# Patient Record
Sex: Male | Born: 1935 | Race: White | Hispanic: No | Marital: Married | Smoking: Former smoker
Health system: Southern US, Community
[De-identification: ages and names within clinical notes are randomized; demographics above are authoritative.]

## PROBLEM LIST (undated history)

## (undated) DIAGNOSIS — C4490 Unspecified malignant neoplasm of skin, unspecified: Secondary | ICD-10-CM

## (undated) DIAGNOSIS — E782 Mixed hyperlipidemia: Secondary | ICD-10-CM

## (undated) DIAGNOSIS — E7800 Pure hypercholesterolemia, unspecified: Secondary | ICD-10-CM

## (undated) DIAGNOSIS — Z9289 Personal history of other medical treatment: Secondary | ICD-10-CM

## (undated) DIAGNOSIS — M1990 Unspecified osteoarthritis, unspecified site: Secondary | ICD-10-CM

## (undated) DIAGNOSIS — M109 Gout, unspecified: Secondary | ICD-10-CM

## (undated) DIAGNOSIS — I779 Disorder of arteries and arterioles, unspecified: Secondary | ICD-10-CM

## (undated) DIAGNOSIS — I10 Essential (primary) hypertension: Secondary | ICD-10-CM

## (undated) DIAGNOSIS — I219 Acute myocardial infarction, unspecified: Secondary | ICD-10-CM

## (undated) DIAGNOSIS — I739 Peripheral vascular disease, unspecified: Secondary | ICD-10-CM

## (undated) DIAGNOSIS — K219 Gastro-esophageal reflux disease without esophagitis: Secondary | ICD-10-CM

## (undated) DIAGNOSIS — I2510 Atherosclerotic heart disease of native coronary artery without angina pectoris: Secondary | ICD-10-CM

## (undated) DIAGNOSIS — H9190 Unspecified hearing loss, unspecified ear: Secondary | ICD-10-CM

## (undated) DIAGNOSIS — R001 Bradycardia, unspecified: Secondary | ICD-10-CM

## (undated) HISTORY — DX: Essential (primary) hypertension: I10

## (undated) HISTORY — DX: Atherosclerotic heart disease of native coronary artery without angina pectoris: I25.10

## (undated) HISTORY — DX: Bradycardia, unspecified: R00.1

## (undated) HISTORY — DX: Mixed hyperlipidemia: E78.2

## (undated) HISTORY — DX: Peripheral vascular disease, unspecified: I73.9

## (undated) HISTORY — DX: Disorder of arteries and arterioles, unspecified: I77.9

## (undated) HISTORY — PX: APPENDECTOMY: SHX54

---

## 1999-11-07 ENCOUNTER — Encounter: Payer: Self-pay | Admitting: Cardiothoracic Surgery

## 1999-11-07 ENCOUNTER — Inpatient Hospital Stay (HOSPITAL_COMMUNITY): Admission: AD | Admit: 1999-11-07 | Discharge: 1999-11-14 | Payer: Self-pay | Admitting: Cardiology

## 1999-11-09 ENCOUNTER — Encounter: Payer: Self-pay | Admitting: Cardiothoracic Surgery

## 1999-11-09 HISTORY — PX: CORONARY ARTERY BYPASS GRAFT: SHX141

## 1999-11-10 ENCOUNTER — Encounter: Payer: Self-pay | Admitting: Cardiothoracic Surgery

## 1999-11-11 ENCOUNTER — Encounter: Payer: Self-pay | Admitting: Cardiothoracic Surgery

## 1999-11-12 ENCOUNTER — Encounter: Payer: Self-pay | Admitting: Cardiothoracic Surgery

## 2005-04-26 ENCOUNTER — Ambulatory Visit: Payer: Self-pay | Admitting: Cardiology

## 2005-04-30 ENCOUNTER — Ambulatory Visit: Payer: Self-pay | Admitting: *Deleted

## 2005-05-13 ENCOUNTER — Ambulatory Visit: Payer: Self-pay | Admitting: Cardiology

## 2005-05-18 ENCOUNTER — Encounter: Payer: Self-pay | Admitting: Cardiology

## 2005-06-16 ENCOUNTER — Ambulatory Visit: Payer: Self-pay | Admitting: Cardiology

## 2006-07-01 ENCOUNTER — Ambulatory Visit: Payer: Self-pay | Admitting: Cardiology

## 2007-01-04 ENCOUNTER — Ambulatory Visit: Payer: Self-pay | Admitting: Cardiology

## 2007-11-14 ENCOUNTER — Encounter: Payer: Self-pay | Admitting: Cardiology

## 2007-11-20 ENCOUNTER — Encounter: Payer: Self-pay | Admitting: Physician Assistant

## 2007-11-20 ENCOUNTER — Ambulatory Visit: Payer: Self-pay | Admitting: Cardiology

## 2008-01-08 ENCOUNTER — Ambulatory Visit: Payer: Self-pay

## 2008-10-10 ENCOUNTER — Ambulatory Visit: Payer: Self-pay | Admitting: Cardiology

## 2008-10-10 ENCOUNTER — Encounter: Payer: Self-pay | Admitting: Physician Assistant

## 2008-10-17 ENCOUNTER — Encounter: Payer: Self-pay | Admitting: Cardiology

## 2008-10-17 ENCOUNTER — Ambulatory Visit: Payer: Self-pay | Admitting: Cardiology

## 2008-10-21 ENCOUNTER — Encounter: Payer: Self-pay | Admitting: Physician Assistant

## 2008-10-22 ENCOUNTER — Encounter: Payer: Self-pay | Admitting: Cardiology

## 2008-11-01 ENCOUNTER — Encounter: Payer: Self-pay | Admitting: Cardiology

## 2008-11-12 ENCOUNTER — Ambulatory Visit: Payer: Self-pay | Admitting: Cardiology

## 2008-11-25 ENCOUNTER — Encounter: Payer: Self-pay | Admitting: Physician Assistant

## 2009-02-24 ENCOUNTER — Encounter: Payer: Self-pay | Admitting: Cardiology

## 2009-03-05 DIAGNOSIS — I739 Peripheral vascular disease, unspecified: Secondary | ICD-10-CM | POA: Insufficient documentation

## 2009-03-05 DIAGNOSIS — I10 Essential (primary) hypertension: Secondary | ICD-10-CM | POA: Insufficient documentation

## 2009-03-05 DIAGNOSIS — E782 Mixed hyperlipidemia: Secondary | ICD-10-CM | POA: Insufficient documentation

## 2009-03-05 DIAGNOSIS — I2510 Atherosclerotic heart disease of native coronary artery without angina pectoris: Secondary | ICD-10-CM | POA: Insufficient documentation

## 2009-12-03 ENCOUNTER — Ambulatory Visit: Payer: Self-pay | Admitting: Cardiology

## 2009-12-03 DIAGNOSIS — R42 Dizziness and giddiness: Secondary | ICD-10-CM | POA: Insufficient documentation

## 2009-12-15 ENCOUNTER — Encounter: Payer: Self-pay | Admitting: Cardiology

## 2010-07-07 NOTE — Assessment & Plan Note (Signed)
Summary: 1 YR FU JUNE REMINDER-SRS   Visit Type:  Follow-up Primary Provider:  Woody Seller   History of Present Illness: the patient is a 76 year old male with history of coronary disease, status post coronary bypass grafting in 2001. Last year the patient had a stress test and had a fixed defect. There was no ischemia. Ejection fraction was 59%. The patient has been doing well. He denies any chest pain or shortness of breath. The patient does report dizziness when standing up quickly. It also an episode 6 weeks ago when he was working up and pulling out some plywood. He didn't eat breakfast that morning he felt dizzy and sick on his stomach. He felt like he was going to pass out. It was very hot that day he developed some nausea. After sat rested and drank something his symptoms resolve. Otherwise from a cardiovascular standpoint is stable. He denies any orthopnea PND or palpitations  Preventive Screening-Counseling & Management  Alcohol-Tobacco     Smoking Status: quit     Year Quit: 2001  Current Medications (verified): 1)  Isosorbide Mononitrate Cr 30 Mg Xr24h-Tab (Isosorbide Mononitrate) .... Take 1 Tablet By Mouth Once A Day 2)  Lisinopril 20 Mg Tabs (Lisinopril) .... One Tab Twice A Day Place On File, Pt. Will Finish Current Supply Then Request Refill. 3)  Simvastatin 40 Mg Tabs (Simvastatin) .... Take 1 Tablet By Mouth Every Night 4)  Nitrostat 0.4 Mg Subl (Nitroglycerin) .... Place One Tablet Under Tongue Every 5 Minutes For Severe Chest Pain, Not To Exceed 3 in 15 Min Time Frame 5)  Lisinopril 40 Mg Tabs (Lisinopril) .... Take 1 Tablet By Mouth Once A Day 6)  Aspir-Low 81 Mg Tbec (Aspirin) .... Take 1 Tablet By Mouth Once A Day  Allergies (verified): No Known Drug Allergies  Comments:  Nurse/Medical Assistant: The patient's medications and allergies were verballyreviewed with the patient and were updated in the Medication and Allergy Lists.  Past History:  Past Medical  History: Last updated: 03/05/2009 HYPERLIPIDEMIA-MIXED (ICD-272.4) HYPERTENSION, UNSPECIFIED (ICD-401.9) PVD (ICD-443.9) CAD, NATIVE VESSEL (ICD-414.01)    Social History: Last updated: 03/05/2009 Retired  Married  Tobacco Use - Former.   Review of Systems       The patient complains of dizziness.  The patient denies fatigue, malaise, fever, weight gain/loss, vision loss, decreased hearing, hoarseness, chest pain, palpitations, shortness of breath, prolonged cough, wheezing, sleep apnea, coughing up blood, abdominal pain, blood in stool, nausea, vomiting, diarrhea, heartburn, incontinence, blood in urine, muscle weakness, joint pain, leg swelling, rash, skin lesions, headache, fainting, depression, anxiety, enlarged lymph nodes, easy bruising or bleeding, and environmental allergies.    Vital Signs:  Patient profile:   76 year old male Height:      68 inches Weight:      146 pounds BMI:     22.28 Pulse rate:   52 / minute Pulse (ortho):   61 / minute BP sitting:   119 / 53  (left arm) BP standing:   120 / 62 Cuff size:   regular  Vitals Entered By: Georgina Peer (December 03, 2009 1:53 PM)  Serial Vital Signs/Assessments:  Time      Position  BP       Pulse  Resp  Temp     By 2:57 PM   Lying RA  111/55   58                    Lovina Reach, LPN [id number] PM  Sitting   111/54   42                    [address] LPN [id number] PM   Standing  120/62   61                    [place], LPN  Comments: [id number] PM after 2 min:  117/62  61 after 5 min:  117/64  64 By: Lovina Reach, LPN    Physical Exam  Additional Exam:  General: Well-developed, well-nourished in no distress head: Normocephalic and atraumatic eyes PERRLA/EOMI intact, conjunctiva and lids normal nose: No deformity or lesions mouth normal dentition, normal posterior pharynx neck: Supple, no JVD.  No masses, thyromegaly or abnormal cervical nodes. Right carotid bruit. lungs: Normal breath sounds bilaterally without wheezing.   Normal percussion heart: regular rate and rhythm with normal S1 and S2, no S3 or S4.  PMI is normal.  No pathological murmurs abdomen: Normal bowel sounds, abdomen is soft and nontender without masses, organomegaly or hernias noted.  No hepatosplenomegaly musculoskeletal: Back normal, normal gait muscle strength and tone normal pulsus: Pulse is normal in all 4 extremities Extremities: No peripheral pitting edema neurologic: Alert and oriented x 3 skin: Intact without lesions or rashes cervical nodes: No significant adenopathy psychologic: Normal affect    Impression & Recommendations:  Problem # 1:  CAD, NATIVE VESSEL (ICD-414.01) no recurrent chest pain. Patient was stable regimen. No indication for stress testing currently His updated medication list for this problem includes:    Isosorbide Mononitrate Cr 30 Mg Xr24h-tab (Isosorbide mononitrate) .Marland Kitchen... Take 1 tablet by mouth once a day    Lisinopril 20 Mg Tabs (Lisinopril) ..... One tab twice a day place on file, pt. will finish current supply then request refill.    Nitrostat 0.4 Mg Subl (Nitroglycerin) .Marland Kitchen... Place one tablet under tongue every 5 minutes for severe chest pain, not to exceed 3 in 15 min time frame    Lisinopril 40 Mg Tabs (Lisinopril) .Marland Kitchen... Take 1 tablet by mouth once a day    Aspir-low 81 Mg Tbec (Aspirin) .Marland Kitchen... Take 1 tablet by mouth once a day  Orders: EKG w/ Interpretation (93000)  Problem # 2:  PVD (ICD-443.9) patient is a right carotid bruit. He had thought was a year ago.  Problem # 3:  HYPERLIPIDEMIA-MIXED (ICD-272.4)  patient will need a lipid panel and LFTs. The following medications were removed from the medication list:    Crestor 10 Mg Tabs (Rosuvastatin calcium) .Marland Kitchen... 1 tablet by mouth once a day His updated medication list for this problem includes:    Simvastatin 40 Mg Tabs (Simvastatin) .Marland Kitchen... Take 1 tablet by mouth every night  Orders: T-Lipid Profile [id number]) T-Hepatic Function  [id number])  Problem # 4:  ORTHOSTATIC DIZZINESS (ICD-780.4) we'll check orthostatics in the office today. I did encourage the patient to drink more fluids.  Patient Instructions: 1)  Labs:  FLP & LFT 2)  Follow up in  1 year

## 2010-10-20 NOTE — Assessment & Plan Note (Signed)
[place] OFFICE NOTE   [name], [name]                        MRN:          [id number]  DATE:11/20/2007                            DOB:          [birth year]    PRIMARY CARDIOLOGIST:  Ernestine Mcmurray, MD, [facility]   REASON FOR VISIT:  One-year followup.   Billy [name] continues to do well since undergoing multivessel CABG in  2001.  Moreover, he has not had any further exertional chest discomfort,  since placed on isosorbide mononitrate, by Dr. Dannielle Burn.  Although, he had  been taking only 15 mg of this, secondary to significant headaches, he  just increased it to 30 mg 1 week ago.  He states that he is tolerating  this quite well, with no recurrent headaches.   The patient remains quite active outside of his home.  In addition to  not experiencing any exertional chest discomfort, he also denies any  intermittent claudication.   The patient has not smoked since undergoing CABG surgery.   Most recent lipid profile in March 2009 revealed total cholesterol 135,  triglyceride 145, HDL 24, and LDL 82, the latter previously 89.   Electrocardiogram today reveals NSR at 64 bpm with normal axis and no  ischemic changes.   MEDICATIONS:  1. Full-dose aspirin.  2. Atenolol 25 mg daily.  3. Lisinopril 20 mg daily.  4. Isosorbide 30 mg daily.  5. Crestor 10 mg daily.  6. Diclofenac 75 mg daily.  7. KCl 20 mEq daily.  8. Fish oil 1 tablet daily.   PHYSICAL EXAMINATION:  VITAL SIGNS:  Blood pressure 138/65, pulse 54 and  regular, and weight 143.  GENERAL:  A 76 year old male, sitting upright, in no distress.  HEENT:  Normocephalic and atraumatic.  NECK:  Palpable bilateral carotid pulses; bilateral bruits.  LUNGS:  Clear to auscultation in all fields.  HEART:  Regular rate and rhythm (S1,S2).  No significant murmurs.  ABDOMEN:  Soft, nontender with positive bowel sounds.  There are  bilateral upper quadrant and  midepigastric bruits.  EXTREMITIES:  Palpable peripheral pulses without edema.  NEURO:  No focal deficit.   IMPRESSION:  1. Multivessel coronary artery disease.      a.     Five-vessel coronary artery bypass graft in June 2001:  Left       internal mammary artery - left anterior descending; saphenous vein       graft - DX; saphenous vein graft-obtuse marginal artery 1-obtuse,       marginal artery 2; saphenous vein graft-posterior descending       artery [facility]).      b.     Status post non-ST-elevation myocardial infarction.      c.     Preserved left ventricular ejection fraction.      d.     Mild inferior wall partial reversibility; ejection fraction       50%, by adenosine Cardiolite, November 2006.  2. Nonobstructive cerebrovascular disease.  3. Hypertension, stable.  4. Asymptomatic sinus bradycardia.  5. History of tobacco.  6. Mixed dyslipidemia.  7. Probable peripheral vascular disease.      a.     Diffuse abdominal bruits.   PLAN:  1. Abdominal and renal ultrasound with Dopplers, to exclude abdominal      aortic aneurysm and/or renal artery stenosis.  These studies will      be scheduled in our [facility]  2. Increase fish oil to 1 tablet twice daily, for more aggressive      management of mixed dyslipidemia.  3. Followup fasting lipid/liver profile in 12 weeks.  4. Increase aspirin initially to 162 mg daily, then 81 mg      indefinitely.  5. Schedule return clinic followup with myself and Dr. Dannielle Burn in 6      months.  We will consider a surveillance stress perfusion imaging      study at that time, given that it will be nearly 3 years since his      previous study.  We may also consider repeating carotid Dopplers,      as well.      Billy Serpe, PA-C  Electronically Signed      Ernestine Mcmurray, MD,FACC  Electronically Signed   GS/MedQ  DD: 11/20/2007  DT: 11/21/2007  Job #: [id number]   cc:   Jerene Bears, MD

## 2010-10-20 NOTE — Assessment & Plan Note (Signed)
[place] OFFICE NOTE   LONNEL, Billy [name]                        MRN:          [id number]  DATE:11/12/2008                            DOB:          September 19, 1934    REFERRING PHYSICIAN:  Jerene Bears, MD   HISTORY OF PRESENT ILLNESS:  The patient is a 76 year old male with  history of coronary artery disease, status post coronary bypass grafting  and peripheral arterial disease.  The patient underwent stress testing  recently which demonstrated predominantly fixed inferior defect.  There  was only very small amount of peri-infarct ischemia.   IDENTIFICATION:  Ejection fraction was 59%.  The patient is asymptomatic  and denies any chest pain.  This was a surveillance.  Nuclear study  ordered by Mr. Mannie Stabile.  The patient also had carotid Dopplers done  which shows less than 50% bilateral stenosis.  The patient's lipid panel  was also reviewed which showed an LDL of 92, HDL of 22, and cholesterol  of 132 with triglyceride markedly improved to 92 after fish oil  administration.  The patient states that he is asymptomatic.  He has no  chest pain, shortness of breath, orthopnea, PND, palpitations, or  syncope.   MEDICATIONS:  1. Multivitamin 1 tablet p.o. daily.  2. Atenolol 25 mg p.o. daily.  3. Lisinopril 20 mg p.o. b.i.d.  4. Isosorbide 30 mg p.o. daily.  5. Aspirin 81 mg p.o. daily.  6. Simvastatin 40 mg p.o. daily.  7. Fish oil 1 g p.o. b.i.d.  8. Saw palmetto 450 mg 2 tablets p.o. b.i.d.   PHYSICAL EXAMINATION:  VITAL SIGNS:  Blood pressure 165/69, heart rate  46, weight is 148 pounds.  NECK:  Normal carotid upstroke and no carotid bruits.  LUNGS:  Clear breath sounds bilaterally.  HEART:  Regular rate and rhythm with normal S1 and S2.  No murmurs,  rubs, or gallops.  ABDOMEN:  Soft, nontender.  No rebound or guarding.  Good bowel sounds.  EXTREMITIES:  No cyanosis, clubbing, or edema.  NEURO:  The  patient is alert, oriented, and grossly nonfocal.   PROBLEM LIST:  1. Multivessel coronary artery disease.      a.     Status post non-STEMI/5-vessel coronary artery bypass graft       in 2001.      b.     Preserved left ventricular function.      c.     Recent nuclear perfusion study with a inferior defect, but       no definite ischemia.  Ejection fraction preserved.  2. Peripheral arterial disease without claudication.  3. Hypertension, controlled.  4. Mixed dyslipidemia.  5. History of tobacco use.   PLAN:  1. The patient is doing well.  We encouraged again him to stop      smoking.  2. The patient will follow up liver function test done in several      weeks.  3. From a cardiovascular standpoint, the patient appears to be stable,      and I made otherwise no changes  in medical regimen.     Ernestine Mcmurray, MD,FACC  Electronically Signed    GED/MedQ  DD: 11/12/2008  DT: 11/13/2008  Job #: [id number]   cc:   Jerene Bears, MD

## 2010-10-20 NOTE — Assessment & Plan Note (Signed)
[facility]                          [facility] OFFICE NOTE   JOEVANI, MATCZAK                        MRN:          [id number]  DATE:10/10/2008                            DOB:          1934-11-17    PRIMARY CARDIOLOGIST:  Ernestine Mcmurray, MD, [facility]   REASON FOR VISIT:  A 4-month followup.   HISTORY OF PRESENT ILLNESS:  Mr. Dimasi continues to do well, since  undergoing multivessel CABG 2001.  Moreover, he has not had any further  exertional chest discomfort, since being placed on isosorbide  mononitrate, by Dr. Dannielle Burn, over 2 years ago.   Mr. Pearson remains quite active, playing golf some 3 times a week.  He  denies any associated chest discomfort.   Mr. Melrose has not smoked since undergoing bypass surgery.   The patient does have known peripheral vascular disease and does suggest  some mild intermittent claudication, but this is not lifestyle limiting.  When I last saw him in the clinic, I referred him for surveillance  ultrasound of the abdomen and the renal arteries.  This was completed in  our [facility], and did suggest a small infrarenal AAA (2.85 x  2.5 cm) with moderate amount of plaque.  There was also moderate  bilateral common iliac disease, and less than 59% left renal artery  stenosis, with no significant obstruction on the right renal artery.   The patient also has previously documented mild carotid artery disease,  last studied in 2006.   MEDICATIONS:  1. Aspirin 81 daily.  2. Simvastatin 40 at bedtime.  3. Fish oil 1 g b.i.d.  4. Isosorbide mononitrate 30 daily.  5. Lisinopril 20 daily.  6. Potassium chloride 20 daily.  7. Diclofenac 75 daily.  8. Atenolol 25 daily.   PHYSICAL EXAMINATION:  VITAL SIGNS:  Blood pressure 178/74, pulse 66,  regular and weight 150.  GENERAL:  A 76 year old male sitting upright, in no distress.  HEENT:  Normocephalic and atraumatic.  NECK:  Palpable bilateral carotid pulses with  bilateral bruits (right  greater left).  LUNGS:  Clear to auscultation all fields.  HEART:  Regular rate rhythm.  No significant murmurs.  ABDOMEN:  Soft, nontender with intact bowel sounds.  Diffuse bruits,  including both upper quadrants.  EXTREMITIES:  Palpable bilateral femoral pulses with bilateral bruits;  brisk bilateral posterior tibialis pulses.  No pedal edema.  NEUROLOGIC:  No focal deficit.   IMPRESSION:  1. Multivessel coronary artery disease.      a.     Status post NSTEMI/five-vessel CABG, June 2001.      b.     Preserved left ventricular function.  2. Peripheral arterial disease.  3. Hypertension, controlled.  4. Mixed dyslipidemia.  5. History of tobacco.   PLAN:  1. Schedule surveillance carotid Dopplers to rule out significant      progression, since his last study in 2006.  2. Schedule surveillance exercise stress Cardiolite, on medication, to      rule out significant progression since his previous study in 2006.      Of note, at  that time there was evidence of a partially reversible      defect in the inferior wall, which was felt to be mild.  3. Reassess lipid status with a fasting lipid/liver profile.  His last      study in June 2009 notable for an LDL of 86, HDL 22, triglyceride      258, and total cholesterol 160.  4. Increase lisinopril to 20 mg b.i.d., for more aggressive blood      pressure control.  5. Followup BMET in 1 week for monitoring of potassium and renal      function.  6. Renew prescription for p.r.n. Nitrostat.  7. Schedule return clinic follow up with myself and Dr. Dannielle Burn in one      month, for review of study results and further recommendations.  At      that time, we can then schedule for an annual abdominal ultrasound      with renal Dopplers, as recommended.      Gene Serpe, PA-C  Electronically Signed      Ernestine Mcmurray, MD,FACC  Electronically Signed   GS/MedQ  DD: 10/10/2008  DT: 10/11/2008  Job #: [id number]   cc:    Jerene Bears, MD

## 2010-10-20 NOTE — Assessment & Plan Note (Signed)
[place] OFFICE NOTE   PURNELL, HIJAZI                        MRN:          [id number]  DATE:01/04/2007                            DOB:          1934/07/08    HISTORY OF PRESENT ILLNESS:  The patient is a 76 year old male with  history of coronary artery disease, status post coronary artery bypass  grafting.  The patient reports chronic stable angina.  We placed him on  Imdur 30 mg last office visit; he has not been able to tolerate this  because of headaches; he cut it down to 15 mg, but states that it has  helped significantly and has not had to take any nitroglycerin.  The  patient states that he has some symptoms of orthostasis, but only when  he jumps up quickly out of a chair or from a bending position.  He has  no orthopnea or PND.   MEDICATIONS:  1. Saw palmetto.  2. Multivitamin.  3. Aspirin 325 mg daily.  4. Atenolol 25 mg p.o. daily.  5. Cyclobenzaprine 10 mg p.o. daily.  6. Isosorbide half a tablet 15 mg p.o. daily.  7. Lisinopril 20 mg p.o. daily.   PHYSICAL EXAMINATION:  VITAL SIGNS:  Blood pressure is 126/69.  Heart  rate is 51.  GENERAL:  A well-nourished white male in no apparent distress.  NECK:  Normal carotid upstroke.  No carotid bruit.  LUNGS:  Clear breath sounds bilaterally.  HEART:  Regular rate and rhythm.  Normal S1 and S2.  No murmurs, rubs,  or gallops.  ABDOMEN:  Soft and nontender.  EXTREMITIES:  No cyanosis, clubbing or edema.  NEUROLOGIC:  The patient is alert and oriented.  Grossly nonfocal.   PROBLEMS:  1. Coronary artery disease, status post coronary artery bypass      grafting in 2001.  2. Hypertension, controlled.  3. Dyslipidemia, patient off Crestor.  LDL approximately 100 mg%.  4. Asymptomatic carotid disease, less than 39% bilaterally.   PLAN:  1. The patient will need a stress test in approximately 1 year.  2. Followup carotid Dopplers can be  provided in 2 years.  3. The patient will have his lipid panel rechecked.  He stated that      this was done more recently.  I will review in the computer to see      if we have any more recent data.     Ernestine Mcmurray, MD,FACC  Electronically Signed    GED/MedQ  DD: 01/04/2007  DT: 01/05/2007  Job #: [id number]

## 2010-10-23 NOTE — Cardiovascular Report (Signed)
[place]  Patient:    Billy [name], Billy [name]                        MRN: [id number] Proc. Date: 11/06/99 Adm. Date:  [id number] Disc. Date: [id number] Attending:  Len Childs CC:         Carlena Bjornstad, M.D. [facility]                        Cardiac Catheterization  DATE OF BIRTH:  [birth year]  PROCEDURE:  Left heart catheterization/coronary arteriography.  CARDIOLOGIST:  Minus Breeding, M.D. [facility]  INDICATION:  Evaluate patient with known three-vessel coronary artery disease and ongoing chest discomfort (786.51).  PROCEDURAL NOTE:  Left heart catheterization was performed via the right femoral artery.  The artery was cannulated using anterior wall puncture.  A #6 French arterial sheath was inserted via the modified Seldinger technique. Preform Judkins and a pigtail catheter were utilized.  The patient tolerated the procedure well and left the lab in stable condition.  RESULTS:  HEMODYNAMICS:  LV: 117/10, AO: 113/58.  CORONARIES:  left main was normal.  The LAD had a proximal calcification before a large first diagonal.  There was proximal 75% stenosis after the first diagonal.  There was mid 60% stenosis.  There was 80% stenosis in the first septal perforator and 50% stenosis proximally in the large first diagonal.  The circumflex was occluded in the mid A-V groove.  There was occlusion at the ostium of the OM-1, occlusion at the ostium of the OM-2, and occlusion at the ostium of the OM-3.  All of these vessels filled with collaterals from the LAD.  The right coronary artery was a large dominant vessel.  There was mid, long, 50% stenosis with severe mid calcification.  LEFT VENTRICULOGRAM:  A left ventriculogram was obtained in the RAO projection.  The EF was 65% with normal wall motion.  CONCLUSION:  Severe two-vessel coronary artery disease with severe calcification.  Well preserved left ventricular function.  PLAN:  The patient will be  referred for CABG. DD:  12/23/99 TD:  12/24/99 Job: [id number]

## 2010-10-23 NOTE — Assessment & Plan Note (Signed)
[place] OFFICE NOTE   Billy [name], Billy [name]                        MRN:          [id number]  DATE:07/01/2006                            DOB:          Oct 17, 1934    HISTORY OF PRESENT ILLNESS:  The patient is a 76 year old male with  history of coronary artery disease, status post coronary bypass  grafting.  The patient has been doing well and reports rare episodes of  substernal chest pain over the last year.  He has used a couple of  nitroglycerins.  The patient actually is still very active and walks  quite a bit.  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  He is questioning whether he should discontinue Zetia today.  He does  have reports of muscle aches and pains with prior Zocor use.  He denies  any palpitations or syncope.  From a cardiovascular standpoint he is  otherwise stable.   CURRENT MEDICATIONS:  1. Zetia 10 mg a day.  2. Atenolol 25 mg a day.  3. Lisinopril 10 mg a day.  4. Lycopene and multivitamin.  5. Aspirin 325 mg p.o. a day.   PHYSICAL EXAMINATION:  VITAL SIGNS:  Blood pressure 150/68, heart rate  60 beats per minute, 179 pounds.  NECK:  Normal carotid upstroke, no carotid bruits.  LUNGS:  Clear, breath sounds bilaterally.  HEART:  Regular rate and rhythm, normal S1, S2, no murmur, rubs, or  gallops.  ABDOMEN:  Soft, nontender, no rebound or guarding, good bowel sounds.  EXTREMITIES:  No cyanosis, clubbing, or edema.   PROBLEMS:  1. Coronary artery disease, status post coronary artery bypass      grafting.  2. Hypertension, controlled.  3. Dyslipidemia.  4. Asymptomatic carotid disease, less than 39% bilaterally with last      Dopplers done in 2006.   PLAN:  1. The patient had a stress test done a year ago.  We do not need to      repeat this, he appears to have chronic stable angina.  I  did write      him for a prescription of Imdur 30 mg a day.  2. The patient is willing to try a different brand of statin, and we      will start him on Crestor.  He      will notify me if he has myalgias.  He does want to stop Zetia, and      I agreed with this plan.  3. The patient can follow up with [place] in 6 months.     Ernestine Mcmurray, MD,FACC  Electronically Signed    GED/MedQ  DD: 07/01/2006  DT: 07/01/2006  Job #: [id number]   cc:   Jerene Bears

## 2010-10-23 NOTE — Discharge Summary (Signed)
[place]. [facility]  Patient:    Billy [name], Billy [name]                        MRN: [id number] Adm. Date:  [id number] Disc. Date: 11/14/99 Attending:  Len Childs Dictator:   Earnstine Regal, P.A. CC:         Tharon Aquas Adelene Idler, M.D.             Carlena Bjornstad, M.D. [facility]                           Discharge Summary  DATE OF BIRTH:  11/25/1934  ADMISSION DIAGNOSES: 1. Known three-vessel coronary artery disease with recurrent chest pain. 2. Hypertension. 3. History of chronic obstructive pulmonary disease with heavy tobacco use. 4. Mild alcohol use. 5. Hyperlipidemia.  DISCHARGE DIAGNOSES: 1. Known three-vessel coronary artery disease with recurrent chest pain. 2. Hypertension. 3. History of chronic obstructive pulmonary disease with heavy tobacco use. 4. Mild alcohol use. 5. Hyperlipidemia. 6. Postoperative anemia secondary to acute blood loss during surgery.  PROCEDURES: 1. Cardiac catheterization November 06, 1999 by Dr. Percival Spanish. 2. Coronary artery bypass grafting x 5 with a left internal mammary to the    LAD, saphenous vein graft to posterior descending, saphenous vein graft to    OM1 and OM2 sequentially, saphenous vein graft to the diagonal November 09, 1999    by Dr. Prescott Gum.  BRIEF HISTORY:  The patient is a 76 year old white male medical patient of Dr. Dola Argyle and he sees him in the [place] clinic.  The patient is known to have three-vessel coronary artery disease from a heart catheterization January 21, 1993.  At that time, the patient had a 40-50% LAD.  Diagonal had a 30% stenosis, a 60% proximal diagonal, 40% proximal circumflex.  There was a 60% mid RCA stenosis.  Left main was normal.  There was segmental wall motion abnormality.  The patient was treated medically.  He had a recurrent study on Oct 15, 1999, which was negative for ischemia.  He returned to the [facility] clinic with a two- to three-week history of sudden onset of chest pain  in the mornings upon awakening, sometimes about 15 minutes after he gets up.  The pain was relieved with nitroglycerin.  He becomes mildly diaphoretic and mildly short of breath with the episodes.  He was seen by Dr. Ron Parker and Roque Cash, P.A.  EKG showed a heart rate at 53 with minimal ST elevation and nonspecific changes in the inferior leads.  He was subsequently admitted at this time for elective cardiac catheterization to evaluate his disease.  MEDICATIONS ON ADMISSION: 1. Nitroglycerin patch 0.4 mg q.d. 2. Norvasc 5 mg q.d. 3. Atenolol 25 mg 1/2 tablet q.d. 4. Enteric-coated aspirin 1 q.d. 5. Zocor 20 mg q.d.  For further history and physical, please see the dictated note.  HOSPITAL COURSE:  The patient was admitted on November 06, 1999 and underwent cardiac catheterization.  The LAD showed proximal calcification before the large first diagonal, which was 75%.  The circumflex had a 100% stenosis. OM1, 2, and 3 had 100% stenosis.  The RCA was dominant with a 50% stenosis. The patient was referred to CVTS and Dr. Tharon Aquas Trigt.  He saw the patient in consultation and agreed that coronary artery bypass grafting was the best treatment for his disease.  Preoperative studies showed no evidence  of ICA stenosis.  Vertebral artery flow was antegrade.  ABIs were greater than 1 bilaterally.  On November 09, 1999, he was taken to the operating room and underwent the procedure described above.  He tolerated it well.  He was transferred to the floor on the second postoperative day.  He was markedly anemic after his surgery with his hematocrit down to 22 and he was transfused with 1 unit of packed cells.  Since then, he showed good improvement.  He has undergone Phase I cardiac rehab and is ambulating 450 feet without difficulty. Saturations 96 and a heart rate up to 76 with walking.  Today, he is afebrile. His wounds look good.  He remains in a sinus rhythm.  Heart rate averages in the mid 50s.   Chest reveals a few rales in the bases but overall is doing well.  Sternum is stable.  We plan to discharge the patient home today.  DISCHARGE MEDICATIONS: 1. Coated aspirin 1 q.d. 2. Darvocet-N 100 1-2 p.o. q.4-6h. p.r.n. 3. Lopressor 50 mg 1/2 tablet q.12h. 4. Niferex 150 mg 1 q.d. 5. The patient may also resume his Zocor 20 mg q.d.  FOLLOW-UP:  He will return to see Dr. Ron Parker in two weeks on November 24, 1999 at 11:30 in the [facility].  He will return to see Dr. Prescott Gum in three weeks and our office will call and make that appointment.  DISCHARGE LABORATORY DATA:  His last hemoglobin on November 12, 1999 was 9.7 with a hematocrit of 27 and a white count of 8.9.  Platelets were 227,000. Electrolytes are normal.  BUN is 23, creatinine is 1.1, calcium 8.4.  His highest glucose was 189 and they are following this is in [place].  TSH was reported elevated initially but this was corrected and TSH level was 2.65.  CONDITION ON DISCHARGE:  Improved. DD:  11/14/99 TD:  11/14/99 Job: [id number]

## 2010-10-23 NOTE — Op Note (Signed)
[place]. [facility]  Patient:    Billy [name], Billy [name]                        MRN: [id number] Proc. Date: 11/09/99 Adm. Date:  [id number] Attending:  Len Childs CC:         CVTS Office             Bruce R. Olevia Perches, M.D. [facility]                           Operative Report  OPERATION:  Coronary artery bypass grafting x 5 (left internal mammary artery to left anterior descending, saphenous vein graft to diagonal, sequential saphenous graft to obtuse marginal 1 and obtuse marginal 2, saphenous vein graft to posterior descending).  PREOPERATIVE DIAGNOSES:  Class 4 unstable angina with non-Q-wave myocardial infarction, severe three-vessel coronary artery disease.  POSTOPERATIVE DIAGNOSES:  Class 4 unstable angina with non-Q-wave myocardial infarction, severe three-vessel coronary artery disease.  SURGEON:  Len Childs, M.D.  ASSISTANT:  Marcellus Scott, P.A.  ANESTHESIA:  General.  INDICATIONS:  The patient is a 76 year old male with known history of coronary disease, who presents with symptoms of unstable angina and ruled in with mildly positive myocardial enzymes.  He was stabilized on nitroglycerin patch and IV heparin and underwent cardiac catheterization, which demonstrated severe three-vessel coronary artery disease.  He was referred for surgical revascularization.  Prior to the operation, I examined the patient in his hospital room and discussed the results of the heart catheterization with the patient and wife.  I reviewed the indications and expected benefits of coronary bypass grafting.  I reviewed the alternatives to surgical therapy.  I discussed the major aspects of the operation, including the location of the surgical incisions, the use of cardiopulmonary bypass and general anesthesia, and expected hospital recovery.  I also discussed the risks associated with this operation, including the risks of MI, CVA, bleeding, infection, and death.  He  understood these implications for surgery and agreed to proceed with the operation as planned under informed consent.  OPERATIVE FINDINGS:  The saphenous vein was of average quality.  The mammary artery was small, approximately 1.5 mm, but had adequate flow.  Coronaries were severely diseased.  The ascending aorta was heavily diseased and calcified when the arch was cannulated.  The grafts were placed in a small central area of the ascending aorta that was without severe atherosclerosis. Any redo procedure would be at high risk due to the disease of the ascending aorta.  The distal circumflex vessel was too small to graft.  DESCRIPTION OF PROCEDURE:  The patient was brought to the operating room and placed supine on the operating room table, where general anesthesia was induced.  The chest, abdomen, and legs were prepped with Betadine and draped as a sterile field.  A median sternotomy was performed as the saphenous vein was harvested from the right lower extremity.  The left internal mammary artery was harvested as a pedicle graft from its origin at the subclavian vessels and was an adequate conduit.  Heparin was administered, and the ACT was documented as being therapeutic.  A pursestring was placed in the aortic arch and right atrium.  The patient was cannulated and placed on bypass and cooled to 32 degrees.  The coronaries were identified, and the mammary artery and saphenous veins were prepared for the coronary anastomoses.  A cardioplegia  cannula was placed, and his aortic crossclamp was carefully applied on a disease-free segment of the aorta, 500 cc of cold blood cardioplegia was delivered into the aortic root with immediate cardioplegic arrest and septal temperature dropping to less than 12 degrees.  Topical iced saline flush was used to augment myocardial preservation, and a pericardial insulator pad was used to protect the left phrenic nerve.  The distal coronary anastomoses  were then performed.  The first distal anastomosis was to the diagonal.  This is a 1.5 mm vessel, proximal 90% stenosis, and a reverse saphenous vein was sewn end-to-side with running 7-0 Prolene.  The second distal anastomosis was the sequential vein graft to the OM1 and OM2.  The OM1 was a 1.5 mm vessel with total proximal occlusion, and a side branch of the vein was sewn side-to-side with running 7-0 Prolene with good flow through the graft.  The third distal anastomosis was the continuation of the sequential vein graft to the OM2, which was a 1.2 mm vessel, proximal total occlusion.  The end of the vein was sewn end-to-side with running 7-0 Prolene, and there was a good flow through the sequential vein graft.  Cardioplegia was re-dosed.  The fourth distal anastomosis was to the posterior descending, which had a proximal 80-90% stenosis.  A reverse saphenous vein was sewn end-to-side with a running 7-0 Prolene, and there was a good flow through the graft.  The fifth distal anastomosis was to the mid-aspect of the LAD, which was diffusely diseased and was a 1.8 mm vessel with proximal 95% stenosis.  The left internal mammary artery pedicle was brought through an opening created in the left lateral pericardium, was brought down on the LAD, and sewn end-to-side with running 8-0 Prolene.  There was excellent flow through the anastomosis with immediate rise in septal temperature after release of the pedicle clamp on the mammary artery.  The mammary pedicle was secured to epicardium, and the aortic crossclamp was removed.  The heart was cardioverted back to a regular rhythm.  Very carefully, a partial occluding clamp was placed on the central portion of the ascending aorta that had no significant plaque.  Three proximal vein anastomoses were constructed using a 4.0 mm punch with running 6-0 Prolene.  The partial clamp was removed, and the vein grafts were inspected.  They were hemostatic  with excellent flow.  The patient was rewarmed to 37 degrees, and temporary pacing  wires were applied.  The lungs were re-expanded and the ventilator was turned back on.  The patient weaned from cardiopulmonary bypass without difficulty without inotropes.  Protamine was administered, and the cannula was removed. The mediastinum was irrigated with warm antibiotic irrigation, and hemostasis was adequate.  The leg incision was irrigated and closed in a standard fashion.  The pericardium was loosely reapproximated.  Two mediastinal and a left pleural chest tube were placed and brought out through separate incisions.  The sternum was reapproximated with interrupted steel wire, and the pectoralis and subcutaneous layers were closed with running Vicryl.  The skin was closed with staples, and sterile dressings were applied. Total cardiopulmonary bypass time was 120 minutes, with aortic crossclamp time of 60 minutes. DD:  11/09/99 TD:  11/12/99 Job: [id number]

## 2010-10-23 NOTE — Consult Note (Signed)
[place]. [facility]  Patient:    Billy [name], Billy [name]                        MRN: [id number] Proc. Date: 11/06/99 Adm. Date:  [id number] Attending:  Fatima Sanger CC:         [facility]                          Consultation Report  REASON FOR CONSULTATION:  Severe three-vessel coronary disease.  CHIEF COMPLAINT:  Chest pain.  HISTORY OF PRESENT ILLNESS:  Patient is a 76 year old white male with known history of coronary disease following cardiac catheterization at [facility] in 1994, who presents with progressive angina over the past two to three weeks which is described as typical squeezing in nature, associated at rest and with exertion.  It radiates across his chest to the left shoulder and is associated with some mild diaphoresis and shortness of breath.  The pain lasts typically 10 to 15 minutes and usually resolves with sublingual nitroglycerin.  Patient denies any symptoms of congestive heart failure, including orthopnea, PND or ankle edema.  He denies any history of syncope of palpitation.  The patient was evaluated in [facility] Emergency Room and was found to have mildly positive cardiac enzymes and was transferred to [facility].  He underwent cardiac catheterization today, which demonstrated severe three-vessel coronary disease with total occlusion of the circumflex, 90% stenosis of the LAD diagonal and 70% stenosis of the right coronary artery with well-preserved left ventricular function.  He was referred for surgical coronary revascularization due to his bad coronary anatomy and symptoms of unstable angina.  The patient is currently pain-free following cardiac catheterization and he remains on heparin protocol.  PAST MEDICAL HISTORY:  Patients history is positive for hypertension and peripheral vascular disease and known coronary artery disease.  He denies any prior surgical therapy.  MEDICATIONS ON  ADMISSION:  Nitroglycerin patch, Zocor, Tenormin, Norvasc and aspirin.  ALLERGIES:  He denies allergies to medications.  SOCIAL HISTORY:  The patient is retired and disabled from work at a [employer] in [place].  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  He is married and lives with his wife, Billy [name]. He does not currently smoke but does have a smoking history and he uses alcohol, approximately two to three drinks per two-week period.  FAMILY HISTORY:  Family history is without history of coronary artery disease or premature myocardial infarction.  REVIEW OF SYSTEMS:  Patient states his weight has been stable and he denies any fever, night sweats or significant weakness.  He denies any TIA, CVA, seizure or syncope.  His cardiac history is positive for angina but negative for symptoms of orthopnea or PND.  The pulmonary history is positive for a history of smoking but no smoking for four years and no productive cough, wheezing or history of recurrent upper respiratory infection.  His abdominal history is negative for blood per rectum, jaundice or gallstones.  His GU history is negative for hematuria or prostate symptoms.  Skin is negative for rash, lesion or history of free bleeding.  He denies any symptoms of depression, insomnia or decreased appetite.  Remainder of review of systems is negative.  PHYSICAL EXAMINATION:  GENERAL:  He is 5-feet 8-inches tall and weighs 148  pounds.  General appearance is that of a middle-aged white male in his hospital room following cardiac catheterization, without chest pain and without stress.  VITAL SIGNS:  His blood pressure is 110/57 with a pulse of 60 per minute, in sinus rhythm.  HEENT:  Full EOMs, clear pharynx, poor dentition.  NECK:  Supple without thyromegaly, JVD, carotid bruit or mass.  LUNGS:  Clear breath sounds bilaterally and there no thoracic deformities.  CARDIAC:  Regular rate  and rhythm with normal S1 and S2 without gallop or murmur.  ABDOMEN:  Soft and nontender, without organomegaly, pulsatile mass and with normal bowel sounds.  EXTREMITIES:  Good range of motion.  There is no lower extremity edema or evidence of venous insufficiency.  There are no tender swollen joints.  He does have some tenderness over his lower lumbar spine.  VASCULAR:  Pulses 2+ in radials and pedal pulses.  SKIN:  Clear without rash and his lower extremity hair distribution is normal.  NEUROLOGIC:  Alert and oriented x 3 with full motor function.  RECTAL:  Deferred.  LABORATORY DATA:  His coronary arteriograms are reviewed, as performed by Dr. Vanna Scotland. Brodie today, and reveal total occlusion of the circumflex with high-grade stenosis of the LAD diagonal and moderate stenosis of the right coronary artery.  IMPRESSION:  I agree with the recommendation for coronary revascularization due to his bad coronary anatomy and symptoms of unstable angina with mild bump in his cardiac enzymes.  We plan on grafting the left anterior descending diagonal, obtuse marginal #2 and obtuse marginal #3 and right coronary artery on Monday, November 09, 1999.  I discussed the plan and recommendation for surgery with the patient including the major aspects of the operation including the choice of conduit, the placement of surgical incisions and use of cardiopulmonary bypass and general anesthesia.  I reviewed the alternatives to surgical therapy as well as the risks involved.  The understands the expected hospital recovery and the followup care that will be required following leaving the hospital.  He agrees to proceed with the operation as scheduled for Monday, November 09, 1999.  ADDENDUM:  I will discuss the significance of an elevated thyroid-stimulating hormone with the medical service prior to surgery. DD:  11/06/99 TD:  11/07/99 Job: [id number]

## 2011-02-04 ENCOUNTER — Other Ambulatory Visit: Payer: Self-pay | Admitting: *Deleted

## 2011-02-04 MED ORDER — LISINOPRIL 20 MG PO TABS: 40.0000 mg | ORAL_TABLET | Freq: Every day | ORAL | Status: DC

## 2011-02-04 MED ORDER — ISOSORBIDE MONONITRATE ER 30 MG PO TB24: 30.0000 mg | ORAL_TABLET | Freq: Every day | ORAL | Status: DC

## 2011-02-04 MED ORDER — SIMVASTATIN 40 MG PO TABS: 40.0000 mg | ORAL_TABLET | Freq: Every evening | ORAL | Status: DC

## 2011-04-05 ENCOUNTER — Encounter: Payer: Self-pay | Admitting: Cardiology

## 2011-04-06 ENCOUNTER — Ambulatory Visit (INDEPENDENT_AMBULATORY_CARE_PROVIDER_SITE_OTHER): Payer: Medicare Other | Admitting: Cardiology

## 2011-04-06 ENCOUNTER — Encounter: Payer: Self-pay | Admitting: Cardiology

## 2011-04-06 VITALS — BP 160/80 | HR 62 | Ht 68.0 in | Wt 143.0 lb

## 2011-04-06 DIAGNOSIS — I2510 Atherosclerotic heart disease of native coronary artery without angina pectoris: Secondary | ICD-10-CM

## 2011-04-06 DIAGNOSIS — I739 Peripheral vascular disease, unspecified: Secondary | ICD-10-CM

## 2011-04-06 DIAGNOSIS — R0989 Other specified symptoms and signs involving the circulatory and respiratory systems: Secondary | ICD-10-CM

## 2011-04-06 DIAGNOSIS — E785 Hyperlipidemia, unspecified: Secondary | ICD-10-CM

## 2011-04-06 DIAGNOSIS — R42 Dizziness and giddiness: Secondary | ICD-10-CM

## 2011-04-06 NOTE — Patient Instructions (Signed)
Your physician wants you to follow-up in: 6 months. You will receive a reminder letter in the mail one-two months in advance. If you don't receive a letter, please call our office to schedule the follow-up appointment. Your physician recommends that you continue on your current medications as directed. Please refer to the Current Medication list given to you today. Your physician has requested that you have a carotid duplex. This test is an ultrasound of the carotid arteries in your neck. It looks at blood flow through these arteries that supply the brain with blood. Allow one hour for this exam. There are no restrictions or special instructions.

## 2011-04-07 ENCOUNTER — Other Ambulatory Visit: Payer: Self-pay | Admitting: *Deleted

## 2011-04-07 DIAGNOSIS — I779 Disorder of arteries and arterioles, unspecified: Secondary | ICD-10-CM | POA: Insufficient documentation

## 2011-04-07 DIAGNOSIS — Z79899 Other long term (current) drug therapy: Secondary | ICD-10-CM

## 2011-04-07 DIAGNOSIS — E785 Hyperlipidemia, unspecified: Secondary | ICD-10-CM

## 2011-04-07 NOTE — Assessment & Plan Note (Signed)
Lipid panel liver function tests were also ordered today.

## 2011-04-07 NOTE — Assessment & Plan Note (Signed)
Patient reports no chest pain. Continue risk factor modification and medical therapy

## 2011-04-07 NOTE — Progress Notes (Signed)
HPI: The patient is a 76 year old male with a history of coronary artery disease, status post coronary bypass grafting in 2001. Stress test was done 2 years ago demonstrating a fixed defect but no ischemia. Ejection fraction is 59%. He also has a history of orthostatic/postural dizziness. The patient does get up slowly. However this doesn't seem to be causing any significant problems. He denies any chest pain or shortness of breath. He has no orthopnea PND palpitations or syncope. He is doing well from a cardiovascular perspective  PMH: reviewed and listed in Problem List in Electronic Records (and see below)  Allergies/SH/FHX : available in Electronic Records for review  Medications: Current Outpatient Prescriptions on File Prior to Visit  Medication Sig Dispense Refill  . aspirin EC 81 MG tablet Take 81 mg by mouth daily.        . isosorbide mononitrate (IMDUR) 30 MG 24 hr tablet Take 1 tablet (30 mg total) by mouth daily.  90 tablet  0  . lisinopril (PRINIVIL,ZESTRIL) 20 MG tablet Take 2 tablets (40 mg total) by mouth daily.  180 tablet  0  . nitroGLYCERIN (NITROSTAT) 0.4 MG SL tablet Place 0.4 mg under the tongue every 5 (five) minutes as needed.        . simvastatin (ZOCOR) 40 MG tablet Take 1 tablet (40 mg total) by mouth every evening.  90 tablet  0    ROS: No nausea or vomiting. No fever or chills.No melena or hematochezia.No bleeding.No claudication  Physical Exam: BP 160/80  Pulse 62  Ht 5\' 8"  (1.727 m)  Wt 143 lb (64.864 kg)  BMI 21.74 kg/m2 General: Well-nourished white male Neck: Normal carotid upstroke with right carotid bruit, no thyromegaly nonnodular thyroid, JVP is 6 cm Lungs: Clear breath sounds bilaterally with no wheezing Cardiac: Regular rate and rhythm with normal S1-S2 no pathological murmurs Vascular: Normal dorsalis pedis and posterior tibial pulses bilaterally Skin: Warm and dry  12lead ECG: Normal sinus rhythm Q waves in V1 to V2 but no acute ischemic  changes Limited bedside ECHO:N/A

## 2011-04-07 NOTE — Assessment & Plan Note (Signed)
No evidence of significant peripheral vascular disease the patient has normal pulses on exam.

## 2011-04-07 NOTE — Assessment & Plan Note (Signed)
Patient was instructed to keep well-hydrated. Overall this doesn't appear to cause significant problems.

## 2011-04-07 NOTE — Assessment & Plan Note (Signed)
Carotid Dopplers were ordered.

## 2011-04-21 ENCOUNTER — Encounter: Payer: Medicare Other | Admitting: *Deleted

## 2011-04-22 ENCOUNTER — Encounter: Payer: Medicare Other | Admitting: *Deleted

## 2011-05-05 ENCOUNTER — Encounter: Payer: Medicare Other | Admitting: *Deleted

## 2011-05-31 ENCOUNTER — Other Ambulatory Visit: Payer: Self-pay | Admitting: *Deleted

## 2011-05-31 MED ORDER — LISINOPRIL 20 MG PO TABS: 40.0000 mg | ORAL_TABLET | Freq: Every day | ORAL | Status: DC

## 2011-05-31 MED ORDER — ISOSORBIDE MONONITRATE ER 30 MG PO TB24: 30.0000 mg | ORAL_TABLET | Freq: Every day | ORAL | Status: DC

## 2011-05-31 MED ORDER — SIMVASTATIN 40 MG PO TABS: 40.0000 mg | ORAL_TABLET | Freq: Every evening | ORAL | Status: DC

## 2012-04-03 DIAGNOSIS — R079 Chest pain, unspecified: Secondary | ICD-10-CM

## 2012-04-03 DIAGNOSIS — I498 Other specified cardiac arrhythmias: Secondary | ICD-10-CM

## 2012-04-04 ENCOUNTER — Other Ambulatory Visit: Payer: Self-pay | Admitting: Physician Assistant

## 2012-04-04 DIAGNOSIS — I2510 Atherosclerotic heart disease of native coronary artery without angina pectoris: Secondary | ICD-10-CM

## 2012-04-04 DIAGNOSIS — R079 Chest pain, unspecified: Secondary | ICD-10-CM

## 2012-04-05 DIAGNOSIS — R079 Chest pain, unspecified: Secondary | ICD-10-CM

## 2012-04-19 ENCOUNTER — Ambulatory Visit (INDEPENDENT_AMBULATORY_CARE_PROVIDER_SITE_OTHER): Payer: Medicare Other | Admitting: Physician Assistant

## 2012-04-19 ENCOUNTER — Encounter: Payer: Self-pay | Admitting: Physician Assistant

## 2012-04-19 VITALS — BP 153/80 | HR 64 | Ht 68.0 in | Wt 144.0 lb

## 2012-04-19 DIAGNOSIS — R001 Bradycardia, unspecified: Secondary | ICD-10-CM

## 2012-04-19 DIAGNOSIS — I498 Other specified cardiac arrhythmias: Secondary | ICD-10-CM

## 2012-04-19 DIAGNOSIS — I2510 Atherosclerotic heart disease of native coronary artery without angina pectoris: Secondary | ICD-10-CM

## 2012-04-19 DIAGNOSIS — R0989 Other specified symptoms and signs involving the circulatory and respiratory systems: Secondary | ICD-10-CM

## 2012-04-19 DIAGNOSIS — E785 Hyperlipidemia, unspecified: Secondary | ICD-10-CM

## 2012-04-19 DIAGNOSIS — E782 Mixed hyperlipidemia: Secondary | ICD-10-CM

## 2012-04-19 NOTE — Progress Notes (Signed)
Primary Cardiologist: Johnny Bridge, MD (new)   HPI: Post hospital followup from [facility], seen in consultation by Dr. Domenic Polite for atypical CP. Cardiac markers normal. Patient referred for outpatient evaluation with a Lexiscan Cardiolite, which was normal. He also presented in SB, with rates in the 40 bpm range. Atenolol dose was decreased to half dose, at discharge.  Clinically, he denies any exertional CP. He continues to have occasional, intermittent sharp chest pains in the right upper quadrant, of very brief duration, and unpredictable in onset. Also, he has felt considerably better since his atenolol dose was cut in half. Specifically, he reports increased exercise tolerance, and no further dizziness.  Not on File  Current Outpatient Prescriptions  Medication Sig Dispense Refill  . aspirin EC 81 MG tablet Take 81 mg by mouth daily.        Marland Kitchen COLCRYS 0.6 MG tablet Take 1 tablet by mouth Once daily as needed.      . isosorbide mononitrate (IMDUR) 30 MG 24 hr tablet Take 1 tablet (30 mg total) by mouth daily.  90 tablet  3  . lisinopril (PRINIVIL,ZESTRIL) 20 MG tablet Take 20 mg by mouth 2 (two) times daily.      . nitroGLYCERIN (NITROSTAT) 0.4 MG SL tablet Place 0.4 mg under the tongue every 5 (five) minutes as needed.        . simvastatin (ZOCOR) 40 MG tablet Take 1 tablet (40 mg total) by mouth every evening.  90 tablet  3  . Specialty Vitamins Products (PROSTATE) TABS Take 1 tablet by mouth daily.      . [DISCONTINUED] lisinopril (PRINIVIL,ZESTRIL) 20 MG tablet Take 2 tablets (40 mg total) by mouth daily.  180 tablet  3    Past Medical History  Diagnosis Date  . Other and unspecified hyperlipidemia   . Unspecified essential hypertension   . Peripheral vascular disease, unspecified   . Coronary atherosclerosis of native coronary artery   . Dizziness and giddiness   . History of tobacco abuse   . Sinus bradycardia     Asymptomatic  . Alcohol use     mild    Past Surgical History    Procedure Date  . Coronary artery bypass graft 11/09/1999    History   Social History  . Marital Status: Married    Spouse Name: N/A    Number of Children: N/A  . Years of Education: N/A   Occupational History  . DISABLED     [employer]   Social History Main Topics  . Smoking status: Former Smoker -- 0.5 packs/day for 50 years    Types: Cigarettes    Quit date: 06/08/1999  . Smokeless tobacco: Never Used  . Alcohol Use: No  . Drug Use: No  . Sexually Active: Not on file   Other Topics Concern  . Not on file   Social History Narrative  . No narrative on file    No family history on file.  ROS: no nausea, vomiting; no fever, chills; no melena, hematochezia; no claudication  PHYSICAL EXAM: BP 153/80  Pulse 64  Ht 5\' 8"  (1.727 m)  Wt 144 lb (65.318 kg)  BMI 21.90 kg/m2 GENERAL: 77 year old male; NAD HEENT: NCAT, PERRLA, EOMI; sclera clear; no xanthelasma NECK: palpable bilateral carotid pulses, soft, bilateral bruits; no JVD; no TM LUNGS: CTA bilaterally CARDIAC: RRR (S1, S2); no significant murmurs; no rubs or gallops ABDOMEN: soft, non-tender; intact BS EXTREMETIES: intact distal pulses; no significant peripheral edema SKIN: warm/dry; no obvious rash/lesions  MUSCULOSKELETAL: no joint deformity NEURO: no focal deficit; NL affect   EKG:    ASSESSMENT & PLAN:  CAD, NATIVE VESSEL No further workup indicated. Recent hospitalization with atypical CP, normal cardiac markers, and normal outpatient Lexiscan Cardiolite.  Bradycardia Atenolol to be discontinued. Patient has no current clinical indication for treatment with beta blocker. He recently presented with significant bradycardia, and has felt much better since atenolol dose was cut in half. Recommend increasing his ACE inhibitor dose, if needed, for uncontrolled HTN.  Carotid bruit Will order carotid Dopplers to rule out significant disease progression. Last study in May 2010 indicated less than 50%  bilateral ICA stenosis.  HYPERLIPIDEMIA-MIXED LDL 90 back in February of this year. Current simvastatin dose has remained unchanged. Patient cites prior intolerance to Lipitor. I recommended that he work more diligently in reducing his saturated fat intake, and we'll reassess lipid status in 12 weeks. If LDL still not at goal, then I will either increase simvastatin dose or switch to low-dose Crestor.    Gene Gillermo Poch, PAC

## 2012-04-19 NOTE — Assessment & Plan Note (Signed)
Atenolol to be discontinued. Patient has no current clinical indication for treatment with beta blocker. He recently presented with significant bradycardia, and has felt much better since atenolol dose was cut in half. Recommend increasing his ACE inhibitor dose, if needed, for uncontrolled HTN.

## 2012-04-19 NOTE — Assessment & Plan Note (Signed)
LDL 90 back in February of this year. Current simvastatin dose has remained unchanged. Patient cites prior intolerance to Lipitor. I recommended that he work more diligently in reducing his saturated fat intake, and we'll reassess lipid status in 12 weeks. If LDL still not at goal, then I will either increase simvastatin dose or switch to low-dose Crestor.

## 2012-04-19 NOTE — Assessment & Plan Note (Signed)
No further workup indicated. Recent hospitalization with atypical CP, normal cardiac markers, and normal outpatient Lexiscan Cardiolite.

## 2012-04-19 NOTE — Patient Instructions (Addendum)
   Stop Atenolol  Carotid Doppler  Lab due in 3 months for fasting lipid and liver panel - will mail reminder  Office will contact with results Continue all other current medications. Your physician wants you to follow up in:  1 year.  You will receive a reminder letter in the mail one-two months in advance.  If you don't receive a letter, please call our office to schedule the follow up appointment

## 2012-04-19 NOTE — Assessment & Plan Note (Signed)
Will order carotid Dopplers to rule out significant disease progression. Last study in May 2010 indicated less than 50% bilateral ICA stenosis.

## 2012-05-17 ENCOUNTER — Encounter (INDEPENDENT_AMBULATORY_CARE_PROVIDER_SITE_OTHER): Payer: Medicare Other

## 2012-05-17 DIAGNOSIS — R0989 Other specified symptoms and signs involving the circulatory and respiratory systems: Secondary | ICD-10-CM

## 2012-05-17 DIAGNOSIS — I2510 Atherosclerotic heart disease of native coronary artery without angina pectoris: Secondary | ICD-10-CM

## 2012-05-17 DIAGNOSIS — I6529 Occlusion and stenosis of unspecified carotid artery: Secondary | ICD-10-CM

## 2012-05-22 ENCOUNTER — Telehealth: Payer: Self-pay | Admitting: *Deleted

## 2012-05-22 NOTE — Telephone Encounter (Signed)
Message copied by Merlene Laughter on Mon May 22, 2012 10:51 AM ------      Message from: Donney Dice      Created: Fri May 19, 2012 12:01 PM       Mild carotid dz. F/u 1 year

## 2012-05-22 NOTE — Telephone Encounter (Signed)
Wife informed

## 2012-06-12 ENCOUNTER — Other Ambulatory Visit: Payer: Self-pay | Admitting: *Deleted

## 2012-06-13 ENCOUNTER — Other Ambulatory Visit: Payer: Self-pay | Admitting: *Deleted

## 2012-06-13 MED ORDER — ISOSORBIDE MONONITRATE ER 30 MG PO TB24: 30.0000 mg | ORAL_TABLET | Freq: Every day | ORAL | Status: DC

## 2012-06-13 MED ORDER — LISINOPRIL 20 MG PO TABS: 20.0000 mg | ORAL_TABLET | Freq: Two times a day (BID) | ORAL | Status: DC

## 2012-06-13 MED ORDER — NITROGLYCERIN 0.4 MG SL SUBL: 0.4000 mg | SUBLINGUAL_TABLET | SUBLINGUAL | Status: DC | PRN

## 2012-06-13 MED ORDER — SIMVASTATIN 40 MG PO TABS: 40.0000 mg | ORAL_TABLET | Freq: Every evening | ORAL | Status: DC

## 2012-11-01 ENCOUNTER — Encounter (HOSPITAL_COMMUNITY): Payer: Self-pay | Admitting: Pharmacy Technician

## 2012-11-06 ENCOUNTER — Other Ambulatory Visit: Payer: Self-pay

## 2012-11-06 ENCOUNTER — Encounter (HOSPITAL_COMMUNITY)
Admission: RE | Admit: 2012-11-06 | Discharge: 2012-11-06 | Disposition: A | Discharge status: Home / Self Care | Payer: Medicare Other | Source: Ambulatory Visit | Attending: Ophthalmology | Admitting: Ophthalmology

## 2012-11-06 ENCOUNTER — Encounter (HOSPITAL_COMMUNITY): Payer: Self-pay

## 2012-11-06 HISTORY — DX: Unspecified hearing loss, unspecified ear: H91.90

## 2012-11-06 HISTORY — DX: Gout, unspecified: M10.9

## 2012-11-06 LAB — BASIC METABOLIC PANEL
Chloride: 104 mEq/L (ref 96–112)
GFR calc Af Amer: 65 mL/min — ABNORMAL LOW (ref >90)
Potassium: 4.8 mEq/L (ref 3.5–5.1)

## 2012-11-06 LAB — HEMOGLOBIN AND HEMATOCRIT, BLOOD
HCT: 38.4 % — ABNORMAL LOW (ref 39.0–52.0)
Hemoglobin: 13 g/dL (ref 13.0–17.0)

## 2012-11-06 NOTE — Patient Instructions (Signed)
VEDH BRITO  11/06/2012   Your procedure is scheduled on:  11/13/12  Report to [facility] at 0730 AM.  Call this number if you have problems the morning of surgery: [phone]   Remember:   Do not eat food or drink liquids after midnight.   Take these medicines the morning of surgery with A SIP OF WATER: flexaril, imdur, lisinopril   Do not wear jewelry, make-up or nail polish.  Do not wear lotions, powders, or perfumes. You may wear deodorant.  Do not shave 48 hours prior to surgery. Men may shave face and neck.  Do not bring valuables to the hospital.  [facility] is not responsible                   for any belongings or valuables.  Contacts, dentures or bridgework may not be worn into surgery.  Leave suitcase in the car. After surgery it may be brought to your room.  For patients admitted to the hospital, checkout time is 11:00 AM the day of  discharge.   Patients discharged the day of surgery will not be allowed to drive  home.  Name and phone number of your driver: family  Special Instructions: N/A   Please read over the following fact sheets that you were given: Anesthesia Post-op Instructions and Care and Recovery After Surgery   PATIENT INSTRUCTIONS POST-ANESTHESIA  IMMEDIATELY FOLLOWING SURGERY:  Do not drive or operate machinery for the first twenty four hours after surgery.  Do not make any important decisions for twenty four hours after surgery or while taking narcotic pain medications or sedatives.  If you develop intractable nausea and vomiting or a severe headache please notify your doctor immediately.  FOLLOW-UP:  Please make an appointment with your surgeon as instructed. You do not need to follow up with anesthesia unless specifically instructed to do so.  WOUND CARE INSTRUCTIONS (if applicable):  Keep a dry clean dressing on the anesthesia/puncture wound site if there is drainage.  Once the wound has quit draining you may leave it open to air.  Generally you should  leave the bandage intact for twenty four hours unless there is drainage.  If the epidural site drains for more than 36-48 hours please call the anesthesia department.  QUESTIONS?:  Please feel free to call your physician or the hospital operator if you have any questions, and they will be happy to assist you.      Cataract Surgery  A cataract is a clouding of the lens of the eye. When a lens becomes cloudy, vision is reduced based on the degree and nature of the clouding. Surgery may be needed to improve vision. Surgery removes the cloudy lens and usually replaces it with a substitute lens (intraocular lens, IOL). LET YOUR EYE DOCTOR KNOW ABOUT:  Allergies to food or medicine.  Medicines taken including herbs, eyedrops, over-the-counter medicines, and creams.  Use of steroids (by mouth or creams).  Previous problems with anesthetics or numbing medicine.  History of bleeding problems or blood clots.  Previous surgery.  Other health problems, including diabetes and kidney problems.  Possibility of pregnancy, if this applies. RISKS AND COMPLICATIONS  Infection.  Inflammation of the eyeball (endophthalmitis) that can spread to both eyes (sympathetic ophthalmia).  Poor wound healing.  If an IOL is inserted, it can later fall out of proper position. This is very uncommon.  Clouding of the part of your eye that holds an IOL in place. This is called  an "after-cataract." These are uncommon, but easily treated. BEFORE THE PROCEDURE  Do not eat or drink anything except small amounts of water for 8 to 12 before your surgery, or as directed by your caregiver.  Unless you are told otherwise, continue any eyedrops you have been prescribed.  Talk to your primary caregiver about all other medicines that you take (both prescription and non-prescription). In some cases, you may need to stop or change medicines near the time of your surgery. This is most important if you are taking  blood-thinning medicine.Do not stop medicines unless you are told to do so.  Arrange for someone to drive you to and from the procedure.  Do not put contact lenses in either eye on the day of your surgery. PROCEDURE There is more than one method for safely removing a cataract. Your doctor can explain the differences and help determine which is best for you. Phacoemulsification surgery is the most common form of cataract surgery.  An injection is given behind the eye or eyedrops are given to make this a painless procedure.  A small cut (incision) is made on the edge of the clear, dome-shaped surface that covers the front of the eye (cornea).  A tiny probe is painlessly inserted into the eye. This device gives off ultrasound waves that soften and break up the cloudy center of the lens. This makes it easier for the cloudy lens to be removed by suction.  An IOL may be implanted.  The normal lens of the eye is covered by a clear capsule. Part of that capsule is intentionally left in the eye to support the IOL.  Your surgeon may or may not use stitches to close the incision. There are other forms of cataract surgery that require a larger incision and stiches to close the eye. This approach is taken in cases where the doctor feels that the cataract cannot be easily removed using phacoemulsification. AFTER THE PROCEDURE  When an IOL is implanted, it does not need care. It becomes a permanent part of your eye and cannot be seen or felt.  Your doctor will schedule follow-up exams to check on your progress.  Review your other medicines with your doctor to see which can be resumed after surgery.  Use eyedrops or take medicine as prescribed by your doctor. Document Released: 05/13/2011 Document Revised: 08/16/2011 Document Reviewed: 05/13/2011 [facility] Patient Information 2014 [place], [place].

## 2012-11-10 MED ORDER — LIDOCAINE HCL 3.5 % OP GEL
OPHTHALMIC | Status: AC
  Filled 2012-11-10: qty 5

## 2012-11-10 MED ORDER — CYCLOPENTOLATE-PHENYLEPHRINE 0.2-1 % OP SOLN
OPHTHALMIC | Status: AC
  Filled 2012-11-10: qty 2

## 2012-11-10 MED ORDER — NEOMYCIN-POLYMYXIN-DEXAMETH 3.5-10000-0.1 OP OINT
TOPICAL_OINTMENT | OPHTHALMIC | Status: AC
  Filled 2012-11-10: qty 3.5

## 2012-11-10 MED ORDER — LIDOCAINE HCL (PF) 1 % IJ SOLN
INTRAMUSCULAR | Status: AC
  Filled 2012-11-10: qty 2

## 2012-11-10 MED ORDER — TETRACAINE HCL 0.5 % OP SOLN
OPHTHALMIC | Status: AC
  Filled 2012-11-10: qty 2

## 2012-11-10 MED ORDER — PHENYLEPHRINE HCL 2.5 % OP SOLN
OPHTHALMIC | Status: AC
  Filled 2012-11-10: qty 2

## 2012-11-13 ENCOUNTER — Ambulatory Visit (HOSPITAL_COMMUNITY)
Admission: RE | Admit: 2012-11-13 | Discharge: 2012-11-13 | Disposition: A | Discharge status: Home / Self Care | Payer: Medicare Other | Source: Ambulatory Visit | Attending: Ophthalmology | Admitting: Ophthalmology

## 2012-11-13 ENCOUNTER — Encounter (HOSPITAL_COMMUNITY): Payer: Self-pay | Admitting: *Deleted

## 2012-11-13 ENCOUNTER — Encounter (HOSPITAL_COMMUNITY): Payer: Self-pay | Admitting: Anesthesiology

## 2012-11-13 ENCOUNTER — Ambulatory Visit (HOSPITAL_COMMUNITY): Payer: Medicare Other | Admitting: Anesthesiology

## 2012-11-13 ENCOUNTER — Encounter (HOSPITAL_COMMUNITY)
Admission: RE | Disposition: A | Discharge status: Home / Self Care | Payer: Self-pay | Source: Ambulatory Visit | Attending: Ophthalmology

## 2012-11-13 DIAGNOSIS — Z01812 Encounter for preprocedural laboratory examination: Secondary | ICD-10-CM | POA: Insufficient documentation

## 2012-11-13 DIAGNOSIS — H2510 Age-related nuclear cataract, unspecified eye: Secondary | ICD-10-CM | POA: Insufficient documentation

## 2012-11-13 DIAGNOSIS — I10 Essential (primary) hypertension: Secondary | ICD-10-CM | POA: Insufficient documentation

## 2012-11-13 DIAGNOSIS — Z951 Presence of aortocoronary bypass graft: Secondary | ICD-10-CM | POA: Insufficient documentation

## 2012-11-13 DIAGNOSIS — Z01810 Encounter for preprocedural cardiovascular examination: Secondary | ICD-10-CM | POA: Insufficient documentation

## 2012-11-13 DIAGNOSIS — Z79899 Other long term (current) drug therapy: Secondary | ICD-10-CM | POA: Insufficient documentation

## 2012-11-13 HISTORY — PX: CATARACT EXTRACTION W/PHACO: SHX586

## 2012-11-13 SURGERY — PHACOEMULSIFICATION, CATARACT, WITH IOL INSERTION
Anesthesia: Monitor Anesthesia Care | Site: Eye | Laterality: Right | Wound class: Clean

## 2012-11-13 MED ORDER — PHENYLEPHRINE HCL 2.5 % OP SOLN
1.0000 [drp] | OPHTHALMIC | Status: AC
  Administered 2012-11-13 (×3): 1 [drp] via OPHTHALMIC

## 2012-11-13 MED ORDER — LIDOCAINE HCL 3.5 % OP GEL: 1.0000 "application " | Freq: Once | OPHTHALMIC | Status: DC

## 2012-11-13 MED ORDER — MIDAZOLAM HCL 2 MG/2ML IJ SOLN
1.0000 mg | INTRAMUSCULAR | Status: DC | PRN
  Administered 2012-11-13: 2 mg via INTRAVENOUS

## 2012-11-13 MED ORDER — LACTATED RINGERS IV SOLN
INTRAVENOUS | Status: DC
  Administered 2012-11-13: 09:00:00 via INTRAVENOUS

## 2012-11-13 MED ORDER — CYCLOPENTOLATE-PHENYLEPHRINE 0.2-1 % OP SOLN
1.0000 [drp] | OPHTHALMIC | Status: AC
  Administered 2012-11-13 (×3): 1 [drp] via OPHTHALMIC

## 2012-11-13 MED ORDER — POVIDONE-IODINE 5 % OP SOLN
OPHTHALMIC | Status: DC | PRN
  Administered 2012-11-13: 1 via OPHTHALMIC

## 2012-11-13 MED ORDER — LIDOCAINE HCL (PF) 1 % IJ SOLN
INTRAMUSCULAR | Status: DC | PRN
  Administered 2012-11-13: .6 mL

## 2012-11-13 MED ORDER — MIDAZOLAM HCL 2 MG/2ML IJ SOLN
INTRAMUSCULAR | Status: AC
  Filled 2012-11-13: qty 2

## 2012-11-13 MED ORDER — LIDOCAINE 3.5 % OP GEL OPTIME - NO CHARGE
OPHTHALMIC | Status: DC | PRN
  Administered 2012-11-13: 1 [drp] via OPHTHALMIC

## 2012-11-13 MED ORDER — EPINEPHRINE HCL 1 MG/ML IJ SOLN
INTRAMUSCULAR | Status: AC
  Filled 2012-11-13: qty 1

## 2012-11-13 MED ORDER — BSS IO SOLN
INTRAOCULAR | Status: DC | PRN
  Administered 2012-11-13: 15 mL via INTRAOCULAR

## 2012-11-13 MED ORDER — TETRACAINE HCL 0.5 % OP SOLN
1.0000 [drp] | OPHTHALMIC | Status: AC
  Administered 2012-11-13 (×3): 1 [drp] via OPHTHALMIC

## 2012-11-13 MED ORDER — NEOMYCIN-POLYMYXIN-DEXAMETH 0.1 % OP OINT
TOPICAL_OINTMENT | OPHTHALMIC | Status: DC | PRN
  Administered 2012-11-13: 1 via OPHTHALMIC

## 2012-11-13 MED ORDER — PROVISC 10 MG/ML IO SOLN
INTRAOCULAR | Status: DC | PRN
  Administered 2012-11-13: 8.5 mg via INTRAOCULAR

## 2012-11-13 MED ORDER — BSS IO SOLN
INTRAOCULAR | Status: DC | PRN
  Administered 2012-11-13: 09:00:00

## 2012-11-13 SURGICAL SUPPLY — 32 items
CAPSULAR TENSION RING-AMO (OPHTHALMIC RELATED) IMPLANT
CLOTH BEACON ORANGE TIMEOUT ST (SAFETY) ×2 IMPLANT
EYE SHIELD UNIVERSAL CLEAR (GAUZE/BANDAGES/DRESSINGS) ×2 IMPLANT
GLOVE BIO SURGEON STRL SZ 6.5 (GLOVE) IMPLANT
GLOVE BIOGEL PI IND STRL 6.5 (GLOVE) IMPLANT
GLOVE BIOGEL PI IND STRL 7.0 (GLOVE) ×2 IMPLANT
GLOVE BIOGEL PI IND STRL 7.5 (GLOVE) IMPLANT
GLOVE BIOGEL PI INDICATOR 6.5 (GLOVE)
GLOVE BIOGEL PI INDICATOR 7.0 (GLOVE) ×2
GLOVE BIOGEL PI INDICATOR 7.5 (GLOVE)
GLOVE ECLIPSE 6.5 STRL STRAW (GLOVE) IMPLANT
GLOVE ECLIPSE 7.0 STRL STRAW (GLOVE) IMPLANT
GLOVE ECLIPSE 7.5 STRL STRAW (GLOVE) IMPLANT
GLOVE EXAM NITRILE LRG STRL (GLOVE) IMPLANT
GLOVE EXAM NITRILE MD LF STRL (GLOVE) IMPLANT
GLOVE SKINSENSE NS SZ6.5 (GLOVE)
GLOVE SKINSENSE NS SZ7.0 (GLOVE)
GLOVE SKINSENSE STRL SZ6.5 (GLOVE) IMPLANT
GLOVE SKINSENSE STRL SZ7.0 (GLOVE) IMPLANT
KIT VITRECTOMY (OPHTHALMIC RELATED) IMPLANT
PAD ARMBOARD 7.5X6 YLW CONV (MISCELLANEOUS) ×2 IMPLANT
PROC W NO LENS (INTRAOCULAR LENS)
PROC W SPEC LENS (INTRAOCULAR LENS)
PROCESS W NO LENS (INTRAOCULAR LENS) IMPLANT
PROCESS W SPEC LENS (INTRAOCULAR LENS) IMPLANT
RING MALYGIN (MISCELLANEOUS) IMPLANT
SIGHTPATH CAT PROC W REG LENS (Ophthalmic Related) ×2 IMPLANT
SYR TB 1ML LL NO SAFETY (SYRINGE) ×2 IMPLANT
TAPE SURG TRANSPORE 1 IN (GAUZE/BANDAGES/DRESSINGS) ×1 IMPLANT
TAPE SURGICAL TRANSPORE 1 IN (GAUZE/BANDAGES/DRESSINGS) ×1
VISCOELASTIC ADDITIONAL (OPHTHALMIC RELATED) IMPLANT
WATER STERILE IRR 250ML POUR (IV SOLUTION) ×2 IMPLANT

## 2012-11-13 NOTE — Anesthesia Postprocedure Evaluation (Signed)
  Anesthesia Post-op Note  Patient: Billy [name]  Procedure(s) Performed: Procedure(s) with comments: CATARACT EXTRACTION PHACO AND INTRAOCULAR LENS PLACEMENT (IOC) (Right) - CDE:12.75  Patient Location: Short Stay  Anesthesia Type:MAC  Level of Consciousness: awake, alert , oriented and patient cooperative  Airway and Oxygen Therapy: Patient Spontanous Breathing  Post-op Pain: none  Post-op Assessment: Post-op Vital signs reviewed, Patient's Cardiovascular Status Stable, Respiratory Function Stable, Patent Airway and Pain level controlled  Post-op Vital Signs: Reviewed and stable  Complications: No apparent anesthesia complications

## 2012-11-13 NOTE — Anesthesia Preprocedure Evaluation (Signed)
Anesthesia Evaluation  Patient identified by MRN, date of birth, ID band Patient awake    Reviewed: Allergy & Precautions, H&P , NPO status , Patient's Chart, lab work & pertinent test results  Airway Mallampati: II TM Distance: >3 FB     Dental  (+) Edentulous Upper and Edentulous Lower   Pulmonary former smoker,  breath sounds clear to auscultation        Cardiovascular hypertension, Pt. on medications + CAD, + CABG and + Peripheral Vascular Disease + dysrhythmias Rhythm:Regular Rate:Bradycardia     Neuro/Psych    GI/Hepatic negative GI ROS,   Endo/Other    Renal/GU      Musculoskeletal   Abdominal   Peds  Hematology   Anesthesia Other Findings   Reproductive/Obstetrics                           Anesthesia Physical Anesthesia Plan  ASA: III  Anesthesia Plan: MAC   Post-op Pain Management:    Induction: Intravenous  Airway Management Planned: Nasal Cannula  Additional Equipment:   Intra-op Plan:   Post-operative Plan:   Informed Consent: I have reviewed the patients History and Physical, chart, labs and discussed the procedure including the risks, benefits and alternatives for the proposed anesthesia with the patient or authorized representative who has indicated his/her understanding and acceptance.     Plan Discussed with:   Anesthesia Plan Comments:         Anesthesia Quick Evaluation

## 2012-11-13 NOTE — Transfer of Care (Signed)
Immediate Anesthesia Transfer of Care Note  Patient: Billy [name]  Procedure(s) Performed: Procedure(s) with comments: CATARACT EXTRACTION PHACO AND INTRAOCULAR LENS PLACEMENT (IOC) (Right) - CDE:12.75  Patient Location: Short Stay  Anesthesia Type:MAC  Level of Consciousness: awake, alert , oriented and patient cooperative  Airway & Oxygen Therapy: Patient Spontanous Breathing  Post-op Assessment: Report given to PACU RN and Post -op Vital signs reviewed and stable  Post vital signs: Reviewed and stable  Complications: No apparent anesthesia complications

## 2012-11-13 NOTE — H&P (Signed)
I have reviewed the H&P, the patient was re-examined, and I have identified no interval changes in medical condition and plan of care since the history and physical of record  

## 2012-11-13 NOTE — Op Note (Signed)
Date of Admission: 11/13/2012  Date of Surgery: 11/13/2012  Pre-Op Dx: Cataract  Right  Eye  Post-Op Dx: Cataract  Right  Eye,  Dx Code 366.16  Surgeon: Tonny Branch, M.D.  Assistants: None  Anesthesia: Topical with MAC  Indications: Painless, progressive loss of vision with compromise of daily activities.  Surgery: Cataract Extraction with Intraocular lens Implant Right Eye  Discription: The patient had dilating drops and viscous lidocaine placed into the left eye in the pre-op holding area. After transfer to the operating room, a time out was performed. The patient was then prepped and draped. Beginning with a 17 degree blade a paracentesis port was made at the surgeon's 2 o'clock position. The anterior chamber was then filled with 1% non-preserved lidocaine. This was followed by filling the anterior chamber with Provisc. A bent cystatome needle was used to create a continuous tear capsulotomy. Hydrodissection was performed with balanced salt solution on a Fine canula. The lens nucleus was then removed using the phacoemulsification handpiece. Residual cortex was removed with the I&A handpiece. The anterior chamber and capsular bag were refilled with Provisc. A posterior chamber intraocular lens was placed into the capsular bag with it's injector. The implant was positioned with the Kuglan hook. The Provisc was then removed from the anterior chamber and capsular bag with the I&A handpiece. Stromal hydration of the main incision and paracentesis port was performed with BSS on a Fine canula. The wounds were tested for leak which was negative. The patient tolerated the procedure well. There were no operative complications. The patient was then transferred to the recovery room in stable condition.  Complications: None  Specimen: None  EBL: None  Prosthetic device: B&L enVista, MX60, power 21.5D, SN [id number].

## 2012-11-14 ENCOUNTER — Encounter (HOSPITAL_COMMUNITY): Payer: Self-pay | Admitting: Ophthalmology

## 2013-01-02 ENCOUNTER — Encounter (HOSPITAL_COMMUNITY): Payer: Self-pay | Admitting: Pharmacy Technician

## 2013-01-03 MED ORDER — FENTANYL CITRATE 0.05 MG/ML IJ SOLN: 25.0000 ug | INTRAMUSCULAR | Status: DC | PRN

## 2013-01-03 MED ORDER — ONDANSETRON HCL 4 MG/2ML IJ SOLN: 4.0000 mg | Freq: Once | INTRAMUSCULAR | Status: AC | PRN

## 2013-01-04 ENCOUNTER — Encounter (HOSPITAL_COMMUNITY)
Admission: RE | Admit: 2013-01-04 | Discharge: 2013-01-04 | Disposition: A | Discharge status: Home / Self Care | Payer: Medicare Other | Source: Ambulatory Visit | Attending: Ophthalmology | Admitting: Ophthalmology

## 2013-01-10 MED ORDER — CYCLOPENTOLATE-PHENYLEPHRINE 0.2-1 % OP SOLN
OPHTHALMIC | Status: AC
  Filled 2013-01-10: qty 2

## 2013-01-10 MED ORDER — NEOMYCIN-POLYMYXIN-DEXAMETH 3.5-10000-0.1 OP OINT
TOPICAL_OINTMENT | OPHTHALMIC | Status: AC
  Filled 2013-01-10: qty 3.5

## 2013-01-10 MED ORDER — TETRACAINE HCL 0.5 % OP SOLN
OPHTHALMIC | Status: AC
  Filled 2013-01-10: qty 2

## 2013-01-10 MED ORDER — PHENYLEPHRINE HCL 2.5 % OP SOLN
OPHTHALMIC | Status: AC
  Filled 2013-01-10: qty 2

## 2013-01-10 MED ORDER — LIDOCAINE HCL (PF) 1 % IJ SOLN
INTRAMUSCULAR | Status: AC
  Filled 2013-01-10: qty 2

## 2013-01-10 MED ORDER — LIDOCAINE HCL 3.5 % OP GEL
OPHTHALMIC | Status: AC
  Filled 2013-01-10: qty 5

## 2013-01-11 ENCOUNTER — Encounter (HOSPITAL_COMMUNITY): Payer: Self-pay | Admitting: *Deleted

## 2013-01-11 ENCOUNTER — Encounter (HOSPITAL_COMMUNITY): Payer: Self-pay | Admitting: Anesthesiology

## 2013-01-11 ENCOUNTER — Ambulatory Visit (HOSPITAL_COMMUNITY): Payer: Medicare Other | Admitting: Anesthesiology

## 2013-01-11 ENCOUNTER — Encounter (HOSPITAL_COMMUNITY)
Admission: RE | Disposition: A | Discharge status: Home / Self Care | Payer: Self-pay | Source: Ambulatory Visit | Attending: Ophthalmology

## 2013-01-11 ENCOUNTER — Ambulatory Visit (HOSPITAL_COMMUNITY)
Admission: RE | Admit: 2013-01-11 | Discharge: 2013-01-11 | Disposition: A | Discharge status: Home / Self Care | Payer: Medicare Other | Source: Ambulatory Visit | Attending: Ophthalmology | Admitting: Ophthalmology

## 2013-01-11 DIAGNOSIS — I2510 Atherosclerotic heart disease of native coronary artery without angina pectoris: Secondary | ICD-10-CM | POA: Insufficient documentation

## 2013-01-11 DIAGNOSIS — I10 Essential (primary) hypertension: Secondary | ICD-10-CM | POA: Insufficient documentation

## 2013-01-11 DIAGNOSIS — H2510 Age-related nuclear cataract, unspecified eye: Secondary | ICD-10-CM | POA: Insufficient documentation

## 2013-01-11 DIAGNOSIS — Z9889 Other specified postprocedural states: Secondary | ICD-10-CM | POA: Insufficient documentation

## 2013-01-11 HISTORY — PX: CATARACT EXTRACTION W/PHACO: SHX586

## 2013-01-11 SURGERY — PHACOEMULSIFICATION, CATARACT, WITH IOL INSERTION
Anesthesia: Monitor Anesthesia Care | Site: Eye | Laterality: Left | Wound class: Clean

## 2013-01-11 MED ORDER — LIDOCAINE 3.5 % OP GEL OPTIME - NO CHARGE
OPHTHALMIC | Status: DC | PRN
  Administered 2013-01-11: 1 [drp] via OPHTHALMIC

## 2013-01-11 MED ORDER — TETRACAINE HCL 0.5 % OP SOLN
1.0000 [drp] | OPHTHALMIC | Status: AC
  Administered 2013-01-11 (×3): 1 [drp] via OPHTHALMIC

## 2013-01-11 MED ORDER — EPINEPHRINE HCL 1 MG/ML IJ SOLN
INTRAMUSCULAR | Status: AC
  Filled 2013-01-11: qty 1

## 2013-01-11 MED ORDER — NEOMYCIN-POLYMYXIN-DEXAMETH 0.1 % OP OINT
TOPICAL_OINTMENT | OPHTHALMIC | Status: DC | PRN
  Administered 2013-01-11: 1 via OPHTHALMIC

## 2013-01-11 MED ORDER — MIDAZOLAM HCL 2 MG/2ML IJ SOLN
INTRAMUSCULAR | Status: AC
  Filled 2013-01-11: qty 2

## 2013-01-11 MED ORDER — PHENYLEPHRINE HCL 2.5 % OP SOLN
1.0000 [drp] | OPHTHALMIC | Status: AC
  Administered 2013-01-11 (×3): 1 [drp] via OPHTHALMIC

## 2013-01-11 MED ORDER — PROVISC 10 MG/ML IO SOLN
INTRAOCULAR | Status: DC | PRN
  Administered 2013-01-11: 8.5 mg via INTRAOCULAR

## 2013-01-11 MED ORDER — LIDOCAINE HCL 3.5 % OP GEL
1.0000 "application " | Freq: Once | OPHTHALMIC | Status: AC
  Administered 2013-01-11: 1 via OPHTHALMIC

## 2013-01-11 MED ORDER — POVIDONE-IODINE 5 % OP SOLN
OPHTHALMIC | Status: DC | PRN
  Administered 2013-01-11: 1 via OPHTHALMIC

## 2013-01-11 MED ORDER — BSS IO SOLN
INTRAOCULAR | Status: DC | PRN
  Administered 2013-01-11: 15 mL via INTRAOCULAR

## 2013-01-11 MED ORDER — EPINEPHRINE HCL 1 MG/ML IJ SOLN
INTRAOCULAR | Status: DC | PRN
  Administered 2013-01-11: 10:00:00

## 2013-01-11 MED ORDER — MIDAZOLAM HCL 2 MG/2ML IJ SOLN
1.0000 mg | INTRAMUSCULAR | Status: DC | PRN
  Administered 2013-01-11: 2 mg via INTRAVENOUS

## 2013-01-11 MED ORDER — CYCLOPENTOLATE-PHENYLEPHRINE 0.2-1 % OP SOLN
1.0000 [drp] | OPHTHALMIC | Status: AC
  Administered 2013-01-11 (×3): 1 [drp] via OPHTHALMIC

## 2013-01-11 MED ORDER — LIDOCAINE HCL (PF) 1 % IJ SOLN
INTRAMUSCULAR | Status: DC | PRN
  Administered 2013-01-11: .6 mL

## 2013-01-11 MED ORDER — LACTATED RINGERS IV SOLN
INTRAVENOUS | Status: DC
  Administered 2013-01-11: 10:00:00 via INTRAVENOUS

## 2013-01-11 SURGICAL SUPPLY — 32 items

## 2013-01-11 NOTE — Anesthesia Procedure Notes (Signed)
Procedure Name: MAC Date/Time: 01/11/2013 9:50 AM Performed by: Vista Deck Pre-anesthesia Checklist: Patient identified, Emergency Drugs available, Suction available, Timeout performed and Patient being monitored Patient Re-evaluated:Patient Re-evaluated prior to inductionOxygen Delivery Method: Nasal Cannula

## 2013-01-11 NOTE — Anesthesia Postprocedure Evaluation (Signed)
  Anesthesia Post-op Note  Patient: Billy [name]  Procedure(s) Performed: Procedure(s) (LRB): CATARACT EXTRACTION PHACO AND INTRAOCULAR LENS PLACEMENT (IOC) (Left)  Patient Location:  Short Stay  Anesthesia Type: MAC  Level of Consciousness: awake  Airway and Oxygen Therapy: Patient Spontanous Breathing  Post-op Pain: none  Post-op Assessment: Post-op Vital signs reviewed, Patient's Cardiovascular Status Stable, Respiratory Function Stable, Patent Airway, No signs of Nausea or vomiting and Pain level controlled  Post-op Vital Signs: Reviewed and stable  Complications: No apparent anesthesia complications

## 2013-01-11 NOTE — Anesthesia Preprocedure Evaluation (Signed)
Anesthesia Evaluation  Patient identified by MRN, date of birth, ID band Patient awake    Reviewed: Allergy & Precautions, H&P , NPO status , Patient's Chart, lab work & pertinent test results  Airway Mallampati: II TM Distance: >3 FB     Dental  (+) Edentulous Upper and Edentulous Lower   Pulmonary former smoker,  breath sounds clear to auscultation        Cardiovascular hypertension, Pt. on medications + CAD, + CABG and + Peripheral Vascular Disease + dysrhythmias Rhythm:Regular Rate:Bradycardia     Neuro/Psych    GI/Hepatic negative GI ROS,   Endo/Other    Renal/GU      Musculoskeletal   Abdominal   Peds  Hematology   Anesthesia Other Findings   Reproductive/Obstetrics                           Anesthesia Physical Anesthesia Plan  ASA: III  Anesthesia Plan: MAC   Post-op Pain Management:    Induction: Intravenous  Airway Management Planned: Nasal Cannula  Additional Equipment:   Intra-op Plan:   Post-operative Plan:   Informed Consent: I have reviewed the patients History and Physical, chart, labs and discussed the procedure including the risks, benefits and alternatives for the proposed anesthesia with the patient or authorized representative who has indicated his/her understanding and acceptance.     Plan Discussed with:   Anesthesia Plan Comments:         Anesthesia Quick Evaluation

## 2013-01-11 NOTE — Transfer of Care (Signed)
Immediate Anesthesia Transfer of Care Note  Patient: Billy [name]  Procedure(s) Performed: Procedure(s) (LRB): CATARACT EXTRACTION PHACO AND INTRAOCULAR LENS PLACEMENT (IOC) (Left)  Patient Location: Shortstay  Anesthesia Type: MAC  Level of Consciousness: awake  Airway & Oxygen Therapy: Patient Spontanous Breathing   Post-op Assessment: Report given to PACU RN, Post -op Vital signs reviewed and stable and Patient moving all extremities  Post vital signs: Reviewed and stable  Complications: No apparent anesthesia complications

## 2013-01-11 NOTE — H&P (Signed)
I have reviewed the H&P, the patient was re-examined, and I have identified no interval changes in medical condition and plan of care since the history and physical of record  

## 2013-01-11 NOTE — Op Note (Signed)
Date of Admission: 01/11/2013  Date of Surgery: 01/11/2013  Pre-Op Dx: Cataract  Left  Eye  Post-Op Dx: Cataract  Left  Eye,  Dx Code 366.16  Surgeon: Tonny Branch, M.D.  Assistants: None  Anesthesia: Topical with MAC  Indications: Painless, progressive loss of vision with compromise of daily activities.  Surgery: Cataract Extraction with Intraocular lens Implant Left Eye  Discription: The patient had dilating drops and viscous lidocaine placed into the left eye in the pre-op holding area. After transfer to the operating room, a time out was performed. The patient was then prepped and draped. Beginning with a 37 degree blade a paracentesis port was made at the surgeon's 2 o'clock position. The anterior chamber was then filled with 1% non-preserved lidocaine. This was followed by filling the anterior chamber with Provisc. A bent cystatome needle was used to create a continuous tear capsulotomy. Hydrodissection was performed with balanced salt solution on a Fine canula. The lens nucleus was then removed using the phacoemulsification handpiece. Residual cortex was removed with the I&A handpiece. The anterior chamber and capsular bag were refilled with Provisc. A posterior chamber intraocular lens was placed into the capsular bag with it's injector. The implant was positioned with the Kuglan hook. The Provisc was then removed from the anterior chamber and capsular bag with the I&A handpiece. Stromal hydration of the main incision and paracentesis port was performed with BSS on a Fine canula. The wounds were tested for leak which was negative. The patient tolerated the procedure well. There were no operative complications. The patient was then transferred to the recovery room in stable condition.  Complications: None  Specimen: None  EBL: None  Prosthetic device: B&L enVista, MX60, power 21.0D, SN [id number].

## 2013-01-12 ENCOUNTER — Encounter (HOSPITAL_COMMUNITY): Payer: Self-pay | Admitting: Ophthalmology

## 2013-02-12 ENCOUNTER — Other Ambulatory Visit: Payer: Self-pay | Admitting: *Deleted

## 2013-04-11 ENCOUNTER — Ambulatory Visit: Payer: Medicare Other | Admitting: Cardiology

## 2013-04-19 ENCOUNTER — Encounter: Payer: Self-pay | Admitting: Cardiology

## 2013-04-19 ENCOUNTER — Ambulatory Visit (INDEPENDENT_AMBULATORY_CARE_PROVIDER_SITE_OTHER): Payer: Medicare Other | Admitting: Cardiology

## 2013-04-19 VITALS — BP 129/66 | HR 68 | Ht 67.5 in | Wt 147.0 lb

## 2013-04-19 DIAGNOSIS — E785 Hyperlipidemia, unspecified: Secondary | ICD-10-CM

## 2013-04-19 DIAGNOSIS — I10 Essential (primary) hypertension: Secondary | ICD-10-CM

## 2013-04-19 DIAGNOSIS — I779 Disorder of arteries and arterioles, unspecified: Secondary | ICD-10-CM

## 2013-04-19 DIAGNOSIS — I2510 Atherosclerotic heart disease of native coronary artery without angina pectoris: Secondary | ICD-10-CM

## 2013-04-19 NOTE — Progress Notes (Signed)
Clinical Summary Billy [name] is a 78 y.o.male last seen in November 2013 by Mr. Serpe PA-C, a former patient of Billy [name]. He is here with his wife. 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  Lexiscan Cardiolite in 2013 showed no ischemia. Carotid Dopplers from last December are reviewed below.  He does have trouble with leg cramps and weakness at times. Has been on Zocor long-term. No recent lipid panel.  ECG today shows normal sinus rhythm.   No Known Allergies  Current Outpatient Prescriptions  Medication Sig Dispense Refill  . aspirin EC 81 MG tablet Take 81 mg by mouth daily.        Marland Kitchen COLCRYS 0.6 MG tablet Take 1 tablet by mouth Once daily as needed.      . cyclobenzaprine (FLEXERIL) 5 MG tablet Take 5 mg by mouth at bedtime.      . diphenhydrAMINE (BENADRYL) 25 MG tablet Take 25 mg by mouth every 6 (six) hours as needed.      Marland Kitchen ibuprofen (ADVIL,MOTRIN) 200 MG tablet Take 200 mg by mouth every 6 (six) hours as needed.      . isosorbide mononitrate (IMDUR) 30 MG 24 hr tablet Take 1 tablet (30 mg total) by mouth daily.  90 tablet  3  . lisinopril (PRINIVIL,ZESTRIL) 20 MG tablet Take 20 mg by mouth daily.      . nitroGLYCERIN (NITROSTAT) 0.4 MG SL tablet Place 1 tablet (0.4 mg total) under the tongue every 5 (five) minutes as needed.  25 tablet  3  . Saw Palmetto, Serenoa repens, 450 MG CAPS Take by mouth. Take 1 capsule in AM and 2 capsules in PM      . simvastatin (ZOCOR) 40 MG tablet Take 1 tablet (40 mg total) by mouth every evening.  90 tablet  3   No current facility-administered medications for this visit.    Past Medical History  Diagnosis Date  . Coronary atherosclerosis of native coronary artery     Multivessel status post CABG  . Essential hypertension, benign   . Carotid artery disease     123456 LICA and [id number] RICA - December 2013  . Sinus bradycardia     Asymptomatic  . Gout     . HOH (hard of hearing)     Past Surgical History  Procedure Laterality Date  . Coronary artery bypass graft  11/09/1999    LIMA to LAD, SVG to diagonal, SVG to OM1 and OM2, SVG to PDA  . Appendectomy      Ruptured  . Cataract extraction w/phaco Right 11/13/2012    Procedure: CATARACT EXTRACTION PHACO AND INTRAOCULAR LENS PLACEMENT (IOC);  Surgeon: Tonny Branch, MD;  Location: [facility];  Service: Ophthalmology;  Laterality: Right;  CDE:12.75  . Cataract extraction w/phaco Left 01/11/2013    Procedure: CATARACT EXTRACTION PHACO AND INTRAOCULAR LENS PLACEMENT (IOC);  Surgeon: Tonny Branch, MD;  Location: [facility];  Service: Ophthalmology;  Laterality: Left;  CDE: 21.13    Social History Billy [name] reports that he quit smoking about 13 years ago. His smoking use included Cigarettes. He has a 25 pack-year smoking history. He has never used smokeless tobacco. Billy [name] reports that he drinks alcohol.  Review of Systems No palpitations or syncope. Does have dizziness sometimes when he stands up quickly. This has been a long-term problem. No reported bleeding problems. Does have erectile dysfunction. Otherwise negative.  Physical  Examination Filed Vitals:   04/19/13 0905  BP: 129/66  Pulse: 68   Filed Weights   04/19/13 0905  Weight: 147 lb (66.679 kg)   Patient appears comfortable at rest. HEENT: Conjunctiva and lids normal, oropharynx clear. Neck: Supple, no elevated JVP or carotid bruits, no thyromegaly. Lungs: Clear to auscultation, nonlabored breathing at rest. Cardiac: Regular rate and rhythm, no S3 or significant systolic murmur, no pericardial rub. Abdomen: Soft, nontender, bowel sounds present, no guarding or rebound. Extremities: No pitting edema, distal pulses 2+. Skin: Warm and dry. Musculoskeletal: No kyphosis. Neuropsychiatric: Alert and oriented x3, affect grossly appropriate.   Problem List and Plan   CAD, NATIVE VESSEL Symptomatically well controlled on medical therapy,  low risk Cardiolite 2013. ECG reviewed. Continue annual followup.  HYPERLIPIDEMIA-MIXED Check FLP and LFT. Depending on results, we might try and cut Zocor back to 20 mg daily to see if this helps with any of his leg discomfort.  Essential hypertension, benign No change to antihypertensive regimen.  Carotid artery disease without cerebral infarction Followup carotid Dopplers to be obtained.    Satira Sark, M.D., F.A.C.C.

## 2013-04-19 NOTE — Assessment & Plan Note (Signed)
Symptomatically well controlled on medical therapy, low risk Cardiolite 2013. ECG reviewed. Continue annual followup.

## 2013-04-19 NOTE — Assessment & Plan Note (Signed)
No change to antihypertensive regimen.

## 2013-04-19 NOTE — Patient Instructions (Addendum)
   Labs for FLP/LFT - Reminder:  Nothing to eat or drink after 12 midnight prior to labs. Your physician has requested that you have a carotid duplex. This test is an ultrasound of the carotid arteries in your neck. It looks at blood flow through these arteries that supply the brain with blood. Allow one hour for this exam. There are no restrictions or special instructions. Office will contact with results via phone or letter.   Continue all current medications. Your physician wants you to follow up in:  1 year.  You will receive a reminder letter in the mail one-two months in advance.  If you don't receive a letter, please call our office to schedule the follow up appointment

## 2013-04-19 NOTE — Assessment & Plan Note (Signed)
Check FLP and LFT. Depending on results, we might try and cut Zocor back to 20 mg daily to see if this helps with any of his leg discomfort.

## 2013-04-19 NOTE — Assessment & Plan Note (Signed)
Followup carotid Dopplers to be obtained.

## 2013-04-20 ENCOUNTER — Ambulatory Visit: Payer: Medicare Other | Admitting: Cardiology

## 2013-05-02 ENCOUNTER — Encounter (INDEPENDENT_AMBULATORY_CARE_PROVIDER_SITE_OTHER): Payer: Medicare Other

## 2013-05-02 ENCOUNTER — Telehealth: Payer: Self-pay | Admitting: *Deleted

## 2013-05-02 DIAGNOSIS — I779 Disorder of arteries and arterioles, unspecified: Secondary | ICD-10-CM

## 2013-05-02 DIAGNOSIS — I6529 Occlusion and stenosis of unspecified carotid artery: Secondary | ICD-10-CM

## 2013-05-02 NOTE — Telephone Encounter (Signed)
Message copied by Laurine Blazer on Wed May 02, 2013  3:41 PM ------      Message from: MCDOWELL, Aloha Gell      Created: Wed May 02, 2013 10:59 AM       Reviewed. Mild to moderate plaque as outlined, continue medical therapy, anticipate followup Doppler in one year. ------

## 2013-05-02 NOTE — Telephone Encounter (Signed)
Notes Recorded by Laurine Blazer, LPN on [id number] at 3:41 PM Wife (Wand) notified.

## 2013-05-17 ENCOUNTER — Telehealth: Payer: Self-pay | Admitting: *Deleted

## 2013-05-17 MED ORDER — SIMVASTATIN 20 MG PO TABS: 40.0000 mg | ORAL_TABLET | Freq: Every evening | ORAL | Status: DC

## 2013-05-17 NOTE — Telephone Encounter (Signed)
Message copied by Lewayne Bunting on Thu May 17, 2013  5:10 PM ------      Message from: Satira Sark      Created: Tue May 15, 2013  8:02 AM       Reviewed. AST 34, ALT 46, LDL 66. As per recent note, could consider cutting Zocor dose in half to see if this helps with any potential side effects. His lipids are well controlled. ------

## 2013-05-17 NOTE — Telephone Encounter (Signed)
Message copied by Merlene Laughter on Thu May 17, 2013  4:08 PM ------      Message from: Satira Sark      Created: Tue May 15, 2013  8:02 AM       Reviewed. AST 34, ALT 46, LDL 66. As per recent note, could consider cutting Zocor dose in half to see if this helps with any potential side effects. His lipids are well controlled. ------

## 2013-05-17 NOTE — Telephone Encounter (Signed)
Informed pt wife of below.

## 2013-06-04 ENCOUNTER — Telehealth: Payer: Self-pay | Admitting: Cardiology

## 2013-06-04 MED ORDER — ISOSORBIDE MONONITRATE ER 30 MG PO TB24: 30.0000 mg | ORAL_TABLET | Freq: Every day | ORAL | Status: DC

## 2013-06-04 MED ORDER — SIMVASTATIN 20 MG PO TABS: 40.0000 mg | ORAL_TABLET | Freq: Every evening | ORAL | Status: DC

## 2013-06-04 NOTE — Telephone Encounter (Signed)
Received fax refill request  Rx #  Medication:  Isosorb Mono tab 30 mg tab ER Qty 90 Sig:  Take one by mouth daily Physician:  Domenic Polite  Received fax refill request  Rx #  Medication:  Simvastatin tab 40 mg  Qty 90 Sig:  Take one by mouth every evening Physician:  Domenic Polite

## 2013-06-04 NOTE — Telephone Encounter (Signed)
rx faxed per request

## 2013-06-08 ENCOUNTER — Telehealth: Payer: Self-pay | Admitting: *Deleted

## 2013-06-08 ENCOUNTER — Other Ambulatory Visit: Payer: Self-pay

## 2013-06-08 MED ORDER — SIMVASTATIN 20 MG PO TABS: 40.0000 mg | ORAL_TABLET | Freq: Every evening | ORAL | Status: DC

## 2013-06-08 MED ORDER — ISOSORBIDE MONONITRATE ER 30 MG PO TB24: 30.0000 mg | ORAL_TABLET | Freq: Every day | ORAL | Status: DC

## 2013-06-08 NOTE — Telephone Encounter (Signed)
[facility] fax " in box" needs signature  isosorb mono 30mg  #90 Simvastatin 40 mg #90

## 2013-06-11 NOTE — Telephone Encounter (Signed)
Fax sent to [facility] office.

## 2013-06-11 NOTE — Telephone Encounter (Signed)
CLARIFICATION FORM [facility] F [phone] P [phone] In box  On simvastatin

## 2013-06-13 ENCOUNTER — Other Ambulatory Visit: Payer: Self-pay | Admitting: *Deleted

## 2013-06-13 MED ORDER — ATORVASTATIN CALCIUM 40 MG PO TABS
20.0000 mg | ORAL_TABLET | Freq: Every day | ORAL | Status: DC
Start: 2013-06-13 — End: 2013-06-15

## 2013-06-13 NOTE — Telephone Encounter (Signed)
Received clarification request from [facility] to verify dosage. Nurse called patient to confirm and patient is currently taking atovastatin 40 mg breaking it in half daily.

## 2013-06-15 ENCOUNTER — Telehealth: Payer: Self-pay | Admitting: Cardiology

## 2013-06-15 MED ORDER — ATORVASTATIN CALCIUM 40 MG PO TABS: 20.0000 mg | ORAL_TABLET | Freq: Every day | ORAL | Status: DC

## 2013-06-15 MED ORDER — ISOSORBIDE MONONITRATE ER 30 MG PO TB24: 30.0000 mg | ORAL_TABLET | Freq: Every day | ORAL | Status: DC

## 2013-06-15 MED ORDER — NITROGLYCERIN 0.4 MG SL SUBL: 0.4000 mg | SUBLINGUAL_TABLET | SUBLINGUAL | Status: DC | PRN

## 2013-06-15 MED ORDER — LISINOPRIL 20 MG PO TABS: 20.0000 mg | ORAL_TABLET | Freq: Every day | ORAL | Status: DC

## 2013-06-15 NOTE — Telephone Encounter (Signed)
Sent medication refill to new pharmacy

## 2013-08-06 ENCOUNTER — Other Ambulatory Visit: Payer: Self-pay | Admitting: Cardiology

## 2013-08-06 MED ORDER — SIMVASTATIN 40 MG PO TABS: 20.0000 mg | ORAL_TABLET | Freq: Every day | ORAL | Status: DC

## 2013-08-06 MED ORDER — LISINOPRIL 20 MG PO TABS: 20.0000 mg | ORAL_TABLET | Freq: Every day | ORAL | Status: DC

## 2013-08-09 ENCOUNTER — Telehealth: Payer: Self-pay | Admitting: *Deleted

## 2013-08-09 NOTE — Telephone Encounter (Signed)
Called to confirm that simvastatin prescription was sent to pharmacy. Nurse informed wife that simvastatin was sent on Monday. Nurse also confirmed that patient is no longer taking atorvastatin. Per wife, patient is no longer taking atorvastatin and is taking simvastatin only for cholesterol.

## 2013-09-05 HISTORY — PX: COLONOSCOPY: SHX174

## 2014-04-08 ENCOUNTER — Encounter: Payer: Self-pay | Admitting: Cardiology

## 2014-04-08 ENCOUNTER — Encounter: Payer: Self-pay | Admitting: *Deleted

## 2014-04-08 ENCOUNTER — Ambulatory Visit (INDEPENDENT_AMBULATORY_CARE_PROVIDER_SITE_OTHER): Payer: Medicare HMO | Admitting: Cardiology

## 2014-04-08 VITALS — BP 136/70 | HR 74 | Ht 67.0 in | Wt 142.1 lb

## 2014-04-08 DIAGNOSIS — I10 Essential (primary) hypertension: Secondary | ICD-10-CM

## 2014-04-08 DIAGNOSIS — I2510 Atherosclerotic heart disease of native coronary artery without angina pectoris: Secondary | ICD-10-CM

## 2014-04-08 DIAGNOSIS — I739 Peripheral vascular disease, unspecified: Secondary | ICD-10-CM

## 2014-04-08 DIAGNOSIS — I779 Disorder of arteries and arterioles, unspecified: Secondary | ICD-10-CM

## 2014-04-08 DIAGNOSIS — E782 Mixed hyperlipidemia: Secondary | ICD-10-CM

## 2014-04-08 NOTE — Assessment & Plan Note (Signed)
Blood pressure is reasonable well controlled today. No changes made.

## 2014-04-08 NOTE — Patient Instructions (Signed)
Your physician recommends that you schedule a follow-up appointment in: 1 year. You will receive a reminder letter in the mail in about 10 months reminding you to call and schedule your appointment. If you don't receive this letter, please contact our office. Your physician recommends that you continue on your current medications as directed. Please refer to the Current Medication list given to you today. Your physician has requested that you have a carotid duplex. This test is an ultrasound of the carotid arteries in your neck. It looks at blood flow through these arteries that supply the brain with blood. Allow one hour for this exam. There are no restrictions or special instructions. Your physician has requested that you have a lexiscan myoview. For further information please visit [email or web address]. Please follow instruction sheet, as given.

## 2014-04-08 NOTE — Assessment & Plan Note (Signed)
Last LDL 66 on Zocor.

## 2014-04-08 NOTE — Assessment & Plan Note (Signed)
Multivessel disease status post CABG in 2001. He is reporting increasing angina symptoms as outlined. We will obtain a Lexiscan Cardiolite for reassessment of ischemic burden on medical therapy.

## 2014-04-08 NOTE — Progress Notes (Signed)
Reason for visit: CAD, hypertension, carotid artery disease  Clinical Summary Billy [name] is a 79 y.o.male last seen in November 2014. He is here with his wife. He reports episodic moderate angina symptoms with higher levels of activity, seems to be more frequent and intense than last year. States for instance it occurred doing outdoor work in the heat, he was also up on a roof this summer with symptoms. ECG today shows sinus rhythm with decreased R wave progression and nonspecific ST changes.  Lab work from December 2014 showed AST 34, ALT 46, triglycerides 145, cholesterol 129, LDL 66, HDL 34. Carotid Dopplers from November 2014 showed 1-39% RICA stenosis, and 123456 LICA stenosis.  Lexiscan Cardiolite in 2013 showed no ischemia. No ischemic follow-up since that time.  Also reports having right leg pain that was present for about 3 days, then resolved spontaneously. Not related to exertion, no swelling or redness.  No Known Allergies  Current Outpatient Prescriptions  Medication Sig Dispense Refill  . aspirin EC 81 MG tablet Take 81 mg by mouth daily.      Marland Kitchen COLCRYS 0.6 MG tablet Take 1 tablet by mouth Once daily as needed.    . cyclobenzaprine (FLEXERIL) 5 MG tablet Take 5 mg by mouth at bedtime.    Marland Kitchen ibuprofen (ADVIL,MOTRIN) 200 MG tablet Take 200 mg by mouth every 6 (six) hours as needed.    . isosorbide mononitrate (IMDUR) 30 MG 24 hr tablet Take 1 tablet (30 mg total) by mouth daily. 90 tablet 3  . lisinopril (PRINIVIL,ZESTRIL) 20 MG tablet Take 1 tablet (20 mg total) by mouth daily. 90 tablet 3  . nitroGLYCERIN (NITROSTAT) 0.4 MG SL tablet Place 1 tablet (0.4 mg total) under the tongue every 5 (five) minutes as needed. 25 tablet 3  . Saw Palmetto, Serenoa repens, 450 MG CAPS Take by mouth. Take 1 capsule in AM and 2 capsules in PM    . simvastatin (ZOCOR) 40 MG tablet Take 0.5 tablets (20 mg total) by mouth daily. 45 tablet 3   No current facility-administered medications for this  visit.    Past Medical History  Diagnosis Date  . Coronary atherosclerosis of native coronary artery     Multivessel status post CABG  . Essential hypertension, benign   . Carotid artery disease     123456 LICA and [id number] RICA - December 2013  . Sinus bradycardia     Asymptomatic  . Gout   . HOH (hard of hearing)     Past Surgical History  Procedure Laterality Date  . Coronary artery bypass graft  11/09/1999    LIMA to LAD, SVG to diagonal, SVG to OM1 and OM2, SVG to PDA  . Appendectomy      Ruptured  . Cataract extraction w/phaco Right 11/13/2012    Procedure: CATARACT EXTRACTION PHACO AND INTRAOCULAR LENS PLACEMENT (IOC);  Surgeon: Tonny Branch, MD;  Location: [facility];  Service: Ophthalmology;  Laterality: Right;  CDE:12.75  . Cataract extraction w/phaco Left 01/11/2013    Procedure: CATARACT EXTRACTION PHACO AND INTRAOCULAR LENS PLACEMENT (IOC);  Surgeon: Tonny Branch, MD;  Location: [facility];  Service: Ophthalmology;  Laterality: Left;  CDE: 21.13    Social History Billy [name] reports that he quit smoking about 14 years ago. His smoking use included Cigarettes. He has a 25 pack-year smoking history. He has never used smokeless tobacco. Billy [name] reports that he drinks alcohol.  Review of Systems Complete review of systems negative except as otherwise outlined in  the clinical summary.  Physical Examination Filed Vitals:   04/08/14 1039  BP: 136/70  Pulse: 74   Filed Weights   04/08/14 1039  Weight: 142 lb 1.9 oz (64.465 kg)    Patient appears comfortable at rest. HEENT: Conjunctiva and lids normal, oropharynx clear. Neck: Supple, no elevated JVP, soft left carotid bruit, no thyromegaly. Lungs: Clear to auscultation, nonlabored breathing at rest. Cardiac: Regular rate and rhythm, no S3 or significant systolic murmur, no pericardial rub. Abdomen: Soft, nontender, bowel sounds present, no guarding or rebound. Extremities: No pitting edema, distal pulses 2+. Skin: Warm and  dry. Musculoskeletal: No kyphosis. Neuropsychiatric: Alert and oriented x3, affect grossly appropriate.   Problem List and Plan   CAD, NATIVE VESSEL Multivessel disease status post CABG in 2001. He is reporting increasing angina symptoms as outlined. We will obtain a Lexiscan Cardiolite for reassessment of ischemic burden on medical therapy.  Carotid artery disease without cerebral infarction Moderate bilateral disease, left greater than right by carotid Dopplers last year. Follow-up study will be obtained. He continues on aspirin and statin.  Essential hypertension, benign Blood pressure is reasonable well controlled today. No changes made.  Mixed hyperlipidemia Last LDL 66 on Zocor.    Satira Sark, M.D., F.A.C.C.

## 2014-04-08 NOTE — Assessment & Plan Note (Signed)
Moderate bilateral disease, left greater than right by carotid Dopplers last year. Follow-up study will be obtained. He continues on aspirin and statin.

## 2014-04-11 ENCOUNTER — Encounter (INDEPENDENT_AMBULATORY_CARE_PROVIDER_SITE_OTHER): Payer: Medicare HMO

## 2014-04-11 DIAGNOSIS — I779 Disorder of arteries and arterioles, unspecified: Secondary | ICD-10-CM

## 2014-04-11 DIAGNOSIS — I739 Peripheral vascular disease, unspecified: Principal | ICD-10-CM

## 2014-04-11 DIAGNOSIS — I6529 Occlusion and stenosis of unspecified carotid artery: Secondary | ICD-10-CM

## 2014-04-18 ENCOUNTER — Encounter (HOSPITAL_COMMUNITY): Payer: Self-pay

## 2014-04-18 ENCOUNTER — Ambulatory Visit (HOSPITAL_COMMUNITY)
Admission: RE | Admit: 2014-04-18 | Discharge: 2014-04-18 | Disposition: A | Discharge status: Home / Self Care | Payer: Medicare HMO | Source: Ambulatory Visit | Attending: Cardiology | Admitting: Cardiology

## 2014-04-18 ENCOUNTER — Encounter (HOSPITAL_COMMUNITY)
Admission: RE | Admit: 2014-04-18 | Discharge: 2014-04-18 | Disposition: A | Discharge status: Home / Self Care | Payer: Medicare HMO | Source: Ambulatory Visit | Attending: Cardiology | Admitting: Cardiology

## 2014-04-18 ENCOUNTER — Telehealth: Payer: Self-pay | Admitting: *Deleted

## 2014-04-18 DIAGNOSIS — I2510 Atherosclerotic heart disease of native coronary artery without angina pectoris: Secondary | ICD-10-CM | POA: Diagnosis not present

## 2014-04-18 MED ORDER — REGADENOSON 0.4 MG/5ML IV SOLN
INTRAVENOUS | Status: AC
  Administered 2014-04-18: 0.4 mg via INTRAVENOUS
  Filled 2014-04-18: qty 5

## 2014-04-18 MED ORDER — SODIUM CHLORIDE 0.9 % IJ SOLN
10.0000 mL | INTRAMUSCULAR | Status: DC | PRN
  Administered 2014-04-18: 10 mL via INTRAVENOUS
  Filled 2014-04-18: qty 10

## 2014-04-18 MED ORDER — TECHNETIUM TC 99M SESTAMIBI - CARDIOLITE
30.0000 | Freq: Once | INTRAVENOUS | Status: AC | PRN
  Administered 2014-04-18: 30 via INTRAVENOUS

## 2014-04-18 MED ORDER — TECHNETIUM TC 99M SESTAMIBI GENERIC - CARDIOLITE
10.0000 | Freq: Once | INTRAVENOUS | Status: AC | PRN
  Administered 2014-04-18: 10 via INTRAVENOUS

## 2014-04-18 MED ORDER — REGADENOSON 0.4 MG/5ML IV SOLN
0.4000 mg | Freq: Once | INTRAVENOUS | Status: AC | PRN
  Administered 2014-04-18: 0.4 mg via INTRAVENOUS

## 2014-04-18 MED ORDER — SODIUM CHLORIDE 0.9 % IJ SOLN
INTRAMUSCULAR | Status: AC
  Administered 2014-04-18: 10 mL via INTRAVENOUS
  Filled 2014-04-18: qty 10

## 2014-04-18 NOTE — Progress Notes (Signed)
Stress Lab Nurses Notes - Solo Wilcock 04/18/2014 Reason for doing test: CAD Type of test: Wille Glaser Nurse performing test: Gerrit Halls, RN Nuclear Medicine Tech: Redmond Baseman Echo Tech: Not Applicable MD performing test: S. McDowell/K.Purcell Nails NP Family MD: Dr. Pleas Koch Test explained and consent signed: Yes.   IV started: Saline lock flushed, No redness or edema and Saline lock started in radiology Symptoms: "not sure what it was? " Treatment/Intervention: None Reason test stopped: protocol completed After recovery IV was: Discontinued via X-ray tech and No redness or edema Patient to return to [facility]. Med at : 11:45 Patient discharged: Home Patient's Condition upon discharge was: stable Comments: During test BP 163/65 & HR 87.  Recovery BP 184/75 & HR 78.  Symptoms resolved in recovery. Geanie Cooley T

## 2014-04-18 NOTE — Telephone Encounter (Signed)
-----   Message from Satira Sark, MD sent at 04/13/2014  3:51 PM EST ----- Reviewed report. Study indicates 1-39% bilateral ICA stenoses, actually somewhat less than indicated by the prior study. Continue observation on current medications.

## 2014-04-19 MED ORDER — ISOSORBIDE MONONITRATE ER 60 MG PO TB24: 60.0000 mg | ORAL_TABLET | Freq: Every day | ORAL | Status: DC

## 2014-04-19 NOTE — Telephone Encounter (Signed)
-----   Message from Merlene Laughter, LPN sent at [id number]  7:57 AM EST -----   ----- Message -----    From: Satira Sark, MD    Sent: 04/19/2014   7:45 AM      To: Bernita Raisin, RN  Reviewed. Please let him know that the study does suggest some change in his coronary anatomy over the last several years since CABG. He could have had the development of graft disease. Next step depends somewhat on his angina symptoms. We could try to increase his Imdur to 60 mg daily to see if this helps. If symptoms continue to worsen however, heart catheterization would be pursued to look for revascularization options.

## 2014-04-19 NOTE — Telephone Encounter (Signed)
Patient informed and verbalized understanding of plan. 

## 2014-04-22 ENCOUNTER — Encounter (HOSPITAL_COMMUNITY): Payer: Self-pay | Admitting: Physician Assistant

## 2014-04-22 ENCOUNTER — Encounter (HOSPITAL_COMMUNITY)
Admission: EM | Disposition: A | Discharge status: Home / Self Care | Payer: Medicare HMO | Source: Other Acute Inpatient Hospital | Attending: Cardiovascular Disease

## 2014-04-22 ENCOUNTER — Other Ambulatory Visit: Payer: Self-pay

## 2014-04-22 ENCOUNTER — Inpatient Hospital Stay (HOSPITAL_COMMUNITY)
Admission: EM | Admit: 2014-04-22 | Discharge: 2014-04-26 | DRG: 247 | Disposition: A | Discharge status: Home / Self Care | Payer: Medicare HMO | Source: Other Acute Inpatient Hospital | Attending: Cardiovascular Disease | Admitting: Cardiovascular Disease

## 2014-04-22 DIAGNOSIS — Z79899 Other long term (current) drug therapy: Secondary | ICD-10-CM | POA: Diagnosis not present

## 2014-04-22 DIAGNOSIS — R7309 Other abnormal glucose: Secondary | ICD-10-CM | POA: Diagnosis present

## 2014-04-22 DIAGNOSIS — I25719 Atherosclerosis of autologous vein coronary artery bypass graft(s) with unspecified angina pectoris: Secondary | ICD-10-CM

## 2014-04-22 DIAGNOSIS — N183 Chronic kidney disease, stage 3 unspecified: Secondary | ICD-10-CM | POA: Diagnosis present

## 2014-04-22 DIAGNOSIS — I25810 Atherosclerosis of coronary artery bypass graft(s) without angina pectoris: Secondary | ICD-10-CM | POA: Diagnosis present

## 2014-04-22 DIAGNOSIS — I2119 ST elevation (STEMI) myocardial infarction involving other coronary artery of inferior wall: Secondary | ICD-10-CM | POA: Diagnosis present

## 2014-04-22 DIAGNOSIS — I2121 ST elevation (STEMI) myocardial infarction involving left circumflex coronary artery: Secondary | ICD-10-CM

## 2014-04-22 DIAGNOSIS — Z951 Presence of aortocoronary bypass graft: Secondary | ICD-10-CM

## 2014-04-22 DIAGNOSIS — K709 Alcoholic liver disease, unspecified: Secondary | ICD-10-CM | POA: Diagnosis present

## 2014-04-22 DIAGNOSIS — Z7982 Long term (current) use of aspirin: Secondary | ICD-10-CM

## 2014-04-22 DIAGNOSIS — R7401 Elevation of levels of liver transaminase levels: Secondary | ICD-10-CM | POA: Diagnosis present

## 2014-04-22 DIAGNOSIS — Z87891 Personal history of nicotine dependence: Secondary | ICD-10-CM | POA: Diagnosis not present

## 2014-04-22 DIAGNOSIS — H9190 Unspecified hearing loss, unspecified ear: Secondary | ICD-10-CM | POA: Diagnosis present

## 2014-04-22 DIAGNOSIS — I2582 Chronic total occlusion of coronary artery: Secondary | ICD-10-CM | POA: Diagnosis present

## 2014-04-22 DIAGNOSIS — N179 Acute kidney failure, unspecified: Secondary | ICD-10-CM | POA: Diagnosis not present

## 2014-04-22 DIAGNOSIS — I214 Non-ST elevation (NSTEMI) myocardial infarction: Secondary | ICD-10-CM | POA: Diagnosis present

## 2014-04-22 DIAGNOSIS — I129 Hypertensive chronic kidney disease with stage 1 through stage 4 chronic kidney disease, or unspecified chronic kidney disease: Secondary | ICD-10-CM | POA: Diagnosis present

## 2014-04-22 DIAGNOSIS — D649 Anemia, unspecified: Secondary | ICD-10-CM | POA: Diagnosis present

## 2014-04-22 DIAGNOSIS — R740 Nonspecific elevation of levels of transaminase and lactic acid dehydrogenase [LDH]: Secondary | ICD-10-CM | POA: Diagnosis present

## 2014-04-22 DIAGNOSIS — E782 Mixed hyperlipidemia: Secondary | ICD-10-CM | POA: Diagnosis present

## 2014-04-22 DIAGNOSIS — I10 Essential (primary) hypertension: Secondary | ICD-10-CM | POA: Diagnosis present

## 2014-04-22 DIAGNOSIS — Z791 Long term (current) use of non-steroidal anti-inflammatories (NSAID): Secondary | ICD-10-CM

## 2014-04-22 DIAGNOSIS — R7303 Prediabetes: Secondary | ICD-10-CM | POA: Diagnosis present

## 2014-04-22 DIAGNOSIS — Z955 Presence of coronary angioplasty implant and graft: Secondary | ICD-10-CM | POA: Insufficient documentation

## 2014-04-22 HISTORY — PX: LEFT HEART CATHETERIZATION WITH CORONARY ANGIOGRAM: SHX5451

## 2014-04-22 LAB — POCT ACTIVATED CLOTTING TIME
Activated Clotting Time: 309 seconds
Activated Clotting Time: 118 seconds

## 2014-04-22 LAB — MRSA PCR SCREENING: MRSA by PCR: NEGATIVE

## 2014-04-22 LAB — BASIC METABOLIC PANEL
Anion gap: 17 — ABNORMAL HIGH (ref 5–15)
BUN: 24 mg/dL — ABNORMAL HIGH (ref 6–23)
CO2: 19 mEq/L (ref 19–32)
Calcium: 9.1 mg/dL (ref 8.4–10.5)
Chloride: 105 mEq/L (ref 96–112)
Creatinine, Ser: 1.23 mg/dL (ref 0.50–1.35)
GFR calc Af Amer: 64 mL/min — ABNORMAL LOW (ref >90)
GFR calc non Af Amer: 55 mL/min — ABNORMAL LOW (ref >90)
Glucose, Bld: 130 mg/dL — ABNORMAL HIGH (ref 70–99)
Potassium: 4.2 mEq/L (ref 3.7–5.3)
Sodium: 141 mEq/L (ref 137–147)

## 2014-04-22 LAB — CBC
HCT: 33.9 % — ABNORMAL LOW (ref 39.0–52.0)
Hemoglobin: 11.4 g/dL — ABNORMAL LOW (ref 13.0–17.0)
MCH: 28.7 pg (ref 26.0–34.0)
MCHC: 33.6 g/dL (ref 30.0–36.0)
MCV: 85.4 fL (ref 78.0–100.0)
Platelets: 275 10*3/uL (ref 150–400)
RBC: 3.97 MIL/uL — ABNORMAL LOW (ref 4.22–5.81)
RDW: 13.1 % (ref 11.5–15.5)
WBC: 8 10*3/uL (ref 4.0–10.5)

## 2014-04-22 LAB — TROPONIN I: Troponin I: 15.15 ng/mL (ref <0.30)

## 2014-04-22 LAB — TSH: TSH: 2.69 u[IU]/mL (ref 0.350–4.500)

## 2014-04-22 SURGERY — LEFT HEART CATHETERIZATION WITH CORONARY ANGIOGRAM
Anesthesia: LOCAL

## 2014-04-22 MED ORDER — ADENOSINE 6 MG/2ML IV SOLN
INTRAVENOUS | Status: AC
  Filled 2014-04-22: qty 2

## 2014-04-22 MED ORDER — METOPROLOL TARTRATE 12.5 MG HALF TABLET: 25.0000 mg | ORAL_TABLET | Freq: Two times a day (BID) | ORAL | Status: DC

## 2014-04-22 MED ORDER — OXYCODONE-ACETAMINOPHEN 5-325 MG PO TABS: 1.0000 | ORAL_TABLET | ORAL | Status: DC | PRN

## 2014-04-22 MED ORDER — TICAGRELOR 90 MG PO TABS
90.0000 mg | ORAL_TABLET | Freq: Two times a day (BID) | ORAL | Status: DC
  Administered 2014-04-23 – 2014-04-26 (×7): 90 mg via ORAL
  Filled 2014-04-22 (×11): qty 1

## 2014-04-22 MED ORDER — VERAPAMIL HCL 2.5 MG/ML IV SOLN
INTRAVENOUS | Status: AC
  Filled 2014-04-22: qty 2

## 2014-04-22 MED ORDER — TIROFIBAN HCL IV 5 MG/100ML
INTRAVENOUS | Status: AC
  Filled 2014-04-22: qty 100

## 2014-04-22 MED ORDER — SODIUM CHLORIDE 0.9 % IV SOLN
INTRAVENOUS | Status: AC
  Administered 2014-04-22: 19:00:00 via INTRAVENOUS

## 2014-04-22 MED ORDER — ASPIRIN 81 MG PO CHEW
81.0000 mg | CHEWABLE_TABLET | Freq: Every day | ORAL | Status: DC
Start: 2014-04-23 — End: 2014-04-26
  Administered 2014-04-23 – 2014-04-26 (×4): 81 mg via ORAL
  Filled 2014-04-22 (×5): qty 1

## 2014-04-22 MED ORDER — ATROPINE SULFATE 0.1 MG/ML IJ SOLN
INTRAMUSCULAR | Status: AC
  Filled 2014-04-22: qty 10

## 2014-04-22 MED ORDER — TIROFIBAN HCL IV 12.5 MG/250 ML
0.0750 ug/kg/min | INTRAVENOUS | Status: AC
  Administered 2014-04-22 – 2014-04-23 (×2): 0.075 ug/kg/min via INTRAVENOUS
  Filled 2014-04-22 (×2): qty 250

## 2014-04-22 MED ORDER — ACETAMINOPHEN 325 MG PO TABS
650.0000 mg | ORAL_TABLET | ORAL | Status: DC | PRN
Start: 2014-04-22 — End: 2014-04-26

## 2014-04-22 MED ORDER — ACETAMINOPHEN 325 MG PO TABS: 650.0000 mg | ORAL_TABLET | ORAL | Status: DC | PRN

## 2014-04-22 MED ORDER — TICAGRELOR 90 MG PO TABS
ORAL_TABLET | ORAL | Status: AC
  Filled 2014-04-22: qty 2

## 2014-04-22 MED ORDER — MORPHINE SULFATE 2 MG/ML IJ SOLN: 2.0000 mg | INTRAMUSCULAR | Status: DC | PRN

## 2014-04-22 MED ORDER — ASPIRIN EC 81 MG PO TBEC: 81.0000 mg | DELAYED_RELEASE_TABLET | Freq: Every day | ORAL | Status: DC

## 2014-04-22 MED ORDER — NITROGLYCERIN 1 MG/10 ML FOR IR/CATH LAB
INTRA_ARTERIAL | Status: AC
  Filled 2014-04-22: qty 10

## 2014-04-22 MED ORDER — ONDANSETRON HCL 4 MG/2ML IJ SOLN: 4.0000 mg | Freq: Four times a day (QID) | INTRAMUSCULAR | Status: DC | PRN

## 2014-04-22 MED ORDER — ATORVASTATIN CALCIUM 40 MG PO TABS: 40.0000 mg | ORAL_TABLET | Freq: Every day | ORAL | Status: DC

## 2014-04-22 MED ORDER — HEPARIN (PORCINE) IN NACL 2-0.9 UNIT/ML-% IJ SOLN
INTRAMUSCULAR | Status: AC
  Filled 2014-04-22: qty 1000

## 2014-04-22 MED ORDER — ZOLPIDEM TARTRATE 5 MG PO TABS
5.0000 mg | ORAL_TABLET | Freq: Every evening | ORAL | Status: DC | PRN
  Administered 2014-04-23 – 2014-04-25 (×2): 5 mg via ORAL
  Filled 2014-04-22 (×2): qty 1

## 2014-04-22 MED ORDER — FENTANYL CITRATE 0.05 MG/ML IJ SOLN
INTRAMUSCULAR | Status: AC
  Filled 2014-04-22: qty 2

## 2014-04-22 MED ORDER — ATORVASTATIN CALCIUM 80 MG PO TABS
80.0000 mg | ORAL_TABLET | Freq: Every day | ORAL | Status: DC
  Administered 2014-04-22 – 2014-04-25 (×4): 80 mg via ORAL
  Filled 2014-04-22 (×6): qty 1

## 2014-04-22 MED ORDER — NITROGLYCERIN 0.4 MG SL SUBL: 0.4000 mg | SUBLINGUAL_TABLET | SUBLINGUAL | Status: DC | PRN

## 2014-04-22 MED ORDER — ENOXAPARIN SODIUM 40 MG/0.4ML ~~LOC~~ SOLN
40.0000 mg | SUBCUTANEOUS | Status: DC
  Administered 2014-04-23 – 2014-04-24 (×2): 40 mg via SUBCUTANEOUS
  Filled 2014-04-22 (×3): qty 0.4

## 2014-04-22 MED ORDER — ALPRAZOLAM 0.25 MG PO TABS: 0.2500 mg | ORAL_TABLET | Freq: Two times a day (BID) | ORAL | Status: DC | PRN

## 2014-04-22 MED ORDER — MIDAZOLAM HCL 2 MG/2ML IJ SOLN
INTRAMUSCULAR | Status: AC
  Filled 2014-04-22: qty 2

## 2014-04-22 MED ORDER — BIVALIRUDIN 250 MG IV SOLR
INTRAVENOUS | Status: AC
  Filled 2014-04-22: qty 250

## 2014-04-22 MED ORDER — ONDANSETRON HCL 4 MG/2ML IJ SOLN
4.0000 mg | Freq: Four times a day (QID) | INTRAMUSCULAR | Status: DC | PRN
  Administered 2014-04-22: 4 mg via INTRAVENOUS
  Filled 2014-04-22: qty 2

## 2014-04-22 MED ORDER — CYCLOBENZAPRINE HCL 10 MG PO TABS
5.0000 mg | ORAL_TABLET | Freq: Three times a day (TID) | ORAL | Status: DC | PRN
  Administered 2014-04-23: 5 mg via ORAL
  Filled 2014-04-22 (×3): qty 1

## 2014-04-22 MED ORDER — LIDOCAINE HCL (PF) 1 % IJ SOLN
INTRAMUSCULAR | Status: AC
  Filled 2014-04-22: qty 30

## 2014-04-22 MED ORDER — METOPROLOL TARTRATE 25 MG PO TABS
25.0000 mg | ORAL_TABLET | Freq: Two times a day (BID) | ORAL | Status: DC
  Administered 2014-04-22 – 2014-04-26 (×8): 25 mg via ORAL
  Filled 2014-04-22 (×10): qty 1

## 2014-04-22 MED ORDER — NITROGLYCERIN IN D5W 200-5 MCG/ML-% IV SOLN
INTRAVENOUS | Status: AC
  Filled 2014-04-22: qty 250

## 2014-04-22 NOTE — H&P (Signed)
History and Physical   Patient ID: Billy [name] MRN: [id number], DOB/AGE: January 10, 1935 79 y.o. Date of Encounter: 04/22/2014  Primary Physician: Curlene Labrum, MD Primary Cardiologist: Dr. Domenic Polite  Chief Complaint:  STEMI  HPI: Billy [name] is a 79 y.o. male with a history of CAD. He was seen by Dr. Domenic Polite on 11/02 for episodic chest pain, felt anginal. Stress test was performed on 11/12, results below. It was intermediate risk and a trial of medical therapy was planned.   Today, he went to [facility] ER for chest pain, he was having ongoing pain, and his ECG was abnormal, although it did not reach STEMI criteria. He was transferred urgently to [facility], and was taken directly to the Cath Lab.  His chest pain onset was at 2 PM today. It was more severe than previous episodes, at a 10/10. He had some shortness of breath with it and some diaphoresis, but no nausea or vomiting. It was his usual angina. He took sublingual nitroglycerin 4, but his chest pain was not relieved. He went to [facility]. He was treated with medications including aspirin 324 mg, IV nitroglycerin and IV heparin. His chest pain improved but did not resolve. [facility] transported him to [facility] and he received morphine 8 mg in route. His IV nitroglycerin was uptitrated. Upon arrival to [facility], he was still having some chest discomfort although was greatly improved. He was not diaphoretic and did not shortness of breath although he was on oxygen. His ECG was still abnormal and he was taken urgently to the cath lab.   Past Medical History  Diagnosis Date  . Coronary atherosclerosis of native coronary artery     Multivessel status post CABG  . Essential hypertension, benign   . Carotid artery disease     123456 LICA and [id number] RICA - December 2013  . Sinus bradycardia     Asymptomatic  . Gout   . HOH (hard of hearing)     Surgical History:  Past Surgical History  Procedure Laterality Date  .  Coronary artery bypass graft  11/09/1999    LIMA to LAD, SVG to diagonal, SVG to OM1 and OM2, SVG to PDA  . Appendectomy      Ruptured  . Cataract extraction w/phaco Right 11/13/2012    Procedure: CATARACT EXTRACTION PHACO AND INTRAOCULAR LENS PLACEMENT (IOC);  Surgeon: Tonny Branch, MD;  Location: [facility];  Service: Ophthalmology;  Laterality: Right;  CDE:12.75  . Cataract extraction w/phaco Left 01/11/2013    Procedure: CATARACT EXTRACTION PHACO AND INTRAOCULAR LENS PLACEMENT (IOC);  Surgeon: Tonny Branch, MD;  Location: [facility];  Service: Ophthalmology;  Laterality: Left;  CDE: 21.13     I have reviewed the patient's current medications. Medication Sig  aspirin EC 81 MG tablet Take 81 mg by mouth daily.    COLCRYS 0.6 MG tablet Take 1 tablet by mouth Once daily as needed.  cyclobenzaprine (FLEXERIL) 5 MG tablet Take 5 mg by mouth at bedtime.  ibuprofen (ADVIL,MOTRIN) 200 MG tablet Take 200 mg by mouth every 6 (six) hours as needed.  isosorbide mononitrate (IMDUR) 60 MG 24 hr tablet Take 1 tablet (60 mg total) by mouth daily.  lisinopril (PRINIVIL,ZESTRIL) 20 MG tablet Take 1 tablet (20 mg total) by mouth daily.  nitroGLYCERIN (NITROSTAT) 0.4 MG SL tablet Place 1 tablet (0.4 mg total) under the tongue every 5 (five) minutes as needed.  Saw Palmetto, Serenoa repens, 450 MG CAPS Take by mouth. Take  1 capsule in AM and 2 capsules in PM  simvastatin (ZOCOR) 40 MG tablet Take 0.5 tablets (20 mg total) by mouth daily.    Continuous Infusions: heparin and IV nitroglycerin  Allergies: No Known Allergies  History   Social History  . Marital Status: Married    Spouse Name: N/A    Number of Children: N/A  . Years of Education: N/A   Occupational History  . DISABLED     [employer]   Social History Main Topics  . Smoking status: Former Smoker -- 0.50 packs/day for 50 years    Types: Cigarettes    Quit date: 06/08/1999  . Smokeless tobacco: Never Used  . Alcohol Use: 0.0 oz/week    0 Not  specified per week  . Drug Use: No  . Sexual Activity: Yes    Birth Control/ Protection: None   Other Topics Concern  . Not on file   Social History Narrative   He lives in [place] with his wife.    Family Status  Relation Status Death Age  . Mother Deceased   . Father Deceased   . Sister Deceased     had an MI at a young age    Review of Systems:   Full 14-point review of systems otherwise negative except as noted above.  Physical Exam: General: Well developed, well nourished,male in mild-moderate distress. Head: Normocephalic, atraumatic, sclera non-icteric, no xanthomas, nares are without discharge. Dentition: poor Neck: No carotid bruits. JVD not elevated. No thyromegally Lungs: Good expansion bilaterally. without wheezes or rhonchi.  Heart: Regular rate and rhythm with S1 S2.  No S3 or S4.  No murmur, no rubs, or gallops appreciated. Abdomen: Soft, non-tender, non-distended with normoactive bowel sounds. No hepatomegaly. No rebound/guarding. No obvious abdominal masses. Msk:  Strength and tone appear normal for age. No joint deformities or effusions, no spine or costo-vertebral angle tenderness. Extremities: No clubbing or cyanosis. No edema.  Distal pedal pulses are 2+ in upper extrem; decreased DP pulses bilaterally Neuro: Alert and oriented X 3. Moves all extremities spontaneously. No focal deficits noted. Psych:  Responds to questions appropriately with a normal affect. Skin: No rashes or lesions noted  Labs:  WBC 10.6 04/22/2014  hemoglobin 12.9   hematocrit 39.5   platelet 317       sodium 137   potassium 4.2   chloride 103   CO2 24   BUN 26   creatinine 1.61   glucose 154       magnesium 1.9       troponin 0.01       albumin 4.5   Alkaline phosphatase 49   AST 26   ALT 34   Total bilirubin 0.6   Total protein 7.7              Radiology/Studies: 04/22/2014 Performed at [place]  [facility] 04/18/2014 IMPRESSION: 1. Abnormal study. Region of inferoseptal and inferolateral scar with moderate peri-infarct ischemia noted, study quality is affected by diaphragmatic attenuation. 2. Normal left ventricular wall motion. 3. Left ventricular ejection fraction 60% 4. Intermediate-risk stress test findings*.  ECG: 04/22/2014 Sinus rhythm, anterolateral ST depression, worse in the lateral leads.  ASSESSMENT AND PLAN:  Principal Problem:   Non-ST elevation myocardial infarction (NSTEMI), initial episode of care - urgent cardiac catheterization with further evaluation and treatment depending on the results. With his elevated creatinine, we'll not do an LV gram, will do an echo.  Active Problems:  Mixed hyperlipidemia - check a profile and LFTs in a.m.    Essential hypertension, benign - continue home medications but will hold ACE inhibitor.    HOH (hard of hearing) - Follow    Chronic kidney disease stage III - creatinine in June 2014 was 1.22 with a GFR 56. Creatinine today at [facility] was 1.61, will recheck in a.m. After hydration.  Jonetta Speak, PA-C 04/22/2014 5:42 PM Beeper [phone]   I have personally seen and examined this patient with Rosaria Ferries, PA-C. I agree with the assessment and plan as outlined above. Pt presenting with acute MI. Emergent cardiac cath with PCI.   MCALHANY,CHRISTOPHER 7:29 PM 04/22/2014

## 2014-04-22 NOTE — Progress Notes (Signed)
Sheath removed at 2208 and pressure held for 20 minutes. Patient tolerated well. Minimal bruising and no further hematoma growth. Guaze and pressure dressing applied. Patient instructed on post sheath removal care and activity with verbal teach back.

## 2014-04-22 NOTE — CV Procedure (Addendum)
Cardiac Catheterization Operative Report  AKSHAT LITER PU:[id number] 11/16/20156:44 PM Curlene Labrum, MD  Procedure Performed:  1. Left Heart Catheterization 2. Selective Coronary Angiography 3. SVG angiography 4. LIMA graft angiography 5. PTCA/DES x 2 SVG to OM  Operator: Lauree Chandler, MD  Indication: 79 yo male with history of HTN, CAD s/p 5V CABG in 2001 with recent chest pain concerning for angina. Stress myoview 04/18/14 with ischemia in the inferolateral wall. Cardiac cath was being discussed. He presented to the [facility] ED today with 10/10 chest pain/shoulder pain. Transported to [facility] as code STEMI for emergent cardiac cath. His EKG showed diffuse ST depression pre-cordial leads (possible posterior STEMI)                                    Procedure Details: Emergency consent was obtained. He was brought to the cath lab via EMS. The patient was further sedated with Versed and Fentanyl. The right groin was prepped and draped in the usual manner. Using the modified Seldinger access technique, a 6 French sheath was placed in the right femoral artery. Selective coronary angiography was performed. A JL4 was used to engage the left main. A JR4 was used to engage the RCA, SVG to the RCA, SVG to the OM. A LCB catheter was used to engage the SVG to the Diagonal. A IMA catheter was used to engage the LIMA graft to the LAD. He was found to have occlusion of the SVG to the OM system. I elected to proceed to PCI of the SVG to the OM.   PCI Note: He was given a weight based bolus of Angiomax and a drip was started. He was given Brilinta 180 mg po x 1. I then engaged the SVG to the OM with a LCB guiding catheter. I then passed a Cougar IC wire down the SVG to the OM. A 2.5 x 12 mm balloon was used to pre-dilate the mid body of the vein graft. Flow was restored but this was not brisk flow. A large thrombus burden was noted in the vein graft. He was given a bolus of Aggrastat and a drip  was started. I attempted aspiration with a thrombectomy catheter but it would not pass down the vein graft. I then placed a 2.75 x 20 mm Promus Premier DES in the mid body of the vein graft. I placed an overlapping 2.75 x 24 mm Promus Premier DES in the mid body of the vein graft, overlapping with the first stent. There was TIMI-1 flow down the graft at this time and distal targets could be visualized. I then gave IC Verapamil and IC adenosine attempting to improve the flow down the vein graft. The distal vascular bed was likely loaded with distal embolization. I did not think that there was anything more to do mechanically with the vessel. The PCI was stopped with TIMI-1 flow down the previously completely occluded vein graft.   A pigtail catheter was used to perform a left ventricular angiogram. There were no immediate complications. The patient was taken to the recovery area in stable condition.   Hemodynamic Findings: Central aortic pressure: 157/79 Left ventricular pressure: 147/9/25  Angiographic Findings:  Left main: 30% stenosis.   Left Anterior Descending Artery: Large caliber vessel that courses to the apex. 90% proximal stenosis. Diagonal occluded. Mild disease mid and distal vessel. Competitive filling seen from IMA graft.   Circumflex  Artery: Moderate caliber vessel. 100% mid occlusion. OM 1 and 2 fill from the bypass graft (seen after the graft was opened)  Right Coronary Artery: Moderate caliber dominant vessel with 100% mid occlusion. Distal vessel fills from the patent vein graft.   Graft Anatomy:  SVG to Diagonal is patent SVG to PDA is patent with severe 70-80% stenosis in the mid and distal body of the vein graft.  SVG to OM1 is occluded in the mid body of the vein graft, thrombotic appearing occlusion.  LIMA to mid LAD is patent  Left Ventricular Angiogram: Deferred.   Impression: 1. Acute MI secondary to occluded SVG to OM1 2. Patent SVG to RCA with severe disease in  the body of the vein graft as above 3. Patent LIMA to LAD and patent SVG to Diagonal 4. Successful PTCA/DES x 2 SVG to OM, flow restored but not optimal due to distal embolization and large thrombus burden  Recommendations: He will be admitted to the CCU. Echo in am. Will continue Aggrastat for 18 hours. Continue ASA and Brilinta. Will start statin and beta blocker. Will hydrate overnight. Plan PCI of the SVG to RCA and relook of SVG to OM in 2-3 days if renal function stable.        Complications:  None. The patient tolerated the procedure well.

## 2014-04-22 NOTE — Progress Notes (Signed)
ANTICOAGULATION CONSULT NOTE - Initial Consult  Pharmacy Consult for Aggrastat Indication: 18 hours post PCI  No Known Allergies  Patient Measurements: Height: 5\' 8"  (172.7 cm) Weight: 138 lb 7.2 oz (62.8 kg) IBW/kg (Calculated) : 68.4  Vital Signs: Temp: 98 F (36.7 C) (11/16 1926) Temp Source: Oral (11/16 1926) BP: 174/81 mmHg (11/16 1926) Pulse Rate: 82 (11/16 1926)  Labs: No results for input(s): HGB, HCT, PLT, APTT, LABPROT, INR, HEPARINUNFRC, CREATININE, CKTOTAL, CKMB, TROPONINI in the last 72 hours.  Estimated Creatinine Clearance: 45 mL/min (by C-G formula based on Cr of 1.22).   Medical History: Past Medical History  Diagnosis Date  . Coronary atherosclerosis of native coronary artery     Multivessel status post CABG  . Essential hypertension, benign   . Carotid artery disease     123456 LICA and [id number] RICA - December 2013  . Sinus bradycardia     Asymptomatic  . Gout   . HOH (hard of hearing)     Assessment: 22 YOM s/p cath and PCI to receive Aggrastat x18 hours. Aggrastat bolused and initiated at 0.075 mcg/kg/min in the cath lab at 18:03 PM. SCr from [place] is 1.61 with estimated CrCl ~ 30-35 mL/min.  Rate is appropriate for this CrCl.   Goal of Therapy:  Monitor platelets by anticoagulation protocol: Yes   Plan:  Continue Aggrastat at 0.047mcg/kgmin for total of 18 hours.  Follow-up platelets.   Sloan Leiter, PharmD, BCPS Clinical Pharmacist [phone] 04/22/2014,7:44 PM

## 2014-04-23 DIAGNOSIS — N183 Chronic kidney disease, stage 3 unspecified: Secondary | ICD-10-CM | POA: Diagnosis present

## 2014-04-23 DIAGNOSIS — I059 Rheumatic mitral valve disease, unspecified: Secondary | ICD-10-CM

## 2014-04-23 DIAGNOSIS — I214 Non-ST elevation (NSTEMI) myocardial infarction: Secondary | ICD-10-CM

## 2014-04-23 DIAGNOSIS — N182 Chronic kidney disease, stage 2 (mild): Secondary | ICD-10-CM

## 2014-04-23 DIAGNOSIS — D649 Anemia, unspecified: Secondary | ICD-10-CM | POA: Diagnosis present

## 2014-04-23 DIAGNOSIS — I10 Essential (primary) hypertension: Secondary | ICD-10-CM

## 2014-04-23 DIAGNOSIS — R7303 Prediabetes: Secondary | ICD-10-CM | POA: Diagnosis present

## 2014-04-23 LAB — HEMOGLOBIN A1C
HEMOGLOBIN A1C: 5.9 % — AB (ref <5.7)
MEAN PLASMA GLUCOSE: 123 mg/dL — AB (ref <117)

## 2014-04-23 LAB — CBC
HEMATOCRIT: 36.2 % — AB (ref 39.0–52.0)
Hemoglobin: 12.2 g/dL — ABNORMAL LOW (ref 13.0–17.0)
MCH: 29.5 pg (ref 26.0–34.0)
MCHC: 33.7 g/dL (ref 30.0–36.0)
MCV: 87.7 fL (ref 78.0–100.0)
Platelets: 295 10*3/uL (ref 150–400)
RBC: 4.13 MIL/uL — ABNORMAL LOW (ref 4.22–5.81)
RDW: 13.4 % (ref 11.5–15.5)
WBC: 9.6 10*3/uL (ref 4.0–10.5)

## 2014-04-23 LAB — BASIC METABOLIC PANEL
Anion gap: 17 — ABNORMAL HIGH (ref 5–15)
BUN: 27 mg/dL — AB (ref 6–23)
CO2: 21 meq/L (ref 19–32)
CREATININE: 1.1 mg/dL (ref 0.50–1.35)
Calcium: 9.2 mg/dL (ref 8.4–10.5)
Chloride: 103 mEq/L (ref 96–112)
GFR calc Af Amer: 73 mL/min — ABNORMAL LOW (ref >90)
GFR, EST NON AFRICAN AMERICAN: 63 mL/min — AB (ref >90)
GLUCOSE: 114 mg/dL — AB (ref 70–99)
Potassium: 4 mEq/L (ref 3.7–5.3)
Sodium: 141 mEq/L (ref 137–147)

## 2014-04-23 LAB — LIPID PANEL
CHOLESTEROL: 134 mg/dL (ref 0–200)
HDL: 33 mg/dL — ABNORMAL LOW (ref >39)
LDL Cholesterol: 81 mg/dL (ref 0–99)
TRIGLYCERIDES: 99 mg/dL (ref <150)
Total CHOL/HDL Ratio: 4.1 RATIO
VLDL: 20 mg/dL (ref 0–40)

## 2014-04-23 LAB — GLUCOSE, CAPILLARY: Glucose-Capillary: 104 mg/dL — ABNORMAL HIGH (ref 70–99)

## 2014-04-23 LAB — TROPONIN I
Troponin I: >20 ng/mL (ref <0.30)
Troponin I: >20 ng/mL (ref <0.30)

## 2014-04-23 MED ORDER — ALUM & MAG HYDROXIDE-SIMETH 200-200-20 MG/5ML PO SUSP
15.0000 mL | ORAL | Status: DC | PRN
  Administered 2014-04-23 (×2): 15 mL via ORAL
  Filled 2014-04-23 (×2): qty 30

## 2014-04-23 MED ORDER — FENTANYL CITRATE 0.05 MG/ML IJ SOLN: 200.0000 ug | Freq: Once | INTRAMUSCULAR | Status: DC

## 2014-04-23 MED ORDER — ETOMIDATE 2 MG/ML IV SOLN: 20.0000 mg | Freq: Once | INTRAVENOUS | Status: DC

## 2014-04-23 MED ORDER — SODIUM CHLORIDE 0.9 % IV SOLN: INTRAVENOUS | Status: DC

## 2014-04-23 MED ORDER — MIDAZOLAM HCL 2 MG/2ML IJ SOLN
4.0000 mg | Freq: Once | INTRAMUSCULAR | Status: DC
Start: 2014-04-23 — End: 2014-04-23

## 2014-04-23 MED ORDER — ALUM & MAG HYDROXIDE-SIMETH 200-200-20 MG/5ML PO SUSP: 15.0000 mL | Freq: Four times a day (QID) | ORAL | Status: DC | PRN

## 2014-04-23 MED ORDER — SODIUM CHLORIDE 0.9 % IV SOLN
INTRAVENOUS | Status: DC
  Administered 2014-04-23: 13:00:00 via INTRAVENOUS

## 2014-04-23 MED FILL — Heparin Sodium (Porcine) 100 Unt/ML in Sodium Chloride 0.45%: INTRAMUSCULAR | Qty: 250 | Status: AC

## 2014-04-23 MED FILL — Morphine Sulfate Inj 10 MG/ML: INTRAMUSCULAR | Qty: 1 | Status: AC

## 2014-04-23 MED FILL — Sodium Chloride IV Soln 0.9%: INTRAVENOUS | Qty: 50 | Status: AC

## 2014-04-23 MED FILL — Nitroglycerin IV Soln 200 MCG/ML in D5W: INTRAVENOUS | Qty: 250 | Status: AC

## 2014-04-23 NOTE — Progress Notes (Signed)
SUBJECTIVE:  Pt seen and examined in AM. No acute events overnight. Telemetry with NSR. Pt's sheath was removed yesterday with no complication. He currently denies chest pain, dyspnea, or LE edema. He is s/p left heart catherization yesterday that required two DES of SVG to OM. He is to have 2D-echo today and PCI of the SVG to RCA and relook of SVG to OM possibly on Thursday.   OBJECTIVE:   Vitals:   Filed Vitals:   04/23/14 1000 04/23/14 1100 04/23/14 1110 04/23/14 1200  BP: 108/63 106/40 106/40   Pulse: 68 63 65 56  Temp:   97.7 F (36.5 C)   TempSrc:   Oral   Resp: 13 21 18 19   Height:      Weight:      SpO2: 99% 98% 98% 98%   I&O's:   Intake/Output Summary (Last 24 hours) at 04/23/14 1211 Last data filed at 04/23/14 1000  Gross per 24 hour  Intake 788.73 ml  Output    950 ml  Net -161.27 ml   TELEMETRY: Reviewed telemetry pt in NSR:     PHYSICAL EXAM General: Well developed, well nourished, in no acute distress Head:   Normal cephalic and atramatic  Lungs:  Clear bilaterally to auscultation. Heart:   HRRR S1 S2  No JVD.   Abdomen: Abdomen soft and non-tender Msk:  Back normal,  Normal strength and tone for age. Extremities:  No edema.   Neuro: Alert and oriented. Psych:  Normal affect, responds appropriately Skin: No rash   LABS: Basic Metabolic Panel:  Recent Labs  04/22/14 2147 04/23/14 0724  NA 141 141  K 4.2 4.0  CL 105 103  CO2 19 21  GLUCOSE 130* 114*  BUN 24* 27*  CREATININE 1.23 1.10  CALCIUM 9.1 9.2   Liver Function Tests: No results for input(s): AST, ALT, ALKPHOS, BILITOT, PROT, ALBUMIN in the last 72 hours. No results for input(s): LIPASE, AMYLASE in the last 72 hours. CBC:  Recent Labs  04/22/14 2147 04/23/14 0724  WBC 8.0 9.6  HGB 11.4* 12.2*  HCT 33.9* 36.2*  MCV 85.4 87.7  PLT 275 295   Cardiac Enzymes:  Recent Labs  04/22/14 2002 04/23/14 0057 04/23/14 0724  TROPONINI 15.15* >20.00* >20.00*   BNP: Invalid  input(s): POCBNP D-Dimer: No results for input(s): DDIMER in the last 72 hours. Hemoglobin A1C:  Recent Labs  04/22/14 2002  HGBA1C 5.9*   Fasting Lipid Panel:  Recent Labs  04/23/14 0057  CHOL 134  HDL 33*  LDLCALC 81  TRIG 99  CHOLHDL 4.1   Thyroid Function Tests:  Recent Labs  04/22/14 2002  TSH 2.690   Anemia Panel: No results for input(s): VITAMINB12, FOLATE, FERRITIN, TIBC, IRON, RETICCTPCT in the last 72 hours. Coag Panel:   No results found for: INR, PROTIME  RADIOLOGY: Nm Myocar Multi W/spect W/wall Motion / Ef  04/18/2014   CLINICAL DATA:  79 year old male with history of hypertension, hyperlipidemia, and multivessel CAD status post CABG. He has had increasing angina symptoms, and this study is requested to evaluate for the presence of ischemia.  EXAM: MYOCARDIAL IMAGING WITH SPECT (REST AND PHARMACOLOGIC-STRESS)  GATED LEFT VENTRICULAR WALL MOTION STUDY  LEFT VENTRICULAR EJECTION FRACTION  TECHNIQUE: Standard myocardial SPECT imaging was performed after resting intravenous injection of 10 mCi Tc-32m sestamibi. Subsequently, intravenous infusion of Lexiscan was performed under the supervision of the Cardiology staff. At peak effect of the drug, 30 mCi Tc-59m sestamibi was injected intravenously  and standard myocardial SPECT imaging was performed. Quantitative gated imaging was also performed to evaluate left ventricular wall motion, and estimate left ventricular ejection fraction.  FINDINGS: Baseline tracing shows normal sinus rhythm at 62 beats per min with poor R wave progression and nonspecific ST changes. Lexiscan bolus was given in standard fashion. Heart rate increased from 62 beats per min up to 94 beats per min, and blood pressure increased from 170/79 up to 184/75. There was nondiagnostic accentuation in resting ST segment abnormalities. No arrhythmias were noted.  Analysis of the overall perfusion data finds adequate myocardial radiotracer uptake.  Perfusion:  There is a moderate-size inferoseptal and inferolateral defect from apex to base that is partially reversible, noted in the setting of diaphragmatic attenuation. This is suggestive of scar with paired moderate peri-infarct ischemia.  Wall Motion: Normal left ventricular wall motion. No left ventricular dilation.  Left Ventricular Ejection Fraction: 60 %  End diastolic volume 73 ml  End systolic volume 29 ml  IMPRESSION: 1. Abnormal study. Region of inferoseptal and inferolateral scar with moderate peri-infarct ischemia noted, study quality is affected by diaphragmatic attenuation.  2. Normal left ventricular wall motion.  3. Left ventricular ejection fraction 60%  4. Intermediate-risk stress test findings*.  *2012 Appropriate Use Criteria for Coronary Revascularization Focused Update: J Am Coll Cardiol. N6492421. http://content.[email or web address].aspx?articleid=1201161   Electronically Signed   By: Rozann Lesches M.D.   On: 04/18/2014 16:17    Principal Problem:   ST elevation myocardial infarction (STEMI) of inferolateral wall Active Problems:   Mixed hyperlipidemia   Essential hypertension, benign   HOH (hard of hearing)   Prediabetes   CKD (chronic kidney disease) stage 2, GFR 60-89 ml/min   Anemia    ASSESSMENT: 79 yo M with pmh of CAD s/p CABG (2001), HTN, HL, and CKD Stage 3 who presented with chest pain found to have acute inferolateral STEMI s/p catherization with DES x 2 SVG to OM.  PLAN:   Acute Inferolateral Wall STEMI s/p PCI in setting of ASCAD s/p CABG (2001) - Currently CP-free. Will check troponin in AM to ensure downtrend (last one >20). He is s/p DES x 2 SVG to OM. He is to have PCI of the SVG to RCA and relook of SVG to OM due to non-optimal flow this Thursday if renal function continues to be stable. Echo results pending. Currently on dual AP therapy with aspirin 81 mg daily and brilinta 90 mg BID which he is to continue for at least 1 year.  Also on metoprolol  tartrate 25 mg BID and atorvastatin 80 mg daily. Hold home lisinopril  20 mg daily in setting of AKI. Pt to have phase 1 cardiac rehab.  Hypertension - Currently normotensive on metoprolol tartrate 25 mg BID. Pt at home on lisinopril 20 mg daily and imdur 60 mg daily. Hold home lisinopril  20 mg daily in setting of AKI. Hyperlipidemia - LDL 81 not at goal <70. Continue atorvastatin 80 mg daily. Pt at home on simvastatin 40 mg daily.  Nonoliguric AKI on CKD Stage 2 - Cr improved to 1.1 from 1.23 after IV fluids with unclear baseline. Pt on NS 50 mL/hr for 12 hrs. Awaiting 2D-echo to assess LV function, adjust IV fluids as necessary.  Hold home lisinopril  20 mg daily. Acute on chronic normocytic anemia - Hg stable at 12.2 near baseline 13 (1 yr ago) with no active bleeding. Continue to monitor.  Pre-diabetes - A1c 5.9. Continue lifestyle modification. Consider metformin as outpatient.  Will transfer to SDU today and schedule for PCI for this Thursday.   Juluis Mire, MD  PGY-II IMTS PagerPW:[phone] 04/23/2014  12:11 PM  Attending note to follow

## 2014-04-23 NOTE — Progress Notes (Signed)
Ambulated 248ft around the hall. No c/o SOB. Tolerated well. Vitals remained stable.

## 2014-04-23 NOTE — Care Management Note (Addendum)
    Page 1 of 1   04/26/2014     11:49:59 AM CARE MANAGEMENT NOTE 04/26/2014  Patient:  Billy [name], Billy [name]   Account Number:  [id number]  Date Initiated:  04/23/2014  Documentation initiated by:  Elissa Hefty  Subjective/Objective Assessment:   adm w nstemi     Action/Plan:   lives w wife, pcp dr Remo Lipps burdine   Anticipated DC Date:  04/24/2014   Anticipated DC Plan:  [place]  CM consult  Medication Assistance      Choice offered to / List presented to:             Status of service:  Completed, signed off Medicare Important Message given?   (If response is "NO", the following Medicare IM given date fields will be blank) Date Medicare IM given:   Medicare IM given by:   Date Additional Medicare IM given:   Additional Medicare IM given by:    Discharge Disposition:  HOME/SELF CARE  Per UR Regulation:  Reviewed for med. necessity/level of care/duration of stay  If discussed at [place] of Stay Meetings, dates discussed:    Comments:  Martin Belling RN, BSN, MSHL, CCM  Nurse - Case Manager,  (Unit [phone]  04/26/2014 CM notified/reminded unit of prio auth needs. Dispo Plan:  Home / Self care.  11/17 0917 debbie dowell rn,bsn gave pt 30day free brilinta card. has 6.60 copay but needs prior auth at [id number]

## 2014-04-23 NOTE — Progress Notes (Signed)
CARDIAC REHAB PHASE I   PRE:  Rate/Rhythm: 42 SR  BP:  Supine:   Sitting: 119/84  Standing:    SaO2: 100%RA  MODE:  Ambulation: 350 ft   POST:  Rate/Rhythm: 77SR  BP:  Supine:   Sitting: 102/64  Standing:    SaO2: 100%RA 1340-1425 Pt walked 350 ft on RA with gait belt use and asst x 1 with steady gait. No CP. Tolerated well. Began MI ed. Reviewed restrictions and stent/brilinta use. Stressed importance of brilinta. Has booklet. Left heart healthy diet for wife to read. Pt in recliner with call bell.   Graylon Good, RN BSN  04/23/2014 2:21 PM

## 2014-04-23 NOTE — Plan of Care (Signed)
Problem: Consults Goal: Tobacco Cessation referral if indicated Outcome: Not Applicable Date Met:  94/58/59

## 2014-04-23 NOTE — Progress Notes (Signed)
  Echocardiogram 2D Echocardiogram has been performed.  Darlina Sicilian M 04/23/2014, 11:07 AM

## 2014-04-24 ENCOUNTER — Telehealth: Payer: Self-pay | Admitting: *Deleted

## 2014-04-24 DIAGNOSIS — I25810 Atherosclerosis of coronary artery bypass graft(s) without angina pectoris: Secondary | ICD-10-CM | POA: Insufficient documentation

## 2014-04-24 DIAGNOSIS — I2119 ST elevation (STEMI) myocardial infarction involving other coronary artery of inferior wall: Principal | ICD-10-CM

## 2014-04-24 DIAGNOSIS — N183 Chronic kidney disease, stage 3 (moderate): Secondary | ICD-10-CM

## 2014-04-24 DIAGNOSIS — R740 Nonspecific elevation of levels of transaminase and lactic acid dehydrogenase [LDH]: Secondary | ICD-10-CM

## 2014-04-24 DIAGNOSIS — R7401 Elevation of levels of liver transaminase levels: Secondary | ICD-10-CM | POA: Diagnosis present

## 2014-04-24 LAB — CBC
HCT: 36.8 % — ABNORMAL LOW (ref 39.0–52.0)
Hemoglobin: 12 g/dL — ABNORMAL LOW (ref 13.0–17.0)
MCH: 29.6 pg (ref 26.0–34.0)
MCHC: 32.6 g/dL (ref 30.0–36.0)
MCV: 90.6 fL (ref 78.0–100.0)
PLATELETS: 204 10*3/uL (ref 150–400)
RBC: 4.06 MIL/uL — ABNORMAL LOW (ref 4.22–5.81)
RDW: 13.5 % (ref 11.5–15.5)
WBC: 7 10*3/uL (ref 4.0–10.5)

## 2014-04-24 LAB — COMPREHENSIVE METABOLIC PANEL
ALT: 73 U/L — ABNORMAL HIGH (ref 0–53)
ANION GAP: 13 (ref 5–15)
AST: 220 U/L — ABNORMAL HIGH (ref 0–37)
Albumin: 3.5 g/dL (ref 3.5–5.2)
Alkaline Phosphatase: 46 U/L (ref 39–117)
BUN: 28 mg/dL — AB (ref 6–23)
CO2: 22 meq/L (ref 19–32)
CREATININE: 1.22 mg/dL (ref 0.50–1.35)
Calcium: 9.3 mg/dL (ref 8.4–10.5)
Chloride: 109 mEq/L (ref 96–112)
GFR, EST AFRICAN AMERICAN: 64 mL/min — AB (ref >90)
GFR, EST NON AFRICAN AMERICAN: 55 mL/min — AB (ref >90)
Glucose, Bld: 109 mg/dL — ABNORMAL HIGH (ref 70–99)
Potassium: 4.8 mEq/L (ref 3.7–5.3)
Sodium: 144 mEq/L (ref 137–147)
Total Bilirubin: 0.5 mg/dL (ref 0.3–1.2)
Total Protein: 6.6 g/dL (ref 6.0–8.3)

## 2014-04-24 LAB — TROPONIN I: Troponin I: >20 ng/mL (ref <0.30)

## 2014-04-24 MED ORDER — SODIUM CHLORIDE 0.9 % IJ SOLN: 3.0000 mL | INTRAMUSCULAR | Status: DC | PRN

## 2014-04-24 MED ORDER — SODIUM CHLORIDE 0.9 % IV SOLN: 250.0000 mL | INTRAVENOUS | Status: DC | PRN

## 2014-04-24 MED ORDER — SODIUM CHLORIDE 0.9 % IJ SOLN: 3.0000 mL | Freq: Two times a day (BID) | INTRAMUSCULAR | Status: DC

## 2014-04-24 MED ORDER — SODIUM CHLORIDE 0.9 % IV SOLN
1.0000 mL/kg/h | INTRAVENOUS | Status: DC
  Administered 2014-04-24: 20:00:00 1 mL/kg/h via INTRAVENOUS

## 2014-04-24 NOTE — Progress Notes (Signed)
CARDIAC REHAB PHASE I   PRE:  Rate/Rhythm: 68 SR  BP:  Supine:   Sitting: 141/68  Standing:    SaO2: would not register  MODE:  Ambulation: 700 ft   POST:  Rate/Rhythm: 74 SR  BP:  Supine:   Sitting: 158/72  Standing:    SaO2:  1110-1130 Pt tolerated ambulation well with hand held assist. Gait steady. Pt walked 700 feet without c/o of cp or SOB. Pt back to recliner after walk with call light in reach.  Rodney Langton RN 04/24/2014 11:27 AM

## 2014-04-24 NOTE — Telephone Encounter (Signed)
Wife called and wanted to let Dr. Domenic Polite aware that patient is inpatient at [facility] to get stents placed for blockages.

## 2014-04-24 NOTE — Progress Notes (Signed)
SUBJECTIVE:  Pt seen and examined in AM. No acute events overnight. Telemetry with NSR.  He currently denies chest pain, dyspnea, or LE edema. His 2D-echo yesterday showed EF 55-60% with akinesis of the basal-midinferolateral wall. He is scheduled for PCI of the SVG to RCA and relook of SVG to OM tomorrow morning.    OBJECTIVE:   Vitals:   Filed Vitals:   04/24/14 0342 04/24/14 0500 04/24/14 0730 04/24/14 1111  BP: 139/80  146/71 141/68  Pulse: 70  65   Temp: 97.9 F (36.6 C)  97.4 F (36.3 C) 97.1 F (36.2 C)  TempSrc: Oral  Oral Oral  Resp: 16     Height:      Weight:  138 lb 14.2 oz (63 kg)    SpO2: 98%  96%    I&O's:    Intake/Output Summary (Last 24 hours) at 04/24/14 1116 Last data filed at 04/24/14 0800  Gross per 24 hour  Intake 1105.4 ml  Output   2000 ml  Net -894.6 ml   TELEMETRY: Reviewed telemetry pt in NSR:     PHYSICAL EXAM General: Well developed, well nourished, in no acute distress Head:   Normal cephalic and atramatic  Lungs:  Clear bilaterally to auscultation. Heart:   HRRR S1 S2  No JVD.   Abdomen: Abdomen soft and non-tender Msk:  Back normal,  Normal strength and tone for age. Extremities:  No edema.   Neuro: Alert and oriented. Psych:  Normal affect, responds appropriately Skin: No rash   LABS: Basic Metabolic Panel:  Recent Labs  04/23/14 0724 04/24/14 0339  NA 141 144  K 4.0 4.8  CL 103 109  CO2 21 22  GLUCOSE 114* 109*  BUN 27* 28*  CREATININE 1.10 1.22  CALCIUM 9.2 9.3   Liver Function Tests:  Recent Labs  04/24/14 0339  AST 220*  ALT 73*  ALKPHOS 46  BILITOT 0.5  PROT 6.6  ALBUMIN 3.5   No results for input(s): LIPASE, AMYLASE in the last 72 hours. CBC:  Recent Labs  04/23/14 0724 04/24/14 0339  WBC 9.6 7.0  HGB 12.2* 12.0*  HCT 36.2* 36.8*  MCV 87.7 90.6  PLT 295 204   Cardiac Enzymes:  Recent Labs  04/23/14 0057 04/23/14 0724 04/24/14 0339  TROPONINI >20.00* >20.00* >20.00*    BNP: Invalid input(s): POCBNP D-Dimer: No results for input(s): DDIMER in the last 72 hours. Hemoglobin A1C:  Recent Labs  04/22/14 2002  HGBA1C 5.9*   Fasting Lipid Panel:  Recent Labs  04/23/14 0057  CHOL 134  HDL 33*  LDLCALC 81  TRIG 99  CHOLHDL 4.1   Thyroid Function Tests:  Recent Labs  04/22/14 2002  TSH 2.690   Anemia Panel: No results for input(s): VITAMINB12, FOLATE, FERRITIN, TIBC, IRON, RETICCTPCT in the last 72 hours. Coag Panel:   No results found for: INR, PROTIME  RADIOLOGY: Nm Myocar Multi W/spect W/wall Motion / Ef  04/18/2014   CLINICAL DATA:  79 year old male with history of hypertension, hyperlipidemia, and multivessel CAD status post CABG. He has had increasing angina symptoms, and this study is requested to evaluate for the presence of ischemia.  EXAM: MYOCARDIAL IMAGING WITH SPECT (REST AND PHARMACOLOGIC-STRESS)  GATED LEFT VENTRICULAR WALL MOTION STUDY  LEFT VENTRICULAR EJECTION FRACTION  TECHNIQUE: Standard myocardial SPECT imaging was performed after resting intravenous injection of 10 mCi Tc-42m sestamibi. Subsequently, intravenous infusion of Lexiscan was performed under the supervision of the Cardiology staff. At peak effect of  the drug, 30 mCi Tc-81m sestamibi was injected intravenously and standard myocardial SPECT imaging was performed. Quantitative gated imaging was also performed to evaluate left ventricular wall motion, and estimate left ventricular ejection fraction.  FINDINGS: Baseline tracing shows normal sinus rhythm at 62 beats per min with poor R wave progression and nonspecific ST changes. Lexiscan bolus was given in standard fashion. Heart rate increased from 62 beats per min up to 94 beats per min, and blood pressure increased from 170/79 up to 184/75. There was nondiagnostic accentuation in resting ST segment abnormalities. No arrhythmias were noted.  Analysis of the overall perfusion data finds adequate myocardial radiotracer  uptake.  Perfusion: There is a moderate-size inferoseptal and inferolateral defect from apex to base that is partially reversible, noted in the setting of diaphragmatic attenuation. This is suggestive of scar with paired moderate peri-infarct ischemia.  Wall Motion: Normal left ventricular wall motion. No left ventricular dilation.  Left Ventricular Ejection Fraction: 60 %  End diastolic volume 73 ml  End systolic volume 29 ml  IMPRESSION: 1. Abnormal study. Region of inferoseptal and inferolateral scar with moderate peri-infarct ischemia noted, study quality is affected by diaphragmatic attenuation.  2. Normal left ventricular wall motion.  3. Left ventricular ejection fraction 60%  4. Intermediate-risk stress test findings*.  *2012 Appropriate Use Criteria for Coronary Revascularization Focused Update: J Am Coll Cardiol. N6492421. http://content.[email or web address].aspx?articleid=1201161   Electronically Signed   By: Rozann Lesches M.D.   On: 04/18/2014 16:17    Principal Problem:   ST elevation myocardial infarction (STEMI) of inferolateral wall Active Problems:   Mixed hyperlipidemia   Essential hypertension, benign   HOH (hard of hearing)   Prediabetes   CKD (chronic kidney disease) stage 3, GFR 30-59 ml/min   Anemia   Transaminitis    ASSESSMENT: 79 yo M with pmh of CAD s/p CABG (2001), HTN, HL, and CKD Stage 3 who presented with chest pain found to have acute inferolateral STEMI s/p catherization with DES x 2 SVG to OM.  PLAN:   Acute Inferolateral Wall STEMI s/p PCI in setting of ASCAD s/p CABG (2001) - Currently CP-free. Troponin this AM still >20. He is s/p DES x 2 SVG to OM. He is to have PCI of the SVG to RCA and relook of SVG to OM due to non-optimal flow tomorrow morning.  His 2D-echo yesterday showed EF 55-60% with akinesis of the basal-midinferolateral wall. Currently on dual AP therapy with aspirin 81 mg daily and brilinta 90 mg BID which he is to continue for at  least 1 year.  Also on metoprolol tartrate 25 mg BID and atorvastatin 80 mg daily. Hold home lisinopril  20 mg daily in setting of recent AKI. Pt to continue phase 1 cardiac rehab.  Hypertension - Currently mildly hypertensive on metoprolol tartrate 25 mg BID. Pt at home on lisinopril 20 mg daily and imdur 60 mg daily. Hold home lisinopril  20 mg daily in setting of AKI. Hyperlipidemia - LDL 81 not at goal <70. Continue atorvastatin 80 mg daily. Pt at home on simvastatin 40 mg daily.  Nonoliguric AKI on CKD Stage 3 - Cr stable at 1.22 after IV fluids, now at baseline Cr of 1.2. Hold home lisinopril  20 mg daily. Transaminitis - AST 220 and ALT 73 in 3:1 ratio most likely due to alcoholic liver disease. No recent abdominal imaging. Will check acute hepatitis panel. Continue to monitor as outpatient.  Acute on chronic normocytic anemia - Hg 12 near baseline 13 (  1 yr ago) with no active bleeding. Continue to monitor.  Pre-diabetes - A1c 5.9. Continue lifestyle modification. Consider metformin as outpatient.   Stable for transfer to telemetry today with plan of scheduled PCI tomorrow morning.   Juluis Mire, MD  PGY-II IMTS PagerPW:[phone] 04/24/2014  11:16 AM  Attending note by Dr. Tamala Julian to follow

## 2014-04-25 ENCOUNTER — Encounter (HOSPITAL_COMMUNITY)
Admission: EM | Disposition: A | Discharge status: Home / Self Care | Payer: Self-pay | Source: Other Acute Inpatient Hospital | Attending: Cardiovascular Disease

## 2014-04-25 HISTORY — PX: PERCUTANEOUS CORONARY STENT INTERVENTION (PCI-S): SHX5485

## 2014-04-25 LAB — PROTIME-INR
INR: 1.06 (ref 0.00–1.49)
PROTHROMBIN TIME: 13.9 s (ref 11.6–15.2)

## 2014-04-25 LAB — CBC
HEMATOCRIT: 35.8 % — AB (ref 39.0–52.0)
Hemoglobin: 11.7 g/dL — ABNORMAL LOW (ref 13.0–17.0)
MCH: 28.4 pg (ref 26.0–34.0)
MCHC: 32.7 g/dL (ref 30.0–36.0)
MCV: 86.9 fL (ref 78.0–100.0)
Platelets: 209 10*3/uL (ref 150–400)
RBC: 4.12 MIL/uL — ABNORMAL LOW (ref 4.22–5.81)
RDW: 13.1 % (ref 11.5–15.5)
WBC: 6.6 10*3/uL (ref 4.0–10.5)

## 2014-04-25 LAB — COMPREHENSIVE METABOLIC PANEL
ALBUMIN: 3.4 g/dL — AB (ref 3.5–5.2)
ALK PHOS: 47 U/L (ref 39–117)
ALT: 53 U/L (ref 0–53)
ANION GAP: 15 (ref 5–15)
AST: 98 U/L — ABNORMAL HIGH (ref 0–37)
BILIRUBIN TOTAL: 0.7 mg/dL (ref 0.3–1.2)
BUN: 24 mg/dL — AB (ref 6–23)
CHLORIDE: 107 meq/L (ref 96–112)
CO2: 20 mEq/L (ref 19–32)
Calcium: 9.2 mg/dL (ref 8.4–10.5)
Creatinine, Ser: 1.06 mg/dL (ref 0.50–1.35)
GFR calc Af Amer: 76 mL/min — ABNORMAL LOW (ref >90)
GFR calc non Af Amer: 66 mL/min — ABNORMAL LOW (ref >90)
Glucose, Bld: 100 mg/dL — ABNORMAL HIGH (ref 70–99)
POTASSIUM: 4.1 meq/L (ref 3.7–5.3)
Sodium: 142 mEq/L (ref 137–147)
TOTAL PROTEIN: 6.5 g/dL (ref 6.0–8.3)

## 2014-04-25 LAB — POCT ACTIVATED CLOTTING TIME: ACTIVATED CLOTTING TIME: 467 s

## 2014-04-25 LAB — HEPATITIS PANEL, ACUTE
HCV Ab: NEGATIVE
HEP B C IGM: NONREACTIVE
Hep A IgM: NONREACTIVE
Hepatitis B Surface Ag: NEGATIVE

## 2014-04-25 SURGERY — PERCUTANEOUS CORONARY STENT INTERVENTION (PCI-S)
Anesthesia: LOCAL

## 2014-04-25 MED ORDER — FENTANYL CITRATE 0.05 MG/ML IJ SOLN
INTRAMUSCULAR | Status: AC
  Filled 2014-04-25: qty 2

## 2014-04-25 MED ORDER — HEPARIN (PORCINE) IN NACL 2-0.9 UNIT/ML-% IJ SOLN
INTRAMUSCULAR | Status: AC
  Filled 2014-04-25: qty 1000

## 2014-04-25 MED ORDER — ASPIRIN 81 MG PO CHEW
81.0000 mg | CHEWABLE_TABLET | Freq: Every day | ORAL | Status: DC
  Filled 2014-04-25: qty 1

## 2014-04-25 MED ORDER — TICAGRELOR 90 MG PO TABS: 90.0000 mg | ORAL_TABLET | Freq: Two times a day (BID) | ORAL | Status: DC

## 2014-04-25 MED ORDER — BIVALIRUDIN 250 MG IV SOLR
INTRAVENOUS | Status: AC
  Filled 2014-04-25: qty 250

## 2014-04-25 MED ORDER — LIDOCAINE HCL (PF) 1 % IJ SOLN
INTRAMUSCULAR | Status: AC
  Filled 2014-04-25: qty 30

## 2014-04-25 MED ORDER — SODIUM CHLORIDE 0.9 % IV SOLN
1.0000 mL/kg/h | INTRAVENOUS | Status: AC
  Administered 2014-04-25: 11:00:00 1 mL/kg/h via INTRAVENOUS

## 2014-04-25 MED ORDER — MIDAZOLAM HCL 2 MG/2ML IJ SOLN
INTRAMUSCULAR | Status: AC
  Filled 2014-04-25: qty 2

## 2014-04-25 MED ORDER — VERAPAMIL HCL 2.5 MG/ML IV SOLN
INTRAVENOUS | Status: AC
  Filled 2014-04-25: qty 2

## 2014-04-25 MED ORDER — NITROGLYCERIN 1 MG/10 ML FOR IR/CATH LAB
INTRA_ARTERIAL | Status: AC
  Filled 2014-04-25: qty 10

## 2014-04-25 MED ORDER — MAGNESIUM HYDROXIDE 400 MG/5ML PO SUSP
30.0000 mL | Freq: Once | ORAL | Status: AC
  Administered 2014-04-25: 30 mL via ORAL
  Filled 2014-04-25: qty 30

## 2014-04-25 NOTE — CV Procedure (Signed)
CARDIAC CATH NOTE  Name: Billy [name] MRN: [id number] DOB: [birth year]  Procedure: PTCA and stenting of the SVG to RCA  Indication: 79 yo WM presented with an inferolateral STEMI and had emergent stenting of the SVG to OMs. This was complicated by no reflow. He also had a 70% stenosis in the distal SVG to RCA and returns for PCI of this lesion.  Procedural Details: The right wrist was prepped, draped, and anesthetized with 1% lidocaine. Using the modified Seldinger technique, a 6 Fr slender sheath was introduced into the radial artery. 3 mg verapamil was administered through the radial sheath. Weight-based bivalirudin was given for anticoagulation. We initially performed angiography of the SVG to OMs. This demonstrated that the graft to OM 1 and 2 was widely patent with TIMI 3 flow. The SVG to RCA has a 70% stenosis in the distal graft. Once a therapeutic ACT was achieved, a 6 [place] RCB guide catheter was inserted.  A prowater coronary guidewire was used to cross the lesion.   The lesion was then primarily stented with a 3.0 x 16 mm Promus stent to 14 atm.    Following PCI, there was 0% residual stenosis and TIMI-3 flow. Final angiography confirmed an excellent result. The patient tolerated the procedure well. There were no immediate procedural complications. A TR band was used for radial hemostasis. The patient was transferred to the post catheterization recovery area for further monitoring.  Lesion Data: Vessel: SVG to RCA Percent stenosis (pre): 70% TIMI-flow (pre):  3 Stent:  3.0 x 16 mm Promus Percent stenosis (post): 0% TIMI-flow (post): 3  Conclusions:  Successful stenting of the SVG to RCA with DES.  Good patency of SVG to OM 1 and 2.  Recommendations: continue DAPT for one year  Peter Martinique, [place] 04/25/2014, 8:15 AM

## 2014-04-25 NOTE — Interval H&P Note (Signed)
History and Physical Interval Note:  04/25/2014 7:42 AM  Billy [name]  has presented today for surgery, with the diagnosis of cad  The various methods of treatment have been discussed with the patient and family. After consideration of risks, benefits and other options for treatment, the patient has consented to  Procedure(s): PERCUTANEOUS CORONARY STENT INTERVENTION (PCI-S) (N/A) as a surgical intervention .  The patient's history has been reviewed, patient examined, no change in status, stable for surgery.  I have reviewed the patient's chart and labs.  Questions were answered to the patient's satisfaction.   Cath Lab Visit (complete for each Cath Lab visit)  Clinical Evaluation Leading to the Procedure:   ACS: Yes.    Non-ACS:    Anginal Classification: CCS IV  Anti-ischemic medical therapy: Minimal Therapy (1 class of medications)  Non-Invasive Test Results: No non-invasive testing performed  Prior CABG: Previous CABG        [facility] 04/25/2014 7:42 AM

## 2014-04-25 NOTE — H&P (View-Only) (Signed)
SUBJECTIVE:  Pt seen and examined in AM. No acute events overnight. Telemetry with NSR.  He currently denies chest pain, dyspnea, or LE edema. His 2D-echo yesterday showed EF 55-60% with akinesis of the basal-midinferolateral wall. He is scheduled for PCI of the SVG to RCA and relook of SVG to OM tomorrow morning.    OBJECTIVE:   Vitals:   Filed Vitals:   04/24/14 0342 04/24/14 0500 04/24/14 0730 04/24/14 1111  BP: 139/80  146/71 141/68  Pulse: 70  65   Temp: 97.9 F (36.6 C)  97.4 F (36.3 C) 97.1 F (36.2 C)  TempSrc: Oral  Oral Oral  Resp: 16     Height:      Weight:  138 lb 14.2 oz (63 kg)    SpO2: 98%  96%    I&O's:    Intake/Output Summary (Last 24 hours) at 04/24/14 1116 Last data filed at 04/24/14 0800  Gross per 24 hour  Intake 1105.4 ml  Output   2000 ml  Net -894.6 ml   TELEMETRY: Reviewed telemetry pt in NSR:     PHYSICAL EXAM General: Well developed, well nourished, in no acute distress Head:   Normal cephalic and atramatic  Lungs:  Clear bilaterally to auscultation. Heart:   HRRR S1 S2  No JVD.   Abdomen: Abdomen soft and non-tender Msk:  Back normal,  Normal strength and tone for age. Extremities:  No edema.   Neuro: Alert and oriented. Psych:  Normal affect, responds appropriately Skin: No rash   LABS: Basic Metabolic Panel:  Recent Labs  04/23/14 0724 04/24/14 0339  NA 141 144  K 4.0 4.8  CL 103 109  CO2 21 22  GLUCOSE 114* 109*  BUN 27* 28*  CREATININE 1.10 1.22  CALCIUM 9.2 9.3   Liver Function Tests:  Recent Labs  04/24/14 0339  AST 220*  ALT 73*  ALKPHOS 46  BILITOT 0.5  PROT 6.6  ALBUMIN 3.5   No results for input(s): LIPASE, AMYLASE in the last 72 hours. CBC:  Recent Labs  04/23/14 0724 04/24/14 0339  WBC 9.6 7.0  HGB 12.2* 12.0*  HCT 36.2* 36.8*  MCV 87.7 90.6  PLT 295 204   Cardiac Enzymes:  Recent Labs  04/23/14 0057 04/23/14 0724 04/24/14 0339  TROPONINI >20.00* >20.00* >20.00*    BNP: Invalid input(s): POCBNP D-Dimer: No results for input(s): DDIMER in the last 72 hours. Hemoglobin A1C:  Recent Labs  04/22/14 2002  HGBA1C 5.9*   Fasting Lipid Panel:  Recent Labs  04/23/14 0057  CHOL 134  HDL 33*  LDLCALC 81  TRIG 99  CHOLHDL 4.1   Thyroid Function Tests:  Recent Labs  04/22/14 2002  TSH 2.690   Anemia Panel: No results for input(s): VITAMINB12, FOLATE, FERRITIN, TIBC, IRON, RETICCTPCT in the last 72 hours. Coag Panel:   No results found for: INR, PROTIME  RADIOLOGY: Nm Myocar Multi W/spect W/wall Motion / Ef  04/18/2014   CLINICAL DATA:  79 year old male with history of hypertension, hyperlipidemia, and multivessel CAD status post CABG. He has had increasing angina symptoms, and this study is requested to evaluate for the presence of ischemia.  EXAM: MYOCARDIAL IMAGING WITH SPECT (REST AND PHARMACOLOGIC-STRESS)  GATED LEFT VENTRICULAR WALL MOTION STUDY  LEFT VENTRICULAR EJECTION FRACTION  TECHNIQUE: Standard myocardial SPECT imaging was performed after resting intravenous injection of 10 mCi Tc-50m sestamibi. Subsequently, intravenous infusion of Lexiscan was performed under the supervision of the Cardiology staff. At peak effect of  the drug, 30 mCi Tc-96m sestamibi was injected intravenously and standard myocardial SPECT imaging was performed. Quantitative gated imaging was also performed to evaluate left ventricular wall motion, and estimate left ventricular ejection fraction.  FINDINGS: Baseline tracing shows normal sinus rhythm at 62 beats per min with poor R wave progression and nonspecific ST changes. Lexiscan bolus was given in standard fashion. Heart rate increased from 62 beats per min up to 94 beats per min, and blood pressure increased from 170/79 up to 184/75. There was nondiagnostic accentuation in resting ST segment abnormalities. No arrhythmias were noted.  Analysis of the overall perfusion data finds adequate myocardial radiotracer  uptake.  Perfusion: There is a moderate-size inferoseptal and inferolateral defect from apex to base that is partially reversible, noted in the setting of diaphragmatic attenuation. This is suggestive of scar with paired moderate peri-infarct ischemia.  Wall Motion: Normal left ventricular wall motion. No left ventricular dilation.  Left Ventricular Ejection Fraction: 60 %  End diastolic volume 73 ml  End systolic volume 29 ml  IMPRESSION: 1. Abnormal study. Region of inferoseptal and inferolateral scar with moderate peri-infarct ischemia noted, study quality is affected by diaphragmatic attenuation.  2. Normal left ventricular wall motion.  3. Left ventricular ejection fraction 60%  4. Intermediate-risk stress test findings*.  *2012 Appropriate Use Criteria for Coronary Revascularization Focused Update: J Am Coll Cardiol. N6492421. http://content.airportbarriers.com.aspx?articleid=1201161   Electronically Signed   By: Rozann Lesches M.D.   On: 04/18/2014 16:17    Principal Problem:   ST elevation myocardial infarction (STEMI) of inferolateral wall Active Problems:   Mixed hyperlipidemia   Essential hypertension, benign   HOH (hard of hearing)   Prediabetes   CKD (chronic kidney disease) stage 3, GFR 30-59 ml/min   Anemia   Transaminitis    ASSESSMENT: 79 yo M with pmh of CAD s/p CABG (2001), HTN, HL, and CKD Stage 3 who presented with chest pain found to have acute inferolateral STEMI s/p catherization with DES x 2 SVG to OM.  PLAN:   Acute Inferolateral Wall STEMI s/p PCI in setting of ASCAD s/p CABG (2001) - Currently CP-free. Troponin this AM still >20. He is s/p DES x 2 SVG to OM. He is to have PCI of the SVG to RCA and relook of SVG to OM due to non-optimal flow tomorrow morning.  His 2D-echo yesterday showed EF 55-60% with akinesis of the basal-midinferolateral wall. Currently on dual AP therapy with aspirin 81 mg daily and brilinta 90 mg BID which he is to continue for at  least 1 year.  Also on metoprolol tartrate 25 mg BID and atorvastatin 80 mg daily. Hold home lisinopril  20 mg daily in setting of recent AKI. Pt to continue phase 1 cardiac rehab.  Hypertension - Currently mildly hypertensive on metoprolol tartrate 25 mg BID. Pt at home on lisinopril 20 mg daily and imdur 60 mg daily. Hold home lisinopril  20 mg daily in setting of AKI. Hyperlipidemia - LDL 81 not at goal <70. Continue atorvastatin 80 mg daily. Pt at home on simvastatin 40 mg daily.  Nonoliguric AKI on CKD Stage 3 - Cr stable at 1.22 after IV fluids, now at baseline Cr of 1.2. Hold home lisinopril  20 mg daily. Transaminitis - AST 220 and ALT 73 in 3:1 ratio most likely due to alcoholic liver disease. No recent abdominal imaging. Will check acute hepatitis panel. Continue to monitor as outpatient.  Acute on chronic normocytic anemia - Hg 12 near baseline 13 (  1 yr ago) with no active bleeding. Continue to monitor.  Pre-diabetes - A1c 5.9. Continue lifestyle modification. Consider metformin as outpatient.   Stable for transfer to telemetry today with plan of scheduled PCI tomorrow morning.   Juluis Mire, MD  PGY-II IMTS PagerSG:[phone] 04/24/2014  11:16 AM  Attending note by Dr. Tamala Julian to follow

## 2014-04-26 DIAGNOSIS — R7309 Other abnormal glucose: Secondary | ICD-10-CM

## 2014-04-26 DIAGNOSIS — Z955 Presence of coronary angioplasty implant and graft: Secondary | ICD-10-CM

## 2014-04-26 LAB — BASIC METABOLIC PANEL
ANION GAP: 15 (ref 5–15)
BUN: 21 mg/dL (ref 6–23)
CHLORIDE: 107 meq/L (ref 96–112)
CO2: 20 meq/L (ref 19–32)
CREATININE: 0.99 mg/dL (ref 0.50–1.35)
Calcium: 9.3 mg/dL (ref 8.4–10.5)
GFR calc Af Amer: 89 mL/min — ABNORMAL LOW (ref >90)
GFR calc non Af Amer: 77 mL/min — ABNORMAL LOW (ref >90)
Glucose, Bld: 103 mg/dL — ABNORMAL HIGH (ref 70–99)
Potassium: 4.4 mEq/L (ref 3.7–5.3)
Sodium: 142 mEq/L (ref 137–147)

## 2014-04-26 LAB — CBC
HEMATOCRIT: 37.8 % — AB (ref 39.0–52.0)
HEMOGLOBIN: 12.7 g/dL — AB (ref 13.0–17.0)
MCH: 29.6 pg (ref 26.0–34.0)
MCHC: 33.6 g/dL (ref 30.0–36.0)
MCV: 88.1 fL (ref 78.0–100.0)
PLATELETS: 250 10*3/uL (ref 150–400)
RBC: 4.29 MIL/uL (ref 4.22–5.81)
RDW: 13.2 % (ref 11.5–15.5)
WBC: 7.2 10*3/uL (ref 4.0–10.5)

## 2014-04-26 MED ORDER — TICAGRELOR 90 MG PO TABS: 90.0000 mg | ORAL_TABLET | Freq: Two times a day (BID) | ORAL | Status: DC

## 2014-04-26 MED ORDER — METOPROLOL TARTRATE 25 MG PO TABS: 25.0000 mg | ORAL_TABLET | Freq: Two times a day (BID) | ORAL | Status: DC

## 2014-04-26 MED FILL — Sodium Chloride IV Soln 0.9%: INTRAVENOUS | Qty: 50 | Status: AC

## 2014-04-26 NOTE — Discharge Instructions (Signed)

## 2014-04-26 NOTE — Progress Notes (Signed)
UR completed Lareen Mullings K. Yahaira Bruski, RN, BSN, MSHL, CCM  04/26/2014 11:44 AM

## 2014-04-26 NOTE — Discharge Summary (Signed)
Discharge Summary   Patient ID: Billy [name],  MRN: [id number], DOB/AGE: 10-May-1935 79 y.o.  Admit date: 04/22/2014 Discharge date: 04/26/2014  Primary Care Provider: Judd Lien E Primary Cardiologist: Dr. Domenic Polite  Discharge Diagnoses Principal Problem:   ST elevation myocardial infarction (STEMI) of inferolateral wall  - 04/2014 DES to SVG to OM, DES to SVG to RCA Active Problems:   Mixed hyperlipidemia   Essential hypertension, benign   HOH (hard of hearing)   Prediabetes   CKD (chronic kidney disease) stage 3, GFR 30-59 ml/min   Anemia   Transaminitis   CAD of autologous artery bypass graft without angina   Allergies No Known Allergies  Procedures  Echocardiogram 04/23/2014 Study Conclusions  - Left ventricle: E/e&'>14.5 suggestive of elevated LV filling pressures. The cavity size was normal. There was mild focal basal hypertrophy of the septum. Systolic function was normal. The estimated ejection fraction was in the range of 55% to 60%. There is akinesis of the basal-midinferolateral myocardium. - Aortic valve: Mildly calcified leaflets. There was trivial regurgitation. - Mitral valve: There was mild regurgitation. - Atrial septum: There was increased thickness of the septum, consistent with lipomatous hypertrophy. - Pulmonary arteries: PA peak pressure: 44 mm Hg (S).  Impressions:  - The right ventricular systolic pressure was increased consistent with moderate pulmonary hypertension.    Cardiac catheterization #1 04/22/2014 Procedure Performed:  1. Left Heart Catheterization 2. Selective Coronary Angiography 3. SVG angiography 4. LIMA graft angiography 5. PTCA/DES x 2 SVG to OM  Hemodynamic Findings: Central aortic pressure: 157/79 Left ventricular pressure: 147/9/25  Angiographic Findings:  Left main: 30% stenosis.   Left Anterior Descending Artery: Large caliber vessel that courses to the apex. 90% proximal stenosis.  Diagonal occluded. Mild disease mid and distal vessel. Competitive filling seen from IMA graft.   Circumflex Artery: Moderate caliber vessel. 100% mid occlusion. OM 1 and 2 fill from the bypass graft (seen after the graft was opened)  Right Coronary Artery: Moderate caliber dominant vessel with 100% mid occlusion. Distal vessel fills from the patent vein graft.   Graft Anatomy:  SVG to Diagonal is patent SVG to PDA is patent with severe 70-80% stenosis in the mid and distal body of the vein graft.  SVG to OM1 is occluded in the mid body of the vein graft, thrombotic appearing occlusion.  LIMA to mid LAD is patent  Left Ventricular Angiogram: Deferred.   Impression: 1. Acute MI secondary to occluded SVG to OM1 2. Patent SVG to RCA with severe disease in the body of the vein graft as above 3. Patent LIMA to LAD and patent SVG to Diagonal 4. Successful PTCA/DES x 2 SVG to OM, flow restored but not optimal due to distal embolization and large thrombus burden  Recommendations: He will be admitted to the CCU. Echo in am. Will continue Aggrastat for 18 hours. Continue ASA and Brilinta. Will start statin and beta blocker. Will hydrate overnight. Plan PCI of the SVG to RCA and relook of SVG to OM in 2-3 days if renal function stable.    Complications: None. The patient tolerated the procedure well.      Cardiac catheterization #2 04/25/2014 Procedure: PTCA and stenting of the SVG to RCA  Lesion Data: Vessel: SVG to RCA Percent stenosis (pre): 70% TIMI-flow (pre): 3 Stent: 3.0 x 16 mm Promus Percent stenosis (post): 0% TIMI-flow (post): 3  Conclusions:  Successful stenting of the SVG to RCA with DES.  Good patency of SVG to OM 1 and  2.  Recommendations: continue DAPT for one year     Hospital Course  The patient is a 79 year old male with past medical history of hypertension, CKD, cardiac artery disease, history of asymptomatic sinus bradycardia, and  history of coronary artery disease status post CABG. He was recently seen by Dr. Domenic Polite on 04/08/2014 for episodic chest pain. Stress test was performed on 11/12 which was intermediate risk with regional inferoseptal and inferolateral scar with moderate peri-infarct ischemia noted, a trial of medical therapy was planned. He presented to [facility] ED on 04/22/2014 complaining of chest pain started around 2PM that day. The intensity was 10/10 and more severe than previous episodes. He has some associated symptom of shortness breath and diaphoresis, however no nausea or vomiting. The characteristic was similar to the previous angina. He took 4 sublingual nitroglycerin and 325 of aspirin. EKG was felt to be abnormal however did not reach STEMI criteria. He was transferred urgently to [facility] and was taken directly to the cath lab. Prior to the transfer, he was placed on IV heparin and IV nitroglycerin. On arrival, he continued to have chest pain although improved. Cardiac catheterization after arrival on 04/22/2014 revealed 30% left main stenosis, 90% proximal LAD stenosis, occluded diagonal, 100% mid left circumflex occlusion, 100% mid RCA occlusion, patent SVG to diagonal, patent SVG to PDA with 70-80% mid vessel stenosis, patent LIMA to the mid LAD, occluded mid SVG to OM1. The occluded SVG to OM1 was likely the culprit lesion which was treated with drug-eluting stent. Flow was restored however not optimal due to distal embolization and a large thrombus burden. He was discharged to the ICU to continue Aggrastat for 18 hours and also aspirin and Brilinta.   Echocardiogram was obtained on 04/23/2014 which showed EF 55-60%, akinesis of the basal mid inferolateral myocardium, mild MR, PA peak pressure 44 mmHg consistent with moderate pulmonary hypertension. His home lisinopril was held post cardiac catheterization in the setting of acute renal insufficiency. Patient returned to the cath lab on 11/19 for  relook cardiac catheterization and PCI of SVG to RCA. Cardiac catheterization showed good patency of SVG to OM1 and 2, successful drug-eluting stent placement in SVG to RCA. Patient was seen in the morning of 04/26/2014, at which time he denies any significant shortness of breath or chest discomfort. He has been seen by cardiac rehabilitation. He is deemed stable for discharge from cardiology perspective. I will schedule follow-up with Dr. Domenic Polite in our [place] office in 2-4 weeks.  Of note, during this hospitalization, metoprolol was added to his medical regimen and previous lisinopril has been held. On discharge, his blood pressure is in the 140s, I will resume the previous Imdur and continue to hold the lisinopril. I will defer to Dr. Domenic Polite whether to restart on the previous lisinopril. His renal function at this time is good with serum creatinine 0.9. Of note, patient does have a history of asymptomatic bradycardia, he currently tolerates metoprolol with HR 50-80s, however if he has symptomatic bradycardia in the future, his metoprolol will need to be held back. I have called [employer] for prior authorization of Brilinta, and was told he does not prior authorization.   Discharge Vitals Blood pressure 140/88, pulse 67, temperature 98.3 F (36.8 C), temperature source Oral, resp. rate 18, height 5\' 8"  (1.727 m), weight 136 lb 14.5 oz (62.1 kg), SpO2 97 %.  Filed Weights   04/25/14 0500 04/25/14 2300 04/26/14 0149  Weight: 139 lb 8.8 oz (63.3 kg)  136 lb 14.5 oz (62.1 kg) 136 lb 14.5 oz (62.1 kg)   Physical Exam: General: Pleasant, NAD Psych: Normal affect. Neuro: Alert and oriented X 3. Moves all extremities spontaneously. HEENT: Normal  Neck: Supple without bruits or JVD. Lungs:  Resp regular and unlabored, CTA. Heart: RRR no s3, s4, or murmurs. Radial cath site stable, no bleeding or hematoma. R groin cath site does have a hematoma, no bruit, no pulsatile, no significant  tenderness or bleeding. Abdomen: Soft, non-tender, non-distended, BS + x 4.  Extremities: No clubbing, cyanosis or edema. DP/PT/Radials 2+ and equal bilaterally.   Labs  CBC  Recent Labs  04/25/14 0431 04/26/14 0350  WBC 6.6 7.2  HGB 11.7* 12.7*  HCT 35.8* 37.8*  MCV 86.9 88.1  PLT 209 [id number]   Basic Metabolic Panel  Recent Labs  04/25/14 0431 04/26/14 0350  NA 142 142  K 4.1 4.4  CL 107 107  CO2 20 20  GLUCOSE 100* 103*  BUN 24* 21  CREATININE 1.06 0.99  CALCIUM 9.2 9.3   Liver Function Tests  Recent Labs  04/24/14 0339 04/25/14 0431  AST 220* 98*  ALT 73* 53  ALKPHOS 46 47  BILITOT 0.5 0.7  PROT 6.6 6.5  ALBUMIN 3.5 3.4*   Cardiac Enzymes  Recent Labs  04/24/14 0339  TROPONINI >20.00*    Disposition  Pt is being discharged home today in good condition.  Follow-up Plans & Appointments      Follow-up Information    Follow up with Rozann Lesches, MD On 05/17/2014.   Specialty:  Cardiology   Why:  2:20am   Contact information:   [place] [address] [phone]       Discharge Medications    Medication List    STOP taking these medications        lisinopril 20 MG tablet  Commonly known as:  PRINIVIL,ZESTRIL      TAKE these medications        aspirin EC 81 MG tablet  Take 81 mg by mouth daily.     COLCRYS 0.6 MG tablet  Generic drug:  colchicine  Take 1 tablet by mouth Once daily as needed.     cyclobenzaprine 5 MG tablet  Commonly known as:  FLEXERIL  Take 5 mg by mouth 3 (three) times daily as needed for muscle spasms.     ibuprofen 200 MG tablet  Commonly known as:  ADVIL,MOTRIN  Take 200 mg by mouth every 6 (six) hours as needed for moderate pain.     isosorbide mononitrate 60 MG 24 hr tablet  Commonly known as:  IMDUR  Take 1 tablet (60 mg total) by mouth daily.     metoprolol tartrate 25 MG tablet  Commonly known as:  LOPRESSOR  Take 1 tablet (25 mg total) by mouth 2 (two) times daily.      nitroGLYCERIN 0.4 MG SL tablet  Commonly known as:  NITROSTAT  Place 1 tablet (0.4 mg total) under the tongue every 5 (five) minutes as needed.     Saw Palmetto (Serenoa repens) 450 MG Caps  Take by mouth. Take 1 capsule in AM and 2 capsules in PM     simvastatin 40 MG tablet  Commonly known as:  ZOCOR  Take 0.5 tablets (20 mg total) by mouth daily.     ticagrelor 90 MG Tabs tablet  Commonly known as:  BRILINTA  Take 1 tablet (90 mg total) by mouth 2 (two) times  daily.        Duration of Discharge Encounter   Greater than 30 minutes including physician time.  Hilbert Corrigan PA-C Pager: [phone] 04/26/2014, 10:47 AM

## 2014-04-26 NOTE — Progress Notes (Signed)
CARDIAC REHAB PHASE I   PRE:  Rate/Rhythm: 73 SR  BP:  Supine: 140/37  Sitting:   Standing:    SaO2:   MODE:  Ambulation: 1000 ft   POST:  Rate/Rhythm: 95 SR  BP:  Supine:   Sitting: 172/94  Standing:    SaO2:  0850-0940 Pt walked 1000 ft with steady gait. Tolerated well. NO CP. MI education completed with pt and wife who voiced understanding. Reviewed correct way to use NTG as pt took 4 at home and stated how he almost blacked out. Re enforced brilinta use with stents. Gave ex ed and hearth healthy eating tips. Discussed CRP 2 and permission given to refer to [facility].    Graylon Good, RN BSN  04/26/2014 9:36 AM

## 2014-04-27 ENCOUNTER — Telehealth: Payer: Self-pay | Admitting: Physician Assistant

## 2014-04-27 NOTE — Telephone Encounter (Signed)
Mrs. Billy [name] called because his systolic blood pressures in the 90s and he feels bad. He has had all of his usual morning medications including metoprolol 25 mg and isosorbide 60 mg.  His heart rate is in the 50s. He is having no bleeding issues, no chest pain and no shortness of breath. He feels generally bad and believes it is from his low blood pressure.  Advised his wife she should cut the metoprolol in half, and take the isosorbide 60 mg once a day instead of twice a day. Advised her that she should continue other medications as prescribed especially the Brilinta. Advised her that he was having any bleeding issues or signs of an acute illness, she needed to get him to the closest emergency room. Otherwise, it's just a question of letting the medications wear off and making sure he is well hydrated.  He currently does not feel he needs to be seen and they were in agreement with this as a plan of care.

## 2014-04-29 ENCOUNTER — Telehealth: Payer: Self-pay | Admitting: *Deleted

## 2014-04-29 DIAGNOSIS — Z955 Presence of coronary angioplasty implant and graft: Secondary | ICD-10-CM | POA: Insufficient documentation

## 2014-04-29 NOTE — Telephone Encounter (Signed)
Message left from wife wanting to know if patient can take benadryl with other medications for allergies. Wife also informed nurse that she spoke with Rosaria Ferries on Sunday and was informed to make changes to medications due to low heart rates running in the 50's. Readings given since change in medications:134/69 HR 66. After walking 10 minutes 170/70 HR 71. Wife informed that benadryl is safe to use with other medications. Per wife, patient has been taking lisinopril since discharge. Nurse advised wife that lisinopril wasn't listed on medlist. Medications reviewed with wife while on phone.

## 2014-05-06 ENCOUNTER — Telehealth: Payer: Self-pay | Admitting: *Deleted

## 2014-05-06 NOTE — Telephone Encounter (Signed)
Wife called to give a listing of BP readings for patient and to make sure they were within normal range. 130/69 before walking 119/63 before walking 147/71 after walking 150/76 after walking  Nurse advised wife that all of these readings were reasonable and to continue recording the readings and bring to next office visit for MD to review.

## 2014-05-16 ENCOUNTER — Encounter (HOSPITAL_COMMUNITY): Payer: Self-pay | Admitting: Cardiovascular Disease

## 2014-05-17 ENCOUNTER — Encounter: Payer: Self-pay | Admitting: Cardiology

## 2014-05-17 ENCOUNTER — Ambulatory Visit (INDEPENDENT_AMBULATORY_CARE_PROVIDER_SITE_OTHER): Payer: Medicare HMO | Admitting: Cardiology

## 2014-05-17 VITALS — BP 121/62 | HR 59 | Ht 67.5 in | Wt 140.0 lb

## 2014-05-17 DIAGNOSIS — I739 Peripheral vascular disease, unspecified: Secondary | ICD-10-CM

## 2014-05-17 DIAGNOSIS — E782 Mixed hyperlipidemia: Secondary | ICD-10-CM

## 2014-05-17 DIAGNOSIS — I25810 Atherosclerosis of coronary artery bypass graft(s) without angina pectoris: Secondary | ICD-10-CM

## 2014-05-17 DIAGNOSIS — I10 Essential (primary) hypertension: Secondary | ICD-10-CM

## 2014-05-17 DIAGNOSIS — I779 Disorder of arteries and arterioles, unspecified: Secondary | ICD-10-CM

## 2014-05-17 NOTE — Assessment & Plan Note (Signed)
Mild nonobstructive disease by recent carotid Dopplers.

## 2014-05-17 NOTE — Progress Notes (Signed)
Reason for visit: Hospital follow-up, CAD, hypertension, carotid artery disease  Clinical Summary Billy [name] is a 79 y.o.male last seen in November. At that time he reported more frequent exertional angina symptoms and we referred him on medical therapy for a Lexiscan Cardiolite. This study was overall intermediate risk showing an area of scar in the inferoseptal and inferolateral wall with moderate peri-infarct ischemia, LVEF 60%. Following this, Imdur was increased with plan to observe symptom control, reserving heart catheterization for further escalating symptoms. He did end up presenting to the hospital with worsening chest pain symptoms and NSTEMI, underwent heart catheterization demonstrating occlusion of the SVG to obtuse marginal system which was treated with 2 drug-eluting stents. Otherwise had patent SVG to RCA with severe disease in the body of the graft, patent LIMA to LAD, and patent SVG to diagonal. He underwent staged intervention to the vein graft to the RCA prior to discharge. Medical therapy was also modified including discontinuation of ACE inhibitor with renal insufficiency, also addition of beta blocker, although he does have history of symptomatic bradycardia.   Echocardiogram from around the time of his event showed LVEF 55-60% with elevated filling pressures, inferolateral wall motion abnormality, mild mitral regurgitation, PASP 44 mmHg.  Peak troponin I level was greater than 20. Lipid panel showed cholesterol 134, triglycerides 99, HDL 33, and LDL 81. Creatinine at discharge was 0.9.  Follow-up carotid Dopplers in November showed 1-39% bilateral ICA stenoses, less than estimated on previous study.  He is here with his wife today. States he feels better. He is not reporting any angina symptoms. Gets periodic shortness of breath without any definite precipitant, not always exertional, I wonder whether it could potentially be related to Brilinta. He reports compliance with his  medications, no bleeding problems.  Home blood pressures reviewed, most systolics are in the [id number] to 130s, outlined in the 140s to 150s. He continues off ACE inhibitor.  Today we reviewed his recent hospital stay, testing, and plan for management.   No Known Allergies  Current Outpatient Prescriptions  Medication Sig Dispense Refill  . aspirin EC 81 MG tablet Take 81 mg by mouth daily.      Marland Kitchen COLCRYS 0.6 MG tablet Take 1 tablet by mouth Once daily as needed.    . cyclobenzaprine (FLEXERIL) 5 MG tablet Take 5 mg by mouth 3 (three) times daily as needed for muscle spasms.     Marland Kitchen ibuprofen (ADVIL,MOTRIN) 200 MG tablet Take 200 mg by mouth every 6 (six) hours as needed for moderate pain.     . isosorbide mononitrate (IMDUR) 60 MG 24 hr tablet Take 1 tablet (60 mg total) by mouth daily. (Patient taking differently: Take 30 mg by mouth daily. ) 90 tablet 3  . metoprolol tartrate (LOPRESSOR) 25 MG tablet Take 1 tablet (25 mg total) by mouth 2 (two) times daily. 60 tablet 11  . nitroGLYCERIN (NITROSTAT) 0.4 MG SL tablet Place 1 tablet (0.4 mg total) under the tongue every 5 (five) minutes as needed. (Patient taking differently: Place 0.4 mg under the tongue every 5 (five) minutes as needed for chest pain. ) 25 tablet 3  . Saw Palmetto, Serenoa repens, 450 MG CAPS Take by mouth. Take 1 capsule in AM and 2 capsules in PM    . simvastatin (ZOCOR) 40 MG tablet Take 0.5 tablets (20 mg total) by mouth daily. 45 tablet 3  . ticagrelor (BRILINTA) 90 MG TABS tablet Take 1 tablet (90 mg total) by mouth 2 (two) times  daily. 180 tablet 3   No current facility-administered medications for this visit.    Past Medical History  Diagnosis Date  . Coronary atherosclerosis of native coronary artery     Multivessel status post CABG, DES x 2 SVG to OM with staged DES SVG to RCA November 2015  . Essential hypertension, benign   . Carotid artery disease     123456 LICA and [id number] RICA - December 2013  . Sinus  bradycardia     Asymptomatic  . Gout   . HOH (hard of hearing)     Past Surgical History  Procedure Laterality Date  . Coronary artery bypass graft  11/09/1999    LIMA to LAD, SVG to diagonal, SVG to OM1 and OM2, SVG to PDA  . Appendectomy      Ruptured  . Cataract extraction w/phaco Right 11/13/2012    Procedure: CATARACT EXTRACTION PHACO AND INTRAOCULAR LENS PLACEMENT (IOC);  Surgeon: Tonny Branch, MD;  Location: [facility];  Service: Ophthalmology;  Laterality: Right;  CDE:12.75  . Cataract extraction w/phaco Left 01/11/2013    Procedure: CATARACT EXTRACTION PHACO AND INTRAOCULAR LENS PLACEMENT (IOC);  Surgeon: Tonny Branch, MD;  Location: [facility];  Service: Ophthalmology;  Laterality: Left;  CDE: 21.13  . Left heart catheterization with coronary angiogram N/A 04/22/2014    Procedure: LEFT HEART CATHETERIZATION WITH CORONARY ANGIOGRAM;  Surgeon: Burnell Blanks, MD;  Location: [facility] CATH LAB;  Service: Cardiovascular;  Laterality: N/A;  . Percutaneous coronary stent intervention (pci-s) N/A 04/25/2014    Procedure: PERCUTANEOUS CORONARY STENT INTERVENTION (PCI-S);  Surgeon: Peter M Martinique, MD;  Location: [facility] CATH LAB;  Service: Cardiovascular;  Laterality: N/A;    Social History Billy [name] reports that he quit smoking about 14 years ago. His smoking use included Cigarettes. He started smoking about 67 years ago. He has a 25 pack-year smoking history. He has never used smokeless tobacco. Billy [name] reports that he drinks alcohol.  Review of Systems Complete review of systems negative except as otherwise outlined in the clinical summary and also the following. No palpitations or syncope. Reports improved dizziness. Stable appetite. No problems with his angiography sites.  Physical Examination Filed Vitals:   05/17/14 1409  BP: 121/62  Pulse: 59   Filed Weights   05/17/14 1409  Weight: 140 lb (63.504 kg)    Patient appears comfortable at rest. HEENT: Conjunctiva and lids normal, oropharynx  clear. Neck: Supple, no elevated JVP, soft left carotid bruit, no thyromegaly. Lungs: Clear to auscultation, nonlabored breathing at rest. Cardiac: Regular rate and rhythm, no S3 or significant systolic murmur, no pericardial rub. Abdomen: Soft, nontender, bowel sounds present, no guarding or rebound. Extremities: No pitting edema, distal pulses 2+. Skin: Warm and dry. Musculoskeletal: No kyphosis. Neuropsychiatric: Alert and oriented x3, affect grossly appropriate.   Problem List and Plan   CAD (coronary artery disease) of artery bypass graft Status post ACS in November with occlusion of the SVG to obtuse marginal system and severe stenosis within the SVG to RCA. Both have been intervened upon with DES as outlined above. He feels better, not completely back to baseline. Plan is to continue DAPT, I asked him to let me know if he still has interval feelings of shortness of breath without obvious precipitant as we might consider changing from Brilinta to another agent. Otherwise continue present medications and follow-up in the next 2 months.  Essential hypertension, benign Continue current regimen, not adding ACE inhibitor back as yet. Keep checking blood pressure at  home.  Carotid artery disease without cerebral infarction Mild nonobstructive disease by recent carotid Dopplers.  Mixed hyperlipidemia Recent LDL 81, continues on Zocor.    Satira Sark, M.D., F.A.C.C.

## 2014-05-17 NOTE — Assessment & Plan Note (Signed)
Recent LDL 81, continues on Zocor.

## 2014-05-17 NOTE — Patient Instructions (Signed)
Your physician recommends that you schedule a follow-up appointment in: 2 months.  Your physician recommends that you continue on your current medications as directed. Please refer to the Current Medication list given to you today.  

## 2014-05-17 NOTE — Assessment & Plan Note (Signed)
Continue current regimen, not adding ACE inhibitor back as yet. Keep checking blood pressure at home.

## 2014-05-17 NOTE — Assessment & Plan Note (Signed)
Status post ACS in November with occlusion of the SVG to obtuse marginal system and severe stenosis within the SVG to RCA. Both have been intervened upon with DES as outlined above. He feels better, not completely back to baseline. Plan is to continue DAPT, I asked him to let me know if he still has interval feelings of shortness of breath without obvious precipitant as we might consider changing from Brilinta to another agent. Otherwise continue present medications and follow-up in the next 2 months.

## 2014-05-28 ENCOUNTER — Other Ambulatory Visit: Payer: Self-pay | Admitting: *Deleted

## 2014-05-28 MED ORDER — METOPROLOL TARTRATE 25 MG PO TABS: 25.0000 mg | ORAL_TABLET | Freq: Two times a day (BID) | ORAL | Status: DC

## 2014-05-28 MED ORDER — TICAGRELOR 90 MG PO TABS: 90.0000 mg | ORAL_TABLET | Freq: Two times a day (BID) | ORAL | Status: DC

## 2014-05-28 MED ORDER — ISOSORBIDE MONONITRATE ER 30 MG PO TB24: 30.0000 mg | ORAL_TABLET | Freq: Every day | ORAL | Status: DC

## 2014-05-28 MED ORDER — SIMVASTATIN 40 MG PO TABS
20.0000 mg | ORAL_TABLET | Freq: Every day | ORAL | Status: DC
Start: 2014-05-28 — End: 2014-12-02

## 2014-05-28 NOTE — Telephone Encounter (Signed)
During last office visit, patient reported that he was taking 30 mg imdur. MD informed and okayed patient to stay on 30 mg instead of 60 mg.

## 2014-06-17 ENCOUNTER — Other Ambulatory Visit: Payer: Self-pay | Admitting: Cardiology

## 2014-06-17 NOTE — Telephone Encounter (Signed)
Spoke with pharmacy and confirmed that all prescriptions sent on 05/28/14 was received and shipped to patient. Per pharmacy staff at [facility], the refill request are coming from the enrollment department and is initiated by the patient. This information will be sent back to cancel request for imdur.

## 2014-06-17 NOTE — Telephone Encounter (Signed)
Received fax refill request  Rx # [id number] Medication:  Isosorbide MN ER 30 mg tablet Qty 90 Sig:  Take one tablet by mouth once daily Physician:  Domenic Polite

## 2014-07-25 ENCOUNTER — Ambulatory Visit (INDEPENDENT_AMBULATORY_CARE_PROVIDER_SITE_OTHER): Payer: Commercial Managed Care - HMO | Admitting: Cardiology

## 2014-07-25 ENCOUNTER — Encounter: Payer: Self-pay | Admitting: Cardiology

## 2014-07-25 VITALS — BP 160/67 | HR 57 | Ht 67.0 in | Wt 144.4 lb

## 2014-07-25 DIAGNOSIS — I10 Essential (primary) hypertension: Secondary | ICD-10-CM

## 2014-07-25 MED ORDER — LISINOPRIL 10 MG PO TABS: 10.0000 mg | ORAL_TABLET | Freq: Every day | ORAL | Status: DC

## 2014-07-25 NOTE — Progress Notes (Signed)
Cardiology Office Note  Date: 07/25/2014   ID: Billy [name], DOB 10-29-1934, MRN [id number]  PCP: Billy Labrum, MD  Primary Cardiologist: Billy Lesches, MD   Chief Complaint  Patient presents with  . Coronary Artery Disease  . Hypertension    History of Present Illness: Billy [name] is a 80 y.o. male last seen in December 2015. He presents for a routine follow-up visit with his wife today. Overall relatively stable from a symptom perspective. He has occasional, fleeting, atypical lower thoracic pains, not exertional. He reports compliance with his medications, states that he feels somewhat "cold" ever since being on Brilinta. He has had no bleeding problems.  Cardiac catheterization in November 2015 in the setting of NSTEMI demonstrated occlusion of the SVG to obtuse marginal system which was treated with 2 drug-eluting stents. Otherwise had patent SVG to RCA with severe disease in the body of the graft, patent LIMA to LAD, and patent SVG to diagonal. He underwent staged intervention to the vein graft to the RCA subsequently.  We reviewed his home blood pressure checks, systolics averaging in the 130s to 140s. We discussed going back on lisinopril at 10 mg daily for now, following up with lab work in a few weeks.  Today we also discussed his echocardiogram findings from November 2015 as well.   Past Medical History  Diagnosis Date  . Coronary atherosclerosis of native coronary artery     Multivessel status post CABG, DES x 2 SVG to OM with staged DES SVG to RCA November 2015  . Essential hypertension, benign   . Carotid artery disease     123456 LICA and [id number] RICA - December 2013  . Sinus bradycardia     Asymptomatic  . Gout   . HOH (hard of hearing)     Past Surgical History  Procedure Laterality Date  . Coronary artery bypass graft  11/09/1999    LIMA to LAD, SVG to diagonal, SVG to OM1 and OM2, SVG to PDA  . Appendectomy      Ruptured  . Cataract extraction  w/phaco Right 11/13/2012    Procedure: CATARACT EXTRACTION PHACO AND INTRAOCULAR LENS PLACEMENT (IOC);  Surgeon: Tonny Branch, MD;  Location: [facility];  Service: Ophthalmology;  Laterality: Right;  CDE:12.75  . Cataract extraction w/phaco Left 01/11/2013    Procedure: CATARACT EXTRACTION PHACO AND INTRAOCULAR LENS PLACEMENT (IOC);  Surgeon: Tonny Branch, MD;  Location: [facility];  Service: Ophthalmology;  Laterality: Left;  CDE: 21.13  . Left heart catheterization with coronary angiogram N/A 04/22/2014    Procedure: LEFT HEART CATHETERIZATION WITH CORONARY ANGIOGRAM;  Surgeon: Burnell Blanks, MD;  Location: [facility] CATH LAB;  Service: Cardiovascular;  Laterality: N/A;  . Percutaneous coronary stent intervention (pci-s) N/A 04/25/2014    Procedure: PERCUTANEOUS CORONARY STENT INTERVENTION (PCI-S);  Surgeon: Peter M Martinique, MD;  Location[facility] C[facility] LAB;  Service: Cardiovascular;  Laterality: N/A;    Current Outpatient Prescriptions  Medication Sig Dispense Refill  . aspirin EC 81 MG tablet Take 81 mg by mouth daily.      Marland Kitchen COLCRYS 0.6 MG tablet Take 1 tablet by mouth Once daily as needed.    . cyclobenzaprine (FLEXERIL) 5 MG tablet Take 5 mg by mouth 3 (three) times daily as needed for muscle spasms.     Marland Kitchen ibuprofen (ADVIL,MOTRIN) 200 MG tablet Take 200 mg by mouth every 6 (six) hours as needed for moderate pain.     . isosorbide mononitrate (IMDUR) 30 MG 24  hr tablet Take 1 tablet (30 mg total) by mouth daily. 90 tablet 3  . lisinopril (PRINIVIL,ZESTRIL) 10 MG tablet Take 1 tablet (10 mg total) by mouth daily. 90 tablet 3  . metoprolol tartrate (LOPRESSOR) 25 MG tablet Take 1 tablet (25 mg total) by mouth 2 (two) times daily. 180 tablet 3  . nitroGLYCERIN (NITROSTAT) 0.4 MG SL tablet Place 1 tablet (0.4 mg total) under the tongue every 5 (five) minutes as needed. (Patient taking differently: Place 0.4 mg under the tongue every 5 (five) minutes as needed for chest pain. ) 25 tablet 3  . Saw Palmetto, Serenoa  repens, 450 MG CAPS Take by mouth. Take 1 capsule in AM and 2 capsules in PM    . simvastatin (ZOCOR) 40 MG tablet Take 0.5 tablets (20 mg total) by mouth daily. 45 tablet 3  . ticagrelor (BRILINTA) 90 MG TABS tablet Take 1 tablet (90 mg total) by mouth 2 (two) times daily. 180 tablet 3   No current facility-administered medications for this visit.    Allergies:  Review of patient's allergies indicates no known allergies.   Social History: The patient  reports that he quit smoking about 15 years ago. His smoking use included Cigarettes. He started smoking about 67 years ago. He has a 25 pack-year smoking history. He has never used smokeless tobacco. He reports that he drinks alcohol. He reports that he does not use illicit drugs.   ROS:  Please see the history of present illness. Otherwise, complete review of systems is positive for none.  All other systems are reviewed and negative.    Physical Exam: VS:  BP 160/67 mmHg  Pulse 57  Ht 5\' 7"  (1.702 m)  Wt 144 lb 6.4 oz (65.499 kg)  BMI 22.61 kg/m2  SpO2 99%, BMI Body mass index is 22.61 kg/(m^2).  Wt Readings from Last 3 Encounters:  07/25/14 144 lb 6.4 oz (65.499 kg)  05/17/14 140 lb (63.504 kg)  04/26/14 136 lb 14.5 oz (62.1 kg)     Patient appears comfortable at rest. HEENT: Conjunctiva and lids normal, oropharynx clear. Neck: Supple, no elevated JVP, soft left carotid bruit, no thyromegaly. Lungs: Clear to auscultation, nonlabored breathing at rest. Cardiac: Regular rate and rhythm, no S3 or significant systolic murmur, no pericardial rub. Abdomen: Soft, nontender, bowel sounds present, no guarding or rebound. Extremities: No pitting edema, distal pulses 2+. Skin: Warm and dry. Musculoskeletal: No kyphosis. Neuropsychiatric: Alert and oriented x3, affect grossly appropriate.   ECG: ECG is not ordered today.   Recent Labwork: 04/22/2014: TSH 2.690 04/25/2014: ALT 53; AST 98* 04/26/2014: BUN 21; Creatinine 0.99;  Hemoglobin 12.7*; Platelets 250; Potassium 4.4; Sodium 142     Component Value Date/Time   CHOL 134 04/23/2014 0057   TRIG 99 04/23/2014 0057   HDL 33* 04/23/2014 0057   CHOLHDL 4.1 04/23/2014 0057   VLDL 20 04/23/2014 0057   LDLCALC 81 04/23/2014 0057    Other Studies Reviewed Today:  1. Echocardiogram 04/23/2014: Study Conclusions  - Left ventricle: E/e&'>14.5 suggestive of elevated LV filling pressures. The cavity size was normal. There was mild focal basal hypertrophy of the septum. Systolic function was normal. The estimated ejection fraction was in the range of 55% to 60%. There is akinesis of the basal-midinferolateral myocardium. - Aortic valve: Mildly calcified leaflets. There was trivial regurgitation. - Mitral valve: There was mild regurgitation. - Atrial septum: There was increased thickness of the septum, consistent with lipomatous hypertrophy. - Pulmonary arteries: PA peak pressure:  44 mm Hg (S).  Impressions:  - The right ventricular systolic pressure was increased consistent with moderate pulmonary hypertension.  2. Carotid Dopplers 04/11/2014: Heterogeneous plaque, bilaterally. Stable 1-39% RICA stenosis. 123456 LICA stenosis which has decreased from prior exam. Patent vertebral arteries with antegrade flow. Normal subclavian arteries, bilaterally.  Assessment and Plan:  1. Multivessel CAD status post CABG and percutaneous graft interventions as outlined above. Plan is to continue current medical therapy including DAPT anticipated at least through November of this year.  2. Essential hypertension, plan to initiate lisinopril at 10 mg daily which is less than his prior dose. Keep follow-up with blood pressure at home. Follow-up BMET in 2 weeks.  Current medicines are reviewed at length with the patient today.  The patient has concerns regarding medicines. We discussed his Brilinta today, also adding back lisinopril.   Orders Placed This  Encounter  Procedures  . Basic metabolic panel    Disposition: FU with me in 6 months.   Signed, Satira Sark, MD, [facility] 07/25/2014 3:46 PM    [facility] [place], [place], [place] [address] Phone: [phone]; Fax: [phone]

## 2014-07-25 NOTE — Patient Instructions (Signed)
Your physician recommends that you schedule a follow-up appointment in: 6 months. You will receive a reminder letter in the mail in about 4 months reminding you to call and schedule your appointment. If you don't receive this letter, please contact our office. Your physician has recommended you make the following change in your medication:  Start lisinopril 10 mg daily. Please break your current supply of 20 mg in half daily. Continue all other medications the same. Your physician recommends that you have lab work in 2 weeks to check your BMET around 08/08/14.

## 2014-08-02 ENCOUNTER — Telehealth: Payer: Self-pay | Admitting: *Deleted

## 2014-08-02 NOTE — Telephone Encounter (Signed)
Per wife, patient has been itching since starting the lisinopril. Nurse advised wife that patient can hold lisinopril to see if his symptoms improve. Wife informed to call office back next week with update.

## 2014-08-16 ENCOUNTER — Telehealth: Payer: Self-pay | Admitting: *Deleted

## 2014-08-16 NOTE — Telephone Encounter (Signed)
Wife informed

## 2014-08-16 NOTE — Telephone Encounter (Signed)
-----   Message from Satira Sark, MD sent at 08/16/2014  1:04 PM EST ----- Results reviewed. Creatinine up to 1.6 which is worse than last check. We did recently try lisinopril for better blood pressure control, would have him stop this medicine for now.

## 2014-08-26 ENCOUNTER — Telehealth: Payer: Self-pay | Admitting: *Deleted

## 2014-08-26 NOTE — Telephone Encounter (Signed)
Wife called to get clarification on imdur dose. Wife called because she refilled an old prescription of imdur 60 mg and wanted to make sure he was on 30 mg. Nurse advised wife that patient is on imdur 30 mg daily and also reviewed all medications during phone call. Wife verbalized understanding of plan. Wife also confirmed that patient's itching improved after stopping lisinopril on 08/02/14.

## 2014-11-06 ENCOUNTER — Encounter: Payer: Self-pay | Admitting: Cardiology

## 2014-12-02 ENCOUNTER — Encounter: Payer: Self-pay | Admitting: *Deleted

## 2014-12-02 ENCOUNTER — Telehealth: Payer: Self-pay | Admitting: Cardiology

## 2014-12-02 MED ORDER — SIMVASTATIN 40 MG PO TABS: 20.0000 mg | ORAL_TABLET | Freq: Every day | ORAL | Status: DC

## 2014-12-02 NOTE — Telephone Encounter (Signed)
This encounter was created in error - please disregard.

## 2014-12-02 NOTE — Telephone Encounter (Signed)
SIMVASTAIN --  20 MG   -HUMA[facility] NEEDS AN UPDATED SCRIPT .Marland Kitchen

## 2015-01-06 ENCOUNTER — Encounter: Payer: Self-pay | Admitting: Cardiology

## 2015-01-06 ENCOUNTER — Ambulatory Visit (INDEPENDENT_AMBULATORY_CARE_PROVIDER_SITE_OTHER): Payer: Commercial Managed Care - HMO | Admitting: Cardiology

## 2015-01-06 VITALS — BP 120/62 | HR 70 | Ht 67.0 in | Wt 137.0 lb

## 2015-01-06 DIAGNOSIS — I25810 Atherosclerosis of coronary artery bypass graft(s) without angina pectoris: Secondary | ICD-10-CM

## 2015-01-06 DIAGNOSIS — I10 Essential (primary) hypertension: Secondary | ICD-10-CM | POA: Diagnosis not present

## 2015-01-06 MED ORDER — TICAGRELOR 90 MG PO TABS: 90.0000 mg | ORAL_TABLET | Freq: Two times a day (BID) | ORAL | Status: AC

## 2015-01-06 NOTE — Progress Notes (Signed)
Cardiology Office Note  Date: 01/06/2015   ID: Billy [name], DOB [birth year], MRN [id number]  PCP: Curlene Labrum, MD  Primary Cardiologist: Rozann Lesches, MD   Chief Complaint  Patient presents with  . Coronary Artery Disease  . Hypertension    History of Present Illness: Billy [name] is a 80 y.o. male last seen in February. He is here with his wife today for a follow-up visit. Reports no angina symptoms, stable dyspnea on exertion which has been relatively mild. He does feel somewhat short of breath when he is seated at other times, seems to think it might be related to allergies or sinus trouble. He has used an over-the-counter antihistamine with improved symptoms.  Cardiac catheterization in November 2015 in the setting of NSTEMI demonstrated occlusion of the SVG to obtuse marginal system which was treated with 2 drug-eluting stents. Otherwise had patent SVG to RCA with severe disease in the body of the graft, patent LIMA to LAD, and patent SVG to diagonal. He underwent staged intervention to the vein graft to the RCA subsequently. He continues on the DAPT through November of this year.  Past Medical History  Diagnosis Date  . Coronary atherosclerosis of native coronary artery     Multivessel status post CABG, DES x 2 SVG to OM with staged DES SVG to RCA November 2015  . Essential hypertension, benign   . Carotid artery disease     123456 LICA and [id number] RICA - December 2013  . Sinus bradycardia     Asymptomatic  . Gout   . HOH (hard of hearing)     Current Outpatient Prescriptions  Medication Sig Dispense Refill  . aspirin EC 81 MG tablet Take 81 mg by mouth daily.      Marland Kitchen COLCRYS 0.6 MG tablet Take 1 tablet by mouth Once daily as needed.    Marland Kitchen ibuprofen (ADVIL,MOTRIN) 200 MG tablet Take 200 mg by mouth every 6 (six) hours as needed for moderate pain.     . isosorbide mononitrate (IMDUR) 30 MG 24 hr tablet Take 1 tablet (30 mg total) by mouth daily. 90 tablet 3  .  metoprolol tartrate (LOPRESSOR) 25 MG tablet Take 1 tablet (25 mg total) by mouth 2 (two) times daily. 180 tablet 3  . nitroGLYCERIN (NITROSTAT) 0.4 MG SL tablet Place 1 tablet (0.4 mg total) under the tongue every 5 (five) minutes as needed. (Patient taking differently: Place 0.4 mg under the tongue every 5 (five) minutes as needed for chest pain. ) 25 tablet 3  . Saw Palmetto, Serenoa repens, 450 MG CAPS Take by mouth. Take 1 capsule in AM and 2 capsules in PM    . simvastatin (ZOCOR) 40 MG tablet Take 0.5 tablets (20 mg total) by mouth daily. 45 tablet 3  . ticagrelor (BRILINTA) 90 MG TABS tablet Take 1 tablet (90 mg total) by mouth 2 (two) times daily. 180 tablet 3   No current facility-administered medications for this visit.    Allergies:  Review of patient's allergies indicates no known allergies.   Social History: The patient  reports that he quit smoking about 15 years ago. His smoking use included Cigarettes. He started smoking about 68 years ago. He has a 25 pack-year smoking history. He has never used smokeless tobacco. He reports that he drinks alcohol. He reports that he does not use illicit drugs.   ROS:  Please see the history of present illness. Otherwise, complete review of systems is positive for decreased  hearing, leg cramps at nighttime.  All other systems are reviewed and negative.   Physical Exam: VS:  BP 120/62 mmHg  Pulse 70  Ht 5\' 7"  (1.702 m)  Wt 137 lb (62.143 kg)  BMI 21.45 kg/m2  SpO2 97%, BMI Body mass index is 21.45 kg/(m^2).  Wt Readings from Last 3 Encounters:  01/06/15 137 lb (62.143 kg)  07/25/14 144 lb 6.4 oz (65.499 kg)  05/17/14 140 lb (63.504 kg)    Patient appears comfortable at rest. HEENT: Conjunctiva and lids normal, oropharynx clear. Neck: Supple, no elevated JVP, soft left carotid bruit, no thyromegaly. Lungs: Clear to auscultation, nonlabored breathing at rest. Cardiac: Regular rate and rhythm, no S3 or significant systolic murmur, no  pericardial rub. Abdomen: Soft, nontender, bowel sounds present, no guarding or rebound. Extremities: No pitting edema, distal pulses 2+. Skin: Warm and dry. Musculoskeletal: No kyphosis. Neuropsychiatric: Alert and oriented x3, affect grossly appropriate.  ECG: ECG is not ordered today.   Recent Labwork:  10/22/2014: BUN 34, creatinine 1.1, potassium 4.8, AST 16, ALT 13 , hemoglobin 11.3, platelets 324  Other Studies Reviewed Today:  Echocardiogram 04/23/2014: Study Conclusions  - Left ventricle: E/e&'>14.5 suggestive of elevated LV filling pressures. The cavity size was normal. There was mild focal basal hypertrophy of the septum. Systolic function was normal. The estimated ejection fraction was in the range of 55% to 60%. There is akinesis of the basal-midinferolateral myocardium. - Aortic valve: Mildly calcified leaflets. There was trivial regurgitation. - Mitral valve: There was mild regurgitation. - Atrial septum: There was increased thickness of the septum, consistent with lipomatous hypertrophy. - Pulmonary arteries: PA peak pressure: 44 mm Hg (S).  Impressions:  - The right ventricular systolic pressure was increased consistent with moderate pulmonary hypertension.  Assessment and Plan:  1. Symptomatically stable CAD status post CABG with DES to the SVG to OM and SVG to RCA in November 2015. Continue DAPT through November of this year.  2. Essential hypertension, blood pressure is normal today.  Current medicines were reviewed with the patient today.  Disposition: FU with me in 6 months.   Signed, Satira Sark, MD, [facility] 01/06/2015 1:26 PM    [facility] [address], [place], [place] [address] Phone: [phone]; Fax: [phone]

## 2015-01-06 NOTE — Patient Instructions (Signed)
Your physician recommends that you continue on your current medications as directed. Please refer to the Current Medication list given to you today. You may stop your brilinta at the end of November 2016. Your physician recommends that you schedule a follow-up appointment in: 6 months. You will receive a reminder letter in the mail in about 4 months reminding you to call and schedule your appointment. If you don't receive this letter, please contact our office.

## 2015-02-05 ENCOUNTER — Encounter: Payer: Self-pay | Admitting: Gastroenterology

## 2015-02-21 ENCOUNTER — Ambulatory Visit: Payer: Commercial Managed Care - HMO | Admitting: Gastroenterology

## 2015-02-27 ENCOUNTER — Encounter: Payer: Self-pay | Admitting: Gastroenterology

## 2015-02-27 ENCOUNTER — Ambulatory Visit (INDEPENDENT_AMBULATORY_CARE_PROVIDER_SITE_OTHER): Payer: Commercial Managed Care - HMO | Admitting: Gastroenterology

## 2015-02-27 VITALS — BP 157/69 | HR 62 | Temp 98.6°F | Ht 67.0 in | Wt 139.0 lb

## 2015-02-27 DIAGNOSIS — K921 Melena: Secondary | ICD-10-CM | POA: Diagnosis not present

## 2015-02-27 DIAGNOSIS — D649 Anemia, unspecified: Secondary | ICD-10-CM | POA: Diagnosis not present

## 2015-02-27 NOTE — Patient Instructions (Signed)
1. Please have your labs done today.  2. We will call you with results and determine next step. Suspect you will need upper endoscopy.  3. If you have worsening weakness, dizziness, shortness of breath, recurrent black stools, go to the nearest ER.  4. Return stool specimen to our office.

## 2015-02-27 NOTE — Progress Notes (Signed)
CC'ED TO PCP 

## 2015-02-27 NOTE — Progress Notes (Signed)
Primary Care Physician:  Curlene Labrum, MD  Primary Gastroenterologist:  Barney Drain, MD   Chief Complaint  Patient presents with  . Anemia    HPI:  Billy [name] is a 80 y.o. male here for further evaluation of progressive anemia. Hgb 11 in June and down to 9.8 last month. Complains of weakness.   Cardiac catheterization November 2015 in the setting of a NSTEMI demonstrated occlusion of the SVC G to obtuse marginal system which was treated with 2 drug-eluting stents. Plans to continue DAPT through November of this year.  Three weeks ago really black stools. Off/on. No brbpr. Three years intermittent left sided abdominal pain vague and goes months at a time without. Lot of burping all day long. No heartburn. No weight loss. No dysphagia.   States he had a colonoscopy with Dr. Britta Mccreedy a couple of years ago. He has had only one colonoscopy in his life. We retrieved record of a colonoscopy from Dr. Valentino Saxon on April 2014 for rectal bleeding and abdominal pain. Found to have colitis in the descending and sigmoid colon. Pathology most consistent with C. difficile. Patient states he was treated with antibiotic therapy. Was not pleased with the care he received thus the reason he did not go back.  Rarely uses Ibuprofen.  Current Outpatient Prescriptions  Medication Sig Dispense Refill  . aspirin EC 81 MG tablet Take 81 mg by mouth daily.      Marland Kitchen COLCRYS 0.6 MG tablet Take 1 tablet by mouth Once daily as needed.    Marland Kitchen ibuprofen (ADVIL,MOTRIN) 200 MG tablet Take 200 mg by mouth every 6 (six) hours as needed for moderate pain.     . isosorbide mononitrate (IMDUR) 30 MG 24 hr tablet Take 1 tablet (30 mg total) by mouth daily. (Patient taking differently: Take 15 mg by mouth daily. ) 90 tablet 3  . metoprolol tartrate (LOPRESSOR) 25 MG tablet Take 1 tablet (25 mg total) by mouth 2 (two) times daily. (Patient taking differently: Take 12.5 mg by mouth 2 (two) times daily. ) 180 tablet 3  .  nitroGLYCERIN (NITROSTAT) 0.4 MG SL tablet Place 1 tablet (0.4 mg total) under the tongue every 5 (five) minutes as needed. (Patient taking differently: Place 0.4 mg under the tongue every 5 (five) minutes as needed for chest pain. ) 25 tablet 3  . Saw Palmetto, Serenoa repens, 450 MG CAPS Take by mouth. Take 1 capsule in AM and 2 capsules in PM    . simvastatin (ZOCOR) 40 MG tablet Take 0.5 tablets (20 mg total) by mouth daily. 45 tablet 3  . ticagrelor (BRILINTA) 90 MG TABS tablet Take 1 tablet (90 mg total) by mouth 2 (two) times daily. 180 tablet 3   No current facility-administered medications for this visit.    Allergies as of 02/27/2015  . (No Known Allergies)    Past Medical History  Diagnosis Date  . Coronary atherosclerosis of native coronary artery     Multivessel status post CABG, DES x 2 SVG to OM with staged DES SVG to RCA November 2015  . Essential hypertension, benign   . Carotid artery disease     123456 LICA and [id number] RICA - December 2013  . Sinus bradycardia     Asymptomatic  . Gout   . HOH (hard of hearing)     Past Surgical History  Procedure Laterality Date  . Coronary artery bypass graft  11/09/1999    LIMA to LAD, SVG to diagonal, SVG to  OM1 and OM2, SVG to PDA  . Appendectomy      Ruptured  . Cataract extraction w/phaco Right 11/13/2012    Procedure: CATARACT EXTRACTION PHACO AND INTRAOCULAR LENS PLACEMENT (IOC);  Surgeon: Tonny Branch, MD;  Location: [facility];  Service: Ophthalmology;  Laterality: Right;  CDE:12.75  . Cataract extraction w/phaco Left 01/11/2013    Procedure: CATARACT EXTRACTION PHACO AND INTRAOCULAR LENS PLACEMENT (IOC);  Surgeon: Tonny Branch, MD;  Location: [facility];  Service: Ophthalmology;  Laterality: Left;  CDE: 21.13  . Left heart catheterization with coronary angiogram N/A 04/22/2014    Procedure: LEFT HEART CATHETERIZATION WITH CORONARY ANGIOGRAM;  Surgeon: Burnell Blanks, MD;  Location: [facility] CATH LAB;  Service: Cardiovascular;   Laterality: N/A;  . Percutaneous coronary stent intervention (pci-s) N/A 04/25/2014    Procedure: PERCUTANEOUS CORONARY STENT INTERVENTION (PCI-S);  Surgeon: Peter M Martinique, MD;  Location: [facility] CATH LAB;  Service: Cardiovascular;  Laterality: N/A;  . Colonoscopy  09/2013    Dr. Burke Keels: colitis descending colon and sigmoid colon.Bx ?Cdiff vs ischemic.    Family History  Problem Relation Age of Onset  . Cancer Father     colon, age 20s    Social History   Social History  . Marital Status: Married    Spouse Name: N/A  . Number of Children: N/A  . Years of Education: N/A   Occupational History  . DISABLED     [employer]   Social History Main Topics  . Smoking status: Former Smoker -- 0.50 packs/day for 50 years    Types: Cigarettes    Start date: 01/08/1947    Quit date: 06/08/1999  . Smokeless tobacco: Never Used  . Alcohol Use: No  . Drug Use: No  . Sexual Activity: Yes    Birth Control/ Protection: None   Other Topics Concern  . Not on file   Social History Narrative   He lives in [place] with his wife.      ROS:  General: Negative for anorexia, weight loss, fever, chills, fatigue, weakness. Eyes: Negative for vision changes.  ENT: Negative for hoarseness, difficulty swallowing , nasal congestion. CV: Negative for chest pain, angina, palpitations, dyspnea on exertion, peripheral edema.  Respiratory: Negative for dyspnea at rest, dyspnea on exertion, cough, sputum, wheezing.  GI: See history of present illness. GU:  Negative for dysuria, hematuria, urinary incontinence, urinary frequency, nocturnal urination.  MS: Negative for joint pain, low back pain.  Derm: Negative for rash or itching.  Neuro: Negative for weakness, abnormal sensation, seizure, frequent headaches, memory loss, confusion.  Psych: Negative for anxiety, depression, suicidal ideation, hallucinations.  Endo: Negative for unusual weight change.  Heme: Negative for bruising or  bleeding. Allergy: Negative for rash or hives.    Physical Examination:  BP 157/69 mmHg  Pulse 62  Temp(Src) 98.6 F (37 C) (Oral)  Ht 5\' 7"  (1.702 m)  Wt 139 lb (63.05 kg)  BMI 21.77 kg/m2   General: Well-nourished, well-developed in no acute distress.  Head: Normocephalic, atraumatic.   Eyes: Conjunctiva pink, no icterus. Mouth: Oropharyngeal mucosa moist and pink , no lesions erythema or exudate. Neck: Supple without thyromegaly, masses, or lymphadenopathy.  Lungs: Clear to auscultation bilaterally.  Heart: Regular rate and rhythm, no murmurs rubs or gallops.  Abdomen: Bowel sounds are normal, nontender, nondistended, no hepatosplenomegaly or masses, no abdominal bruits or    hernia , no rebound or guarding.   Rectal: not performed Extremities: No lower extremity edema. No clubbing  or deformities.  Neuro: Alert and oriented x 4 , grossly normal neurologically.  Skin: Warm and dry, no rash or jaundice.   Psych: Alert and cooperative, normal mood and affect.  Labs: Lastly June 2016 Hemoglobin 11, hematocrit 32.5, MCV 85, platelets 349,000, white blood cell count 5500, amylase 60, TSH 4.15, total bilirubin 0.3, alkaline phosphatase 60, AST 22, ALT 20, albumen 4.3, creatinine 1.18.  Labs from 01/31/2015 creatinine 1.41, BUN 35, total bilirubin 0.4, alkaline phosphatase 57, AST 17.9, ALT 18, albumen 4.2, white blood cell count 7800, hemoglobin 9.8, hematocrit 31.9, MCV 88.6, platelets 3 66,000.  Imaging Studies: No results found.

## 2015-02-27 NOTE — Assessment & Plan Note (Signed)
80 year old gentleman who presents for further evaluation of progressive anemia in the setting of Brilinta. Drug-eluting stents placed November 2015. Reports intermittent melena, last time a few weeks ago. Complains of weakness, belching but otherwise asymptomatic. Colonoscopy in April 2014 for other reasons (rectal bleeding and abdominal plain), had colitis at the time. No prior upper endoscopy. Suspect GI bleeding based on history of melena, elevated BUN. Unfortunately he cannot come off of Brilinta until end November.   Plan on updating CBC along with iron studies. I FOBT. Likely EGD in near future on Brilinta for diagnostic purposes. To discuss with Dr. Oneida Alar. Retrieve additional records from Dr. Britta Mccreedy.

## 2015-03-06 ENCOUNTER — Telehealth: Payer: Self-pay

## 2015-03-06 NOTE — Telephone Encounter (Signed)
Labs from [facility] dated 02/27/15 White blood cell count 5500, hemoglobin 10.5L, hematocrit 33.4L, MCV 84.8, platelets 342,000, ferritin 25L, iron 47L, TIBC 488H, iron saturations 10%L  Hemoglobin on August 26 was 9.8.  Please see if he can return his ifobt as soon as possible.  I will discuss EGD with [facility] now that we have labs back. Patient cannot come off Brilinta until end of November. ?diagnostic only EGD?

## 2015-03-06 NOTE — Telephone Encounter (Signed)
There are no results in EPIC. Please find out where he had his labs and request copy ASAP.  Did he do his ifobt? I have not received records from Dr. Britta Mccreedy either? Can we have those requested again.

## 2015-03-06 NOTE — Telephone Encounter (Signed)
Called and informed pt's wife. She said she mailed the iFOBT on Monday. She is aware we will let her know after Magda Paganini discusses with Dr. Oneida Alar.

## 2015-03-06 NOTE — Telephone Encounter (Signed)
Wife states that the labs were done at [facility]

## 2015-03-06 NOTE — Telephone Encounter (Signed)
Labs given from [facility] to [facility]

## 2015-03-06 NOTE — Telephone Encounter (Signed)
Can we please call and get labs from [facility]?

## 2015-03-06 NOTE — Telephone Encounter (Signed)
Billy [name], can you help with this one.

## 2015-03-06 NOTE — Telephone Encounter (Signed)
Routing back to Neil Crouch, [place].

## 2015-03-06 NOTE — Telephone Encounter (Signed)
Called medical records and  They will fax them over shortly

## 2015-03-06 NOTE — Telephone Encounter (Signed)
Wife called wanting to know the results of the patients lab work. Please advise

## 2015-03-07 ENCOUNTER — Ambulatory Visit (INDEPENDENT_AMBULATORY_CARE_PROVIDER_SITE_OTHER): Payer: Commercial Managed Care - HMO

## 2015-03-07 DIAGNOSIS — D649 Anemia, unspecified: Secondary | ICD-10-CM

## 2015-03-07 LAB — IFOBT (OCCULT BLOOD): IFOBT: NEGATIVE

## 2015-03-07 NOTE — Progress Notes (Signed)
IFOBT is Negative

## 2015-03-07 NOTE — Progress Notes (Signed)
REVIEWED. CALL AND DISCUSS CASE WITH DR. MCDOWELL. Hb 12.11 Apr 2014 Hb RELATIVELY STABLE NOW JUN 2016 Hb 11, AUG 2016 9.8, SEP 2016 10.5 SUSPECT LOW VOLUME BLOOD LOSS. WOULD GIVE PT OPTION OF EGD ON BRILINTA OR IF APPROVED BY CARDIOLOGY EGD OFF BRILINTA.

## 2015-03-07 NOTE — Telephone Encounter (Signed)
SEE OPV ADDENDUM

## 2015-03-17 NOTE — Telephone Encounter (Signed)
Please call Dr. Myles Gip office and ask if patient can come off of Brilinta for EGD. He was stenting 04/2014. He is suspected of low volume blood loss, reports melena.   If he cannot come off of Brilinta then he would have to have diagnostic only EGD or wait until he can come off Brilinta.

## 2015-03-18 NOTE — Telephone Encounter (Signed)
Letter has been faxed to Dr. Domenic Polite to advise about the Brilinta.

## 2015-03-19 ENCOUNTER — Other Ambulatory Visit: Payer: Self-pay

## 2015-03-19 ENCOUNTER — Telehealth: Payer: Self-pay | Admitting: Gastroenterology

## 2015-03-19 DIAGNOSIS — D649 Anemia, unspecified: Secondary | ICD-10-CM

## 2015-03-19 MED ORDER — PANTOPRAZOLE SODIUM 40 MG PO TBEC: 40.0000 mg | DELAYED_RELEASE_TABLET | Freq: Every day | ORAL | Status: DC

## 2015-03-19 NOTE — Telephone Encounter (Signed)
Pt's wife called back today asking about the specific labs again and I went over those. She is aware that iFOBT was negative.  She is also aware we have faxed Dr. Domenic Polite in reference to holding the Brilinta for an EGD. She said pt was supposed to come off anyway at the end of Nov and maybe they could just do it then if it is not an emergency.

## 2015-03-19 NOTE — Telephone Encounter (Signed)
RX for pantoprazole sent to [facility].  Keep look out or reminder to call [facility] for labs in next day or so.

## 2015-03-19 NOTE — Addendum Note (Signed)
Addended by: Mahala Menghini on: 03/19/2015 02:38 PM   Modules accepted: Orders

## 2015-03-19 NOTE — Telephone Encounter (Signed)
I called and informed pt's wife. ( She said he is not on a PPI).  She requested his lab order be faxed to [facility] lab because they have assistance there).  Lab orders faxed.

## 2015-03-19 NOTE — Telephone Encounter (Signed)
On my reminder list for end of November.

## 2015-03-19 NOTE — Telephone Encounter (Signed)
[phone]   PATIENT WIFE CALLED AND WANTS TO KNOW WHY SHE HAS NOT HEARD THE RESULTS FROM HIS LAB AND STOOL SAMPLE TEST YET.  STATES IT HAS BEEN 2 WEEKS.

## 2015-03-19 NOTE — Telephone Encounter (Signed)
See note and addendum to 03/06/2015. Pt was informed of labs from [facility] previously. I did tell her today the iFOBT was negative.

## 2015-03-19 NOTE — Telephone Encounter (Signed)
Billy [name] had a DES intervention to a vein graft in November 2015 in the setting of ACS and has been on aspirin and Brilinta since that time. Ideally, Brilinta would be continued through November of this year, however if EGD needs to be done more urgently, would recommend holding Brilinta for 5 days prior to the procedure. Since he is close to one year out from his prior intervention, it would not be unreasonable to hold the Brilinta at this time.         ----- Message -----     From: Merlene Laughter, LPN     Sent: [id number]  6:58 AM      To: Satira Sark, MD        Please let patient/wife know that we received input back from Dr. Domenic Polite. We are near end of Brilinta and as long as Hgb is stable we should wait to perform EGD off Brilinta towards end of November.   Let's update CBC at this time. (it has not been checked in 3 weeks). Continue PPI. Avoid NSAIDs.

## 2015-03-19 NOTE — Telephone Encounter (Signed)
LMOM the Rx has been sent in and to call with questions. Previously said they would probably go for labs on Friday.

## 2015-03-24 NOTE — Telephone Encounter (Signed)
Spoke to sharon at [employer] and she is to fax them

## 2015-03-24 NOTE — Telephone Encounter (Signed)
GAVE LABS TO LSL

## 2015-03-24 NOTE — Telephone Encounter (Signed)
Please let patient and wife know that his labs showed Hgb 10.8, wbc 5500, Platelets 275,000. Hemoglobin has been stable to slightly improved.   Let's try to wait until end of November for EGD so we can do off Brilinta.  Please schedule return OV first of November to get scheduled for EGD.  Repeat CBC at [facility] two days prior to OV.

## 2015-03-24 NOTE — Telephone Encounter (Signed)
Can we see if labs are available from [facility]?

## 2015-03-25 ENCOUNTER — Other Ambulatory Visit: Payer: Self-pay

## 2015-03-25 ENCOUNTER — Telehealth: Payer: Self-pay | Admitting: Gastroenterology

## 2015-03-25 ENCOUNTER — Encounter: Payer: Self-pay | Admitting: Gastroenterology

## 2015-03-25 DIAGNOSIS — D649 Anemia, unspecified: Secondary | ICD-10-CM

## 2015-03-25 NOTE — Telephone Encounter (Signed)
Pt is scheduled an OV for 11/7 at 10 with LSL and needs CBC done at [facility] 2 days prior to OV

## 2015-03-25 NOTE — Telephone Encounter (Signed)
Pt's wife is aware. I am putting the order for the blood work in the mail for her to hold on to and have done about 04/10/2015, prior to OV appt.

## 2015-03-25 NOTE — Telephone Encounter (Signed)
PT is aware and lab order has been mailed to him.

## 2015-03-25 NOTE — Telephone Encounter (Signed)
OV made for 11/7 at 10 with LSL

## 2015-03-26 NOTE — Telephone Encounter (Signed)
REVIEWED. AGREE. NO ADDITIONAL RECOMMENDATIONS. 

## 2015-04-01 ENCOUNTER — Telehealth: Payer: Self-pay

## 2015-04-01 NOTE — Telephone Encounter (Signed)
Called pt and LMOM.  

## 2015-04-01 NOTE — Telephone Encounter (Signed)
Agree with stopping protonix. May utilize benadryl for rash and itching per package instructions. If no improvement, he needs to see his PCP.  Keep OV as scheduled for 04/14/15 with [place].

## 2015-04-01 NOTE — Telephone Encounter (Signed)
Pt wife called and states that the pt has been taking protonix for about 2 weeks and has now developed a rash. I advised wife to instruct pt not to take it any further. Pt may take Benadryl for rash and itching.  Routing to LSL for advise

## 2015-04-07 ENCOUNTER — Telehealth: Payer: Self-pay | Admitting: Cardiology

## 2015-04-07 MED ORDER — METOPROLOL TARTRATE 25 MG PO TABS: 25.0000 mg | ORAL_TABLET | Freq: Two times a day (BID) | ORAL | Status: DC

## 2015-04-07 NOTE — Telephone Encounter (Signed)
Need to clarify dosage on metoprolol tartrate.

## 2015-04-07 NOTE — Telephone Encounter (Signed)
Wife will call office when she gets back home after lunch to verify directions for lopressor.

## 2015-04-07 NOTE — Telephone Encounter (Signed)
Wife called back to confirm that patient is taking lopressor 25 mg twice daily.

## 2015-04-07 NOTE — Telephone Encounter (Signed)
°  STAT if patient is at the pharmacy , call can be transferred to refill team.   1. Which medications need to be refilled? Lopressor 25 mg  2. Which pharmacy/location is medication to be sent to? [place]  3. Do they need a 30 day or 90 day supply? [place]

## 2015-04-14 ENCOUNTER — Encounter: Payer: Self-pay | Admitting: Gastroenterology

## 2015-04-14 ENCOUNTER — Telehealth: Payer: Self-pay

## 2015-04-14 ENCOUNTER — Ambulatory Visit (INDEPENDENT_AMBULATORY_CARE_PROVIDER_SITE_OTHER): Payer: Commercial Managed Care - HMO | Admitting: Gastroenterology

## 2015-04-14 VITALS — BP 155/68 | HR 56 | Temp 97.0°F | Ht 67.0 in | Wt 143.0 lb

## 2015-04-14 DIAGNOSIS — R109 Unspecified abdominal pain: Secondary | ICD-10-CM | POA: Insufficient documentation

## 2015-04-14 DIAGNOSIS — K921 Melena: Secondary | ICD-10-CM

## 2015-04-14 DIAGNOSIS — D649 Anemia, unspecified: Secondary | ICD-10-CM | POA: Diagnosis not present

## 2015-04-14 DIAGNOSIS — R1013 Epigastric pain: Secondary | ICD-10-CM | POA: Insufficient documentation

## 2015-04-14 NOTE — Patient Instructions (Signed)
1. Call me back today and let me know how many more days you have left on Brilinta.

## 2015-04-14 NOTE — Telephone Encounter (Signed)
Wife called [name]) and states that pt has enough Brilenta til end of this month. Wanted to make LSL aware

## 2015-04-14 NOTE — Progress Notes (Signed)
Primary Care Physician:  Curlene Labrum, MD  Primary Gastroenterologist:  Barney Drain, MD   Chief Complaint  Patient presents with  . Follow-up    HPI:  Billy [name] is a 80 y.o. male here to schedule upper endoscopy for further evaluation of a progressive anemia in the setting of Brilinta. Patient was seen initially back in September. Hemoglobin had been 11 back in June and down to 9.8 in August. He had a cardiac catheterization November 2015 in the setting of a NSTEMI which demonstrated occlusion of the SVC G to obtuse marginal system which was treated with 2 drug-eluting stents. Plans to maintain Brilinta through November of this year.   In August and September he had 3 weeks h/o really black stools intermittently. Saw some recently but now brown this morning. No Pepto. He was heme negative X 1. His Hgb has been in 10 range and stable over past few weeks. He complains of epigastric pain and pain in left mid abdomen. C/o 3 year h/o intermittent left sided abd pain vague. Not worse with meals all the time. Sometimes worse with "running" or exertion. Has a lot of belching but no heartburn or dysphagia. No weight loss. Wonders if his pain is gb related.   States he had a colonoscopy with Dr. Britta Mccreedy a couple of years ago. He has had only one colonoscopy in his life. We retrieved record of a colonoscopy from Dr. Valentino Saxon on April 2014 for rectal bleeding and abdominal pain. Found to have colitis in the descending and sigmoid colon. Pathology most consistent with C. difficile. Patient states he was treated with antibiotic therapy. Was not pleased with the care he received thus the reason he did not go back.  Current Outpatient Prescriptions  Medication Sig Dispense Refill  . aspirin EC 81 MG tablet Take 81 mg by mouth daily.      Marland Kitchen COLCRYS 0.6 MG tablet Take 1 tablet by mouth Once daily as needed.    Marland Kitchen ibuprofen (ADVIL,MOTRIN) 200 MG tablet Take 200 mg by mouth every 6 (six) hours as needed  for moderate pain.     . metoprolol tartrate (LOPRESSOR) 25 MG tablet Take 1 tablet (25 mg total) by mouth 2 (two) times daily. 180 tablet 3  . pantoprazole (PROTONIX) 40 MG tablet Take 1 tablet (40 mg total) by mouth daily before breakfast. 30 tablet 3  . Saw Palmetto, Serenoa repens, 450 MG CAPS Take by mouth. Take 1 capsule in AM and 2 capsules in PM    . simvastatin (ZOCOR) 40 MG tablet Take 0.5 tablets (20 mg total) by mouth daily. 45 tablet 3  . ticagrelor (BRILINTA) 90 MG TABS tablet Take 1 tablet (90 mg total) by mouth 2 (two) times daily. 180 tablet 3  . nitroGLYCERIN (NITROSTAT) 0.4 MG SL tablet Place 1 tablet (0.4 mg total) under the tongue every 5 (five) minutes as needed. (Patient not taking: Reported on 04/14/2015) 25 tablet 3   No current facility-administered medications for this visit.    Allergies as of 04/14/2015  . (No Known Allergies)    Past Medical History  Diagnosis Date  . Coronary atherosclerosis of native coronary artery     Multivessel status post CABG, DES x 2 SVG to OM with staged DES SVG to RCA November 2015  . Essential hypertension, benign   . Carotid artery disease ([place])     123456 LICA and [id number] RICA - December 2013  . Sinus bradycardia     Asymptomatic  .  Gout   . HOH (hard of hearing)     Past Surgical History  Procedure Laterality Date  . Coronary artery bypass graft  11/09/1999    LIMA to LAD, SVG to diagonal, SVG to OM1 and OM2, SVG to PDA  . Appendectomy      Ruptured  . Cataract extraction w/phaco Right 11/13/2012    Procedure: CATARACT EXTRACTION PHACO AND INTRAOCULAR LENS PLACEMENT (IOC);  Surgeon: Tonny Branch, MD;  Location: [facility];  Service: Ophthalmology;  Laterality: Right;  CDE:12.75  . Cataract extraction w/phaco Left 01/11/2013    Procedure: CATARACT EXTRACTION PHACO AND INTRAOCULAR LENS PLACEMENT (IOC);  Surgeon: Tonny Branch, MD;  Location: [facility];  Service: Ophthalmology;  Laterality: Left;  CDE: 21.13  . Left heart catheterization  with coronary angiogram N/A 04/22/2014    Procedure: LEFT HEART CATHETERIZATION WITH CORONARY ANGIOGRAM;  Surgeon: Burnell Blanks, MD;  Location: [facility] CATH LAB;  Service: Cardiovascular;  Laterality: N/A;  . Percutaneous coronary stent intervention (pci-s) N/A 04/25/2014    Procedure: PERCUTANEOUS CORONARY STENT INTERVENTION (PCI-S);  Surgeon: Peter M Martinique, MD;  Location: [facility] CATH LAB;  Service: Cardiovascular;  Laterality: N/A;  . Colonoscopy  09/2013    Dr. Burke Keels: colitis descending colon and sigmoid colon.Bx ?Cdiff vs ischemic.    Family History  Problem Relation Age of Onset  . Cancer Father     colon, age 34s    Social History   Social History  . Marital Status: Married    Spouse Name: N/A  . Number of Children: N/A  . Years of Education: N/A   Occupational History  . DISABLED     [employer]   Social History Main Topics  . Smoking status: Former Smoker -- 0.50 packs/day for 50 years    Types: Cigarettes    Start date: 01/08/1947    Quit date: 06/08/1999  . Smokeless tobacco: Never Used  . Alcohol Use: No  . Drug Use: No  . Sexual Activity: Yes    Birth Control/ Protection: None   Other Topics Concern  . Not on file   Social History Narrative   He lives in [place] with his wife.      ROS:  General: Negative for anorexia, weight loss, fever, chills, fatigue, weakness. Eyes: Negative for vision changes.  ENT: Negative for hoarseness, difficulty swallowing , nasal congestion. CV: Negative for chest pain, angina, palpitations, dyspnea on exertion, peripheral edema.  Respiratory: Negative for dyspnea at rest, dyspnea on exertion, cough, sputum, wheezing.  GI: See history of present illness. GU:  Negative for dysuria, hematuria, urinary incontinence, urinary frequency, nocturnal urination.  MS: Negative for joint pain, low back pain.  Derm: Negative for rash or itching.  Neuro: Negative for weakness, abnormal sensation, seizure,  frequent headaches, memory loss, confusion.  Psych: Negative for anxiety, depression, suicidal ideation, hallucinations.  Endo: Negative for unusual weight change.  Heme: Negative for bruising or bleeding. Allergy: Negative for rash or hives.    Physical Examination:  BP 155/68 mmHg  Pulse 56  Temp(Src) 97 F (36.1 C)  Ht 5\' 7"  (1.702 m)  Wt 143 lb (64.864 kg)  BMI 22.39 kg/m2   General: Well-nourished, well-developed in no acute distress.  Head: Normocephalic, atraumatic.   Eyes: Conjunctiva pink, no icterus. Mouth: Oropharyngeal mucosa moist and pink , no lesions erythema or exudate. Neck: Supple without thyromegaly, masses, or lymphadenopathy.  Lungs: Clear to auscultation bilaterally.  Heart: Regular rate and rhythm, no murmurs rubs or gallops.  Abdomen: Bowel sounds are normal, mild epig and left-mid abd tenderness, nondistended, no hepatosplenomegaly or masses, no abdominal bruits or    hernia , no rebound or guarding.   Rectal: not performed Extremities: No lower extremity edema. No clubbing or deformities.  Neuro: Alert and oriented x 4 , grossly normal neurologically.  Skin: Warm and dry, no rash or jaundice.   Psych: Alert and cooperative, normal mood and affect.  Labs: 04/09/2015 white blood cell count 6700, hemoglobin 10.3, hematocrit 33.4, MCV 85.4, platelets 343,000  Imaging Studies: No results found.

## 2015-04-15 NOTE — Assessment & Plan Note (Signed)
80 year old gentleman with history of progressive anemia in the setting of Brilinta. Hemoglobin has been stable since August. He completes Brilinta at the end of the month. Continues to report intermittent melena. Upper GI complaints of belching, epigastric pain. Several year history of left-sided abdominal pain sometimes worse with meals but also with exertion. No change in bowel habits.  Plan on upper endoscopy in the near future. Patient will need to be off of Brilinta at least 5 days prior to the procedure. He called back in since chest her day standing that he had enough Brilinta to last through the end of November. Therefore we will plan on endoscopy December 6 or afterwards.  I have discussed the risks, alternatives, benefits with regards to but not limited to the risk of reaction to medication, bleeding, infection, perforation and the patient is agreeable to proceed. Written consent to be obtained.  Based on findings, he may need to have further workup of his abdominal pain. MRI Abd and CT abd one year ago at [facility] (to be scanned into EPIC) no obvious source for pain.

## 2015-04-15 NOTE — Telephone Encounter (Signed)
Called pt LMOM to call office back 

## 2015-04-15 NOTE — Telephone Encounter (Signed)
Please schedule EGD with [facility] on 05/13/15 or after for melena, anemia, epigastric pain/left sided abd pain.   He should be done with Brilinta on 05/07/15. He needs to make sure he is off at least five days before his procedure. If his doctors start him on any new medications ie blood thinners, or something to take place of Brilinta he needs to let us know ASAP.

## 2015-04-16 NOTE — Progress Notes (Signed)
Agree with Brilinta being out of his system prior to EGD.

## 2015-04-16 NOTE — Progress Notes (Signed)
cc'ed to pcp °

## 2015-04-17 ENCOUNTER — Telehealth: Payer: Self-pay

## 2015-04-17 ENCOUNTER — Other Ambulatory Visit: Payer: Self-pay

## 2015-04-17 DIAGNOSIS — D649 Anemia, unspecified: Secondary | ICD-10-CM

## 2015-04-17 DIAGNOSIS — R109 Unspecified abdominal pain: Secondary | ICD-10-CM

## 2015-04-17 DIAGNOSIS — K921 Melena: Secondary | ICD-10-CM

## 2015-04-17 DIAGNOSIS — R1013 Epigastric pain: Secondary | ICD-10-CM

## 2015-04-17 NOTE — Telephone Encounter (Signed)
Called LMOM to call office back

## 2015-04-17 NOTE — Telephone Encounter (Signed)
See other phone note

## 2015-04-17 NOTE — Telephone Encounter (Signed)
Spoke with pts wife [name]) and pt is set for EGD on 05/23/2015@ 1:15pm.  Wife is aware of pt being of Blood thinners 5 days prior and is to notify us if the doctor prescribes anything new prior to procedure.  Mailed instructions

## 2015-04-17 NOTE — Telephone Encounter (Signed)
Pt's wife was returning a call from [facility]. Please call [phone]

## 2015-05-18 ENCOUNTER — Inpatient Hospital Stay (HOSPITAL_COMMUNITY)
Admission: EM | Admit: 2015-05-18 | Discharge: 2015-05-20 | DRG: 246 | Disposition: A | Discharge status: Home / Self Care | Payer: Commercial Managed Care - HMO | Attending: Cardiology | Admitting: Cardiology

## 2015-05-18 ENCOUNTER — Emergency Department (HOSPITAL_COMMUNITY): Payer: Commercial Managed Care - HMO

## 2015-05-18 ENCOUNTER — Encounter (HOSPITAL_COMMUNITY): Payer: Self-pay | Admitting: Emergency Medicine

## 2015-05-18 DIAGNOSIS — H9190 Unspecified hearing loss, unspecified ear: Secondary | ICD-10-CM | POA: Diagnosis present

## 2015-05-18 DIAGNOSIS — N179 Acute kidney failure, unspecified: Secondary | ICD-10-CM | POA: Diagnosis present

## 2015-05-18 DIAGNOSIS — I2089 Other forms of angina pectoris: Secondary | ICD-10-CM | POA: Diagnosis present

## 2015-05-18 DIAGNOSIS — D649 Anemia, unspecified: Secondary | ICD-10-CM | POA: Diagnosis present

## 2015-05-18 DIAGNOSIS — I10 Essential (primary) hypertension: Secondary | ICD-10-CM | POA: Diagnosis present

## 2015-05-18 DIAGNOSIS — Z955 Presence of coronary angioplasty implant and graft: Secondary | ICD-10-CM

## 2015-05-18 DIAGNOSIS — I129 Hypertensive chronic kidney disease with stage 1 through stage 4 chronic kidney disease, or unspecified chronic kidney disease: Secondary | ICD-10-CM | POA: Diagnosis present

## 2015-05-18 DIAGNOSIS — I25710 Atherosclerosis of autologous vein coronary artery bypass graft(s) with unstable angina pectoris: Secondary | ICD-10-CM | POA: Diagnosis not present

## 2015-05-18 DIAGNOSIS — T82858A Stenosis of vascular prosthetic devices, implants and grafts, initial encounter: Principal | ICD-10-CM | POA: Diagnosis present

## 2015-05-18 DIAGNOSIS — Y838 Other surgical procedures as the cause of abnormal reaction of the patient, or of later complication, without mention of misadventure at the time of the procedure: Secondary | ICD-10-CM | POA: Diagnosis present

## 2015-05-18 DIAGNOSIS — I200 Unstable angina: Secondary | ICD-10-CM | POA: Diagnosis present

## 2015-05-18 DIAGNOSIS — M109 Gout, unspecified: Secondary | ICD-10-CM | POA: Diagnosis present

## 2015-05-18 DIAGNOSIS — N183 Chronic kidney disease, stage 3 unspecified: Secondary | ICD-10-CM | POA: Diagnosis present

## 2015-05-18 DIAGNOSIS — R0789 Other chest pain: Secondary | ICD-10-CM | POA: Diagnosis present

## 2015-05-18 DIAGNOSIS — Z79899 Other long term (current) drug therapy: Secondary | ICD-10-CM | POA: Diagnosis not present

## 2015-05-18 DIAGNOSIS — I25110 Atherosclerotic heart disease of native coronary artery with unstable angina pectoris: Secondary | ICD-10-CM | POA: Diagnosis present

## 2015-05-18 DIAGNOSIS — Z7982 Long term (current) use of aspirin: Secondary | ICD-10-CM

## 2015-05-18 DIAGNOSIS — Z87891 Personal history of nicotine dependence: Secondary | ICD-10-CM | POA: Diagnosis not present

## 2015-05-18 DIAGNOSIS — I209 Angina pectoris, unspecified: Secondary | ICD-10-CM

## 2015-05-18 DIAGNOSIS — I214 Non-ST elevation (NSTEMI) myocardial infarction: Secondary | ICD-10-CM | POA: Insufficient documentation

## 2015-05-18 DIAGNOSIS — I208 Other forms of angina pectoris: Secondary | ICD-10-CM | POA: Diagnosis not present

## 2015-05-18 DIAGNOSIS — D500 Iron deficiency anemia secondary to blood loss (chronic): Secondary | ICD-10-CM | POA: Diagnosis not present

## 2015-05-18 LAB — CBC
HCT: 33.4 % — ABNORMAL LOW (ref 39.0–52.0)
HEMOGLOBIN: 10.2 g/dL — AB (ref 13.0–17.0)
MCH: 25.7 pg — ABNORMAL LOW (ref 26.0–34.0)
MCHC: 30.5 g/dL (ref 30.0–36.0)
MCV: 84.1 fL (ref 78.0–100.0)
PLATELETS: 361 10*3/uL (ref 150–400)
RBC: 3.97 MIL/uL — AB (ref 4.22–5.81)
RDW: 13.7 % (ref 11.5–15.5)
WBC: 6.6 10*3/uL (ref 4.0–10.5)

## 2015-05-18 LAB — TROPONIN I: TROPONIN I: 0.23 ng/mL — AB (ref <0.031)

## 2015-05-18 LAB — BASIC METABOLIC PANEL
ANION GAP: 5 (ref 5–15)
BUN: 28 mg/dL — ABNORMAL HIGH (ref 6–20)
CALCIUM: 9.4 mg/dL (ref 8.9–10.3)
CHLORIDE: 108 mmol/L (ref 101–111)
CO2: 27 mmol/L (ref 22–32)
CREATININE: 1.72 mg/dL — AB (ref 0.61–1.24)
GFR calc non Af Amer: 36 mL/min — ABNORMAL LOW (ref >60)
GFR, EST AFRICAN AMERICAN: 42 mL/min — AB (ref >60)
Glucose, Bld: 133 mg/dL — ABNORMAL HIGH (ref 65–99)
Potassium: 4.8 mmol/L (ref 3.5–5.1)
SODIUM: 140 mmol/L (ref 135–145)

## 2015-05-18 MED ORDER — NITROGLYCERIN 0.4 MG SL SUBL: 0.4000 mg | SUBLINGUAL_TABLET | SUBLINGUAL | Status: DC | PRN

## 2015-05-18 MED ORDER — ENOXAPARIN SODIUM 40 MG/0.4ML ~~LOC~~ SOLN: 40.0000 mg | SUBCUTANEOUS | Status: DC

## 2015-05-18 MED ORDER — SIMVASTATIN 20 MG PO TABS
20.0000 mg | ORAL_TABLET | Freq: Every day | ORAL | Status: DC
  Administered 2015-05-18 – 2015-05-20 (×3): 20 mg via ORAL
  Filled 2015-05-18 (×3): qty 1

## 2015-05-18 MED ORDER — ACETAMINOPHEN 325 MG PO TABS: 650.0000 mg | ORAL_TABLET | ORAL | Status: DC | PRN

## 2015-05-18 MED ORDER — PANTOPRAZOLE SODIUM 40 MG PO TBEC: 40.0000 mg | DELAYED_RELEASE_TABLET | Freq: Every day | ORAL | Status: DC

## 2015-05-18 MED ORDER — HEPARIN (PORCINE) IN NACL 100-0.45 UNIT/ML-% IJ SOLN
1000.0000 [IU]/h | INTRAMUSCULAR | Status: DC
  Administered 2015-05-18: 1000 [IU]/h via INTRAVENOUS
  Filled 2015-05-18: qty 250

## 2015-05-18 MED ORDER — SODIUM CHLORIDE 0.9 % IV SOLN
INTRAVENOUS | Status: DC
  Administered 2015-05-18 – 2015-05-19 (×2): via INTRAVENOUS

## 2015-05-18 MED ORDER — ASPIRIN 81 MG PO CHEW
81.0000 mg | CHEWABLE_TABLET | ORAL | Status: AC
  Administered 2015-05-19: 81 mg via ORAL
  Filled 2015-05-18: qty 1

## 2015-05-18 MED ORDER — METOPROLOL TARTRATE 25 MG PO TABS
25.0000 mg | ORAL_TABLET | Freq: Two times a day (BID) | ORAL | Status: DC
  Administered 2015-05-18 – 2015-05-20 (×4): 25 mg via ORAL
  Filled 2015-05-18 (×4): qty 1

## 2015-05-18 MED ORDER — TICAGRELOR 90 MG PO TABS
90.0000 mg | ORAL_TABLET | Freq: Once | ORAL | Status: AC
  Administered 2015-05-19: 90 mg via ORAL
  Filled 2015-05-18: qty 1

## 2015-05-18 MED ORDER — ISOSORBIDE MONONITRATE ER 30 MG PO TB24
30.0000 mg | ORAL_TABLET | Freq: Every day | ORAL | Status: DC
  Administered 2015-05-19 – 2015-05-20 (×2): 30 mg via ORAL
  Filled 2015-05-18 (×2): qty 1

## 2015-05-18 MED ORDER — HYDRALAZINE HCL 20 MG/ML IJ SOLN: 10.0000 mg | INTRAMUSCULAR | Status: DC | PRN

## 2015-05-18 MED ORDER — HEPARIN BOLUS VIA INFUSION
3000.0000 [IU] | Freq: Once | INTRAVENOUS | Status: AC
  Administered 2015-05-18: 3000 [IU] via INTRAVENOUS

## 2015-05-18 MED ORDER — ASPIRIN EC 81 MG PO TBEC
81.0000 mg | DELAYED_RELEASE_TABLET | Freq: Every day | ORAL | Status: DC
  Administered 2015-05-19 – 2015-05-20 (×2): 81 mg via ORAL
  Filled 2015-05-18 (×2): qty 1

## 2015-05-18 MED ORDER — ONDANSETRON HCL 4 MG/2ML IJ SOLN: 4.0000 mg | Freq: Four times a day (QID) | INTRAMUSCULAR | Status: DC | PRN

## 2015-05-18 MED ORDER — ENOXAPARIN SODIUM 30 MG/0.3ML ~~LOC~~ SOLN: 30.0000 mg | SUBCUTANEOUS | Status: DC

## 2015-05-18 NOTE — ED Notes (Signed)
PT states intermittent central chest pain that radiates down bilateral arms x7 days and had a episode of chest pain with SOB today relieved by nitro x2. PT states he had one 81mg  aspirin today. PT denies any pain at this time.

## 2015-05-18 NOTE — ED Notes (Signed)
[facility] at bedside 

## 2015-05-18 NOTE — ED Notes (Signed)
Attempted to call report to 3W. RN to call back.

## 2015-05-18 NOTE — ED Provider Notes (Signed)
CSN: [id number]     Arrival date & time 05/18/15  1417 History   First MD Initiated Contact with Patient 05/18/15 1501     Chief Complaint  Patient presents with  . Chest Pain     (Consider location/radiation/quality/duration/timing/severity/associated sxs/prior Treatment) Patient is a 80 y.o. male presenting with chest pain.  Chest Pain Pain location:  Substernal area Pain quality: aching and pressure   Pain radiates to:  L shoulder, R shoulder, L arm and R arm Pain radiates to the back: no   Pain severity:  Mild Onset quality:  Sudden Timing:  Intermittent Chronicity:  Recurrent Context: movement   Relieved by:  Nitroglycerin Associated symptoms: shortness of breath   Associated symptoms: no abdominal pain, no cough, no fatigue and no fever   Risk factors: coronary artery disease     Past Medical History  Diagnosis Date  . Coronary atherosclerosis of native coronary artery     Multivessel status post CABG, DES x 2 SVG to OM with staged DES SVG to RCA November 2015  . Essential hypertension, benign   . Carotid artery disease ([place])     123456 LICA and [id number] RICA - December 2013  . Sinus bradycardia     Asymptomatic  . Gout   . HOH (hard of hearing)    Past Surgical History  Procedure Laterality Date  . Coronary artery bypass graft  11/09/1999    LIMA to LAD, SVG to diagonal, SVG to OM1 and OM2, SVG to PDA  . Appendectomy      Ruptured  . Cataract extraction w/phaco Right 11/13/2012    Procedure: CATARACT EXTRACTION PHACO AND INTRAOCULAR LENS PLACEMENT (IOC);  Surgeon: Tonny Branch, MD;  Location: [facility];  Service: Ophthalmology;  Laterality: Right;  CDE:12.75  . Cataract extraction w/phaco Left 01/11/2013    Procedure: CATARACT EXTRACTION PHACO AND INTRAOCULAR LENS PLACEMENT (IOC);  Surgeon: Tonny Branch, MD;  Location: [facility];  Service: Ophthalmology;  Laterality: Left;  CDE: 21.13  . Left heart catheterization with coronary angiogram N/A 04/22/2014    Procedure: LEFT HEART  CATHETERIZATION WITH CORONARY ANGIOGRAM;  Surgeon: Burnell Blanks, MD;  Location: [facility] CATH LAB;  Service: Cardiovascular;  Laterality: N/A;  . Percutaneous coronary stent intervention (pci-s) N/A 04/25/2014    Procedure: PERCUTANEOUS CORONARY STENT INTERVENTION (PCI-S);  Surgeon: Peter M Martinique, MD;  Location: [facility] CATH LAB;  Service: Cardiovascular;  Laterality: N/A;  . Colonoscopy  09/2013    Dr. Burke Keels: colitis descending colon and sigmoid colon.Bx ?Cdiff vs ischemic.   Family History  Problem Relation Age of Onset  . Cancer Father     colon, age 1s   Social History  Substance Use Topics  . Smoking status: Former Smoker -- 0.50 packs/day for 50 years    Types: Cigarettes    Start date: 01/08/1947    Quit date: 06/08/1999  . Smokeless tobacco: Never Used  . Alcohol Use: No    Review of Systems  Constitutional: Negative for fever and fatigue.  Eyes: Negative for pain.  Respiratory: Positive for shortness of breath. Negative for cough and choking.   Cardiovascular: Positive for chest pain.  Gastrointestinal: Negative for abdominal pain.  Endocrine: Negative for polydipsia and polyuria.  Genitourinary: Negative for flank pain and penile pain.  All other systems reviewed and are negative.     Allergies  Review of patient's allergies indicates no known allergies.  Home Medications   Prior to Admission medications   Medication Sig Start Date End Date  Taking? Authorizing Provider  aspirin EC 81 MG tablet Take 81 mg by mouth daily.      Historical Provider, MD  COLCRYS 0.6 MG tablet Take 1 tablet by mouth Once daily as needed. 03/05/12   Historical Provider, MD  ibuprofen (ADVIL,MOTRIN) 200 MG tablet Take 200 mg by mouth every 6 (six) hours as needed for moderate pain.     Historical Provider, MD  metoprolol tartrate (LOPRESSOR) 25 MG tablet Take 1 tablet (25 mg total) by mouth 2 (two) times daily. 04/07/15   Satira Sark, MD  nitroGLYCERIN (NITROSTAT) 0.4 MG SL  tablet Place 1 tablet (0.4 mg total) under the tongue every 5 (five) minutes as needed. Patient not taking: Reported on 04/14/2015 06/15/13   Satira Sark, MD  pantoprazole (PROTONIX) 40 MG tablet Take 1 tablet (40 mg total) by mouth daily before breakfast. 03/19/15   Mahala Menghini, PA-C  Saw Palmetto, Serenoa repens, 450 MG CAPS Take by mouth. Take 1 capsule in AM and 2 capsules in PM    Historical Provider, MD  simvastatin (ZOCOR) 40 MG tablet Take 0.5 tablets (20 mg total) by mouth daily. 12/02/14   Satira Sark, MD   BP 173/67 mmHg  Pulse 67  Temp(Src) 97.7 F (36.5 C) (Oral)  Resp 18  Ht 5' 7.5" (1.715 m)  Wt 142 lb (64.411 kg)  BMI 21.90 kg/m2  SpO2 100% Physical Exam  Constitutional: He is oriented to person, place, and time. He appears well-developed and well-nourished.  HENT:  Head: Normocephalic and atraumatic.  Neck: Normal range of motion.  Cardiovascular: Normal rate.   Pulmonary/Chest: Effort normal and breath sounds normal. No respiratory distress.  Abdominal: Soft. Bowel sounds are normal. He exhibits no distension.  Musculoskeletal: Normal range of motion. He exhibits no edema or tenderness.  Neurological: He is alert and oriented to person, place, and time. No cranial nerve deficit. Coordination normal.  Nursing note and vitals reviewed.   ED Course  Procedures (including critical care time)  CRITICAL CARE Performed by: Merrily Pew   Total critical care time: 35 minutes  Critical care time was exclusive of separately billable procedures and treating other patients.  Critical care was necessary to treat or prevent imminent or life-threatening deterioration.  Critical care was time spent personally by me on the following activities: development of treatment plan with patient and/or surrogate as well as nursing, discussions with consultants, evaluation of patient's response to treatment, examination of patient, obtaining history from patient or  surrogate, ordering and performing treatments and interventions, ordering and review of laboratory studies, ordering and review of radiographic studies, pulse oximetry and re-evaluation of patient's condition.   Labs Review Labs Reviewed  BASIC METABOLIC PANEL - Abnormal; Notable for the following:    Glucose, Bld 133 (*)    BUN 28 (*)    Creatinine, Ser 1.72 (*)    GFR calc non Af Amer 36 (*)    GFR calc Af Amer 42 (*)    All other components within normal limits  CBC - Abnormal; Notable for the following:    RBC 3.97 (*)    Hemoglobin 10.2 (*)    HCT 33.4 (*)    MCH 25.7 (*)    All other components within normal limits  TROPONIN I    Imaging Review No results found. I have personally reviewed and evaluated these images and lab results as part of my medical decision-making.   EKG Interpretation   Date/Time:  Sunday May 18 2015 14:30:25 EST Ventricular Rate:  64 PR Interval:  188 QRS Duration: 94 QT Interval:  424 QTC Calculation: 437 R Axis:   41 Text Interpretation:  Sinus rhythm Borderline T abnormalities, lateral  leads No significant change since last tracing Confirmed by [facility] MD,  Corene Cornea [phone]) on 05/18/2015 3:10:07 PM      MDM   Final diagnoses:  NSTEMI (non-ST elevated myocardial infarction) ([place])  Stented coronary artery   H/o CAD s/p CABGX5 15 years ago, stents placed last year with relatively typical symptoms similar to previous ACS, starts with exertion, improves with rest. Happened again today, didn't improve with rest but did after NTGx2.  Sob is chronic, not worsened but happens quickly with exertion.  Exam as above concrn for unstable angina. Will await initial labs and discuss with cardiology about management (i.e. Admit to cards at [facility], or keep here for rule out).  Initial trop negative, hemoglobin slightly low but no e/o bleeding currently. Started on Heparin for UA, d/w cardiology and will admit to [facility] for cath in AM.     Merrily Pew, MD 05/20/15 1216

## 2015-05-18 NOTE — H&P (Addendum)
Cardiologist: Satira Sark, MD -- last visit 01/06/2015  CC: Chest pain   HPI: 36y yo man with CAD, h/o 5V CABG in 2001, possible posterior STEMI in 04/2014 resulted in cath and PCI for occlusion of SVG-OM treated with 2 DES on 04/23/2015 by Dr. Lauree Chandler. Otherwise had patent SVG-RCA with severe disease in the body of the graft, patent LIMA-LAD, and patent SVG-Diagonal. He underwent staged intervention to the vein graft to the RCA on 04/25/2014 during the same admission by Dr. Peter M Martinique . He was on DAPT (ASA and Brilinta) until 04/2015 and now on ASA only. Normal LVEF by echo 04/2014.   He presented to [facility] today with increasing episodes of chest pressure for tthe past month or so concerning for unstable angina. Episodes of chest pressure triggered by activity (but sometimes come during rest as well), improve by sl NTG, radiate to neck and arm. Denies orthopnea, bleeding, syncope. He reports occasional left flank pain for 5 years but does not have known renal dysfunction.    In the ER, TropI negative, Cr 1.7 with BUN 28 (had normal Cr 04/2014). ECG NSR, non specific T wave abnormality. Heparin infusion started in ER.    Review of Systems:  10 systems reviewed unremarkable except as noted in HPI    Past Medical History  Diagnosis Date  . Coronary atherosclerosis of native coronary artery     Multivessel status post CABG, DES x 2 SVG to OM with staged DES SVG to RCA November 2015  . Essential hypertension, benign   . Carotid artery disease ([place])     123456 LICA and [id number] RICA - December 2013  . Sinus bradycardia     Asymptomatic  . Gout   . HOH (hard of hearing)     No current facility-administered medications on file prior to encounter.   Current Outpatient Prescriptions on File Prior to Encounter  Medication Sig Dispense Refill  . aspirin EC 81 MG tablet Take 81 mg by mouth daily.      Marland Kitchen COLCRYS 0.6 MG tablet Take 1 tablet by mouth Once daily as  needed (for gout).     Marland Kitchen ibuprofen (ADVIL,MOTRIN) 200 MG tablet Take 200 mg by mouth every 6 (six) hours as needed for moderate pain.     . metoprolol tartrate (LOPRESSOR) 25 MG tablet Take 1 tablet (25 mg total) by mouth 2 (two) times daily. 180 tablet 3  . nitroGLYCERIN (NITROSTAT) 0.4 MG SL tablet Place 1 tablet (0.4 mg total) under the tongue every 5 (five) minutes as needed. 25 tablet 3  . Saw Palmetto, Serenoa repens, 450 MG CAPS Take by mouth. Take 1 capsule in AM and 2 capsules in PM    . simvastatin (ZOCOR) 40 MG tablet Take 0.5 tablets (20 mg total) by mouth daily. 45 tablet 3  . pantoprazole (PROTONIX) 40 MG tablet Take 1 tablet (40 mg total) by mouth daily before breakfast. (Patient not taking: Reported on 05/18/2015) 30 tablet 3      Allergies  Allergen Reactions  . Protonix [Pantoprazole Sodium] Itching and Rash    Social History   Social History  . Marital Status: Married    Spouse Name: N/A  . Number of Children: N/A  . Years of Education: N/A   Occupational History  . DISABLED     [employer]   Social History Main Topics  . Smoking status: Former Smoker -- 0.50 packs/day for 50 years    Types: Cigarettes  Start date: 01/08/1947    Quit date: 06/08/1999  . Smokeless tobacco: Never Used  . Alcohol Use: No  . Drug Use: No  . Sexual Activity: Yes    Birth Control/ Protection: None   Other Topics Concern  . Not on file   Social History Narrative   He lives in [place] with his wife.    Family History  Problem Relation Age of Onset  . Cancer Father     colon, age 60s    PHYSICAL EXAM: Filed Vitals:   05/18/15 1801 05/18/15 1852  BP: 176/94 171/79  Pulse: 71 71  Temp:  98.6 F (37 C)  Resp: 19 16   General:  Well appearing. No respiratory difficulty HEENT: normal Neck: supple. no JVD. Carotids 2+ bilat; no bruits. No lymphadenopathy or thryomegaly appreciated. Cor: PMI nondisplaced. Regular rate & rhythm. No rubs, gallops or  murmurs. Lungs: clear Abdomen: soft, nontender, nondistended. No hepatosplenomegaly. No bruits or masses. Good bowel sounds. Extremities: no cyanosis, clubbing, rash, edema Neuro: alert & oriented x 3, cranial nerves grossly intact. moves all 4 extremities w/o difficulty. Affect pleasant.  ECG:  Results for orders placed or performed during the hospital encounter of 05/18/15 (from the past 24 hour(s))  Basic metabolic panel     Status: Abnormal   Collection Time: 05/18/15  2:37 PM  Result Value Ref Range   Sodium 140 135 - 145 mmol/L   Potassium 4.8 3.5 - 5.1 mmol/L   Chloride 108 101 - 111 mmol/L   CO2 27 22 - 32 mmol/L   Glucose, Bld 133 (H) 65 - 99 mg/dL   BUN 28 (H) 6 - 20 mg/dL   Creatinine, Ser 1.72 (H) 0.61 - 1.24 mg/dL   Calcium 9.4 8.9 - 10.3 mg/dL   GFR calc non Af Amer 36 (L) >60 mL/min   GFR calc Af Amer 42 (L) >60 mL/min   Anion gap 5 5 - 15  CBC     Status: Abnormal   Collection Time: 05/18/15  2:37 PM  Result Value Ref Range   WBC 6.6 4.0 - 10.5 K/uL   RBC 3.97 (L) 4.22 - 5.81 MIL/uL   Hemoglobin 10.2 (L) 13.0 - 17.0 g/dL   HCT 33.4 (L) 39.0 - 52.0 %   MCV 84.1 78.0 - 100.0 fL   MCH 25.7 (L) 26.0 - 34.0 pg   MCHC 30.5 30.0 - 36.0 g/dL   RDW 13.7 11.5 - 15.5 %   Platelets 361 150 - 400 K/uL  Troponin I     Status: None   Collection Time: 05/18/15  2:37 PM  Result Value Ref Range   Troponin I <0.03 <0.031 ng/mL   Dg Chest 2 View  05/18/2015  CLINICAL DATA:  Chest pain. EXAM: CHEST  2 VIEW COMPARISON:  April 22, 2014. FINDINGS: The heart size and mediastinal contours are within normal limits. Both lungs are clear. No pneumothorax or pleural effusion is noted. Status post coronary artery bypass graft. Atherosclerosis of descending thoracic aorta is noted. The visualized skeletal structures are unremarkable. IMPRESSION: No active cardiopulmonary disease. Electronically Signed   By: Marijo Conception, M.D.   On: 05/18/2015 15:30     ASSESSMENT:  1.  Accelerating angina in the setting of known CAD, 5 V CABG in 2001, PCI to two SVG in 04/2014. No MI.  2. AKI Cr 1.7. Cause not known.  3. HTN, uncontrolled.    PLAN/DISCUSSION:  Plan for cath in am IVF  Monitor  renal function and lytes to see if renal function improves with IVF.   Please refer to orders for details.     Wandra Mannan, MD Cardiology   Second TnI came back positive. He is CP free at this time.  Will treat him as NSTEMI. Will resume Heparin infusion, Load Brilinta. Continue ASAS, statin, beta blocker.   Wandra Mannan, MD

## 2015-05-18 NOTE — ED Notes (Signed)
Patient left ED at this time with [facility]. No distress.

## 2015-05-18 NOTE — Progress Notes (Signed)
Initial troponin came back at 0.23. Cardiology Fellow made aware. New orders received to restart heparin and start brilinta. MD entering orders. Will continue to monitor closely

## 2015-05-18 NOTE — Progress Notes (Signed)
ANTICOAGULATION CONSULT NOTE - Initial Consult  Pharmacy Consult for heparin Indication: chest pain/ACS  Allergies  Allergen Reactions  . Protonix [Pantoprazole Sodium] Itching and Rash    Patient Measurements: Height: 5' 7.5" (171.5 cm) Weight: 142 lb (64.411 kg) IBW/kg (Calculated) : 67.25   Vital Signs: Temp: 97.7 F (36.5 C) (12/11 1431) Temp Source: Oral (12/11 1431) BP: 145/80 mmHg (12/11 1539) Pulse Rate: 59 (12/11 1539)  Labs:  Recent Labs  05/18/15 1437  HGB 10.2*  HCT 33.4*  PLT 361  CREATININE 1.72*  TROPONINI <0.03    Estimated Creatinine Clearance: 32.2 mL/min (by C-G formula based on Cr of 1.72).   Medical History: Past Medical History  Diagnosis Date  . Coronary atherosclerosis of native coronary artery     Multivessel status post CABG, DES x 2 SVG to OM with staged DES SVG to RCA November 2015  . Essential hypertension, benign   . Carotid artery disease ([place])     123456 LICA and [id number] RICA - December 2013  . Sinus bradycardia     Asymptomatic  . Gout   . HOH (hard of hearing)     Medications:  See medication history  Assessment: 80 yo man to start heparin for CP.  He was not on anticoagulation prior to admit. Goal of Therapy:  Heparin level 0.3-0.7 units/ml Monitor platelets by anticoagulation protocol: Yes   Plan:  Heparin 3000 unit bolus and drip at 1000 units/hr Check heparin level in ~ 6-8 hours Monitor for bleeding complications.  Munir Victorian, [place] 05/18/2015,4:46 PM

## 2015-05-19 ENCOUNTER — Encounter (HOSPITAL_COMMUNITY)
Admission: EM | Disposition: A | Discharge status: Home / Self Care | Payer: Self-pay | Source: Home / Self Care | Attending: Cardiology

## 2015-05-19 ENCOUNTER — Encounter (HOSPITAL_COMMUNITY): Payer: Self-pay | Admitting: Cardiovascular Disease

## 2015-05-19 DIAGNOSIS — I214 Non-ST elevation (NSTEMI) myocardial infarction: Secondary | ICD-10-CM | POA: Insufficient documentation

## 2015-05-19 DIAGNOSIS — D500 Iron deficiency anemia secondary to blood loss (chronic): Secondary | ICD-10-CM

## 2015-05-19 DIAGNOSIS — N183 Chronic kidney disease, stage 3 (moderate): Secondary | ICD-10-CM

## 2015-05-19 DIAGNOSIS — I10 Essential (primary) hypertension: Secondary | ICD-10-CM

## 2015-05-19 DIAGNOSIS — I25710 Atherosclerosis of autologous vein coronary artery bypass graft(s) with unstable angina pectoris: Secondary | ICD-10-CM

## 2015-05-19 HISTORY — PX: CARDIAC CATHETERIZATION: SHX172

## 2015-05-19 LAB — BASIC METABOLIC PANEL
ANION GAP: 7 (ref 5–15)
BUN: 21 mg/dL — AB (ref 6–20)
CO2: 25 mmol/L (ref 22–32)
CREATININE: 1.5 mg/dL — AB (ref 0.61–1.24)
Calcium: 8.9 mg/dL (ref 8.9–10.3)
Chloride: 110 mmol/L (ref 101–111)
GFR, EST AFRICAN AMERICAN: 50 mL/min — AB (ref >60)
GFR, EST NON AFRICAN AMERICAN: 43 mL/min — AB (ref >60)
Glucose, Bld: 111 mg/dL — ABNORMAL HIGH (ref 65–99)
POTASSIUM: 4.1 mmol/L (ref 3.5–5.1)
Sodium: 142 mmol/L (ref 135–145)

## 2015-05-19 LAB — CBC
HEMATOCRIT: 29.3 % — AB (ref 39.0–52.0)
Hemoglobin: 8.8 g/dL — ABNORMAL LOW (ref 13.0–17.0)
MCH: 25.3 pg — ABNORMAL LOW (ref 26.0–34.0)
MCHC: 30 g/dL (ref 30.0–36.0)
MCV: 84.2 fL (ref 78.0–100.0)
PLATELETS: 278 10*3/uL (ref 150–400)
RBC: 3.48 MIL/uL — ABNORMAL LOW (ref 4.22–5.81)
RDW: 13.9 % (ref 11.5–15.5)
WBC: 5.6 10*3/uL (ref 4.0–10.5)

## 2015-05-19 LAB — PROTIME-INR
INR: 1.21 (ref 0.00–1.49)
PROTHROMBIN TIME: 15.5 s — AB (ref 11.6–15.2)

## 2015-05-19 LAB — TROPONIN I: TROPONIN I: 0.4 ng/mL — AB (ref <0.031)

## 2015-05-19 LAB — POCT ACTIVATED CLOTTING TIME: ACTIVATED CLOTTING TIME: 322 s

## 2015-05-19 LAB — HEPARIN LEVEL (UNFRACTIONATED): HEPARIN UNFRACTIONATED: 0.51 [IU]/mL (ref 0.30–0.70)

## 2015-05-19 SURGERY — LEFT HEART CATH AND CORS/GRAFTS ANGIOGRAPHY
Anesthesia: LOCAL

## 2015-05-19 MED ORDER — SODIUM CHLORIDE 0.9 % IV SOLN: 250.0000 mL | INTRAVENOUS | Status: DC | PRN

## 2015-05-19 MED ORDER — HEPARIN (PORCINE) IN NACL 2-0.9 UNIT/ML-% IJ SOLN
INTRAMUSCULAR | Status: AC
  Filled 2015-05-19: qty 1500

## 2015-05-19 MED ORDER — SODIUM CHLORIDE 0.9 % WEIGHT BASED INFUSION
3.0000 mL/kg/h | INTRAVENOUS | Status: AC
Start: 2015-05-19 — End: 2015-05-19
  Administered 2015-05-19: 14:00:00 3 mL/kg/h via INTRAVENOUS

## 2015-05-19 MED ORDER — SODIUM CHLORIDE 0.9 % IV SOLN
250.0000 mg | INTRAVENOUS | Status: DC | PRN
  Administered 2015-05-19: 1.75 mg/kg/h via INTRAVENOUS

## 2015-05-19 MED ORDER — HEPARIN (PORCINE) IN NACL 100-0.45 UNIT/ML-% IJ SOLN
1000.0000 [IU]/h | INTRAMUSCULAR | Status: DC
  Administered 2015-05-19: 1000 [IU]/h via INTRAVENOUS

## 2015-05-19 MED ORDER — MORPHINE SULFATE (PF) 2 MG/ML IV SOLN
2.0000 mg | INTRAVENOUS | Status: DC | PRN
  Administered 2015-05-19: 16:00:00 2 mg via INTRAVENOUS
  Filled 2015-05-19: qty 1

## 2015-05-19 MED ORDER — LIDOCAINE HCL (PF) 1 % IJ SOLN
INTRAMUSCULAR | Status: AC
  Filled 2015-05-19: qty 30

## 2015-05-19 MED ORDER — HYDRALAZINE HCL 20 MG/ML IJ SOLN
10.0000 mg | INTRAMUSCULAR | Status: DC | PRN
  Administered 2015-05-19 – 2015-05-20 (×2): 10 mg via INTRAVENOUS
  Filled 2015-05-19 (×2): qty 1

## 2015-05-19 MED ORDER — LIDOCAINE HCL (PF) 1 % IJ SOLN
INTRAMUSCULAR | Status: DC | PRN
  Administered 2015-05-19: 14:00:00

## 2015-05-19 MED ORDER — SODIUM CHLORIDE 0.9 % IJ SOLN: 3.0000 mL | INTRAMUSCULAR | Status: DC | PRN

## 2015-05-19 MED ORDER — TICAGRELOR 90 MG PO TABS
ORAL_TABLET | ORAL | Status: DC | PRN
  Administered 2015-05-19: 90 mg via ORAL

## 2015-05-19 MED ORDER — SODIUM CHLORIDE 0.9 % IV SOLN
INTRAVENOUS | Status: DC
Start: 2015-05-19 — End: 2015-05-19

## 2015-05-19 MED ORDER — ACETAMINOPHEN 325 MG PO TABS: 650.0000 mg | ORAL_TABLET | ORAL | Status: DC | PRN

## 2015-05-19 MED ORDER — TICAGRELOR 90 MG PO TABS
ORAL_TABLET | ORAL | Status: AC
  Filled 2015-05-19: qty 1

## 2015-05-19 MED ORDER — HEART ATTACK BOUNCING BOOK
Freq: Once | Status: AC
  Administered 2015-05-19: 20:00:00
  Filled 2015-05-19: qty 1

## 2015-05-19 MED ORDER — ANGIOPLASTY BOOK
Freq: Once | Status: AC
  Administered 2015-05-19: 20:00:00
  Filled 2015-05-19: qty 1

## 2015-05-19 MED ORDER — SODIUM CHLORIDE 0.9 % IV SOLN: INTRAVENOUS | Status: DC

## 2015-05-19 MED ORDER — IOHEXOL 350 MG/ML SOLN
INTRAVENOUS | Status: DC | PRN
  Administered 2015-05-19: 105 mL via INTRACARDIAC

## 2015-05-19 MED ORDER — ONDANSETRON HCL 4 MG/2ML IJ SOLN
4.0000 mg | Freq: Four times a day (QID) | INTRAMUSCULAR | Status: DC | PRN
  Administered 2015-05-20: 06:00:00 4 mg via INTRAVENOUS
  Filled 2015-05-19: qty 2

## 2015-05-19 MED ORDER — TICAGRELOR 90 MG PO TABS
90.0000 mg | ORAL_TABLET | Freq: Two times a day (BID) | ORAL | Status: DC
  Administered 2015-05-19 – 2015-05-20 (×2): 90 mg via ORAL
  Filled 2015-05-19 (×2): qty 1

## 2015-05-19 MED ORDER — BIVALIRUDIN 250 MG IV SOLR
INTRAVENOUS | Status: AC
Start: 2015-05-19 — End: 2015-05-19
  Filled 2015-05-19: qty 250

## 2015-05-19 MED ORDER — SODIUM CHLORIDE 0.9 % IJ SOLN: 3.0000 mL | Freq: Two times a day (BID) | INTRAMUSCULAR | Status: DC

## 2015-05-19 MED ORDER — FAMOTIDINE IN NACL 20-0.9 MG/50ML-% IV SOLN
20.0000 mg | Freq: Two times a day (BID) | INTRAVENOUS | Status: DC
  Administered 2015-05-19 – 2015-05-20 (×3): 20 mg via INTRAVENOUS
  Filled 2015-05-19 (×4): qty 50

## 2015-05-19 MED ORDER — ASPIRIN 81 MG PO CHEW: 81.0000 mg | CHEWABLE_TABLET | Freq: Every day | ORAL | Status: DC

## 2015-05-19 MED ORDER — BIVALIRUDIN BOLUS VIA INFUSION - CUPID
INTRAVENOUS | Status: DC | PRN
  Administered 2015-05-19: 47.625 mg via INTRAVENOUS

## 2015-05-19 SURGICAL SUPPLY — 19 items
BALLN EMERGE MR 2.0X12 (BALLOONS) ×2
BALLN ~~LOC~~ EMERGE MR 3.25X12 (BALLOONS) ×2
BALLOON EMERGE MR 2.0X12 (BALLOONS) ×1 IMPLANT
BALLOON ~~LOC~~ EMERGE MR 3.25X12 (BALLOONS) ×1 IMPLANT
CATH INFINITI 5FR MULTPACK ANG (CATHETERS) ×2 IMPLANT
CATH VISTA GUIDE 6FR JR4 (CATHETERS) ×4 IMPLANT
KIT ENCORE 26 ADVANTAGE (KITS) ×2 IMPLANT
KIT HEART LEFT (KITS) ×2 IMPLANT
PACK CARDIAC CATHETERIZATION (CUSTOM PROCEDURE TRAY) ×2 IMPLANT
PINNACLE LONG 6F 25CM (SHEATH) ×2
SHEATH INTRO PINNACLE 6F 25CM (SHEATH) ×1 IMPLANT
SHEATH PINNACLE 5F 10CM (SHEATH) ×2 IMPLANT
SHEATH PINNACLE 6F 10CM (SHEATH) ×2 IMPLANT
STENT SYNERGY DES 3X16 (Permanent Stent) ×2 IMPLANT
TRANSDUCER W/STOPCOCK (MISCELLANEOUS) ×2 IMPLANT
WIRE ASAHI PROWATER 180CM (WIRE) ×2 IMPLANT
WIRE EMERALD 3MM-J .035X150CM (WIRE) ×2 IMPLANT
WIRE HI TORQ VERSACORE-J 145CM (WIRE) IMPLANT
WIRE SAFE-T 1.5MM-J .035X260CM (WIRE) ×2 IMPLANT

## 2015-05-19 NOTE — Progress Notes (Signed)
Utilization review completed.  

## 2015-05-19 NOTE — Interval H&P Note (Signed)
Cath Lab Visit (complete for each Cath Lab visit)  Clinical Evaluation Leading to the Procedure:   ACS: Yes.    Non-ACS:    Anginal Classification: CCS IV  Anti-ischemic medical therapy: Minimal Therapy (1 class of medications)  Non-Invasive Test Results: No non-invasive testing performed  Prior CABG: Previous CABG      History and Physical Interval Note:  05/19/2015 12:36 PM  Billy [name]  has presented today for surgery, with the diagnosis of NSTEMI  The various methods of treatment have been discussed with the patient and family. After consideration of risks, benefits and other options for treatment, the patient has consented to  Procedure(s): Left Heart Cath and Cors/Grafts Angiography (N/A) as a surgical intervention .  The patient's history has been reviewed, patient examined, no change in status, stable for surgery.  I have reviewed the patient's chart and labs.  Questions were answered to the patient's satisfaction.     Quay Burow

## 2015-05-19 NOTE — Plan of Care (Signed)
Problem: Tissue Perfusion: Goal: Risk factors for ineffective tissue perfusion will decrease Outcome: Progressing Pt is wearing Ted Hose and on IV heparin drip. Able to move and turn self

## 2015-05-19 NOTE — Progress Notes (Signed)
[place] for heparin Indication: chest pain/ACS  Allergies  Allergen Reactions  . Protonix [Pantoprazole Sodium] Itching and Rash    Patient Measurements: Height: 5' 7.5" (171.5 cm) Weight: 146 lb 11.2 oz (66.543 kg) IBW/kg (Calculated) : 67.25   Vital Signs: Temp: 97.4 F (36.3 C) (12/11 2041) Temp Source: Oral (12/11 2041) BP: 140/75 mmHg (12/11 2210) Pulse Rate: 68 (12/11 2210)  Labs:  Recent Labs  05/18/15 1437 05/18/15 2143  HGB 10.2*  --   HCT 33.4*  --   PLT 361  --   CREATININE 1.72*  --   TROPONINI <0.03 0.23*    Estimated Creatinine Clearance: 33.3 mL/min (by C-G formula based on Cr of 1.72).  Assessment: 80 yo male with chest pain and elevated cardiac markers for heparin.  Heparin turned off at ~ 10 pm  Goal of Therapy:  Heparin level 0.3-0.7 units/ml Monitor platelets by anticoagulation protocol: Yes   Plan:  Restart heparin 1000 units/hr Follow-up am labs.    Caryl Pina 05/19/2015,12:03 AM

## 2015-05-19 NOTE — Progress Notes (Addendum)
Patient ID: Billy [name], male   DOB: March 02, 1935, 80 y.o.   MRN: [id number]    Subjective:  Denies SSCP, palpitations or Dyspnea See [facility] below for recent GI history  Objective:  Filed Vitals:   05/18/15 2210 05/19/15 0500 05/19/15 0607 05/19/15 0832  BP: 140/75 150/85  157/66  Pulse: 68 61  74  Temp:  98 F (36.7 C)  97.3 F (36.3 C)  TempSrc:    Oral  Resp:  19  18  Height:   5' 7.5" (1.715 m)   Weight:   63.504 kg (140 lb)   SpO2:  98%  100%    Intake/Output from previous day:  Intake/Output Summary (Last 24 hours) at 05/19/15 [id number] Last data filed at 05/19/15 [id number]  Gross per 24 hour  Intake 1236.16 ml  Output   2000 ml  Net -763.84 ml    Physical Exam: Affect appropriate Healthy:  appears stated age HEENT: normal Neck supple with no adenopathy JVP normal no bruits no thyromegaly Lungs clear with no wheezing and good diaphragmatic motion Heart:  S1/S2 no murmur, no rub, gallop or click Previous sternotomy  PMI normal Abdomen: benighn, BS positve, no tenderness, no AAA no bruit.  No HSM or HJR Distal pulses intact with no bruits No edema Neuro non-focal Skin warm and dry No muscular weakness   Lab Results: Basic Metabolic Panel:  Recent Labs  05/18/15 1437 05/19/15 0340  NA 140 142  K 4.8 4.1  CL 108 110  CO2 27 25  GLUCOSE 133* 111*  BUN 28* 21*  CREATININE 1.72* 1.50*  CALCIUM 9.4 8.9   CBC:  Recent Labs  05/18/15 1437 05/19/15 0340  WBC 6.6 5.6  HGB 10.2* 8.8*  HCT 33.4* 29.3*  MCV 84.1 84.2  PLT 361 278   Cardiac Enzymes:  Recent Labs  05/18/15 1437 05/18/15 2143 05/19/15 0340  TROPONINI <0.03 0.23* 0.40*    Imaging: Dg Chest 2 View  05/18/2015  CLINICAL DATA:  Chest pain. EXAM: CHEST  2 VIEW COMPARISON:  April 22, 2014. FINDINGS: The heart size and mediastinal contours are within normal limits. Both lungs are clear. No pneumothorax or pleural effusion is noted. Status post coronary artery bypass graft. Atherosclerosis of  descending thoracic aorta is noted. The visualized skeletal structures are unremarkable. IMPRESSION: No active cardiopulmonary disease. Electronically Signed   By: Marijo Conception, M.D.   On: 05/18/2015 15:30    Cardiac Studies:  ECG:    Telemetry:  NSR  05/19/2015   Echo:   04/2014    Study Conclusions  - Left ventricle: E/e&'>14.5 suggestive of elevated LV filling pressures. The cavity size was normal. There was mild focal basal hypertrophy of the septum. Systolic function was normal. The estimated ejection fraction was in the range of 55% to 60%. There is akinesis of the basal-midinferolateral myocardium. - Aortic valve: Mildly calcified leaflets. There was trivial regurgitation. - Mitral valve: There was mild regurgitation. - Atrial septum: There was increased thickness of the septum, consistent with lipomatous hypertrophy. - Pulmonary arteries: PA peak pressure: 44 mm Hg (S).  Medications:   . aspirin EC  81 mg Oral Daily  . isosorbide mononitrate  30 mg Oral Daily  . metoprolol tartrate  25 mg Oral BID  . simvastatin  20 mg Oral Daily     . sodium chloride 100 mL/hr at 05/18/15 2213  . heparin 1,000 Units/hr (05/19/15 0010)    Assessment/Plan:  SEMI:  Distant CABG 2001 with stents to  SVG Om and RCA already most recently 04/2014.  Diagnostic cath today.  Continue heparin nitrates and beta blocker Anemia:  Had negative colonoscopy 1.5 years ago.  Has seen Rehman and was scheduled to have EGD 12/15.  No PUD symptoms.  Had "black": stools 3 weeks Ago but this has cleared.  Consider diagnostic cath today and if intervention needed consider postponing and doing EGD in house with GI consult post cath.  Will Start iv pepcid ? Allergy to protonix Chol:  On statin   Jenkins Rouge 05/19/2015, 9:37 AM

## 2015-05-19 NOTE — Plan of Care (Signed)
Problem: Consults Goal: Cardiac Cath Patient Education (See Patient Education module for education specifics.) Outcome: Progressing Offered video and pt wanted to sleep. Pt is inquisitive about catheterization, asking questions. Answered questions about catheter insertion and time of catheterization. Goal: Skin Care Protocol Initiated - if Braden Score 18 or less If consults are not indicated, leave blank or document N/A Outcome: Not Applicable Date Met:  93/38/82 p

## 2015-05-19 NOTE — Consult Note (Addendum)
Referring Provider: Dr. Johnsie Cancel Primary Care Physician:  Curlene Labrum, MD Primary Gastroenterologist:  Althia Forts  Reason for Consultation:  Anemia  HPI: Billy [name] is a 80 y.o. male with onset of loose black stools 2 weeks ago that have resolved who was admitted for an NSTEMI and is s/p cardiac cath today. He denies seeing any black stools in the past 1-2 weeks. Denies hematochezia. Denies abdominal pain, nausea, vomiting, dizziness. On Aspirin 81 mg/day but otherwise denies NSAIDs. Wife reports that he was on Brilinta until the end of November. Wife reports normal colonoscopy 1.5 years ago. Denies ever having an EGD. Denies hematochezia or hematemesis. Hgb 8.8 (10.2 yesterday).  Past Medical History  Diagnosis Date  . Coronary atherosclerosis of native coronary artery     Multivessel status post CABG, DES x 2 SVG to OM with staged DES SVG to RCA November 2015  . Essential hypertension, benign   . Carotid artery disease ([place])     123456 LICA and [id number] RICA - December 2013  . Sinus bradycardia     Asymptomatic  . Gout   . HOH (hard of hearing)     Past Surgical History  Procedure Laterality Date  . Coronary artery bypass graft  11/09/1999    LIMA to LAD, SVG to diagonal, SVG to OM1 and OM2, SVG to PDA  . Appendectomy      Ruptured  . Cataract extraction w/phaco Right 11/13/2012    Procedure: CATARACT EXTRACTION PHACO AND INTRAOCULAR LENS PLACEMENT (IOC);  Surgeon: Tonny Branch, MD;  Location: [facility];  Service: Ophthalmology;  Laterality: Right;  CDE:12.75  . Cataract extraction w/phaco Left 01/11/2013    Procedure: CATARACT EXTRACTION PHACO AND INTRAOCULAR LENS PLACEMENT (IOC);  Surgeon: Tonny Branch, MD;  Location: [facility];  Service: Ophthalmology;  Laterality: Left;  CDE: 21.13  . Left heart catheterization with coronary angiogram N/A 04/22/2014    Procedure: LEFT HEART CATHETERIZATION WITH CORONARY ANGIOGRAM;  Surgeon: Burnell Blanks, MD;  Location: [facility] CATH LAB;  Service:  Cardiovascular;  Laterality: N/A;  . Percutaneous coronary stent intervention (pci-s) N/A 04/25/2014    Procedure: PERCUTANEOUS CORONARY STENT INTERVENTION (PCI-S);  Surgeon: Peter M Martinique, MD;  Location: [facility] CATH LAB;  Service: Cardiovascular;  Laterality: N/A;  . Colonoscopy  09/2013    Dr. Burke Keels: colitis descending colon and sigmoid colon.Bx ?Cdiff vs ischemic.  . Cardiac catheterization N/A 05/19/2015    Procedure: Left Heart Cath and Cors/Grafts Angiography;  Surgeon: Lorretta Harp, MD;  Location: [place] CV LAB;  Service: Cardiovascular;  Laterality: N/A;  . Cardiac catheterization N/A 05/19/2015    Procedure: Coronary Stent Intervention;  Surgeon: Lorretta Harp, MD;  Location: [place] CV LAB;  Service: Cardiovascular;  Laterality: N/A;    Prior to Admission medications   Medication Sig Start Date End Date Taking? Authorizing Provider  aspirin EC 81 MG tablet Take 81 mg by mouth daily.     Yes Historical Provider, MD  COLCRYS 0.6 MG tablet Take 1 tablet by mouth Once daily as needed (for gout).  03/05/12  Yes Historical Provider, MD  isosorbide mononitrate (IMDUR) 60 MG 24 hr tablet Take 30 mg by mouth daily.   Yes Historical Provider, MD  metoprolol tartrate (LOPRESSOR) 25 MG tablet Take 1 tablet (25 mg total) by mouth 2 (two) times daily. 04/07/15  Yes Satira Sark, MD  nitroGLYCERIN (NITROSTAT) 0.4 MG SL tablet Place 1 tablet (0.4 mg total) under the tongue every 5 (five) minutes as needed.  06/15/13  Yes Satira Sark, MD  Saw Palmetto, Serenoa repens, 450 MG CAPS Take by mouth. Take 1 capsule in AM and 2 capsules in PM   Yes Historical Provider, MD  simvastatin (ZOCOR) 40 MG tablet Take 0.5 tablets (20 mg total) by mouth daily. 12/02/14  Yes Satira Sark, MD  fluticasone (FLONASE) 50 MCG/ACT nasal spray Place 1-2 sprays into both nostrils daily as needed. 04/04/15   Historical Provider, MD  pantoprazole (PROTONIX) 40 MG tablet Take 1 tablet (40 mg total)  by mouth daily before breakfast. Patient not taking: Reported on 05/18/2015 03/19/15   Mahala Menghini, PA-C    Scheduled Meds: . aspirin EC  81 mg Oral Daily  . famotidine (PEPCID) IV  20 mg Intravenous Q12H  . isosorbide mononitrate  30 mg Oral Daily  . metoprolol tartrate  25 mg Oral BID  . simvastatin  20 mg Oral Daily  . ticagrelor  90 mg Oral BID   Continuous Infusions: . sodium chloride    . sodium chloride 3 mL/kg/hr (05/19/15 1400)   PRN Meds:.acetaminophen, hydrALAZINE, morphine injection, nitroGLYCERIN, ondansetron (ZOFRAN) IV  Allergies as of 05/18/2015 - Review Complete 05/18/2015  Allergen Reaction Noted  . Protonix [pantoprazole sodium] Itching and Rash 05/18/2015    Family History  Problem Relation Age of Onset  . Cancer Father     colon, age 37s    Social History   Social History  . Marital Status: Married    Spouse Name: N/A  . Number of Children: N/A  . Years of Education: N/A   Occupational History  . DISABLED     [employer]   Social History Main Topics  . Smoking status: Former Smoker -- 0.50 packs/day for 50 years    Types: Cigarettes    Start date: 01/08/1947    Quit date: 06/08/1999  . Smokeless tobacco: Never Used  . Alcohol Use: No  . Drug Use: No  . Sexual Activity: Yes    Birth Control/ Protection: None   Other Topics Concern  . Not on file   Social History Narrative   He lives in [place] with his wife.    Review of Systems: All negative except as stated above in HPI.  Physical Exam: Vital signs: Filed Vitals:   05/19/15 1545 05/19/15 1600  BP: 194/84 134/72  Pulse: 70 64  Temp:    Resp: 20 10   Last BM Date: 05/18/15 General:   Alert,  Well-developed, well-nourished, pleasant and cooperative in NAD, elderly Head: atraumatic Eyes: pupils equal and reactive, anicteric sclera ENT: oropharynx clear Neck: supple, nontender Lungs:  Clear throughout to auscultation.   No wheezes, crackles, or rhonchi. No  acute distress. Heart:  Regular rate and rhythm; no murmurs, clicks, rubs,  or gallops. Abdomen: soft, nontender, nondistended, +BS  Rectal:  Deferred Ext: no edema Psych: normal mood, affect  GI:  Lab Results:  Recent Labs  05/18/15 1437 05/19/15 0340  WBC 6.6 5.6  HGB 10.2* 8.8*  HCT 33.4* 29.3*  PLT 361 278   BMET  Recent Labs  05/18/15 1437 05/19/15 0340  NA 140 142  K 4.8 4.1  CL 108 110  CO2 27 25  GLUCOSE 133* 111*  BUN 28* 21*  CREATININE 1.72* 1.50*  CALCIUM 9.4 8.9   LFT No results for input(s): PROT, ALBUMIN, AST, ALT, ALKPHOS, BILITOT, BILIDIR, IBILI in the last 72 hours. PT/INR  Recent Labs  05/19/15 0340  LABPROT 15.5*  INR 1.21  Studies/Results: Dg Chest 2 View  05/18/2015  CLINICAL DATA:  Chest pain. EXAM: CHEST  2 VIEW COMPARISON:  April 22, 2014. FINDINGS: The heart size and mediastinal contours are within normal limits. Both lungs are clear. No pneumothorax or pleural effusion is noted. Status post coronary artery bypass graft. Atherosclerosis of descending thoracic aorta is noted. The visualized skeletal structures are unremarkable. IMPRESSION: No active cardiopulmonary disease. Electronically Signed   By: Marijo Conception, M.D.   On: 05/18/2015 15:30    Impression/Plan: 80 yo with anemia and recent NSTEMI s/p cardiac cath today in need of an EGD to look for peptic ulcer disease. Allergy to Protonix. Keep on IV Pepcid pending EGD results. Patient on Brilinta. Supportive care. Follow H/Hs.   LOS: 1 day   Exira C.  05/19/2015, 4:05 PM  Pager [phone]  If no answer or after 5 PM call [phone]

## 2015-05-19 NOTE — Progress Notes (Signed)
Site area: right groin  Site Prior to Removal:  Level 0  Pressure Applied For 20 MINUTES    Minutes Beginning at 1625  Manual:   Yes.    Patient Status During Pull:  stable  Post Pull Groin Site:  Level 0  Post Pull Instructions Given:  Yes.    Post Pull Pulses Present:  Yes.    Dressing Applied:  Yes.    Comments:

## 2015-05-19 NOTE — Progress Notes (Signed)
[place] for heparin Indication: chest pain/ACS  Allergies  Allergen Reactions  . Protonix [Pantoprazole Sodium] Itching and Rash    Patient Measurements: Height: 5' 7.5" (171.5 cm) Weight: 140 lb (63.504 kg) IBW/kg (Calculated) : 67.25   Vital Signs: Temp: 97.3 F (36.3 C) (12/12 0832) Temp Source: Oral (12/12 0832) BP: 157/66 mmHg (12/12 0832) Pulse Rate: 74 (12/12 0832)  Labs:  Recent Labs  05/18/15 1437 05/18/15 2143 05/19/15 0340 05/19/15 0803  HGB 10.2*  --  8.8*  --   HCT 33.4*  --  29.3*  --   PLT 361  --  278  --   LABPROT  --   --  15.5*  --   INR  --   --  1.21  --   HEPARINUNFRC  --   --   --  0.51  CREATININE 1.72*  --  1.50*  --   TROPONINI <0.03 0.23* 0.40*  --     Estimated Creatinine Clearance: 36.5 mL/min (by C-G formula based on Cr of 1.5).   Medical History: Past Medical History  Diagnosis Date  . Coronary atherosclerosis of native coronary artery     Multivessel status post CABG, DES x 2 SVG to OM with staged DES SVG to RCA November 2015  . Essential hypertension, benign   . Carotid artery disease ([place])     123456 LICA and [id number] RICA - December 2013  . Sinus bradycardia     Asymptomatic  . Gout   . HOH (hard of hearing)     Medications:  See EMR   Assessment: 80 yo male on heparin gtt for NSTEMI. Heparin gtt therapeutic, hgb down slightly 12 > 8.8, plts stable.    Goal of Therapy:  Heparin level 0.3-0.7 units/ml Monitor platelets by anticoagulation protocol: Yes    Plan:  Heparin gtt at 1000 units/hr Monitor for bleeding complications. F/u after cath, possibly today    Beila Purdie, Jake Church 05/19/2015,9:51 AM

## 2015-05-19 NOTE — H&P (View-Only) (Signed)
Patient ID: Billy [name], male   DOB: Nov 01, 1934, 80 y.o.   MRN: [id number]    Subjective:  Denies SSCP, palpitations or Dyspnea See AP below for recent GI history  Objective:  Filed Vitals:   05/18/15 2210 05/19/15 0500 05/19/15 0607 05/19/15 0832  BP: 140/75 150/85  157/66  Pulse: 68 61  74  Temp:  98 F (36.7 C)  97.3 F (36.3 C)  TempSrc:    Oral  Resp:  19  18  Height:   5' 7.5" (1.715 m)   Weight:   63.504 kg (140 lb)   SpO2:  98%  100%    Intake/Output from previous day:  Intake/Output Summary (Last 24 hours) at 05/19/15 [id number] Last data filed at 05/19/15 [id number]  Gross per 24 hour  Intake 1236.16 ml  Output   2000 ml  Net -763.84 ml    Physical Exam: Affect appropriate Healthy:  appears stated age HEENT: normal Neck supple with no adenopathy JVP normal no bruits no thyromegaly Lungs clear with no wheezing and good diaphragmatic motion Heart:  S1/S2 no murmur, no rub, gallop or click Previous sternotomy  PMI normal Abdomen: benighn, BS positve, no tenderness, no AAA no bruit.  No HSM or HJR Distal pulses intact with no bruits No edema Neuro non-focal Skin warm and dry No muscular weakness   Lab Results: Basic Metabolic Panel:  Recent Labs  05/18/15 1437 05/19/15 0340  NA 140 142  K 4.8 4.1  CL 108 110  CO2 27 25  GLUCOSE 133* 111*  BUN 28* 21*  CREATININE 1.72* 1.50*  CALCIUM 9.4 8.9   CBC:  Recent Labs  05/18/15 1437 05/19/15 0340  WBC 6.6 5.6  HGB 10.2* 8.8*  HCT 33.4* 29.3*  MCV 84.1 84.2  PLT 361 278   Cardiac Enzymes:  Recent Labs  05/18/15 1437 05/18/15 2143 05/19/15 0340  TROPONINI <0.03 0.23* 0.40*    Imaging: Dg Chest 2 View  05/18/2015  CLINICAL DATA:  Chest pain. EXAM: CHEST  2 VIEW COMPARISON:  April 22, 2014. FINDINGS: The heart size and mediastinal contours are within normal limits. Both lungs are clear. No pneumothorax or pleural effusion is noted. Status post coronary artery bypass graft. Atherosclerosis of  descending thoracic aorta is noted. The visualized skeletal structures are unremarkable. IMPRESSION: No active cardiopulmonary disease. Electronically Signed   By: Marijo Conception, M.D.   On: 05/18/2015 15:30    Cardiac Studies:  ECG:    Telemetry:  NSR  05/19/2015   Echo:   04/2014    Study Conclusions  - Left ventricle: E/e&'>14.5 suggestive of elevated LV filling pressures. The cavity size was normal. There was mild focal basal hypertrophy of the septum. Systolic function was normal. The estimated ejection fraction was in the range of 55% to 60%. There is akinesis of the basal-midinferolateral myocardium. - Aortic valve: Mildly calcified leaflets. There was trivial regurgitation. - Mitral valve: There was mild regurgitation. - Atrial septum: There was increased thickness of the septum, consistent with lipomatous hypertrophy. - Pulmonary arteries: PA peak pressure: 44 mm Hg (S).  Medications:   . aspirin EC  81 mg Oral Daily  . isosorbide mononitrate  30 mg Oral Daily  . metoprolol tartrate  25 mg Oral BID  . simvastatin  20 mg Oral Daily     . sodium chloride 100 mL/hr at 05/18/15 2213  . heparin 1,000 Units/hr (05/19/15 0010)    Assessment/Plan:  SEMI:  Distant CABG 2001 with stents to  SVG Om and RCA already most recently 04/2014.  Diagnostic cath today.  Continue heparin nitrates and beta blocker Anemia:  Had negative colonoscopy 1.5 years ago.  Has seen Rehman and was scheduled to have EGD 12/15.  No PUD symptoms.  Had "black": stools 3 weeks Ago but this has cleared.  Consider diagnostic cath today and if intervention needed consider postponing and doing EGD in house with GI consult post cath.  Will Start iv pepcid ? Allergy to protonix Chol:  On statin   Jenkins Rouge 05/19/2015, 9:37 AM

## 2015-05-20 ENCOUNTER — Encounter: Payer: Self-pay | Admitting: *Deleted

## 2015-05-20 ENCOUNTER — Encounter (HOSPITAL_COMMUNITY)
Admission: EM | Disposition: A | Discharge status: Home / Self Care | Payer: Self-pay | Source: Home / Self Care | Attending: Cardiology

## 2015-05-20 ENCOUNTER — Encounter (HOSPITAL_COMMUNITY): Payer: Self-pay | Admitting: *Deleted

## 2015-05-20 ENCOUNTER — Other Ambulatory Visit: Payer: Self-pay | Admitting: *Deleted

## 2015-05-20 DIAGNOSIS — Z006 Encounter for examination for normal comparison and control in clinical research program: Secondary | ICD-10-CM

## 2015-05-20 DIAGNOSIS — I208 Other forms of angina pectoris: Secondary | ICD-10-CM

## 2015-05-20 DIAGNOSIS — I214 Non-ST elevation (NSTEMI) myocardial infarction: Secondary | ICD-10-CM

## 2015-05-20 HISTORY — PX: ESOPHAGOGASTRODUODENOSCOPY: SHX5428

## 2015-05-20 LAB — BASIC METABOLIC PANEL
ANION GAP: 9 (ref 5–15)
BUN: 18 mg/dL (ref 6–20)
CHLORIDE: 106 mmol/L (ref 101–111)
CO2: 22 mmol/L (ref 22–32)
Calcium: 9.3 mg/dL (ref 8.9–10.3)
Creatinine, Ser: 1.19 mg/dL (ref 0.61–1.24)
GFR calc Af Amer: >60 mL/min (ref >60)
GFR, EST NON AFRICAN AMERICAN: 57 mL/min — AB (ref >60)
GLUCOSE: 121 mg/dL — AB (ref 65–99)
POTASSIUM: 3.9 mmol/L (ref 3.5–5.1)
Sodium: 137 mmol/L (ref 135–145)

## 2015-05-20 LAB — CBC
HEMATOCRIT: 31.2 % — AB (ref 39.0–52.0)
HEMOGLOBIN: 9.5 g/dL — AB (ref 13.0–17.0)
MCH: 25.5 pg — AB (ref 26.0–34.0)
MCHC: 30.4 g/dL (ref 30.0–36.0)
MCV: 83.6 fL (ref 78.0–100.0)
Platelets: 356 10*3/uL (ref 150–400)
RBC: 3.73 MIL/uL — ABNORMAL LOW (ref 4.22–5.81)
RDW: 13.9 % (ref 11.5–15.5)
WBC: 7.2 10*3/uL (ref 4.0–10.5)

## 2015-05-20 SURGERY — EGD (ESOPHAGOGASTRODUODENOSCOPY)
Anesthesia: Moderate Sedation

## 2015-05-20 MED ORDER — FENTANYL CITRATE (PF) 100 MCG/2ML IJ SOLN
INTRAMUSCULAR | Status: DC | PRN
  Administered 2015-05-20 (×2): 25 ug via INTRAVENOUS

## 2015-05-20 MED ORDER — DIPHENHYDRAMINE HCL 50 MG/ML IJ SOLN
INTRAMUSCULAR | Status: AC
  Filled 2015-05-20: qty 1

## 2015-05-20 MED ORDER — ACETAMINOPHEN 325 MG PO TABS: 650.0000 mg | ORAL_TABLET | ORAL | Status: DC | PRN

## 2015-05-20 MED ORDER — FAMOTIDINE 20 MG PO TABS: 20.0000 mg | ORAL_TABLET | Freq: Two times a day (BID) | ORAL | Status: DC

## 2015-05-20 MED ORDER — BUTAMBEN-TETRACAINE-BENZOCAINE 2-2-14 % EX AERO
INHALATION_SPRAY | CUTANEOUS | Status: DC | PRN
  Administered 2015-05-20: 2 via TOPICAL

## 2015-05-20 MED ORDER — FENTANYL CITRATE (PF) 100 MCG/2ML IJ SOLN
INTRAMUSCULAR | Status: AC
  Filled 2015-05-20: qty 2

## 2015-05-20 MED ORDER — MIDAZOLAM HCL 5 MG/ML IJ SOLN
INTRAMUSCULAR | Status: AC
  Filled 2015-05-20: qty 2

## 2015-05-20 MED ORDER — AMBULATORY NON FORMULARY MEDICATION: 90.0000 mg | Freq: Two times a day (BID) | Status: DC

## 2015-05-20 MED ORDER — MIDAZOLAM HCL 10 MG/2ML IJ SOLN
INTRAMUSCULAR | Status: DC | PRN
  Administered 2015-05-20 (×2): 2 mg via INTRAVENOUS

## 2015-05-20 MED ORDER — TICAGRELOR 90 MG PO TABS: 90.0000 mg | ORAL_TABLET | Freq: Two times a day (BID) | ORAL | Status: DC

## 2015-05-20 MED ORDER — AMBULATORY NON FORMULARY MEDICATION: 81.0000 mg | Freq: Every day | Status: DC

## 2015-05-20 NOTE — Interval H&P Note (Signed)
History and Physical Interval Note:  05/20/2015 7:36 AM  Billy [name]  has presented today for surgery, with the diagnosis of anemia  The various methods of treatment have been discussed with the patient and family. After consideration of risks, benefits and other options for treatment, the patient has consented to  Procedure(s): ESOPHAGOGASTRODUODENOSCOPY (EGD) (N/A) as a surgical intervention .  The patient's history has been reviewed, patient examined, no change in status, stable for surgery.  I have reviewed the patient's chart and labs.  Questions were answered to the patient's satisfaction.     Goshen C.

## 2015-05-20 NOTE — Progress Notes (Signed)
CARDIAC REHAB PHASE I   PRE:  Rate/Rhythm: 72 SR  BP:  Supine: 157/75  Sitting:   Standing:    SaO2:   MODE:  Ambulation: 500 ft   POST:  Rate/Rhythm: 98 SR PVCs  BP:  Supine: 179/77  Sitting:   Standing:    SaO2:  0905-1000 Pt walked 500 ft with steady gait. No CP. Tolerated well. Education completed with pt and wife who voiced understanding. I saw them last admission and did ed then. Asked what questions they may have and reviewed diet, ex ed, NTG use and purpose of brilinta with stent. Encouraged pt to consider attending CRP 2 [place]. Did not do last time due to cost. Insurance to change in January and wife not sure if coverage the same. Encouraged to do if insurance covers.   Graylon Good, RN BSN  05/20/2015 10:00 AM

## 2015-05-20 NOTE — Discharge Instructions (Addendum)
Coronary Angiogram With Stent, Care After Refer to this sheet in the next few weeks. These instructions provide you with information about caring for yourself after your procedure. Your health care provider may also give you more specific instructions. Your treatment has been planned according to current medical practices, but problems sometimes occur. Call your health care provider if you have any problems or questions after your procedure. WHAT TO EXPECT AFTER THE PROCEDURE  After your procedure, it is typical to have the following:  Bruising at the catheter insertion site that usually fades within 1-2 weeks.  Blood collecting in the tissue (hematoma) that may be painful to the touch. It should usually decrease in size and tenderness within 1-2 weeks. HOME CARE INSTRUCTIONS  Take medicines only as directed by your health care provider. Blood thinners may be prescribed after your procedure to improve blood flow through the stent.  You may shower 24-48 hours after the procedure or as directed by your health care provider. Remove the bandage (dressing) and gently wash the catheter insertion site with plain soap and water. Pat the area dry with a clean towel. Do not rub the site, because this may cause bleeding.  Do not take baths, swim, or use a hot tub until your health care provider approves.  Check your catheter insertion site every day for redness, swelling, or drainage.  Do not apply powder or lotion to the site.  Do not lift over 10 lb (4.5 kg) for 5 days after your procedure or as directed by your health care provider.  Ask your health care provider when it is okay to:  Return to work or school.  Resume usual physical activities or sports.  Resume sexual activity.  Eat a heart-healthy diet. This should include plenty of fresh fruits and vegetables. Meat should be lean cuts. Avoid the following types of food:  Food that is high in salt.  Canned or highly processed food.  Food  that is high in saturated fat or sugar.  Fried food.  Make any other lifestyle changes as recommended by your health care provider. These may include:  Not using any tobacco products, including cigarettes, chewing tobacco, or electronic cigarettes.If you need help quitting, ask your health care provider.  Managing your weight.  Getting regular exercise.  Managing your blood pressure.  Limiting your alcohol intake.  Managing other health problems, such as diabetes.  If you need an MRI after your heart stent has been placed, be sure to tell the health care provider who orders the MRI that you have a heart stent.  Keep all follow-up visits as directed by your health care provider. This is important. SEEK MEDICAL CARE IF:  You have a fever.  You have chills.  You have increased bleeding from the catheter insertion site. Hold pressure on the site. SEEK IMMEDIATE MEDICAL CARE IF:  You develop chest pain or shortness of breath, feel faint, or pass out.  You have unusual pain at the catheter insertion site.  You have redness, warmth, or swelling at the catheter insertion site.  You have drainage (other than a small amount of blood on the dressing) from the catheter insertion site.  The catheter insertion site is bleeding, and the bleeding does not stop after 30 minutes of holding steady pressure on the site.  You develop bleeding from any other place, such as from your rectum. There may be bright red blood in your urine or stool, or it may appear as black, tarry stool.  This information is not intended to replace advice given to you by your health care provider. Make sure you discuss any questions you have with your health care provider.   Document Released: 12/11/2004 Document Revised: 06/14/2014 Document Reviewed: 10/16/2012 Elsevier Interactive Patient Education 2016 [place] FOLLOWING MEDICATIONS:  PROTONIX (PANT0PRAZOLE) SAW PALMETTO ALL NSAIDS  (NON STEROIDAL ANTI INFLAMATORY DRUGS), SUCH AS MOTRIN, IBUPROFEN, ALEEVE, AND ADVIL

## 2015-05-20 NOTE — Op Note (Signed)
[facility] [place], [address]   ENDOSCOPY PROCEDURE REPORT  PATIENT: Billy [name], Billy [name]  MR#: [id number] BIRTHDATE: [birth year] , 31  yrs. old GENDER: male ENDOSCOPIST: Wilford Corner, MD REFERRED BY:  Jenkins Rouge, M.D. PROCEDURE DATE:  05/20/2015 PROCEDURE:  EGD, diagnostic ASA CLASS:     Class III INDICATIONS:  anemia. MEDICATIONS: Fentanyl 50 mcg IV and Versed 4 mg IV TOPICAL ANESTHETIC: Cetacaine Spray  DESCRIPTION OF PROCEDURE: After the risks benefits and alternatives of the procedure were thoroughly explained, informed consent was obtained.  The Pentax Gastroscope [id number] endoscope was introduced through the mouth and advanced to the second portion of the duodenum , Without limitations.  The instrument was slowly withdrawn as the mucosa was fully examined. Estimated blood loss is zero unless otherwise noted in this procedure report.    Esophagus normal. GEJ 42 cm from the incisors and normal. Small portion of proximal stomach unable to visualize due to food that could not be aspirated but remaining stomach normal in appearance. No blood products seen. Duodenal bulb and 2nd portion of the duodenum normal in appearance.       Retroflexed views revealed no abnormalities.     The scope was then withdrawn from the patient and the procedure completed.  COMPLICATIONS: There were no immediate complications.  ENDOSCOPIC IMPRESSION:     Normal EGD (no source of anemia seen)   RECOMMENDATIONS:     No further GI workup needed at this time; Supportive care   eSigned:  Wilford Corner, MD 05/20/2015 8:15 AM    [id number] Tommy Rainwater, MD  CPT CODES: ICD CODES:  The ICD and CPT codes recommended by this software are interpretations from the data that the clinical staff has captured with the software.  The verification of the translation of this report to the ICD and CPT codes and modifiers is the sole responsibility of the health  care institution and practicing physician where this report was generated.  [employer] will not be held responsible for the validity of the ICD and CPT codes included on this report.  AMA assumes no liability for data contained or not contained herein. CPT is a Designer, television/film set of the [employer].  PATIENT NAME:  [name], [name] MR#: [id number]

## 2015-05-20 NOTE — Progress Notes (Signed)
Expand All Collapse All   TWILIGHT Research Study Informed Consent   Subject Name: Billy [name]  Subject met inclusion and exclusion criteria. The informed consent form, study requirements and expectations were reviewed with the subject and questions and concerns were addressed prior to the signing of the consent form. The subject verbalized understanding of the trial requirements. The subject agreed to participate in the [place] trial and signed the informed consent on 05/20/2015 @ 0915. The informed consent was obtained prior to performance of any protocol-specific procedures for the subject. A copy of the signed informed consent was given to the subject and a copy was placed in the subject's medical record.  Jake Bathe, RN 05/20/2015 [phone]

## 2015-05-20 NOTE — Progress Notes (Signed)
Patient ID: Billy [name], male   DOB: Jun 27, 1934, 80 y.o.   MRN: [id number]    Subjective:  No chest pain seen in endo.  Reivewed films with JB and no need for circumflex intervention  Objective:  Filed Vitals:   05/20/15 0745 05/20/15 0750 05/20/15 0755 05/20/15 0806  BP: 110/48  127/44 106/29  Pulse: 69 74 69 65  Temp:      TempSrc:      Resp: 13 20 13 12   Height:      Weight:      SpO2: 97% 96% 98% 96%    Intake/Output from previous day:  Intake/Output Summary (Last 24 hours) at 05/20/15 [id number] Last data filed at 05/20/15 0600  Gross per 24 hour  Intake 1864.72 ml  Output   1575 ml  Net 289.72 ml    Physical Exam: Affect appropriate Healthy:  appears stated age HEENT: normal Neck supple with no adenopathy JVP normal no bruits no thyromegaly Lungs clear with no wheezing and good diaphragmatic motion Heart:  S1/S2 no murmur, no rub, gallop or click Previous sternotomy  PMI normal Abdomen: benighn, BS positve, no tenderness, no AAA no bruit.  No HSM or HJR Distal pulses intact with no bruits No edema Neuro non-focal Skin warm and dry No muscular weakness   Lab Results: Basic Metabolic Panel:  Recent Labs  05/19/15 0340 05/20/15 0400  NA 142 137  K 4.1 3.9  CL 110 106  CO2 25 22  GLUCOSE 111* 121*  BUN 21* 18  CREATININE 1.50* 1.19  CALCIUM 8.9 9.3   CBC:  Recent Labs  05/19/15 0340 05/20/15 0400  WBC 5.6 7.2  HGB 8.8* 9.5*  HCT 29.3* 31.2*  MCV 84.2 83.6  PLT 278 356   Cardiac Enzymes:  Recent Labs  05/18/15 1437 05/18/15 2143 05/19/15 0340  TROPONINI <0.03 0.23* 0.40*    Imaging: Dg Chest 2 View  05/18/2015  CLINICAL DATA:  Chest pain. EXAM: CHEST  2 VIEW COMPARISON:  April 22, 2014. FINDINGS: The heart size and mediastinal contours are within normal limits. Both lungs are clear. No pneumothorax or pleural effusion is noted. Status post coronary artery bypass graft. Atherosclerosis of descending thoracic aorta is noted. The  visualized skeletal structures are unremarkable. IMPRESSION: No active cardiopulmonary disease. Electronically Signed   By: Marijo Conception, M.D.   On: 05/18/2015 15:30    Cardiac Studies:  ECG:    Telemetry:  NSR  05/20/2015   Echo:   04/2014    Study Conclusions  - Left ventricle: E/e&'>14.5 suggestive of elevated LV filling pressures. The cavity size was normal. There was mild focal basal hypertrophy of the septum. Systolic function was normal. The estimated ejection fraction was in the range of 55% to 60%. There is akinesis of the basal-midinferolateral myocardium. - Aortic valve: Mildly calcified leaflets. There was trivial regurgitation. - Mitral valve: There was mild regurgitation. - Atrial septum: There was increased thickness of the septum, consistent with lipomatous hypertrophy. - Pulmonary arteries: PA peak pressure: 44 mm Hg (S).  Medications:   . [MAR Hold] aspirin EC  81 mg Oral Daily  . [MAR Hold] famotidine (PEPCID) IV  20 mg Intravenous Q12H  . [MAR Hold] isosorbide mononitrate  30 mg Oral Daily  . [MAR Hold] metoprolol tartrate  25 mg Oral BID  . [MAR Hold] simvastatin  20 mg Oral Daily  . [MAR Hold] ticagrelor  90 mg Oral BID     . sodium chloride 20 mL/hr  at 05/20/15 0600    Assessment/Plan:  SEMI:  Distant CABG 2001 with stents to SVG Om and RCA already most recently 04/2014. Repeat intervention to SVG -PDA yesterday.  Continue DAT Anemia:  Had negative colonoscopy 1.5 years ago.  Has seen Rehman and was scheduled to have EGD 12/15.  No PUD symptoms.  Had "black": stools 3 weeks Ago but this has cleared.  EGD this am with no active ulcer or lesion.   Chol:  On statin   Ok for d/c today f/u Laren Everts 05/20/2015, 8:21 AM

## 2015-05-20 NOTE — Discharge Summary (Signed)
Patient ID: Billy [name],  MRN: [id number], DOB/AGE: 1934-07-11 80 y.o.  Admit date: 05/18/2015 Discharge date: 05/20/2015  Primary Care Provider: Curlene Labrum, MD Primary Cardiologist: Dr Domenic Polite  Discharge Diagnoses Active Problems:   Essential hypertension, benign   CKD (chronic kidney disease) stage 3, GFR 30-59 ml/min   Anemia   Unstable angina (HCC)   Angina at rest [facility])   NSTEMI (non-ST elevated myocardial infarction) [facility])    Procedures: Coronary angiogram and SVG-PDA DES 05/19/15                        Endoscopy 05/20/15   Hospital Course:  80 year old Caucasian male with history of CAD status post bypass grafting in 2001. His other problems include a history of hypertension and hyperlipidemia. He had staged PCI of his circumflex obtuse marginal graft vein graft followed by his RCA vein graft in November of 2015 with drug-eluting stents in the setting of a non-STEMI. He was admitted on 05/18/15 with unstable angina. He had mildly elevated enzymes. He had moderate renal insufficiency. He was also anemic with question of prior GI bleed. He was on dual antiplatelet therapy. He underwent coronary and vein graft angiogram and :successful PCI and stenting of the distal PDA SVG just proximal to the previous placed stent with a drug-eluting stent. Patient has moderate disease in the circumflex SVG just proximal to the previously stented segment however this did not appear to be obstructive. He underwent endoscopy 05/20/15 before discharge for a history of anemia and previous "black stool". Endoscopy was unremarkable for PUD or gastritis. Dr Johnsie Cancel feels he can discharge 05/20/15. He has follow up arranged with Dr Domenic Polite in [place].    Discharge Vitals:  Blood pressure 157/75, pulse 73, temperature 97.7 F (36.5 C), temperature source Oral, resp. rate 16, height 5\' 7"  (1.702 m), weight 141 lb (63.957 kg), SpO2 96 %.    Labs: Results for orders placed or performed during  the hospital encounter of 05/18/15 (from the past 24 hour(s))  POCT Activated clotting time     Status: None   Collection Time: 05/19/15  1:23 PM  Result Value Ref Range   Activated Clotting Time 322 seconds  Basic metabolic panel     Status: Abnormal   Collection Time: 05/20/15  4:00 AM  Result Value Ref Range   Sodium 137 135 - 145 mmol/L   Potassium 3.9 3.5 - 5.1 mmol/L   Chloride 106 101 - 111 mmol/L   CO2 22 22 - 32 mmol/L   Glucose, Bld 121 (H) 65 - 99 mg/dL   BUN 18 6 - 20 mg/dL   Creatinine, Ser 1.19 0.61 - 1.24 mg/dL   Calcium 9.3 8.9 - 10.3 mg/dL   GFR calc non Af Amer 57 (L) >60 mL/min   GFR calc Af Amer >60 >60 mL/min   Anion gap 9 5 - 15  CBC     Status: Abnormal   Collection Time: 05/20/15  4:00 AM  Result Value Ref Range   WBC 7.2 4.0 - 10.5 K/uL   RBC 3.73 (L) 4.22 - 5.81 MIL/uL   Hemoglobin 9.5 (L) 13.0 - 17.0 g/dL   HCT 31.2 (L) 39.0 - 52.0 %   MCV 83.6 78.0 - 100.0 fL   MCH 25.5 (L) 26.0 - 34.0 pg   MCHC 30.4 30.0 - 36.0 g/dL   RDW 13.9 11.5 - 15.5 %   Platelets 356 150 - 400 K/uL  Disposition:  Follow-up Information    Follow up with Rozann Lesches, MD On 07/02/2015.   Specialty:  Cardiology   Why:  10:20   Contact information:   [place] [address] [phone]       Discharge Medications:    Medication List    STOP taking these medications        pantoprazole 40 MG tablet  Commonly known as:  PROTONIX     Saw Palmetto (Serenoa repens) 450 MG Caps      TAKE these medications        acetaminophen 325 MG tablet  Commonly known as:  TYLENOL  Take 2 tablets (650 mg total) by mouth every 4 (four) hours as needed for headache or mild pain.     aspirin EC 81 MG tablet  Take 81 mg by mouth daily.     COLCRYS 0.6 MG tablet  Generic drug:  colchicine  Take 1 tablet by mouth Once daily as needed (for gout).     famotidine 20 MG tablet  Commonly known as:  PEPCID  Take 1 tablet (20 mg total) by mouth 2 (two) times  daily.     fluticasone 50 MCG/ACT nasal spray  Commonly known as:  FLONASE  Place 1-2 sprays into both nostrils daily as needed.     isosorbide mononitrate 60 MG 24 hr tablet  Commonly known as:  IMDUR  Take 30 mg by mouth daily.     metoprolol tartrate 25 MG tablet  Commonly known as:  LOPRESSOR  Take 1 tablet (25 mg total) by mouth 2 (two) times daily.     nitroGLYCERIN 0.4 MG SL tablet  Commonly known as:  NITROSTAT  Place 1 tablet (0.4 mg total) under the tongue every 5 (five) minutes as needed.     simvastatin 40 MG tablet  Commonly known as:  ZOCOR  Take 0.5 tablets (20 mg total) by mouth daily.     ticagrelor 90 MG Tabs tablet  Commonly known as:  BRILINTA  Take 1 tablet (90 mg total) by mouth 2 (two) times daily.         Duration of Discharge Encounter: Greater than 30 minutes including physician time.  Angelena Form PA-C 05/20/2015 11:10 AM

## 2015-05-20 NOTE — H&P (View-Only) (Signed)
Referring Provider: Dr. Johnsie Cancel Primary Care Physician:  Curlene Labrum, MD Primary Gastroenterologist:  Althia Forts  Reason for Consultation:  Anemia  HPI: Billy [name] is a 80 y.o. male with onset of loose black stools 2 weeks ago that have resolved who was admitted for an NSTEMI and is s/p cardiac cath today. He denies seeing any black stools in the past 1-2 weeks. Denies hematochezia. Denies abdominal pain, nausea, vomiting, dizziness. On Aspirin 81 mg/day but otherwise denies NSAIDs. Wife reports that he was on Brilinta until the end of November. Wife reports normal colonoscopy 1.5 years ago. Denies ever having an EGD. Denies hematochezia or hematemesis. Hgb 8.8 (10.2 yesterday).  Past Medical History  Diagnosis Date  . Coronary atherosclerosis of native coronary artery     Multivessel status post CABG, DES x 2 SVG to OM with staged DES SVG to RCA November 2015  . Essential hypertension, benign   . Carotid artery disease ([place])     123456 LICA and [id number] RICA - December 2013  . Sinus bradycardia     Asymptomatic  . Gout   . HOH (hard of hearing)     Past Surgical History  Procedure Laterality Date  . Coronary artery bypass graft  11/09/1999    LIMA to LAD, SVG to diagonal, SVG to OM1 and OM2, SVG to PDA  . Appendectomy      Ruptured  . Cataract extraction w/phaco Right 11/13/2012    Procedure: CATARACT EXTRACTION PHACO AND INTRAOCULAR LENS PLACEMENT (IOC);  Surgeon: Tonny Branch, MD;  Location: [facility];  Service: Ophthalmology;  Laterality: Right;  CDE:12.75  . Cataract extraction w/phaco Left 01/11/2013    Procedure: CATARACT EXTRACTION PHACO AND INTRAOCULAR LENS PLACEMENT (IOC);  Surgeon: Tonny Branch, MD;  Location: [facility];  Service: Ophthalmology;  Laterality: Left;  CDE: 21.13  . Left heart catheterization with coronary angiogram N/A 04/22/2014    Procedure: LEFT HEART CATHETERIZATION WITH CORONARY ANGIOGRAM;  Surgeon: Burnell Blanks, MD;  Location: [facility] CATH LAB;  Service:  Cardiovascular;  Laterality: N/A;  . Percutaneous coronary stent intervention (pci-s) N/A 04/25/2014    Procedure: PERCUTANEOUS CORONARY STENT INTERVENTION (PCI-S);  Surgeon: Peter M Martinique, MD;  Location: [facility] CATH LAB;  Service: Cardiovascular;  Laterality: N/A;  . Colonoscopy  09/2013    Dr. Burke Keels: colitis descending colon and sigmoid colon.Bx ?Cdiff vs ischemic.  . Cardiac catheterization N/A 05/19/2015    Procedure: Left Heart Cath and Cors/Grafts Angiography;  Surgeon: Lorretta Harp, MD;  Location: [place] CV LAB;  Service: Cardiovascular;  Laterality: N/A;  . Cardiac catheterization N/A 05/19/2015    Procedure: Coronary Stent Intervention;  Surgeon: Lorretta Harp, MD;  Location: [place] CV LAB;  Service: Cardiovascular;  Laterality: N/A;    Prior to Admission medications   Medication Sig Start Date End Date Taking? Authorizing Provider  aspirin EC 81 MG tablet Take 81 mg by mouth daily.     Yes Historical Provider, MD  COLCRYS 0.6 MG tablet Take 1 tablet by mouth Once daily as needed (for gout).  03/05/12  Yes Historical Provider, MD  isosorbide mononitrate (IMDUR) 60 MG 24 hr tablet Take 30 mg by mouth daily.   Yes Historical Provider, MD  metoprolol tartrate (LOPRESSOR) 25 MG tablet Take 1 tablet (25 mg total) by mouth 2 (two) times daily. 04/07/15  Yes Satira Sark, MD  nitroGLYCERIN (NITROSTAT) 0.4 MG SL tablet Place 1 tablet (0.4 mg total) under the tongue every 5 (five) minutes as needed.  06/15/13  Yes Satira Sark, MD  Saw Palmetto, Serenoa repens, 450 MG CAPS Take by mouth. Take 1 capsule in AM and 2 capsules in PM   Yes Historical Provider, MD  simvastatin (ZOCOR) 40 MG tablet Take 0.5 tablets (20 mg total) by mouth daily. 12/02/14  Yes Satira Sark, MD  fluticasone (FLONASE) 50 MCG/ACT nasal spray Place 1-2 sprays into both nostrils daily as needed. 04/04/15   Historical Provider, MD  pantoprazole (PROTONIX) 40 MG tablet Take 1 tablet (40 mg total)  by mouth daily before breakfast. Patient not taking: Reported on 05/18/2015 03/19/15   Mahala Menghini, PA-C    Scheduled Meds: . aspirin EC  81 mg Oral Daily  . famotidine (PEPCID) IV  20 mg Intravenous Q12H  . isosorbide mononitrate  30 mg Oral Daily  . metoprolol tartrate  25 mg Oral BID  . simvastatin  20 mg Oral Daily  . ticagrelor  90 mg Oral BID   Continuous Infusions: . sodium chloride    . sodium chloride 3 mL/kg/hr (05/19/15 1400)   PRN Meds:.acetaminophen, hydrALAZINE, morphine injection, nitroGLYCERIN, ondansetron (ZOFRAN) IV  Allergies as of 05/18/2015 - Review Complete 05/18/2015  Allergen Reaction Noted  . Protonix [pantoprazole sodium] Itching and Rash 05/18/2015    Family History  Problem Relation Age of Onset  . Cancer Father     colon, age 11s    Social History   Social History  . Marital Status: Married    Spouse Name: N/A  . Number of Children: N/A  . Years of Education: N/A   Occupational History  . DISABLED     [employer]   Social History Main Topics  . Smoking status: Former Smoker -- 0.50 packs/day for 50 years    Types: Cigarettes    Start date: 01/08/1947    Quit date: 06/08/1999  . Smokeless tobacco: Never Used  . Alcohol Use: No  . Drug Use: No  . Sexual Activity: Yes    Birth Control/ Protection: None   Other Topics Concern  . Not on file   Social History Narrative   He lives in [place] with his wife.    Review of Systems: All negative except as stated above in HPI.  Physical Exam: Vital signs: Filed Vitals:   05/19/15 1545 05/19/15 1600  BP: 194/84 134/72  Pulse: 70 64  Temp:    Resp: 20 10   Last BM Date: 05/18/15 General:   Alert,  Well-developed, well-nourished, pleasant and cooperative in NAD, elderly Head: atraumatic Eyes: pupils equal and reactive, anicteric sclera ENT: oropharynx clear Neck: supple, nontender Lungs:  Clear throughout to auscultation.   No wheezes, crackles, or rhonchi. No  acute distress. Heart:  Regular rate and rhythm; no murmurs, clicks, rubs,  or gallops. Abdomen: soft, nontender, nondistended, +BS  Rectal:  Deferred Ext: no edema Psych: normal mood, affect  GI:  Lab Results:  Recent Labs  05/18/15 1437 05/19/15 0340  WBC 6.6 5.6  HGB 10.2* 8.8*  HCT 33.4* 29.3*  PLT 361 278   BMET  Recent Labs  05/18/15 1437 05/19/15 0340  NA 140 142  K 4.8 4.1  CL 108 110  CO2 27 25  GLUCOSE 133* 111*  BUN 28* 21*  CREATININE 1.72* 1.50*  CALCIUM 9.4 8.9   LFT No results for input(s): PROT, ALBUMIN, AST, ALT, ALKPHOS, BILITOT, BILIDIR, IBILI in the last 72 hours. PT/INR  Recent Labs  05/19/15 0340  LABPROT 15.5*  INR 1.21  Studies/Results: Dg Chest 2 View  05/18/2015  CLINICAL DATA:  Chest pain. EXAM: CHEST  2 VIEW COMPARISON:  April 22, 2014. FINDINGS: The heart size and mediastinal contours are within normal limits. Both lungs are clear. No pneumothorax or pleural effusion is noted. Status post coronary artery bypass graft. Atherosclerosis of descending thoracic aorta is noted. The visualized skeletal structures are unremarkable. IMPRESSION: No active cardiopulmonary disease. Electronically Signed   By: Marijo Conception, M.D.   On: 05/18/2015 15:30    Impression/Plan: 80 yo with anemia and recent NSTEMI s/p cardiac cath today in need of an EGD to look for peptic ulcer disease. Allergy to Protonix. Keep on IV Pepcid pending EGD results. Patient on Brilinta. Supportive care. Follow H/Hs.   LOS: 1 day   Devers C.  05/19/2015, 4:05 PM  Pager [phone]  If no answer or after 5 PM call [phone]

## 2015-05-21 ENCOUNTER — Encounter (HOSPITAL_COMMUNITY): Payer: Self-pay | Admitting: Gastroenterology

## 2015-05-23 ENCOUNTER — Encounter (HOSPITAL_COMMUNITY): Payer: Self-pay

## 2015-05-23 ENCOUNTER — Ambulatory Visit (HOSPITAL_COMMUNITY): Admit: 2015-05-23 | Payer: Commercial Managed Care - HMO | Admitting: Gastroenterology

## 2015-05-23 SURGERY — EGD (ESOPHAGOGASTRODUODENOSCOPY)
Anesthesia: Moderate Sedation

## 2015-06-08 DIAGNOSIS — I219 Acute myocardial infarction, unspecified: Secondary | ICD-10-CM

## 2015-06-08 DIAGNOSIS — Z9289 Personal history of other medical treatment: Secondary | ICD-10-CM

## 2015-06-08 HISTORY — DX: Personal history of other medical treatment: Z92.89

## 2015-06-08 HISTORY — DX: Acute myocardial infarction, unspecified: I21.9

## 2015-06-10 ENCOUNTER — Other Ambulatory Visit: Payer: Self-pay | Admitting: Cardiology

## 2015-06-10 ENCOUNTER — Other Ambulatory Visit: Payer: Self-pay | Admitting: *Deleted

## 2015-06-10 DIAGNOSIS — I6523 Occlusion and stenosis of bilateral carotid arteries: Secondary | ICD-10-CM

## 2015-06-10 MED ORDER — NITROGLYCERIN 0.4 MG SL SUBL: 0.4000 mg | SUBLINGUAL_TABLET | SUBLINGUAL | Status: DC | PRN

## 2015-06-10 MED ORDER — ISOSORBIDE MONONITRATE ER 60 MG PO TB24: 30.0000 mg | ORAL_TABLET | Freq: Every day | ORAL | Status: DC

## 2015-06-18 ENCOUNTER — Encounter: Payer: Self-pay | Admitting: *Deleted

## 2015-06-18 DIAGNOSIS — Z006 Encounter for examination for normal comparison and control in clinical research program: Secondary | ICD-10-CM

## 2015-06-18 NOTE — Progress Notes (Signed)
TWILIGHT Research Study 1 month telephone follow up completed. Patient denies any bleeding events currently sick with bronchitis. States compliant with Brilinta and ASA. Questions encouraged and answered. 3 month Randomization visit due beginning 08/04/2015, research office call to schedule.

## 2015-07-02 ENCOUNTER — Ambulatory Visit: Payer: Commercial Managed Care - HMO | Admitting: Cardiology

## 2015-07-16 ENCOUNTER — Telehealth: Payer: Self-pay | Admitting: Cardiology

## 2015-07-16 ENCOUNTER — Ambulatory Visit: Payer: Medicare Other

## 2015-07-16 DIAGNOSIS — I6523 Occlusion and stenosis of bilateral carotid arteries: Secondary | ICD-10-CM

## 2015-07-16 NOTE — Telephone Encounter (Signed)
I need an ASAP precert done on [id number] he changed insurance and just told us at check in   Thank you

## 2015-07-18 ENCOUNTER — Encounter: Payer: Self-pay | Admitting: Cardiology

## 2015-07-18 ENCOUNTER — Ambulatory Visit (INDEPENDENT_AMBULATORY_CARE_PROVIDER_SITE_OTHER): Payer: Medicare Other | Admitting: Cardiology

## 2015-07-18 VITALS — BP 152/71 | HR 56 | Ht 67.0 in | Wt 138.4 lb

## 2015-07-18 DIAGNOSIS — I739 Peripheral vascular disease, unspecified: Secondary | ICD-10-CM

## 2015-07-18 DIAGNOSIS — I10 Essential (primary) hypertension: Secondary | ICD-10-CM

## 2015-07-18 DIAGNOSIS — I25810 Atherosclerosis of coronary artery bypass graft(s) without angina pectoris: Secondary | ICD-10-CM

## 2015-07-18 DIAGNOSIS — I779 Disorder of arteries and arterioles, unspecified: Secondary | ICD-10-CM | POA: Diagnosis not present

## 2015-07-18 DIAGNOSIS — E782 Mixed hyperlipidemia: Secondary | ICD-10-CM | POA: Diagnosis not present

## 2015-07-18 DIAGNOSIS — R252 Cramp and spasm: Secondary | ICD-10-CM

## 2015-07-18 MED ORDER — ROSUVASTATIN CALCIUM 5 MG PO TABS: 5.0000 mg | ORAL_TABLET | Freq: Every day | ORAL | Status: DC

## 2015-07-18 NOTE — Progress Notes (Signed)
Cardiology Office Note  Date: 07/18/2015   ID: Billy [name], DOB 1935/03/03, MRN [id number]  PCP: Curlene Labrum, MD  Primary Cardiologist: Rozann Lesches, MD   Chief Complaint  Patient presents with  . Coronary Artery Disease    History of Present Illness: Billy [name] is a 81 y.o. male last seen in August 2016. I reviewed interval records and updated his chart. He was admitted to [facility] in December 2016 with ACS, troponin I peak at 0.40. He underwent cardiac catheterization which revealed a new stenosis in the SVG to PDA just proximal to the previously placed stent, underwent implantation of new DES at that site. He also had disease within the SVG to circumflex, although this was moderate and managed medically. He is enrolled in the TWILIGHT study.  He comes in today for a follow-up visit. He states that he feels good overall, no angina symptoms whatsoever at this point. He has NYHA class II dyspnea. Enjoys doing woodwork in his shop. He has been having some trouble with leg cramping and came off of Zocor temporarily, just within the last week. He did not have any cramping last night and we plan to keep him off for another week or so to see if the symptoms completely resolve. If so we will try and switch to low-dose Crestor.  I reviewed his medications which are outlined below. He reports compliance with his medications including study drugs. He has not required any nitroglycerin.  I went over his recent carotid Dopplers are outlined below.  Blood pressure is mildly elevated today.  Past Medical History  Diagnosis Date  . Coronary atherosclerosis of native coronary artery     Multivessel status post CABG, DES x 2 SVG to OM with staged DES SVG to RCA November 2015, DES to SVG to PDA 05/2015  . Essential hypertension, benign   . Carotid artery disease ([place])     123456 LICA and [id number] RICA - December 2013  . Sinus bradycardia     Asymptomatic  . Gout   . HOH (hard of  hearing)     Past Surgical History  Procedure Laterality Date  . Coronary artery bypass graft  11/09/1999    LIMA to LAD, SVG to diagonal, SVG to OM1 and OM2, SVG to PDA  . Appendectomy      Ruptured  . Cataract extraction w/phaco Right 11/13/2012    Procedure: CATARACT EXTRACTION PHACO AND INTRAOCULAR LENS PLACEMENT (IOC);  Surgeon: Tonny Branch, MD;  Location: [facility];  Service: Ophthalmology;  Laterality: Right;  CDE:12.75  . Cataract extraction w/phaco Left 01/11/2013    Procedure: CATARACT EXTRACTION PHACO AND INTRAOCULAR LENS PLACEMENT (IOC);  Surgeon: Tonny Branch, MD;  Location: [facility];  Service: Ophthalmology;  Laterality: Left;  CDE: 21.13  . Left heart catheterization with coronary angiogram N/A 04/22/2014    Procedure: LEFT HEART CATHETERIZATION WITH CORONARY ANGIOGRAM;  Surgeon: Burnell Blanks, MD;  Location: [facility] CATH LAB;  Service: Cardiovascular;  Laterality: N/A;  . Percutaneous coronary stent intervention (pci-s) N/A 04/25/2014    Procedure: PERCUTANEOUS CORONARY STENT INTERVENTION (PCI-S);  Surgeon: Peter M Martinique, MD;  Location: [facility] CATH LAB;  Service: Cardiovascular;  Laterality: N/A;  . Colonoscopy  09/2013    Dr. Burke Keels: colitis descending colon and sigmoid colon.Bx ?Cdiff vs ischemic.  . Cardiac catheterization N/A 05/19/2015    Procedure: Left Heart Cath and Cors/Grafts Angiography;  Surgeon: Lorretta Harp, MD;  Location: [place] CV LAB;  Service: Cardiovascular;  Laterality: N/A;  . Cardiac catheterization N/A 05/19/2015    Procedure: Coronary Stent Intervention;  Surgeon: Lorretta Harp, MD;  Location: [place] CV LAB;  Service: Cardiovascular;  Laterality: N/A;  . Esophagogastroduodenoscopy N/A 05/20/2015    Procedure: ESOPHAGOGASTRODUODENOSCOPY (EGD);  Surgeon: Wilford Corner, MD;  Location: [facility] ENDOSCOPY;  Service: Endoscopy;  Laterality: N/A;    Current Outpatient Prescriptions  Medication Sig Dispense Refill  . acetaminophen (TYLENOL) 325 MG  tablet Take 2 tablets (650 mg total) by mouth every 4 (four) hours as needed for headache or mild pain.    Marland Kitchen AMBULATORY NON FORMULARY MEDICATION Take 90 mg by mouth 2 (two) times daily. Medication Name: Brilinta 90 mg BID (TWILIGHT Research study provided do not fill)    . AMBULATORY NON FORMULARY MEDICATION Take 81 mg by mouth daily. Medication Name: ASA 81 mg daily (TWILIGHT Research study provided)    . COLCRYS 0.6 MG tablet Take 1 tablet by mouth Once daily as needed (for gout).     . fluticasone (FLONASE) 50 MCG/ACT nasal spray Place 1-2 sprays into both nostrils daily as needed.    . isosorbide mononitrate (IMDUR) 60 MG 24 hr tablet Take 0.5 tablets (30 mg total) by mouth daily. 45 tablet 3  . metoprolol tartrate (LOPRESSOR) 25 MG tablet Take 1 tablet (25 mg total) by mouth 2 (two) times daily. 180 tablet 3  . nitroGLYCERIN (NITROSTAT) 0.4 MG SL tablet Place 1 tablet (0.4 mg total) under the tongue every 5 (five) minutes x 3 doses as needed. If no relief after 3rd dose, proceed to the ED for an evalutation 75 tablet 1  . rosuvastatin (CRESTOR) 5 MG tablet Take 1 tablet (5 mg total) by mouth daily. 90 tablet 3   No current facility-administered medications for this visit.   Allergies:  Protonix   Social History: The patient  reports that he quit smoking about 16 years ago. His smoking use included Cigarettes. He started smoking about 68 years ago. He has a 25 pack-year smoking history. He has never used smokeless tobacco. He reports that he does not drink alcohol or use illicit drugs.   ROS:  Please see the history of present illness. Otherwise, complete review of systems is positive for decreased hearing, improved leg cramps just recently off Zocor.  All other systems are reviewed and negative.  Physical Exam: VS:  BP 152/71 mmHg  Pulse 56  Ht 5\' 7"  (1.702 m)  Wt 138 lb 6.4 oz (62.778 kg)  BMI 21.67 kg/m2  SpO2 100%, BMI Body mass index is 21.67 kg/(m^2).  Wt Readings from Last 3  Encounters:  07/18/15 138 lb 6.4 oz (62.778 kg)  05/20/15 141 lb (63.957 kg)  04/14/15 143 lb (64.864 kg)    General: Patient appears comfortable at rest. HEENT: Conjunctiva and lids normal, oropharynx clear with moist mucosa. Neck: Supple, no elevated JVP or carotid bruits, no thyromegaly. Lungs: Clear to auscultation, nonlabored breathing at rest. Cardiac: Regular rate and rhythm, no S3 or significant systolic murmur, no pericardial rub. Abdomen: Soft, nontender, no hepatomegaly, bowel sounds present, no guarding or rebound. Extremities: No pitting edema, distal pulses 2+. Skin: Warm and dry. Musculoskeletal: No kyphosis. Neuropsychiatric: Alert and oriented x3, affect grossly appropriate.  ECG: I personally reviewed the recent tracing from 05/19/2015 which showed sinus rhythm with low voltage in limb leads and nonspecific ST changes.  Recent Labwork: 05/20/2015: BUN 18; Creatinine, Ser 1.19; Hemoglobin 9.5*; Platelets 356; Potassium 3.9; Sodium 137     Component Value  Date/Time   CHOL 134 04/23/2014 0057   TRIG 99 04/23/2014 0057   HDL 33* 04/23/2014 0057   CHOLHDL 4.1 04/23/2014 0057   VLDL 20 04/23/2014 0057   LDLCALC 81 04/23/2014 0057    Other Studies Reviewed Today:  Cardiac catheterization 05/19/2015: Coronary Findings    Dominance: Right   Left Anterior Descending   . Ost LAD to Prox LAD lesion, 80% stenosed.     Left Circumflex   . Ost Cx to Prox Cx lesion, 95% stenosed.   . Prox Cx to Mid Cx lesion, 100% stenosed.     Right Coronary Artery   . Mid RCA to Dist RCA lesion, 100% stenosed.     Graft Angiography    Graft to 2nd Diag     LIMA Graft to Dist LAD     Sequential Graft to 2nd Mrg, 3rd Mrg   . Prox Graft lesion before 2nd Mrg, 60% stenosed. Tubular.   . Prox Graft to Mid Graft lesion before 2nd Mrg, 60% stenosed. Ulcerative.   . Mid Graft to Dist Graft lesion before 2nd Mrg, 0% stenosed. Previously placed Mid Graft to Dist Graft stent  (unknown type) before 2nd Mrg is patent.     Graft to RPDA   . Dist Graft-1 lesion, 99% stenosed.   Marland Kitchen PCI: An unspecified stent was placed.  . There is no residual stenosis post intervention.     . Dist Graft-2 lesion, 0% stenosed. Previously placed Dist Graft-2 stent (unknown type) is patent.   Marland Kitchen PCI: An unspecified stent was placed.  . There is no residual stenosis post intervention.         IMPRESSION: Successful PCI and stenting of distal PDA SVG just proximal to the previous placed stent with a drug-eluting stent. Patient has moderate disease in the circumflex SVG just proximal to the previously stented segment however this did not appear to be obstructive.  Carotid Dopplers 07/16/2015: Stable 1-39% bilateral ICA stenoses.  Assessment and Plan:  1. Multivessel CAD status post CABG. Patient status post presentation with ACS in December 2016 at which time he was found to have a new lesion within the SVG to PDA requiring placement of DES just proximal to the prior stent implantation site. He does have moderate residual disease otherwise including the SVG to circumflex, but this will be managed medically for now. He is doing well symptomatically and is enrolled in the TWILIGHT study. We did not make any changes today.  2. Leg cramping, so far improved off Zocor which was recently stopped. If this remains the case, we will try low-dose Crestor 5 mg daily. He can then follow-up with Dr. Pleas Koch as scheduled in the spring for repeat lab work. His LDL has been controlled over time.  3. Essential hypertension, blood pressure is mildly elevated today. He reports compliance with his medications. Continue to follow-up with Dr. Pleas Koch.  4. Carotid artery disease, mild by recent carotid Dopplers. We discussed this today.  Current medicines were reviewed with the patient today.  Disposition: FU with me in 6 months.   Signed, Satira Sark, MD, [facility] 07/18/2015 2:27 PM    [facility] [address], [place], [place] [address] Phone: [phone]; Fax: [phone]

## 2015-07-18 NOTE — Patient Instructions (Signed)
Your physician wants you to follow-up in: 6 months with Dr. Ferne Reus will receive a reminder letter in the mail two months in advance. If you don't receive a letter, please call our office to schedule the follow-up appointment.  Your physician has recommended you make the following change in your medication:   IN 2 WEEKS START CRESTOR 5 MG DAILY.  Thank you for choosing [facility]!!

## 2015-08-13 ENCOUNTER — Encounter: Payer: Self-pay | Admitting: *Deleted

## 2015-08-13 ENCOUNTER — Other Ambulatory Visit: Payer: Self-pay | Admitting: *Deleted

## 2015-08-13 DIAGNOSIS — Z006 Encounter for examination for normal comparison and control in clinical research program: Secondary | ICD-10-CM

## 2015-08-13 MED ORDER — AMBULATORY NON FORMULARY MEDICATION
81.0000 mg | Freq: Every day | Status: DC
Start: 2015-08-13 — End: 2015-12-26

## 2015-08-13 NOTE — Progress Notes (Signed)
TWILIGHT Research Study # month Randomization visit completed. Patient states has not experienced any adverse or bleeding events. Patient also states has only missed 1 dose of ASA and brilinta. Randomized today to either ASA 81mg  or PLACEBO. Instructed patient to not take any open label ASA, Tylenol okay. Patient return 44 pills in bottle # [id number] & 16 pills from bottle # [id number]. Wife instructed to throw away any ASA in pill tray @ home. Dispensed Bottle # [id number]. Questions encouraged and answered. Follow up window schedule given to patient.

## 2015-08-19 ENCOUNTER — Other Ambulatory Visit: Payer: Self-pay | Admitting: *Deleted

## 2015-08-19 MED ORDER — ISOSORBIDE MONONITRATE ER 60 MG PO TB24: 30.0000 mg | ORAL_TABLET | Freq: Every day | ORAL | Status: DC

## 2015-08-21 ENCOUNTER — Telehealth: Payer: Self-pay | Admitting: *Deleted

## 2015-08-21 NOTE — Telephone Encounter (Signed)
Per wife, patient started crestor 5 mg on August 07, 2015 and is now itching all over body and also says that his legs are weak. Nurse advised patient's wife to stop the crestor to see if his symptoms of itching and leg weakness improve. Nurse also advised patient's wife to use benadryl for the itching. Wife verbalized understanding of plan.

## 2015-09-09 ENCOUNTER — Telehealth: Payer: Self-pay | Admitting: *Deleted

## 2015-09-09 NOTE — Telephone Encounter (Signed)
Wife left a message on nurse's voicemail asking what metoprolol was for and also that after patient stopped taking the cholesterol medication, the itching problem went away.

## 2015-09-09 NOTE — Telephone Encounter (Signed)
Message left on patient's voicemail to call office back to give the name of the cholesterol medication that caused itching.

## 2015-09-16 ENCOUNTER — Encounter: Payer: Self-pay | Admitting: *Deleted

## 2015-09-16 DIAGNOSIS — Z006 Encounter for examination for normal comparison and control in clinical research program: Secondary | ICD-10-CM

## 2015-09-16 NOTE — Progress Notes (Signed)
TWILIGHT Research 4 month telephone follow up completed. Patient states no adverse events or bleeding events. States compliant with medication. Questions encouraged in answered.

## 2015-09-16 NOTE — Telephone Encounter (Signed)
Left message for patient to call research office for month 4 TWILIGHT Research Study visit.

## 2015-09-17 ENCOUNTER — Telehealth: Payer: Self-pay | Admitting: Cardiology

## 2015-09-17 NOTE — Telephone Encounter (Signed)
Billy [name] continues to have shortness of breath. Patient states that he saw Dr. Pleas Koch on 09-16-15. Dr. Pleas Koch is requesting that patient see Dr. Domenic Polite soon.

## 2015-09-22 ENCOUNTER — Ambulatory Visit: Payer: Medicare Other | Admitting: Adult Health

## 2015-09-25 ENCOUNTER — Ambulatory Visit (INDEPENDENT_AMBULATORY_CARE_PROVIDER_SITE_OTHER): Payer: Medicare Other | Admitting: Cardiology

## 2015-09-25 ENCOUNTER — Encounter: Payer: Self-pay | Admitting: Cardiology

## 2015-09-25 ENCOUNTER — Ambulatory Visit: Payer: Medicare Other | Admitting: Physician Assistant

## 2015-09-25 VITALS — BP 128/60 | HR 58 | Ht 67.0 in | Wt 141.0 lb

## 2015-09-25 DIAGNOSIS — R0602 Shortness of breath: Secondary | ICD-10-CM | POA: Diagnosis not present

## 2015-09-25 DIAGNOSIS — I25810 Atherosclerosis of coronary artery bypass graft(s) without angina pectoris: Secondary | ICD-10-CM

## 2015-09-25 NOTE — Patient Instructions (Signed)
Your physician recommends that you schedule a follow-up appointment in: 1 month Dr Domenic Polite in [place] has requested that you have an echocardiogram. Echocardiography is a painless test that uses sound waves to create images of your heart. It provides your doctor with information about the size and shape of your heart and how well your heart's chambers and valves are working. This procedure takes approximately one hour. There are no restrictions for this procedure.   Your physician recommends that you continue on your current medications as directed. Please refer to the Current Medication list given to you today.     Thank you for choosing [facility] !

## 2015-09-25 NOTE — Progress Notes (Addendum)
Patient ID: Billy [name], male   DOB: 1934/12/17, 81 y.o.   MRN: [id number]     Clinical Summary Mr. Billy [name] is a 81 y.o.male regular patient of Dr Billy [name], he is seen today as an add on patient for recent SOB.   1. CAD - history of prior CABG - previous cath interventions as well, including Nov 2015 and 05/2015 as listed below. In 05/2015 received DES to SVG-PDA and moderate disease in the SVG-LCX was managed medically - 04/2014 echo 55-60%, akinesis of the basal midinferloateral wall  - SOB started after heart attack in 05/2015. Mainly SOB with exertion, occurs with walking short distances at just a few feet. Has had some LE edema, can have some occsional orthopnea. +cough with activity, occasional wheezing. Former tobacco x 50 years. Recent PFTs showed moderate COPD by pcp. Started on albuterol prn and flovent last week, breathing is improving.   2 Hyperilpidemia - leg cramping on zocor - reports crestor caused rash - has not been on statin recently    Past Medical History  Diagnosis Date  . Coronary atherosclerosis of native coronary artery     Multivessel status post CABG, DES x 2 SVG to OM with staged DES SVG to RCA November 2015, DES to SVG to PDA 05/2015  . Essential hypertension, benign   . Carotid artery disease (Billy [name])     123456 LICA and [id number] RICA - December 2013  . Sinus bradycardia     Asymptomatic  . Gout   . HOH (hard of hearing)      Allergies  Allergen Reactions  . Protonix [Pantoprazole Sodium] Itching and Rash     Current Outpatient Prescriptions  Medication Sig Dispense Refill  . acetaminophen (TYLENOL) 325 MG tablet Take 2 tablets (650 mg total) by mouth every 4 (four) hours as needed for headache or mild pain.    Marland Kitchen AMBULATORY NON FORMULARY MEDICATION Take 90 mg by mouth 2 (two) times daily. Medication Name: Brilinta 90 mg BID ([place] Research study provided do not fill)    . AMBULATORY NON FORMULARY MEDICATION Take 81 mg by mouth daily. Medication  Name: ASA 81 mg Daily or PLACEBO (TWILIGHT Research study provided)    . COLCRYS 0.6 MG tablet Take 1 tablet by mouth Once daily as needed (for gout).     . fluticasone (FLONASE) 50 MCG/ACT nasal spray Place 1-2 sprays into both nostrils daily as needed.    . isosorbide mononitrate (IMDUR) 60 MG 24 hr tablet Take 0.5 tablets (30 mg total) by mouth daily. 45 tablet 3  . metoprolol tartrate (LOPRESSOR) 25 MG tablet Take 1 tablet (25 mg total) by mouth 2 (two) times daily. 180 tablet 3  . nitroGLYCERIN (NITROSTAT) 0.4 MG SL tablet Place 1 tablet (0.4 mg total) under the tongue every 5 (five) minutes x 3 doses as needed. If no relief after 3rd dose, proceed to the ED for an evalutation 75 tablet 1  . rosuvastatin (CRESTOR) 5 MG tablet Take 1 tablet (5 mg total) by mouth daily. 90 tablet 3   No current facility-administered medications for this visit.     Past Surgical History  Procedure Laterality Date  . Coronary artery bypass graft  11/09/1999    LIMA to LAD, SVG to diagonal, SVG to OM1 and OM2, SVG to PDA  . Appendectomy      Ruptured  . Cataract extraction w/phaco Right 11/13/2012    Procedure: CATARACT EXTRACTION PHACO AND INTRAOCULAR LENS PLACEMENT (IOC);  Surgeon: Billy [name] , MD;  Location: [facility];  Service: Ophthalmology;  Laterality: Right;  CDE:12.75  . Cataract extraction w/phaco Left 01/11/2013    Procedure: CATARACT EXTRACTION PHACO AND INTRAOCULAR LENS PLACEMENT (IOC);  Surgeon: Billy [name] Wadie Mattie, MD;  Location: [facility];  Service: Ophthalmology;  Laterality: Left;  CDE: 21.13  . Left heart catheterization with coronary angiogram N/A 04/22/2014    Procedure: LEFT HEART CATHETERIZATION WITH CORONARY ANGIOGRAM;  Surgeon: Billy Blanks, MD;  Location: [facility] CATH LAB;  Service: Cardiovascular;  Laterality: N/A;  . Percutaneous coronary stent intervention (pci-s) N/A 04/25/2014    Procedure: PERCUTANEOUS CORONARY STENT INTERVENTION (PCI-S);  Surgeon: Billy M Martinique, MD;  Location: [facility] CATH LAB;   Service: Cardiovascular;  Laterality: N/A;  . Colonoscopy  09/2013    Dr. Burke [name]: colitis descending colon and sigmoid colon.Bx ?Cdiff vs ischemic.  . Cardiac catheterization N/A 05/19/2015    Procedure: Left Heart Cath and Cors/Grafts Angiography;  Surgeon: Billy Harp, MD;  Location: [place] CV LAB;  Service: Cardiovascular;  Laterality: N/A;  . Cardiac catheterization N/A 05/19/2015    Procedure: Coronary Stent Intervention;  Surgeon: Billy Harp, MD;  Location: [place] CV LAB;  Service: Cardiovascular;  Laterality: N/A;  . Esophagogastroduodenoscopy N/A 05/20/2015    Procedure: ESOPHAGOGASTRODUODENOSCOPY (EGD);  Surgeon: Billy Corner, MD;  Location: [facility] ENDOSCOPY;  Service: Endoscopy;  Laterality: N/A;     Allergies  Allergen Reactions  . Protonix [Pantoprazole Sodium] Itching and Rash      Family History  Problem Relation Age of Onset  . Cancer Father     colon, age 63s     Social History Billy [name] reports that he quit smoking about 16 years ago. His smoking use included Cigarettes. He started smoking about 68 years ago. He has a 25 pack-year smoking history. He has never used smokeless tobacco. Billy [name] reports that he does not drink alcohol.   Review of Systems CONSTITUTIONAL: No weight loss, fever, chills, weakness or fatigue.  HEENT: Eyes: No visual loss, blurred vision, double vision or yellow sclerae.No hearing loss, sneezing, congestion, runny nose or sore throat.  SKIN: No rash or itching.  CARDIOVASCULAR: per HPI RESPIRATORY: per HPI GASTROINTESTINAL: No anorexia, nausea, vomiting or diarrhea. No abdominal pain or blood.  GENITOURINARY: No burning on urination, no polyuria NEUROLOGICAL: No headache, dizziness, syncope, paralysis, ataxia, numbness or tingling in the extremities. No change in bowel or bladder control.  MUSCULOSKELETAL: No muscle, back pain, joint pain or stiffness.  LYMPHATICS: No enlarged nodes. No history of  splenectomy.  PSYCHIATRIC: No history of depression or anxiety.  ENDOCRINOLOGIC: No reports of sweating, cold or heat intolerance. No polyuria or polydipsia.  Marland Kitchen   Physical Examination Filed Vitals:   09/25/15 1406  BP: 128/60  Pulse: 58   Filed Vitals:   09/25/15 1406  Height: 5\' 7"  (1.702 m)  Weight: 141 lb (63.957 kg)    Gen: resting comfortably, no acute distress HEENT: no scleral icterus, pupils equal round and reactive, no palptable cervical adenopathy,  CV: RRR, no m/r/g, no jvd Resp: Clear to auscultation bilaterally GI: abdomen is soft, non-tender, non-distended, normal bowel sounds, no hepatosplenomegaly MSK: extremities are warm, no edema.  Skin: warm, no rash Neuro:  no focal deficits Psych: appropriate affect   Diagnostic Studies 05/2015 Cath  Mid RCA to Dist RCA lesion, 100% stenosed.  Ost Cx to Prox Cx lesion, 95% stenosed.  Prox Cx to Mid Cx lesion, 100% stenosed.  Ost LAD to Prox LAD lesion, 80% stenosed.  Prox  Graft lesion, before 2nd Mrg, 60% stenosed.  Prox Graft to Mid Graft lesion, before 2nd Mrg, 60% stenosed.  Dist Graft-1 lesion, 99% stenosed.  successful PCI and stenting of distal PDA SVG just proximal to the previous placed stent with a drug-eluting stent. Patient has moderate disease in the circumflex SVG just proximal to the previously stented segment however this did not appear to be obstructive. Angiomax was turned off. The sheath was secured. It will be removed in several hours and pressure held. The patient be hydrated overnight and most likely discharge him in the morning on dual antiplatelet therapy. He left the lab in stable condition.   04/2014 echo Study Conclusions  - Left ventricle: E/e&'>14.5 suggestive of elevated LV filling pressures. The cavity size was normal. There was mild focal basal hypertrophy of the septum. Systolic function was normal. The estimated ejection fraction was in the range of 55% to 60%.  There is akinesis of the basal-midinferolateral myocardium. - Aortic valve: Mildly calcified leaflets. There was trivial regurgitation. - Mitral valve: There was mild regurgitation. - Atrial septum: There was increased thickness of the septum, consistent with lipomatous hypertrophy. - Pulmonary arteries: PA peak pressure: 44 mm Hg (S).  Impressions:  - The right ventricular systolic pressure was increased consistent with moderate pulmonary hypertension.  Assessment and Plan  1. CAD/SOB - recent SOB since his most recent stent. He reports some occasional LE edema and orthopea. We will repeat echo as he has not had a recent study since his last intervention - SOB likely with some COPD component, notes some improvement since recently starting inhalers - no recent chest pain. EKG in clinic without ischemic changes. Will not pursue ischemic testing - family has some quesiton about brillinta causing his SOB, appears he was on it 11/20015 as well after that intervention and tolerated fine. Would not change at this time. He also has recent rash that is unlikely to be related to brillinta. If symptoms persist could consider changing to plavix at f/u.   F/u 1 month Dr Billy [name]     Arnoldo Lenis, M.D.

## 2015-09-29 ENCOUNTER — Ambulatory Visit: Payer: Medicare Other | Admitting: Adult Health

## 2015-09-30 ENCOUNTER — Other Ambulatory Visit: Payer: Self-pay | Admitting: *Deleted

## 2015-09-30 MED ORDER — METOPROLOL TARTRATE 25 MG PO TABS: 25.0000 mg | ORAL_TABLET | Freq: Two times a day (BID) | ORAL | Status: DC

## 2015-10-02 ENCOUNTER — Ambulatory Visit (INDEPENDENT_AMBULATORY_CARE_PROVIDER_SITE_OTHER): Payer: Medicare Other

## 2015-10-02 ENCOUNTER — Other Ambulatory Visit: Payer: Self-pay

## 2015-10-02 ENCOUNTER — Telehealth: Payer: Self-pay | Admitting: Cardiology

## 2015-10-02 DIAGNOSIS — R0602 Shortness of breath: Secondary | ICD-10-CM | POA: Diagnosis not present

## 2015-10-02 DIAGNOSIS — E782 Mixed hyperlipidemia: Secondary | ICD-10-CM

## 2015-10-02 NOTE — Telephone Encounter (Signed)
Reviewed. Last telephone communications indicated that the patient's complaints of itching had resolved since stopping Crestor. If this is not the case, it may not have had anything to do with Crestor in the first place. Hard to know, but at this point would stay off of Crestor and recheck lipid panel to see where we are. We can always try something different.

## 2015-10-02 NOTE — Telephone Encounter (Signed)
Billy [name] has a question about when is he suppose to start taking his cholesterol medication again ?

## 2015-10-02 NOTE — Telephone Encounter (Signed)
Pt wanting to know when/if he needs to start Crestor again - pt has not taken Crestor since 08/21/15 phone note. Pt says still has itching and leg weakness - also questioning if lipids need to be checked. Will forward to provider

## 2015-10-03 NOTE — Telephone Encounter (Signed)
Pt aware and will have lipids checked next week. Mailed lab orders as requested

## 2015-10-06 ENCOUNTER — Telehealth: Payer: Self-pay

## 2015-10-06 DIAGNOSIS — E782 Mixed hyperlipidemia: Secondary | ICD-10-CM | POA: Diagnosis not present

## 2015-10-06 NOTE — Telephone Encounter (Signed)
Echo results given to wife as pt is HOH,copy to pcp

## 2015-10-09 ENCOUNTER — Telehealth: Payer: Self-pay | Admitting: *Deleted

## 2015-10-09 ENCOUNTER — Encounter: Payer: Self-pay | Admitting: *Deleted

## 2015-10-09 NOTE — Telephone Encounter (Signed)
-----   Message from Satira Sark, MD sent at 10/03/2015 12:37 PM EDT ----- This test was ordered by Dr. Harl Bowie who saw Billy [name] as an add-on for shortness of breath. I reviewed the note. Please let Billy [name] know that I did review the study, there has been no substantial change in LVEF. We will see how he is doing in terms of symptoms at office follow-up.

## 2015-10-09 NOTE — Telephone Encounter (Signed)
Patient informed. 

## 2015-10-13 ENCOUNTER — Telehealth: Payer: Self-pay | Admitting: *Deleted

## 2015-10-13 NOTE — Telephone Encounter (Signed)
Patient informed. 

## 2015-10-13 NOTE — Telephone Encounter (Signed)
-----   Message from Satira Sark, MD sent at 10/13/2015 11:17 AM EDT ----- Reviewed results. He has been off Crestor as noted in the telephone communications. Lipids are back up somewhat, total cholesterol 197 and LDL 118. We can discuss alternatives for treatment when I see him back.

## 2015-10-15 DIAGNOSIS — E782 Mixed hyperlipidemia: Secondary | ICD-10-CM | POA: Diagnosis not present

## 2015-10-15 DIAGNOSIS — R05 Cough: Secondary | ICD-10-CM | POA: Diagnosis not present

## 2015-10-15 DIAGNOSIS — I10 Essential (primary) hypertension: Secondary | ICD-10-CM | POA: Diagnosis not present

## 2015-10-15 DIAGNOSIS — I214 Non-ST elevation (NSTEMI) myocardial infarction: Secondary | ICD-10-CM | POA: Diagnosis not present

## 2015-10-15 DIAGNOSIS — D649 Anemia, unspecified: Secondary | ICD-10-CM | POA: Diagnosis not present

## 2015-11-04 ENCOUNTER — Ambulatory Visit (INDEPENDENT_AMBULATORY_CARE_PROVIDER_SITE_OTHER): Payer: Medicare Other | Admitting: Cardiology

## 2015-11-04 ENCOUNTER — Encounter: Payer: Self-pay | Admitting: *Deleted

## 2015-11-04 ENCOUNTER — Encounter: Payer: Self-pay | Admitting: Cardiology

## 2015-11-04 VITALS — BP 150/65 | HR 59 | Ht 67.0 in | Wt 142.6 lb

## 2015-11-04 DIAGNOSIS — Z125 Encounter for screening for malignant neoplasm of prostate: Secondary | ICD-10-CM | POA: Diagnosis not present

## 2015-11-04 DIAGNOSIS — R5383 Other fatigue: Secondary | ICD-10-CM | POA: Diagnosis not present

## 2015-11-04 DIAGNOSIS — M545 Low back pain: Secondary | ICD-10-CM | POA: Diagnosis not present

## 2015-11-04 DIAGNOSIS — I10 Essential (primary) hypertension: Secondary | ICD-10-CM | POA: Diagnosis not present

## 2015-11-04 DIAGNOSIS — D649 Anemia, unspecified: Secondary | ICD-10-CM | POA: Diagnosis not present

## 2015-11-04 DIAGNOSIS — E782 Mixed hyperlipidemia: Secondary | ICD-10-CM

## 2015-11-04 DIAGNOSIS — I25119 Atherosclerotic heart disease of native coronary artery with unspecified angina pectoris: Secondary | ICD-10-CM

## 2015-11-04 DIAGNOSIS — N400 Enlarged prostate without lower urinary tract symptoms: Secondary | ICD-10-CM | POA: Diagnosis not present

## 2015-11-04 MED ORDER — ATORVASTATIN CALCIUM 10 MG PO TABS: 10.0000 mg | ORAL_TABLET | Freq: Every day | ORAL | Status: DC

## 2015-11-04 NOTE — Progress Notes (Signed)
Cardiology Office Note  Date: 11/04/2015   ID: ESTA RANFT, DOB 10/02/1934, MRN [id number]  PCP: Curlene Labrum, MD  Primary Cardiologist: Rozann Lesches, MD   Chief Complaint  Patient presents with  . Coronary Artery Disease    History of Present Illness: Billy [name] is a 81 y.o. male seen recently in the office by Dr. Harl Bowie as an add on in April for shortness of breath. I reviewed the note, COPD was felt to be contributing, however echocardiogram was also obtained to follow-up on cardiac structure and function. LVEF was 55% with grade 2 diastolic dysfunction, overall no substantial change.  He is here today with his wife for a follow-up visit. Complains of a variety of symptoms including intermittent pruritus which does not seem to be related to his statin therapy since he has been off of Crestor for at least a month. Also intermittent arm and muscle aching. Atypical chest pain. He is enrolled in the [place] study. Reports compliance with his medications.  Follow-up lipid panel showed increase in LDL to 118 on statin therapy. We discussed trying low-dose atorvastatin 10 mg daily.  Past Medical History  Diagnosis Date  . Coronary atherosclerosis of native coronary artery     Multivessel status post CABG, DES x 2 SVG to OM with staged DES SVG to RCA November 2015, DES to SVG to PDA 05/2015  . Essential hypertension, benign   . Carotid artery disease ([place])     123456 LICA and [id number] RICA - December 2013  . Sinus bradycardia     Asymptomatic  . Gout   . HOH (hard of hearing)     Past Surgical History  Procedure Laterality Date  . Coronary artery bypass graft  11/09/1999    LIMA to LAD, SVG to diagonal, SVG to OM1 and OM2, SVG to PDA  . Appendectomy      Ruptured  . Cataract extraction w/phaco Right 11/13/2012    Procedure: CATARACT EXTRACTION PHACO AND INTRAOCULAR LENS PLACEMENT (IOC);  Surgeon: Tonny Branch, MD;  Location: [facility];  Service: Ophthalmology;  Laterality:  Right;  CDE:12.75  . Cataract extraction w/phaco Left 01/11/2013    Procedure: CATARACT EXTRACTION PHACO AND INTRAOCULAR LENS PLACEMENT (IOC);  Surgeon: Tonny Branch, MD;  Location: [facility];  Service: Ophthalmology;  Laterality: Left;  CDE: 21.13  . Left heart catheterization with coronary angiogram N/A 04/22/2014    Procedure: LEFT HEART CATHETERIZATION WITH CORONARY ANGIOGRAM;  Surgeon: Burnell Blanks, MD;  Location: [facility] CATH LAB;  Service: Cardiovascular;  Laterality: N/A;  . Percutaneous coronary stent intervention (pci-s) N/A 04/25/2014    Procedure: PERCUTANEOUS CORONARY STENT INTERVENTION (PCI-S);  Surgeon: Peter M Martinique, MD;  Location: [facility] CATH LAB;  Service: Cardiovascular;  Laterality: N/A;  . Colonoscopy  09/2013    Dr. Burke Keels: colitis descending colon and sigmoid colon.Bx ?Cdiff vs ischemic.  . Cardiac catheterization N/A 05/19/2015    Procedure: Left Heart Cath and Cors/Grafts Angiography;  Surgeon: Lorretta Harp, MD;  Location: [place] CV LAB;  Service: Cardiovascular;  Laterality: N/A;  . Cardiac catheterization N/A 05/19/2015    Procedure: Coronary Stent Intervention;  Surgeon: Lorretta Harp, MD;  Location: [place] CV LAB;  Service: Cardiovascular;  Laterality: N/A;  . Esophagogastroduodenoscopy N/A 05/20/2015    Procedure: ESOPHAGOGASTRODUODENOSCOPY (EGD);  Surgeon: Wilford Corner, MD;  Location: [facility] ENDOSCOPY;  Service: Endoscopy;  Laterality: N/A;    Current Outpatient Prescriptions  Medication Sig Dispense Refill  . acetaminophen (TYLENOL) 325 MG  tablet Take 2 tablets (650 mg total) by mouth every 4 (four) hours as needed for headache or mild pain.    Marland Kitchen albuterol (PROVENTIL HFA;VENTOLIN HFA) 108 (90 Base) MCG/ACT inhaler Inhale 1-2 puffs into the lungs every 6 (six) hours as needed for wheezing or shortness of breath.    . AMBULATORY NON FORMULARY MEDICATION Take 90 mg by mouth 2 (two) times daily. Medication Name: Brilinta 90 mg BID (TWILIGHT Research  study provided do not fill)    . AMBULATORY NON FORMULARY MEDICATION Take 81 mg by mouth daily. Medication Name: ASA 81 mg Daily or PLACEBO (TWILIGHT Research study provided)    . COLCRYS 0.6 MG tablet Take 1 tablet by mouth Once daily as needed (for gout).     Marland Kitchen esomeprazole (NEXIUM) 40 MG capsule Take 40 mg by mouth daily as needed.    . fluticasone (FLONASE) 50 MCG/ACT nasal spray Place 1-2 sprays into both nostrils daily as needed.    . fluticasone (FLOVENT HFA) 44 MCG/ACT inhaler Inhale 2 puffs into the lungs 2 (two) times daily.    . isosorbide mononitrate (IMDUR) 60 MG 24 hr tablet Take 0.5 tablets (30 mg total) by mouth daily. 45 tablet 3  . loratadine (CLARITIN) 10 MG tablet Take 10 mg by mouth at bedtime.    . metoprolol tartrate (LOPRESSOR) 25 MG tablet Take 1 tablet (25 mg total) by mouth 2 (two) times daily. 180 tablet 3  . nitroGLYCERIN (NITROSTAT) 0.4 MG SL tablet Place 1 tablet (0.4 mg total) under the tongue every 5 (five) minutes x 3 doses as needed. If no relief after 3rd dose, proceed to the ED for an evalutation 75 tablet 1  . atorvastatin (LIPITOR) 10 MG tablet Take 1 tablet (10 mg total) by mouth daily. 30 tablet 0   No current facility-administered medications for this visit.   Allergies:  Protonix   Social History: The patient  reports that he quit smoking about 16 years ago. His smoking use included Cigarettes. He started smoking about 68 years ago. He has a 25 pack-year smoking history. He has never used smokeless tobacco. He reports that he does not drink alcohol or use illicit drugs.   ROS:  Please see the history of present illness. Otherwise, complete review of systems is positive for chronic shortness of breath.  All other systems are reviewed and negative.   Physical Exam: VS:  BP 150/65 mmHg  Pulse 59  Ht 5\' 7"  (1.702 m)  Wt 142 lb 9.6 oz (64.683 kg)  BMI 22.33 kg/m2  SpO2 99%, BMI Body mass index is 22.33 kg/(m^2).  Wt Readings from Last 3 Encounters:    11/04/15 142 lb 9.6 oz (64.683 kg)  09/25/15 141 lb (63.957 kg)  07/18/15 138 lb 6.4 oz (62.778 kg)    General: Patient appears comfortable at rest. HEENT: Conjunctiva and lids normal, oropharynx clear with moist mucosa. Neck: Supple, no elevated JVP or carotid bruits, no thyromegaly. Lungs: Clear to auscultation, nonlabored breathing at rest. Cardiac: Regular rate and rhythm, no S3 or significant systolic murmur, no pericardial rub. Abdomen: Soft, nontender, no hepatomegaly, bowel sounds present, no guarding or rebound. Extremities: No pitting edema, distal pulses 2+.  ECG: I personally reviewed the tracing from 09/25/2015 which showed sinus bradycardia with anteroseptal Q waves and nonspecific ST segment changes.  Recent Labwork: 05/20/2015: BUN 18; Creatinine, Ser 1.19; Hemoglobin 9.5*; Platelets 356; Potassium 3.9; Sodium 137     Component Value Date/Time   CHOL 134 04/23/2014 0057  TRIG 99 04/23/2014 0057   HDL 33* 04/23/2014 0057   CHOLHDL 4.1 04/23/2014 0057   VLDL 20 04/23/2014 0057   LDLCALC 81 04/23/2014 0057   Other Studies Reviewed Today:  Echocardiogram 10/02/2015: Study Conclusions  - Left ventricle: The cavity size was at the upper limits of  normal. Wall thickness was normal. The estimated ejection  fraction was 55%. There is akinesis of the basal-midinferolateral  and inferior myocardium. Features are consistent with a  pseudonormal left ventricular filling pattern, with concomitant  abnormal relaxation and increased filling pressure (grade 2  diastolic dysfunction). - Aortic valve: Mildly calcified annulus. Trileaflet; mildly  calcified leaflets. There was trivial regurgitation. - Mitral valve: Mildly thickened leaflets . There was mild to  moderate regurgitation. - Left atrium: The atrium was moderately dilated. - Right atrium: Central venous pressure (est): 3 mm Hg. - Atrial septum: No defect or patent foramen ovale was identified. - Tricuspid  valve: There was trivial regurgitation. - Pulmonary arteries: PA peak pressure: 36 mm Hg (S). - Pericardium, extracardiac: There was no pericardial effusion.  Impressions:  - Upper normal LV chamber size with normal wall thickness and LVEF  approximately 55%. Akinesis of the mid to basal inferoposterior  myocardium consistent with previous infarct. Grade 2 diastolic  dysfunction with increased LV filling pressure. Moderate left  atrial enlargement. Mildly thickened mitral leaflets with mild to  moderate mitral regurgitation. Mildly sclerotic aortic valve with  trivial aortic regurgitation. Trivial tricuspid regurgitation  with PASP estimated 36 mmHg.  Assessment and Plan:  1. Multivessel CAD status post CABG with subsequent vein graft interventions, most recently DES to the SVG to PDA in December 2016. He is enrolled in the [place] study. Continue current medical regimen and observation. Follow-up LVEF 55% with grade 2 diastolic dysfunction.  2. Hyperlipidemia, LDL up to 118 having stopped Crestor. He has had concerns about statin side effects so far on simvastatin and then Crestor. We will try Lipitor at 10 mg daily to see how he does.  3. Essential hypertension, otherwise no changes made today.  Current medicines were reviewed with the patient today.  Disposition: FU with me in August as scheduled.  Signed, Satira Sark, MD, [facility] 11/04/2015 4:27 PM [facility] [address], [place], [place] [address] Phone: [phone]; Fax: [phone]

## 2015-11-04 NOTE — Patient Instructions (Signed)
Your physician has recommended you make the following change in your medication:  Start lipitor 10 mg daily at bedtime. Continue all other medications the same. Your physician recommends that you keep the already scheduled appointment for January 19, 2016.

## 2015-11-07 ENCOUNTER — Encounter: Payer: Self-pay | Admitting: Cardiology

## 2015-11-24 ENCOUNTER — Other Ambulatory Visit: Payer: Self-pay | Admitting: *Deleted

## 2015-11-24 MED ORDER — ATORVASTATIN CALCIUM 10 MG PO TABS: 10.0000 mg | ORAL_TABLET | Freq: Every day | ORAL | Status: DC

## 2015-11-27 ENCOUNTER — Other Ambulatory Visit: Payer: Self-pay | Admitting: *Deleted

## 2015-11-27 MED ORDER — ATORVASTATIN CALCIUM 10 MG PO TABS: 10.0000 mg | ORAL_TABLET | Freq: Every day | ORAL | Status: DC

## 2015-12-22 DIAGNOSIS — C44219 Basal cell carcinoma of skin of left ear and external auricular canal: Secondary | ICD-10-CM | POA: Diagnosis not present

## 2015-12-22 DIAGNOSIS — C44319 Basal cell carcinoma of skin of other parts of face: Secondary | ICD-10-CM | POA: Diagnosis not present

## 2015-12-22 DIAGNOSIS — D225 Melanocytic nevi of trunk: Secondary | ICD-10-CM | POA: Diagnosis not present

## 2015-12-23 ENCOUNTER — Observation Stay (HOSPITAL_COMMUNITY)
Admission: EM | Admit: 2015-12-23 | Discharge: 2015-12-26 | Disposition: A | Discharge status: Home / Self Care | Payer: Medicare Other | Attending: Internal Medicine | Admitting: Internal Medicine

## 2015-12-23 ENCOUNTER — Observation Stay (HOSPITAL_COMMUNITY): Payer: Medicare Other

## 2015-12-23 ENCOUNTER — Emergency Department (HOSPITAL_COMMUNITY): Payer: Medicare Other

## 2015-12-23 ENCOUNTER — Telehealth: Payer: Self-pay | Admitting: *Deleted

## 2015-12-23 ENCOUNTER — Encounter (HOSPITAL_COMMUNITY): Payer: Self-pay | Admitting: Emergency Medicine

## 2015-12-23 DIAGNOSIS — I10 Essential (primary) hypertension: Secondary | ICD-10-CM | POA: Diagnosis not present

## 2015-12-23 DIAGNOSIS — D62 Acute posthemorrhagic anemia: Secondary | ICD-10-CM | POA: Insufficient documentation

## 2015-12-23 DIAGNOSIS — Z951 Presence of aortocoronary bypass graft: Secondary | ICD-10-CM | POA: Insufficient documentation

## 2015-12-23 DIAGNOSIS — Z9842 Cataract extraction status, left eye: Secondary | ICD-10-CM | POA: Insufficient documentation

## 2015-12-23 DIAGNOSIS — J449 Chronic obstructive pulmonary disease, unspecified: Secondary | ICD-10-CM | POA: Diagnosis not present

## 2015-12-23 DIAGNOSIS — R42 Dizziness and giddiness: Secondary | ICD-10-CM | POA: Diagnosis not present

## 2015-12-23 DIAGNOSIS — K269 Duodenal ulcer, unspecified as acute or chronic, without hemorrhage or perforation: Secondary | ICD-10-CM | POA: Insufficient documentation

## 2015-12-23 DIAGNOSIS — D500 Iron deficiency anemia secondary to blood loss (chronic): Secondary | ICD-10-CM | POA: Insufficient documentation

## 2015-12-23 DIAGNOSIS — I700 Atherosclerosis of aorta: Secondary | ICD-10-CM | POA: Diagnosis not present

## 2015-12-23 DIAGNOSIS — Z7982 Long term (current) use of aspirin: Secondary | ICD-10-CM | POA: Insufficient documentation

## 2015-12-23 DIAGNOSIS — K449 Diaphragmatic hernia without obstruction or gangrene: Secondary | ICD-10-CM | POA: Diagnosis not present

## 2015-12-23 DIAGNOSIS — K529 Noninfective gastroenteritis and colitis, unspecified: Secondary | ICD-10-CM | POA: Diagnosis not present

## 2015-12-23 DIAGNOSIS — M109 Gout, unspecified: Secondary | ICD-10-CM | POA: Diagnosis not present

## 2015-12-23 DIAGNOSIS — R0602 Shortness of breath: Secondary | ICD-10-CM | POA: Diagnosis not present

## 2015-12-23 DIAGNOSIS — H9190 Unspecified hearing loss, unspecified ear: Secondary | ICD-10-CM | POA: Diagnosis not present

## 2015-12-23 DIAGNOSIS — I2510 Atherosclerotic heart disease of native coronary artery without angina pectoris: Secondary | ICD-10-CM | POA: Insufficient documentation

## 2015-12-23 DIAGNOSIS — Z9841 Cataract extraction status, right eye: Secondary | ICD-10-CM | POA: Insufficient documentation

## 2015-12-23 DIAGNOSIS — Z800 Family history of malignant neoplasm of digestive organs: Secondary | ICD-10-CM | POA: Diagnosis not present

## 2015-12-23 DIAGNOSIS — D649 Anemia, unspecified: Secondary | ICD-10-CM

## 2015-12-23 DIAGNOSIS — K429 Umbilical hernia without obstruction or gangrene: Secondary | ICD-10-CM | POA: Diagnosis not present

## 2015-12-23 DIAGNOSIS — Z79899 Other long term (current) drug therapy: Secondary | ICD-10-CM

## 2015-12-23 DIAGNOSIS — I493 Ventricular premature depolarization: Secondary | ICD-10-CM | POA: Diagnosis not present

## 2015-12-23 DIAGNOSIS — R001 Bradycardia, unspecified: Secondary | ICD-10-CM | POA: Diagnosis not present

## 2015-12-23 DIAGNOSIS — Z888 Allergy status to other drugs, medicaments and biological substances status: Secondary | ICD-10-CM | POA: Diagnosis not present

## 2015-12-23 DIAGNOSIS — K921 Melena: Secondary | ICD-10-CM | POA: Diagnosis not present

## 2015-12-23 DIAGNOSIS — E785 Hyperlipidemia, unspecified: Secondary | ICD-10-CM | POA: Diagnosis not present

## 2015-12-23 DIAGNOSIS — Z87891 Personal history of nicotine dependence: Secondary | ICD-10-CM | POA: Diagnosis not present

## 2015-12-23 DIAGNOSIS — W19XXXA Unspecified fall, initial encounter: Secondary | ICD-10-CM

## 2015-12-23 DIAGNOSIS — R55 Syncope and collapse: Secondary | ICD-10-CM | POA: Diagnosis not present

## 2015-12-23 DIAGNOSIS — R404 Transient alteration of awareness: Secondary | ICD-10-CM | POA: Diagnosis not present

## 2015-12-23 DIAGNOSIS — I2584 Coronary atherosclerosis due to calcified coronary lesion: Secondary | ICD-10-CM | POA: Insufficient documentation

## 2015-12-23 DIAGNOSIS — K319 Disease of stomach and duodenum, unspecified: Secondary | ICD-10-CM | POA: Insufficient documentation

## 2015-12-23 DIAGNOSIS — D509 Iron deficiency anemia, unspecified: Secondary | ICD-10-CM | POA: Insufficient documentation

## 2015-12-23 DIAGNOSIS — I252 Old myocardial infarction: Secondary | ICD-10-CM | POA: Insufficient documentation

## 2015-12-23 DIAGNOSIS — I25810 Atherosclerosis of coronary artery bypass graft(s) without angina pectoris: Secondary | ICD-10-CM | POA: Insufficient documentation

## 2015-12-23 LAB — CBC
HCT: 27 % — ABNORMAL LOW (ref 39.0–52.0)
HEMATOCRIT: 24.3 % — AB (ref 39.0–52.0)
HEMOGLOBIN: 8.3 g/dL — AB (ref 13.0–17.0)
Hemoglobin: 7.2 g/dL — ABNORMAL LOW (ref 13.0–17.0)
MCH: 23.2 pg — ABNORMAL LOW (ref 26.0–34.0)
MCH: 24.3 pg — ABNORMAL LOW (ref 26.0–34.0)
MCHC: 29.6 g/dL — ABNORMAL LOW (ref 30.0–36.0)
MCHC: 30.7 g/dL (ref 30.0–36.0)
MCV: 78.1 fL (ref 78.0–100.0)
MCV: 78.9 fL (ref 78.0–100.0)
PLATELETS: 279 10*3/uL (ref 150–400)
PLATELETS: 261 10*3/uL (ref 150–400)
RBC: 3.11 MIL/uL — AB (ref 4.22–5.81)
RBC: 3.42 MIL/uL — AB (ref 4.22–5.81)
RDW: 15.4 % (ref 11.5–15.5)
RDW: 15.4 % (ref 11.5–15.5)
WBC: 5.3 10*3/uL (ref 4.0–10.5)
WBC: 4.7 10*3/uL (ref 4.0–10.5)

## 2015-12-23 LAB — URINALYSIS, ROUTINE W REFLEX MICROSCOPIC
Bilirubin Urine: NEGATIVE
GLUCOSE, UA: NEGATIVE mg/dL
Hgb urine dipstick: NEGATIVE
KETONES UR: NEGATIVE mg/dL
LEUKOCYTES UA: NEGATIVE
NITRITE: NEGATIVE
PROTEIN: NEGATIVE mg/dL
Specific Gravity, Urine: 1.02 (ref 1.005–1.030)
pH: 6 (ref 5.0–8.0)

## 2015-12-23 LAB — HEPATIC FUNCTION PANEL
ALT: 23 U/L (ref 17–63)
AST: 25 U/L (ref 15–41)
Albumin: 3.8 g/dL (ref 3.5–5.0)
Alkaline Phosphatase: 50 U/L (ref 38–126)
TOTAL PROTEIN: 6.8 g/dL (ref 6.5–8.1)
Total Bilirubin: 0.6 mg/dL (ref 0.3–1.2)

## 2015-12-23 LAB — FERRITIN: Ferritin: 12 ng/mL — ABNORMAL LOW (ref 24–336)

## 2015-12-23 LAB — BASIC METABOLIC PANEL
BUN: 26 mg/dL — AB (ref 6–20)
CHLORIDE: 108 mmol/L (ref 101–111)
CO2: 25 mmol/L (ref 22–32)
CREATININE: 1.35 mg/dL — AB (ref 0.61–1.24)
Calcium: 8.5 mg/dL — ABNORMAL LOW (ref 8.9–10.3)
GFR calc Af Amer: 56 mL/min — ABNORMAL LOW (ref >60)
GFR, EST NON AFRICAN AMERICAN: 49 mL/min — AB (ref >60)
Glucose, Bld: 155 mg/dL — ABNORMAL HIGH (ref 65–99)
POTASSIUM: 4.1 mmol/L (ref 3.5–5.1)
SODIUM: 135 mmol/L (ref 135–145)

## 2015-12-23 LAB — IRON AND TIBC
IRON: 20 ug/dL — AB (ref 45–182)
Saturation Ratios: 4 % — ABNORMAL LOW (ref 17.9–39.5)
TIBC: 535 ug/dL — AB (ref 250–450)
UIBC: 515 ug/dL

## 2015-12-23 LAB — RETICULOCYTES
RBC.: 3.09 MIL/uL — ABNORMAL LOW (ref 4.22–5.81)
RETIC CT PCT: 2.3 % (ref 0.4–3.1)
Retic Count, Absolute: 71.1 10*3/uL (ref 19.0–186.0)

## 2015-12-23 LAB — FOLATE: FOLATE: 21.7 ng/mL (ref >5.9)

## 2015-12-23 LAB — CBG MONITORING, ED: Glucose-Capillary: 167 mg/dL — ABNORMAL HIGH (ref 65–99)

## 2015-12-23 LAB — I-STAT TROPONIN, ED: TROPONIN I, POC: 0.01 ng/mL (ref 0.00–0.08)

## 2015-12-23 LAB — VITAMIN B12: VITAMIN B 12: 299 pg/mL (ref 180–914)

## 2015-12-23 LAB — PREPARE RBC (CROSSMATCH)

## 2015-12-23 LAB — ABO/RH: ABO/RH(D): A POS

## 2015-12-23 LAB — MAGNESIUM: MAGNESIUM: 2.1 mg/dL (ref 1.7–2.4)

## 2015-12-23 MED ORDER — LORATADINE 10 MG PO TABS
10.0000 mg | ORAL_TABLET | Freq: Every day | ORAL | Status: DC
  Administered 2015-12-23 – 2015-12-25 (×2): 10 mg via ORAL
  Filled 2015-12-23 (×3): qty 1

## 2015-12-23 MED ORDER — TAMSULOSIN HCL 0.4 MG PO CAPS
0.4000 mg | ORAL_CAPSULE | Freq: Every day | ORAL | Status: DC
  Administered 2015-12-23 – 2015-12-25 (×3): 0.4 mg via ORAL
  Filled 2015-12-23 (×3): qty 1

## 2015-12-23 MED ORDER — ISOSORBIDE MONONITRATE ER 60 MG PO TB24
30.0000 mg | ORAL_TABLET | Freq: Every day | ORAL | Status: DC
  Administered 2015-12-24 – 2015-12-26 (×3): 30 mg via ORAL
  Filled 2015-12-23 (×3): qty 1

## 2015-12-23 MED ORDER — METOPROLOL TARTRATE 25 MG PO TABS
12.5000 mg | ORAL_TABLET | Freq: Two times a day (BID) | ORAL | Status: DC
  Administered 2015-12-23 – 2015-12-26 (×6): 12.5 mg via ORAL
  Filled 2015-12-23 (×6): qty 1

## 2015-12-23 MED ORDER — SODIUM CHLORIDE 0.9% FLUSH
3.0000 mL | Freq: Two times a day (BID) | INTRAVENOUS | Status: DC
  Administered 2015-12-23 – 2015-12-25 (×6): 3 mL via INTRAVENOUS

## 2015-12-23 MED ORDER — PANTOPRAZOLE SODIUM 40 MG PO TBEC: 40.0000 mg | DELAYED_RELEASE_TABLET | Freq: Every day | ORAL | Status: DC

## 2015-12-23 MED ORDER — COLCHICINE 0.6 MG PO TABS
0.6000 mg | ORAL_TABLET | Freq: Every day | ORAL | Status: DC
  Administered 2015-12-25: 0.6 mg via ORAL
  Filled 2015-12-23 (×3): qty 1

## 2015-12-23 MED ORDER — CYCLOBENZAPRINE HCL 10 MG PO TABS
10.0000 mg | ORAL_TABLET | Freq: Every day | ORAL | Status: DC
  Administered 2015-12-23 – 2015-12-25 (×3): 10 mg via ORAL
  Filled 2015-12-23 (×3): qty 1

## 2015-12-23 MED ORDER — ATORVASTATIN CALCIUM 10 MG PO TABS
10.0000 mg | ORAL_TABLET | Freq: Every day | ORAL | Status: DC
  Administered 2015-12-23 – 2015-12-25 (×2): 10 mg via ORAL
  Filled 2015-12-23 (×3): qty 1

## 2015-12-23 MED ORDER — ACETAMINOPHEN 325 MG PO TABS
650.0000 mg | ORAL_TABLET | Freq: Four times a day (QID) | ORAL | Status: DC | PRN
  Administered 2015-12-25 (×2): 650 mg via ORAL
  Filled 2015-12-23 (×2): qty 2

## 2015-12-23 MED ORDER — ONDANSETRON HCL 4 MG/2ML IJ SOLN: 4.0000 mg | Freq: Four times a day (QID) | INTRAMUSCULAR | Status: DC | PRN

## 2015-12-23 MED ORDER — SODIUM CHLORIDE 0.9 % IV SOLN: Freq: Once | INTRAVENOUS | Status: DC

## 2015-12-23 MED ORDER — ENOXAPARIN SODIUM 40 MG/0.4ML ~~LOC~~ SOLN
40.0000 mg | SUBCUTANEOUS | Status: DC
  Administered 2015-12-23: 40 mg via SUBCUTANEOUS
  Filled 2015-12-23: qty 0.4

## 2015-12-23 MED ORDER — TICAGRELOR 90 MG PO TABS
90.0000 mg | ORAL_TABLET | Freq: Two times a day (BID) | ORAL | Status: DC
  Administered 2015-12-23 – 2015-12-24 (×2): 90 mg via ORAL
  Filled 2015-12-23 (×6): qty 1

## 2015-12-23 MED ORDER — ALBUTEROL SULFATE (2.5 MG/3ML) 0.083% IN NEBU: 2.5000 mg | INHALATION_SOLUTION | Freq: Four times a day (QID) | RESPIRATORY_TRACT | Status: DC | PRN

## 2015-12-23 MED ORDER — SODIUM CHLORIDE 0.9% FLUSH: 3.0000 mL | INTRAVENOUS | Status: DC | PRN

## 2015-12-23 MED ORDER — ALBUTEROL SULFATE HFA 108 (90 BASE) MCG/ACT IN AERS: 1.0000 | INHALATION_SPRAY | Freq: Four times a day (QID) | RESPIRATORY_TRACT | Status: DC | PRN

## 2015-12-23 MED ORDER — SENNOSIDES-DOCUSATE SODIUM 8.6-50 MG PO TABS
1.0000 | ORAL_TABLET | Freq: Every evening | ORAL | Status: DC | PRN
  Administered 2015-12-25: 1 via ORAL
  Filled 2015-12-23: qty 1

## 2015-12-23 MED ORDER — ONDANSETRON HCL 4 MG PO TABS: 4.0000 mg | ORAL_TABLET | Freq: Four times a day (QID) | ORAL | Status: DC | PRN

## 2015-12-23 MED ORDER — SODIUM CHLORIDE 0.9 % IV SOLN
Freq: Once | INTRAVENOUS | Status: AC
  Administered 2015-12-23: 17:00:00 via INTRAVENOUS

## 2015-12-23 MED ORDER — PANTOPRAZOLE SODIUM 40 MG PO TBEC
40.0000 mg | DELAYED_RELEASE_TABLET | Freq: Every day | ORAL | Status: DC
  Administered 2015-12-25 – 2015-12-26 (×2): 40 mg via ORAL
  Filled 2015-12-23 (×7): qty 1

## 2015-12-23 MED ORDER — ACETAMINOPHEN 650 MG RE SUPP: 650.0000 mg | Freq: Four times a day (QID) | RECTAL | Status: DC | PRN

## 2015-12-23 MED ORDER — SODIUM CHLORIDE 0.9 % IV SOLN
Freq: Once | INTRAVENOUS | Status: AC
  Administered 2015-12-23: 13:00:00 via INTRAVENOUS

## 2015-12-23 MED ORDER — SODIUM CHLORIDE 0.9 % IV SOLN: 250.0000 mL | INTRAVENOUS | Status: DC | PRN

## 2015-12-23 MED ORDER — SODIUM CHLORIDE 0.9% FLUSH
3.0000 mL | Freq: Two times a day (BID) | INTRAVENOUS | Status: DC
  Administered 2015-12-24 – 2015-12-25 (×3): 3 mL via INTRAVENOUS

## 2015-12-23 NOTE — Telephone Encounter (Signed)
EMS called out for syncope episode per wife. Per wife, patient was sitting on the bar stool and passed completely out. EMS examined patient and offered to take patient to ED but patient declined. Per wife, vitals from EMS exam were BP 139/68; HR 63 and O2 Sat 98%. Per wife, patient feels weak now but no c/o chest pain, dizziness or sob. Patient's wife advised that patient needed to go to the ED for an evaluation. Wife verbalized understanding of plan.

## 2015-12-23 NOTE — ED Notes (Signed)
Pt reports syncopal episode this am while pt wife was dressing left ear. Pt reports history of same a few weeks ago. Pt alert and oriented and reports generalized weakness. Pt denies any dizziness,n/v. Pt reports wife called EMS this am, EKG completed at that time. Sinus Rhythm noted and VSS at that time. Pt denies any chest pain sob. Speech clear. Steady gait noted to room. nad noted.

## 2015-12-23 NOTE — ED Provider Notes (Addendum)
CSN: [id number]     Arrival date & time 12/23/15  1118 History   First MD Initiated Contact with Patient 12/23/15 1149     Chief Complaint  Patient presents with  . Loss of Consciousness     (Consider location/radiation/quality/duration/timing/severity/associated sxs/prior Treatment) Patient is a 81 y.o. male presenting with syncope. The history is provided by the patient.  Loss of Consciousness Episode history:  Single Most recent episode:  Today Duration:  2 minutes Timing:  Constant Progression:  Resolved Chronicity:  New Witnessed: yes   Relieved by:  Lying down Worsened by:  Nothing tried Ineffective treatments:  None tried Associated symptoms: weakness (generalized)   Associated symptoms: no chest pain, no difficulty breathing, no fever, no headaches, no shortness of breath and no vomiting     82 year old male with past medical history of coronary artery disease, status post triple vessel bypass with subsequent known vessel stenosis who presents with syncopal episode. The patient states that he was sitting on a stool earlier today. His wife was dressing a recent biopsy site on his left ear. She left to get something to clean the area. He states he became to feel acutely lightheaded and dizzy. He states he did see his blood but he has seen his blood multiple times and has never had a history of syncope. He then felt like his heart "slow down" and he acutely syncopized. He is unable recall any chest pain, shortness of breath or palpitations prior to or since the episode. He was found down, with his chair flipped backwards, by his wife. Per report, he was initially unresponsive. He then slowly returned to his baseline. Currently, he denies any complaints. Of note, he does endorse increasing black stools at home as well as generalized weakness and fatigue with exertion. He states he feels generally "run down."  Past Medical History  Diagnosis Date  . Coronary atherosclerosis of native  coronary artery     Multivessel status post CABG, DES x 2 SVG to OM with staged DES SVG to RCA November 2015, DES to SVG to PDA 05/2015  . Essential hypertension, benign   . Carotid artery disease ([place])     123456 LICA and [id number] RICA - December 2013  . Sinus bradycardia     Asymptomatic  . Gout   . HOH (hard of hearing)    Past Surgical History  Procedure Laterality Date  . Coronary artery bypass graft  11/09/1999    LIMA to LAD, SVG to diagonal, SVG to OM1 and OM2, SVG to PDA  . Appendectomy      Ruptured  . Cataract extraction w/phaco Right 11/13/2012    Procedure: CATARACT EXTRACTION PHACO AND INTRAOCULAR LENS PLACEMENT (IOC);  Surgeon: Tonny Branch, MD;  Location: [facility];  Service: Ophthalmology;  Laterality: Right;  CDE:12.75  . Cataract extraction w/phaco Left 01/11/2013    Procedure: CATARACT EXTRACTION PHACO AND INTRAOCULAR LENS PLACEMENT (IOC);  Surgeon: Tonny Branch, MD;  Location: [facility];  Service: Ophthalmology;  Laterality: Left;  CDE: 21.13  . Left heart catheterization with coronary angiogram N/A 04/22/2014    Procedure: LEFT HEART CATHETERIZATION WITH CORONARY ANGIOGRAM;  Surgeon: Burnell Blanks, MD;  Location: [facility] CATH LAB;  Service: Cardiovascular;  Laterality: N/A;  . Percutaneous coronary stent intervention (pci-s) N/A 04/25/2014    Procedure: PERCUTANEOUS CORONARY STENT INTERVENTION (PCI-S);  Surgeon: Peter M Martinique, MD;  Location: [facility] CATH LAB;  Service: Cardiovascular;  Laterality: N/A;  . Colonoscopy  09/2013    Dr. Altamese Dilling  [place]: colitis descending colon and sigmoid colon.Bx ?Cdiff vs ischemic.  . Cardiac catheterization N/A 05/19/2015    Procedure: Left Heart Cath and Cors/Grafts Angiography;  Surgeon: Lorretta Harp, MD;  Location: [place] CV LAB;  Service: Cardiovascular;  Laterality: N/A;  . Cardiac catheterization N/A 05/19/2015    Procedure: Coronary Stent Intervention;  Surgeon: Lorretta Harp, MD;  Location: [place] CV LAB;  Service: Cardiovascular;   Laterality: N/A;  . Esophagogastroduodenoscopy N/A 05/20/2015    Procedure: ESOPHAGOGASTRODUODENOSCOPY (EGD);  Surgeon: Wilford Corner, MD;  Location: [facility] ENDOSCOPY;  Service: Endoscopy;  Laterality: N/A;   Family History  Problem Relation Age of Onset  . Cancer Father     colon, age 49s   Social History  Substance Use Topics  . Smoking status: Former Smoker -- 0.50 packs/day for 50 years    Types: Cigarettes    Start date: 01/08/1947    Quit date: 06/08/1999  . Smokeless tobacco: Never Used  . Alcohol Use: No    Review of Systems  Constitutional: Positive for fatigue. Negative for fever and chills.  HENT: Negative for congestion and rhinorrhea.   Eyes: Negative for visual disturbance.  Respiratory: Negative for cough, shortness of breath and wheezing.   Cardiovascular: Positive for syncope. Negative for chest pain and leg swelling.  Gastrointestinal: Negative for vomiting, abdominal pain and diarrhea.  Genitourinary: Negative for dysuria and flank pain.  Musculoskeletal: Negative for neck pain.  Skin: Negative for rash.  Neurological: Positive for syncope and weakness (generalized). Negative for headaches.      Allergies  Protonix  Home Medications   Prior to Admission medications   Medication Sig Start Date End Date Taking? Authorizing Provider  albuterol (PROVENTIL HFA;VENTOLIN HFA) 108 (90 Base) MCG/ACT inhaler Inhale 1-2 puffs into the lungs every 6 (six) hours as needed for wheezing or shortness of breath.   Yes Historical Provider, MD  AMBULATORY NON FORMULARY MEDICATION Take 90 mg by mouth 2 (two) times daily. Medication Name: Brilinta 90 mg BID (TWILIGHT Research study provided do not fill) 05/20/15  Yes Burnell Blanks, MD  AMBULATORY NON FORMULARY MEDICATION Take 81 mg by mouth daily. Medication Name: ASA 81 mg Daily or PLACEBO (TWILIGHT Research study provided) 08/13/15  Yes Burnell Blanks, MD  atorvastatin (LIPITOR) 10 MG tablet Take 1 tablet  (10 mg total) by mouth daily. 11/27/15  Yes Satira Sark, MD  COLCRYS 0.6 MG tablet Take 1 tablet by mouth Once daily as needed (for gout).  03/05/12  Yes Historical Provider, MD  cyclobenzaprine (FLEXERIL) 10 MG tablet Take 10 mg by mouth at bedtime.   Yes Historical Provider, MD  esomeprazole (NEXIUM) 40 MG capsule Take 40 mg by mouth daily as needed.   Yes Historical Provider, MD  fluticasone (FLONASE) 50 MCG/ACT nasal spray Place 1-2 sprays into both nostrils daily as needed. 04/04/15  Yes Historical Provider, MD  fluticasone (FLOVENT HFA) 44 MCG/ACT inhaler Inhale 2 puffs into the lungs 2 (two) times daily.   Yes Historical Provider, MD  isosorbide mononitrate (IMDUR) 60 MG 24 hr tablet Take 0.5 tablets (30 mg total) by mouth daily. 08/19/15  Yes Satira Sark, MD  loratadine (CLARITIN) 10 MG tablet Take 10 mg by mouth at bedtime.   Yes Historical Provider, MD  metoprolol tartrate (LOPRESSOR) 25 MG tablet Take 1 tablet (25 mg total) by mouth 2 (two) times daily. 09/30/15  Yes Satira Sark, MD  nitroGLYCERIN (NITROSTAT) 0.4 MG SL tablet Place 1 tablet (0.4  mg total) under the tongue every 5 (five) minutes x 3 doses as needed. If no relief after 3rd dose, proceed to the ED for an evalutation 06/10/15  Yes Satira Sark, MD  tamsulosin (FLOMAX) 0.4 MG CAPS capsule Take 0.4 mg by mouth at bedtime.   Yes Historical Provider, MD   BP 130/78 mmHg  Pulse 76  Temp(Src) 97.5 F (36.4 C) (Oral)  Resp 16  Ht 5' 3.5" (1.613 m)  Wt 140 lb (63.504 kg)  BMI 24.41 kg/m2  SpO2 99% Physical Exam  Constitutional: He is oriented to person, place, and time. He appears well-developed and well-nourished. No distress.  HENT:  Head: Normocephalic.  Mouth/Throat: No oropharyngeal exudate.  Subconjunctival pallor  Eyes: Conjunctivae are normal. Pupils are equal, round, and reactive to light.  Neck: Normal range of motion. Neck supple.  Cardiovascular: Normal rate, regular rhythm, normal heart  sounds and intact distal pulses.  Exam reveals no friction rub.   No murmur heard. Pulmonary/Chest: Effort normal and breath sounds normal. No respiratory distress. He has no wheezes. He has no rales.  Abdominal: Soft. He exhibits no distension. There is no tenderness.  Genitourinary:  Soft brown stool in rectal vault. Heme-occult positive.  Musculoskeletal: He exhibits no edema.  Neurological: He is alert and oriented to person, place, and time. He has normal strength. He displays normal reflexes. No cranial nerve deficit or sensory deficit. Gait normal. GCS eye subscore is 4. GCS verbal subscore is 5. GCS motor subscore is 6.  Skin: Skin is warm. No rash noted.  Nursing note and vitals reviewed.   ED Course  Procedures (including critical care time) Labs Review Labs Reviewed  BASIC METABOLIC PANEL - Abnormal; Notable for the following:    Glucose, Bld 155 (*)    BUN 26 (*)    Creatinine, Ser 1.35 (*)    Calcium 8.5 (*)    GFR calc non Af Amer 49 (*)    GFR calc Af Amer 56 (*)    All other components within normal limits  CBC - Abnormal; Notable for the following:    RBC 3.11 (*)    Hemoglobin 7.2 (*)    HCT 24.3 (*)    MCH 23.2 (*)    MCHC 29.6 (*)    All other components within normal limits  URINALYSIS, ROUTINE W REFLEX MICROSCOPIC (NOT AT [facility]) - Abnormal; Notable for the following:    Color, Urine STRAW (*)    All other components within normal limits  HEPATIC FUNCTION PANEL - Abnormal; Notable for the following:    Bilirubin, Direct <0.1 (*)    All other components within normal limits  CBG MONITORING, ED - Abnormal; Notable for the following:    Glucose-Capillary 167 (*)    All other components within normal limits  MAGNESIUM  OCCULT BLOOD X 1 CARD TO LAB, STOOL  I-STAT TROPOININ, ED  TYPE AND SCREEN  PREPARE RBC (CROSSMATCH)  ABO/RH    Imaging Review Dg Chest 2 View  12/23/2015  CLINICAL DATA:  Syncope and shortness of breath EXAM: CHEST  2 VIEW COMPARISON:   June 25, 2015 FINDINGS: The lungs are clear. The heart size and pulmonary vascularity are normal. No adenopathy. There is atherosclerotic calcification throughout the aorta. Patient is status post coronary artery bypass grafting. No bone lesions. IMPRESSION: No edema or consolidation. Aortic atherosclerosis. Patient is status post coronary artery bypass grafting. Electronically Signed   By: Lowella Grip III M.D.   On: 12/23/2015 16:16  Ct Head Wo Contrast  12/23/2015  CLINICAL DATA:  Syncopal episode. EXAM: CT HEAD WITHOUT CONTRAST TECHNIQUE: Contiguous axial images were obtained from the base of the skull through the vertex without intravenous contrast. COMPARISON:  06/07/2015 FINDINGS: The ventricles, cisterns and other CSF spaces are within normal. There is no mass, mass effect, shift of midline structures or acute hemorrhage. No evidence of acute infarction. Remaining bones and soft tissues are within normal. IMPRESSION: No acute intracranial findings. Electronically Signed   By: Marin Olp M.D.   On: 12/23/2015 16:06   I have personally reviewed and evaluated these images and lab results as part of my medical decision-making.   EKG Interpretation   Date/Time:  Tuesday December 23 2015 11:38:53 EDT Ventricular Rate:  52 PR Interval:    QRS Duration: 94 QT Interval:  453 QTC Calculation: 422 R Axis:   18 Text Interpretation:  Sinus rhythm Ventricular premature complex  Anteroseptal infarct, old ST flattening in lead III with TWI, new from  previous but previously seen No reciprocal changes in lateral leads No ST  elevations Confirmed by Ellender Hose MD, Lysbeth Galas [phone]) on 12/23/2015 1:53:24 PM      MDM  81 year old male with past medical history of coronary artery disease status post CABG who presents with acute syncope. Patient had mild prodrome of dizziness followed by syncope and fall. Denies any chest pain, palpitations, shortness of breath, nausea or focal weakness or numbness. He  is currently at his baseline. On arrival, patient is bradycardic but otherwise hemodynamically stable. EKG shows no acute ischemia or infarct. Primary concern at this is possible cardiogenic syncope, symptomatic bradycardia, or other arrhythmia. No signs of ACS on EKG and pt denies any CP. No focal weakness, numbness, and neuro exam non-focal currently - do not suspect CVA or seizure. DDx includes symptomatic anemia, as pt does appear anemic and has heme-occult positive stools. Must also consider vagal reaction to dressing change/seeing blood, as well as possible orthostasis.  CT head shows NAICA, obtained due to head trauam on Brilinta. CXR negative - no signs of PTX, rib fx, or pulmonary edema or PNA. Labs reviewed as above. CBC shows significant anemia with hemoglobin 7.2. His baseline appears to be 9-10. In the setting of Hemoccult positive stools and antiplatelet use, suspect patient has chronic lower GI bleed with subsequent symptomatic anemia. LFTs are normal. Electrolytes are acceptable. Urinalysis unremarkable. Patient remains bradycardic despite position changes. I suspect pt sx due to combination of anemia and inability to compensate for it due to baseline bradycardia. Discussed case with Dr. Domenic Polite, pt's Cardiologist, who recommends decreasing beta blocker dose by half. Will admit to Hospitalist for syncope observation, telemetry, transfusion, and monitoring of medication changes. Pt in agreement. Consented for blood in ED.  Clinical Impression: 1. Symptomatic anemia   2. Fall   3. Melena   4. Coronary atherosclerosis due to calcified coronary lesion   5. Syncope and collapse   6. Bradycardia   7. High risk medication use     Disposition: Admit  Condition: Stable    Duffy Bruce, MD 12/23/15 1553  Duffy Bruce, MD 12/23/15 1630

## 2015-12-23 NOTE — Progress Notes (Signed)
REVIEWED-NO ADDITIONAL RECOMMENDATIONS. 

## 2015-12-23 NOTE — H&P (Signed)
History and Physical    Billy [name] [id number] DOB: 1935/02/19 DOA: 12/23/2015  Referring MD/NP/PA: Ellender Hose EDP PCP: Curlene Labrum, MD  Patient coming from: Home  Chief Complaint: Syncope  HPI: Billy [name] is a 81 y.o. male was at home sitting on a barstool while his wife was tending to his ear wound from a recent biopsy and suddenly he felt dizzy and blacked out. His wife found him on the floor with the stool tipped over. He came to after about 2 minutes. No loss of bowel/bladder, no biting of tongue. Found to be bradycardic with HRs in the 40-50s. Also hb of 7.9, down 2 grams with +FOBT. CT Head negative.  Past Medical/Surgical History: Past Medical History  Diagnosis Date  . Coronary atherosclerosis of native coronary artery     Multivessel status post CABG, DES x 2 SVG to OM with staged DES SVG to RCA November 2015, DES to SVG to PDA 05/2015  . Essential hypertension, benign   . Carotid artery disease ([place])     123456 LICA and [id number] RICA - December 2013  . Sinus bradycardia     Asymptomatic  . Gout   . HOH (hard of hearing)     Past Surgical History  Procedure Laterality Date  . Coronary artery bypass graft  11/09/1999    LIMA to LAD, SVG to diagonal, SVG to OM1 and OM2, SVG to PDA  . Appendectomy      Ruptured  . Cataract extraction w/phaco Right 11/13/2012    Procedure: CATARACT EXTRACTION PHACO AND INTRAOCULAR LENS PLACEMENT (IOC);  Surgeon: Tonny Branch, MD;  Location: [facility];  Service: Ophthalmology;  Laterality: Right;  CDE:12.75  . Cataract extraction w/phaco Left 01/11/2013    Procedure: CATARACT EXTRACTION PHACO AND INTRAOCULAR LENS PLACEMENT (IOC);  Surgeon: Tonny Branch, MD;  Location: [facility];  Service: Ophthalmology;  Laterality: Left;  CDE: 21.13  . Left heart catheterization with coronary angiogram N/A 04/22/2014    Procedure: LEFT HEART CATHETERIZATION WITH CORONARY ANGIOGRAM;  Surgeon: Burnell Blanks, MD;  Location: [facility] CATH LAB;  Service:  Cardiovascular;  Laterality: N/A;  . Percutaneous coronary stent intervention (pci-s) N/A 04/25/2014    Procedure: PERCUTANEOUS CORONARY STENT INTERVENTION (PCI-S);  Surgeon: Peter M Martinique, MD;  Location: [facility] CATH LAB;  Service: Cardiovascular;  Laterality: N/A;  . Colonoscopy  09/2013    Dr. Burke Keels: colitis descending colon and sigmoid colon.Bx ?Cdiff vs ischemic.  . Cardiac catheterization N/A 05/19/2015    Procedure: Left Heart Cath and Cors/Grafts Angiography;  Surgeon: Lorretta Harp, MD;  Location: [place] CV LAB;  Service: Cardiovascular;  Laterality: N/A;  . Cardiac catheterization N/A 05/19/2015    Procedure: Coronary Stent Intervention;  Surgeon: Lorretta Harp, MD;  Location: [place] CV LAB;  Service: Cardiovascular;  Laterality: N/A;  . Esophagogastroduodenoscopy N/A 05/20/2015    Procedure: ESOPHAGOGASTRODUODENOSCOPY (EGD);  Surgeon: Wilford Corner, MD;  Location: [facility] ENDOSCOPY;  Service: Endoscopy;  Laterality: N/A;    Social History:  reports that he quit smoking about 16 years ago. His smoking use included Cigarettes. He started smoking about 69 years ago. He has a 25 pack-year smoking history. He has never used smokeless tobacco. He reports that he does not drink alcohol or use illicit drugs.  Allergies: Allergies  Allergen Reactions  . Protonix [Pantoprazole Sodium] Itching and Rash    Family History:  Family History  Problem Relation Age of Onset  . Cancer Father     colon, age 69s  Prior to Admission medications   Medication Sig Start Date End Date Taking? Authorizing Provider  albuterol (PROVENTIL HFA;VENTOLIN HFA) 108 (90 Base) MCG/ACT inhaler Inhale 1-2 puffs into the lungs every 6 (six) hours as needed for wheezing or shortness of breath.   Yes Historical Provider, MD  AMBULATORY NON FORMULARY MEDICATION Take 90 mg by mouth 2 (two) times daily. Medication Name: Brilinta 90 mg BID (TWILIGHT Research study provided do not fill) 05/20/15  Yes  Burnell Blanks, MD  AMBULATORY NON FORMULARY MEDICATION Take 81 mg by mouth daily. Medication Name: ASA 81 mg Daily or PLACEBO (TWILIGHT Research study provided) 08/13/15  Yes Burnell Blanks, MD  atorvastatin (LIPITOR) 10 MG tablet Take 1 tablet (10 mg total) by mouth daily. 11/27/15  Yes Satira Sark, MD  COLCRYS 0.6 MG tablet Take 1 tablet by mouth Once daily as needed (for gout).  03/05/12  Yes Historical Provider, MD  cyclobenzaprine (FLEXERIL) 10 MG tablet Take 10 mg by mouth at bedtime.   Yes Historical Provider, MD  esomeprazole (NEXIUM) 40 MG capsule Take 40 mg by mouth daily as needed.   Yes Historical Provider, MD  fluticasone (FLONASE) 50 MCG/ACT nasal spray Place 1-2 sprays into both nostrils daily as needed. 04/04/15  Yes Historical Provider, MD  fluticasone (FLOVENT HFA) 44 MCG/ACT inhaler Inhale 2 puffs into the lungs 2 (two) times daily.   Yes Historical Provider, MD  isosorbide mononitrate (IMDUR) 60 MG 24 hr tablet Take 0.5 tablets (30 mg total) by mouth daily. 08/19/15  Yes Satira Sark, MD  loratadine (CLARITIN) 10 MG tablet Take 10 mg by mouth at bedtime.   Yes Historical Provider, MD  metoprolol tartrate (LOPRESSOR) 25 MG tablet Take 1 tablet (25 mg total) by mouth 2 (two) times daily. 09/30/15  Yes Satira Sark, MD  nitroGLYCERIN (NITROSTAT) 0.4 MG SL tablet Place 1 tablet (0.4 mg total) under the tongue every 5 (five) minutes x 3 doses as needed. If no relief after 3rd dose, proceed to the ED for an evalutation 06/10/15  Yes Satira Sark, MD  tamsulosin (FLOMAX) 0.4 MG CAPS capsule Take 0.4 mg by mouth at bedtime.   Yes Historical Provider, MD    Review of Systems:  Constitutional: Denies fever, chills, diaphoresis, appetite change and fatigue.  HEENT: Denies photophobia, eye pain, redness, hearing loss, ear pain, congestion, sore throat, rhinorrhea, sneezing, mouth sores, trouble swallowing, neck pain, neck stiffness and tinnitus.   Respiratory:  Denies SOB, DOE, cough, chest tightness,  and wheezing.   Cardiovascular: Denies chest pain, palpitations and leg swelling.  Gastrointestinal: Denies nausea, vomiting, abdominal pain, diarrhea, constipation, blood in stool and abdominal distention.  Genitourinary: Denies dysuria, urgency, frequency, hematuria, flank pain and difficulty urinating.  Endocrine: Denies: hot or cold intolerance, sweats, changes in hair or nails, polyuria, polydipsia. Musculoskeletal: Denies myalgias, back pain, joint swelling, arthralgias and gait problem.  Skin: Denies pallor, rash and wound.  Neurological: Denies dizziness, seizures, syncope, weakness, light-headedness, numbness and headaches.  Hematological: Denies adenopathy. Easy bruising, personal or family bleeding history  Psychiatric/Behavioral: Denies suicidal ideation, mood changes, confusion, nervousness, sleep disturbance and agitation    Physical Exam: Filed Vitals:   12/23/15 1230 12/23/15 1245 12/23/15 1248 12/23/15 1440  BP: 100/69  130/78 145/65  Pulse:  57 76 55  Temp:    97.8 F (36.6 C)  TempSrc:    Oral  Resp: 21 15 16 18   Height:      Weight:  SpO2:  100% 99% 100%     Constitutional: NAD, calm, comfortable Eyes: PERRL, lids and conjunctivae normal ENMT: Mucous membranes are moist. Posterior pharynx clear of any exudate or lesions.Normal dentition.  Neck: normal, supple, no masses, no thyromegaly Respiratory: clear to auscultation bilaterally, no wheezing, no crackles. Normal respiratory effort. No accessory muscle use.  Cardiovascular: Bradycardic, no murmurs / rubs / gallops. No extremity edema. 2+ pedal pulses. No carotid bruits.  Abdomen: no tenderness, no masses palpated. No hepatosplenomegaly. Bowel sounds positive.  Musculoskeletal: no clubbing / cyanosis. No joint deformity upper and lower extremities. Good ROM, no contractures. Normal muscle tone.  Skin: no rashes, lesions, ulcers. No induration Neurologic: CN 2-12  grossly intact. Sensation intact, DTR normal. Strength 5/5 in all 4.  Psychiatric: Normal judgment and insight. Alert and oriented x 3. Normal mood.    Labs on Admission: I have personally reviewed the following labs and imaging studies  CBC:  Recent Labs Lab 12/23/15 1141  WBC 5.3  HGB 7.2*  HCT 24.3*  MCV 78.1  PLT 123XX123   Basic Metabolic Panel:  Recent Labs Lab 12/23/15 1141  NA 135  K 4.1  CL 108  CO2 25  GLUCOSE 155*  BUN 26*  CREATININE 1.35*  CALCIUM 8.5*  MG 2.1   GFR: Estimated Creatinine Clearance: 37.1 mL/min (by C-G formula based on Cr of 1.35). Liver Function Tests:  Recent Labs Lab 12/23/15 1141  AST 25  ALT 23  ALKPHOS 50  BILITOT 0.6  PROT 6.8  ALBUMIN 3.8   No results for input(s): LIPASE, AMYLASE in the last 168 hours. No results for input(s): AMMONIA in the last 168 hours. Coagulation Profile: No results for input(s): INR, PROTIME in the last 168 hours. Cardiac Enzymes: No results for input(s): CKTOTAL, CKMB, CKMBINDEX, TROPONINI in the last 168 hours. BNP (last 3 results) No results for input(s): PROBNP in the last 8760 hours. HbA1C: No results for input(s): HGBA1C in the last 72 hours. CBG:  Recent Labs Lab 12/23/15 1207  GLUCAP 167*   Lipid Profile: No results for input(s): CHOL, HDL, LDLCALC, TRIG, CHOLHDL, LDLDIRECT in the last 72 hours. Thyroid Function Tests: No results for input(s): TSH, T4TOTAL, FREET4, T3FREE, THYROIDAB in the last 72 hours. Anemia Panel: No results for input(s): VITAMINB12, FOLATE, FERRITIN, TIBC, IRON, RETICCTPCT in the last 72 hours. Urine analysis:    Component Value Date/Time   COLORURINE STRAW* 12/23/2015 1230   APPEARANCEUR CLEAR 12/23/2015 1230   LABSPEC 1.020 12/23/2015 1230   PHURINE 6.0 12/23/2015 1230   GLUCOSEU NEGATIVE 12/23/2015 1230   HGBUR NEGATIVE 12/23/2015 1230   BILIRUBINUR NEGATIVE 12/23/2015 1230   KETONESUR NEGATIVE 12/23/2015 1230   PROTEINUR NEGATIVE 12/23/2015 1230    NITRITE NEGATIVE 12/23/2015 1230   LEUKOCYTESUR NEGATIVE 12/23/2015 1230   Sepsis Labs: @LABRCNTIP (procalcitonin:4,lacticidven:4) )No results found for this or any previous visit (from the past 240 hour(s)).   Radiological Exams on Admission: Dg Chest 2 View  12/23/2015  CLINICAL DATA:  Syncope and shortness of breath EXAM: CHEST  2 VIEW COMPARISON:  June 25, 2015 FINDINGS: The lungs are clear. The heart size and pulmonary vascularity are normal. No adenopathy. There is atherosclerotic calcification throughout the aorta. Patient is status post coronary artery bypass grafting. No bone lesions. IMPRESSION: No edema or consolidation. Aortic atherosclerosis. Patient is status post coronary artery bypass grafting. Electronically Signed   By: Lowella Grip III M.D.   On: 12/23/2015 16:16   Ct Head Wo Contrast  12/23/2015  CLINICAL  DATA:  Syncopal episode. EXAM: CT HEAD WITHOUT CONTRAST TECHNIQUE: Contiguous axial images were obtained from the base of the skull through the vertex without intravenous contrast. COMPARISON:  06/07/2015 FINDINGS: The ventricles, cisterns and other CSF spaces are within normal. There is no mass, mass effect, shift of midline structures or acute hemorrhage. No evidence of acute infarction. Remaining bones and soft tissues are within normal. IMPRESSION: No acute intracranial findings. Electronically Signed   By: Marin Olp M.D.   On: 12/23/2015 16:06    EKG: Independently reviewed. Sinus bradycardia at a rate of 52 with a PVC  Assessment/Plan Principal Problem:   Syncope Active Problems:   Melena   Symptomatic anemia    Syncope -Likely due to iatrogenic bradycardia (on metoprolol) in addition to anemia. -Case discussed by EDP with Dr. Domenic Polite; plan to decrease BB in half from 25 to 12.5 mg BID. -Transfuse. -No further work up at present.  Symptomatic Anemia/Melena -Transfuse 1 unit of PRBCs. -Protonix BID -Request GI consultation in  am.  CAD -stable, no CP   DVT prophylaxis: Lovenox  Code Status: Full code  Family Communication: patient only  Disposition Plan: hope for DC home in 24-48 hours  Consults called: GI  Admission status: observation    Time Spent: 80 minutes  Lelon Frohlich MD [facility] Pager [phone]  If 7PM-7AM, please contact night-coverage [email or web address] Password [facility]  12/23/2015, 4:59 PM

## 2015-12-24 ENCOUNTER — Encounter (HOSPITAL_COMMUNITY)
Admission: EM | Disposition: A | Discharge status: Home / Self Care | Payer: Self-pay | Source: Home / Self Care | Attending: Emergency Medicine

## 2015-12-24 DIAGNOSIS — R55 Syncope and collapse: Secondary | ICD-10-CM | POA: Diagnosis not present

## 2015-12-24 DIAGNOSIS — E785 Hyperlipidemia, unspecified: Secondary | ICD-10-CM | POA: Diagnosis not present

## 2015-12-24 DIAGNOSIS — I25810 Atherosclerosis of coronary artery bypass graft(s) without angina pectoris: Secondary | ICD-10-CM

## 2015-12-24 DIAGNOSIS — D649 Anemia, unspecified: Secondary | ICD-10-CM | POA: Diagnosis not present

## 2015-12-24 DIAGNOSIS — I25700 Atherosclerosis of coronary artery bypass graft(s), unspecified, with unstable angina pectoris: Secondary | ICD-10-CM

## 2015-12-24 DIAGNOSIS — K921 Melena: Secondary | ICD-10-CM | POA: Diagnosis not present

## 2015-12-24 DIAGNOSIS — D62 Acute posthemorrhagic anemia: Secondary | ICD-10-CM | POA: Diagnosis not present

## 2015-12-24 DIAGNOSIS — D500 Iron deficiency anemia secondary to blood loss (chronic): Secondary | ICD-10-CM | POA: Diagnosis not present

## 2015-12-24 DIAGNOSIS — R001 Bradycardia, unspecified: Secondary | ICD-10-CM | POA: Diagnosis not present

## 2015-12-24 DIAGNOSIS — I10 Essential (primary) hypertension: Secondary | ICD-10-CM | POA: Diagnosis not present

## 2015-12-24 DIAGNOSIS — D509 Iron deficiency anemia, unspecified: Secondary | ICD-10-CM | POA: Diagnosis not present

## 2015-12-24 HISTORY — PX: GIVENS CAPSULE STUDY: SHX5432

## 2015-12-24 LAB — CBC
HEMATOCRIT: 27.6 % — AB (ref 39.0–52.0)
Hemoglobin: 8.4 g/dL — ABNORMAL LOW (ref 13.0–17.0)
MCH: 24.2 pg — ABNORMAL LOW (ref 26.0–34.0)
MCHC: 30.4 g/dL (ref 30.0–36.0)
MCV: 79.5 fL (ref 78.0–100.0)
PLATELETS: 281 10*3/uL (ref 150–400)
RBC: 3.47 MIL/uL — AB (ref 4.22–5.81)
RDW: 15.5 % (ref 11.5–15.5)
WBC: 5.3 10*3/uL (ref 4.0–10.5)

## 2015-12-24 LAB — TYPE AND SCREEN
ABO/RH(D): A POS
ANTIBODY SCREEN: NEGATIVE
Unit division: 0

## 2015-12-24 LAB — BASIC METABOLIC PANEL
Anion gap: 8 (ref 5–15)
BUN: 23 mg/dL — ABNORMAL HIGH (ref 6–20)
CHLORIDE: 110 mmol/L (ref 101–111)
CO2: 23 mmol/L (ref 22–32)
CREATININE: 1.17 mg/dL (ref 0.61–1.24)
Calcium: 9.1 mg/dL (ref 8.9–10.3)
GFR calc non Af Amer: 58 mL/min — ABNORMAL LOW (ref >60)
Glucose, Bld: 97 mg/dL (ref 65–99)
POTASSIUM: 4.1 mmol/L (ref 3.5–5.1)
SODIUM: 141 mmol/L (ref 135–145)

## 2015-12-24 LAB — TSH: TSH: 3.947 u[IU]/mL (ref 0.350–4.500)

## 2015-12-24 LAB — OCCULT BLOOD, POC DEVICE: Fecal Occult Bld: POSITIVE — AB

## 2015-12-24 SURGERY — IMAGING PROCEDURE, GI TRACT, INTRALUMINAL, VIA CAPSULE

## 2015-12-24 MED ORDER — METOCLOPRAMIDE HCL 5 MG/ML IJ SOLN
10.0000 mg | Freq: Once | INTRAMUSCULAR | Status: AC
  Administered 2015-12-24: 10 mg via INTRAVENOUS
  Filled 2015-12-24: qty 2

## 2015-12-24 NOTE — Progress Notes (Signed)
Dr. Laural Golden called and gave order for patient to have a full liquid diet.

## 2015-12-24 NOTE — Care Management Obs Status (Signed)
[place] NOTIFICATION   Patient Details  Name: QUINTEZ SOBIECK MRN: [id number] Date of Birth: [birth year]   Medicare Observation Status Notification Given:  Yes    Jeralyn Nolden, Chauncey Reading, RN 12/24/2015, 2:12 PM

## 2015-12-24 NOTE — Consult Note (Signed)
Reason for Consult: anemia Referring Physician: Hospitalist's services  Billy [name] is an 81 y.o. male.  HPI:  Admitted thru the ED Tuesday morning after having a syncopal episode at home. Sitting at bar and fell backwards. He had been feeling weak and legs giving out. Symptoms for months. Worse in the morning. Noted on admission hemoglobin of 7.2.  Hemoglobin 8.4 this am. In ED, he was heme positive.  He says sometimes his stools have been black.  He says the last few BMs have been normal. Iron studies reveal IDA. Appetite is good.  No weight loss. No dysphagia.  No acid reflux. He has a BM about 1-2 times a day. Stools are soft. He denies any diarrhea. No abdominal pain.  No NSAIDS.  He thinks he has been on an antibiotic in the past month but he cannot tell me why.  Stool brown in the ED and guaiac positive.  Hx significant for CAD.  Admitted to [facility] in December with NSTEMI and noted to be anemic. Also patient gave hx of melena off and on. EGD by Dr. Michail Sermon was normal. Patient had been on Brilanta till end a of November 2016. Last colonoscopy 09/27/2012 Dr .Anthony Sar: Reason: rectal bleeding, abdominal pain. Findings: Colitis in descending and sigmoid colon, otherwise normal. Biopsy: Acute colitis. Differential diagnosis: C diff and ischemic colitis. Patient states he was treated with antibiotics. EGD 05/20/2015 Dr. Wilford Corner: anemia: Normal EGD.  Past Medical History  Diagnosis Date  . Coronary atherosclerosis of native coronary artery     Multivessel status post CABG, DES x 2 SVG to OM with staged DES SVG to RCA November 2015, DES to SVG to PDA 05/2015  . Essential hypertension, benign   . Carotid artery disease ([place])     53-66% LICA and 4-40% RICA - December 2013  . Sinus bradycardia     Asymptomatic  . Gout   . HOH (hard of hearing)     Past Surgical History  Procedure Laterality Date  . Coronary artery bypass graft  11/09/1999    LIMA to LAD, SVG to diagonal, SVG to OM1  and OM2, SVG to PDA  . Appendectomy      Ruptured  . Cataract extraction w/phaco Right 11/13/2012    Procedure: CATARACT EXTRACTION PHACO AND INTRAOCULAR LENS PLACEMENT (IOC);  Surgeon: Tonny Branch, MD;  Location: [facility] ORS;  Service: Ophthalmology;  Laterality: Right;  CDE:12.75  . Cataract extraction w/phaco Left 01/11/2013    Procedure: CATARACT EXTRACTION PHACO AND INTRAOCULAR LENS PLACEMENT (IOC);  Surgeon: Tonny Branch, MD;  Location: [facility] ORS;  Service: Ophthalmology;  Laterality: Left;  CDE: 21.13  . Left heart catheterization with coronary angiogram N/A 04/22/2014    Procedure: LEFT HEART CATHETERIZATION WITH CORONARY ANGIOGRAM;  Surgeon: Burnell Blanks, MD;  Location: [facility] CATH LAB;  Service: Cardiovascular;  Laterality: N/A;  . Percutaneous coronary stent intervention (pci-s) N/A 04/25/2014    Procedure: PERCUTANEOUS CORONARY STENT INTERVENTION (PCI-S);  Surgeon: Peter M Martinique, MD;  Location: [facility] CATH LAB;  Service: Cardiovascular;  Laterality: N/A;  . Colonoscopy  09/2013    Dr. Burke Keels: colitis descending colon and sigmoid colon.Bx ?Cdiff vs ischemic.  . Cardiac catheterization N/A 05/19/2015    Procedure: Left Heart Cath and Cors/Grafts Angiography;  Surgeon: Lorretta Harp, MD;  Location: [place] CV LAB;  Service: Cardiovascular;  Laterality: N/A;  . Cardiac catheterization N/A 05/19/2015    Procedure: Coronary Stent Intervention;  Surgeon: Lorretta Harp, MD;  Location: [place]  CV LAB;  Service: Cardiovascular;  Laterality: N/A;  . Esophagogastroduodenoscopy N/A 05/20/2015    Procedure: ESOPHAGOGASTRODUODENOSCOPY (EGD);  Surgeon: Wilford Corner, MD;  Location: [facility] ENDOSCOPY;  Service: Endoscopy;  Laterality: N/A;    Family History  Problem Relation Age of Onset  . Cancer Father     colon, age 81s    Social History:  reports that he quit smoking about 16 years ago. His smoking use included Cigarettes. He started smoking about 69 years ago. He has a 25 pack-year  smoking history. He has never used smokeless tobacco. He reports that he does not drink alcohol or use illicit drugs.  Allergies:  Allergies  Allergen Reactions  . Protonix [Pantoprazole Sodium] Itching and Rash    Medications: I have reviewed the patient's current medications.  Results for orders placed or performed during the hospital encounter of 12/23/15 (from the past 48 hour(s))  Basic metabolic panel     Status: Abnormal   Collection Time: 12/23/15 11:41 AM  Result Value Ref Range   Sodium 135 135 - 145 mmol/L   Potassium 4.1 3.5 - 5.1 mmol/L   Chloride 108 101 - 111 mmol/L   CO2 25 22 - 32 mmol/L   Glucose, Bld 155 (H) 65 - 99 mg/dL   BUN 26 (H) 6 - 20 mg/dL   Creatinine, Ser 1.35 (H) 0.61 - 1.24 mg/dL   Calcium 8.5 (L) 8.9 - 10.3 mg/dL   GFR calc non Af Amer 49 (L) >60 mL/min   GFR calc Af Amer 56 (L) >60 mL/min    Comment: (NOTE) The eGFR has been calculated using the CKD EPI equation. This calculation has not been validated in all clinical situations. eGFR's persistently <60 mL/min signify possible Chronic Kidney Disease.   CBC     Status: Abnormal   Collection Time: 12/23/15 11:41 AM  Result Value Ref Range   WBC 5.3 4.0 - 10.5 K/uL   RBC 3.11 (L) 4.22 - 5.81 MIL/uL   Hemoglobin 7.2 (L) 13.0 - 17.0 g/dL   HCT 24.3 (L) 39.0 - 52.0 %   MCV 78.1 78.0 - 100.0 fL   MCH 23.2 (L) 26.0 - 34.0 pg   MCHC 29.6 (L) 30.0 - 36.0 g/dL   RDW 15.4 11.5 - 15.5 %   Platelets 279 150 - 400 K/uL  Hepatic function panel     Status: Abnormal   Collection Time: 12/23/15 11:41 AM  Result Value Ref Range   Total Protein 6.8 6.5 - 8.1 g/dL   Albumin 3.8 3.5 - 5.0 g/dL   AST 25 15 - 41 U/L   ALT 23 17 - 63 U/L   Alkaline Phosphatase 50 38 - 126 U/L   Total Bilirubin 0.6 0.3 - 1.2 mg/dL   Bilirubin, Direct <0.1 (L) 0.1 - 0.5 mg/dL   Indirect Bilirubin NOT CALCULATED 0.3 - 0.9 mg/dL  Magnesium     Status: None   Collection Time: 12/23/15 11:41 AM  Result Value Ref Range    Magnesium 2.1 1.7 - 2.4 mg/dL  CBG monitoring, ED     Status: Abnormal   Collection Time: 12/23/15 12:07 PM  Result Value Ref Range   Glucose-Capillary 167 (H) 65 - 99 mg/dL  Urinalysis, Routine w reflex microscopic     Status: Abnormal   Collection Time: 12/23/15 12:30 PM  Result Value Ref Range   Color, Urine STRAW (A) YELLOW   APPearance CLEAR CLEAR   Specific Gravity, Urine 1.020 1.005 - 1.030   pH 6.0  5.0 - 8.0   Glucose, UA NEGATIVE NEGATIVE mg/dL   Hgb urine dipstick NEGATIVE NEGATIVE   Bilirubin Urine NEGATIVE NEGATIVE   Ketones, ur NEGATIVE NEGATIVE mg/dL   Protein, ur NEGATIVE NEGATIVE mg/dL   Nitrite NEGATIVE NEGATIVE   Leukocytes, UA NEGATIVE NEGATIVE    Comment: MICROSCOPIC NOT DONE ON URINES WITH NEGATIVE PROTEIN, BLOOD, LEUKOCYTES, NITRITE, OR GLUCOSE <1000 mg/dL.  I-Stat Troponin, ED (not at [facility])     Status: None   Collection Time: 12/23/15 12:39 PM  Result Value Ref Range   Troponin i, poc 0.01 0.00 - 0.08 ng/mL   Comment 3            Comment: Due to the release kinetics of cTnI, a negative result within the first hours of the onset of symptoms does not rule out myocardial infarction with certainty. If myocardial infarction is still suspected, repeat the test at appropriate intervals.   Type and screen [facility]     Status: None (Preliminary result)   Collection Time: 12/23/15  1:00 PM  Result Value Ref Range   ABO/RH(D) A POS    Antibody Screen NEG    Sample Expiration 12/26/2015    Unit Number [id number]    Blood Component Type RED CELLS,LR    Unit division 00    Status of Unit ISSUED    Transfusion Status OK TO TRANSFUSE    Crossmatch Result Compatible   ABO/Rh     Status: None   Collection Time: 12/23/15  1:00 PM  Result Value Ref Range   ABO/RH(D) A POS   Vitamin B12     Status: None   Collection Time: 12/23/15  1:00 PM  Result Value Ref Range   Vitamin B-12 299 180 - 914 pg/mL    Comment: (NOTE) This assay is not validated for  testing neonatal or myeloproliferative syndrome specimens for Vitamin B12 levels. Performed at [facility]   Folate     Status: None   Collection Time: 12/23/15  1:00 PM  Result Value Ref Range   Folate 21.7 >5.9 ng/mL    Comment: Performed at [facility]  Iron and TIBC     Status: Abnormal   Collection Time: 12/23/15  1:00 PM  Result Value Ref Range   Iron 20 (L) 45 - 182 ug/dL   TIBC 535 (H) 250 - 450 ug/dL   Saturation Ratios 4 (L) 17.9 - 39.5 %   UIBC 515 ug/dL    Comment: Performed at [facility]  Ferritin     Status: Abnormal   Collection Time: 12/23/15  1:00 PM  Result Value Ref Range   Ferritin 12 (L) 24 - 336 ng/mL    Comment: Performed at [facility]  Reticulocytes     Status: Abnormal   Collection Time: 12/23/15  1:00 PM  Result Value Ref Range   Retic Ct Pct 2.3 0.4 - 3.1 %   RBC. 3.09 (L) 4.22 - 5.81 MIL/uL   Retic Count, Manual 71.1 19.0 - 186.0 K/uL  Prepare RBC     Status: None   Collection Time: 12/23/15  2:00 PM  Result Value Ref Range   Order Confirmation ORDER PROCESSED BY BLOOD BANK   CBC     Status: Abnormal   Collection Time: 12/23/15 11:10 PM  Result Value Ref Range   WBC 4.7 4.0 - 10.5 K/uL   RBC 3.42 (L) 4.22 - 5.81 MIL/uL   Hemoglobin 8.3 (L) 13.0 - 17.0 g/dL  HCT 27.0 (L) 39.0 - 52.0 %   MCV 78.9 78.0 - 100.0 fL   MCH 24.3 (L) 26.0 - 34.0 pg   MCHC 30.7 30.0 - 36.0 g/dL   RDW 15.4 11.5 - 15.5 %   Platelets 261 150 - 400 K/uL  Basic metabolic panel     Status: Abnormal   Collection Time: 12/24/15  5:02 AM  Result Value Ref Range   Sodium 141 135 - 145 mmol/L   Potassium 4.1 3.5 - 5.1 mmol/L   Chloride 110 101 - 111 mmol/L   CO2 23 22 - 32 mmol/L   Glucose, Bld 97 65 - 99 mg/dL   BUN 23 (H) 6 - 20 mg/dL   Creatinine, Ser 1.17 0.61 - 1.24 mg/dL   Calcium 9.1 8.9 - 10.3 mg/dL   GFR calc non Af Amer 58 (L) >60 mL/min   GFR calc Af Amer >60 >60 mL/min    Comment: (NOTE) The eGFR has been calculated using  the CKD EPI equation. This calculation has not been validated in all clinical situations. eGFR's persistently <60 mL/min signify possible Chronic Kidney Disease.    Anion gap 8 5 - 15  CBC     Status: Abnormal   Collection Time: 12/24/15  5:02 AM  Result Value Ref Range   WBC 5.3 4.0 - 10.5 K/uL   RBC 3.47 (L) 4.22 - 5.81 MIL/uL   Hemoglobin 8.4 (L) 13.0 - 17.0 g/dL   HCT 27.6 (L) 39.0 - 52.0 %   MCV 79.5 78.0 - 100.0 fL   MCH 24.2 (L) 26.0 - 34.0 pg   MCHC 30.4 30.0 - 36.0 g/dL   RDW 15.5 11.5 - 15.5 %   Platelets 281 150 - 400 K/uL    Dg Chest 2 View  12/23/2015  CLINICAL DATA:  Syncope and shortness of breath EXAM: CHEST  2 VIEW COMPARISON:  June 25, 2015 FINDINGS: The lungs are clear. The heart size and pulmonary vascularity are normal. No adenopathy. There is atherosclerotic calcification throughout the aorta. Patient is status post coronary artery bypass grafting. No bone lesions. IMPRESSION: No edema or consolidation. Aortic atherosclerosis. Patient is status post coronary artery bypass grafting. Electronically Signed   By: Lowella Grip III M.D.   On: 12/23/2015 16:16   Ct Head Wo Contrast  12/23/2015  CLINICAL DATA:  Syncopal episode. EXAM: CT HEAD WITHOUT CONTRAST TECHNIQUE: Contiguous axial images were obtained from the base of the skull through the vertex without intravenous contrast. COMPARISON:  06/07/2015 FINDINGS: The ventricles, cisterns and other CSF spaces are within normal. There is no mass, mass effect, shift of midline structures or acute hemorrhage. No evidence of acute infarction. Remaining bones and soft tissues are within normal. IMPRESSION: No acute intracranial findings. Electronically Signed   By: Marin Olp M.D.   On: 12/23/2015 16:06    ROS Blood pressure 124/47, pulse 58, temperature 97.8 F (36.6 C), temperature source Oral, resp. rate 20, height 5' 3.5" (1.613 m), weight 139 lb 11.2 oz (63.368 kg), SpO2 97 %. Physical Exam Alert and oriented.  Skin warm and dry. Oral mucosa is moist.   . Sclera anicteric, conjunctivae is pink. Thyroid not enlarged. No cervical lymphadenopathy. Lungs clear. Heart regular rate and rhythm.  Abdomen is soft. Bowel sounds are positive. No hepatomegaly. No abdominal masses felt. No tenderness.  No edema to lower extremities. Patient is alert and oriented.  Assessment/Plan: Melena. EGD in December for same was normal. Stool brown in the ED. No NSAIDs.  Will discuss with Dr Laural Golden.  SETZER,TERRI W 12/24/2015, 7:43 AM    GI attending note: Patient is 81 year old Caucasian male presents with stent to be anemic. He has history of melena and anemia dating back to fall of last year. He underwent EGD at [facility] in December 2016 and was within normal limits. Heartburn is well controlled with therapy. Patient is on Brilinta and aspirin cause of CAD stenting in November 2015. Patient had lower GI bleed secondary to ischemic colitis back in April 2014 when he underwent colonoscopy at [facility]. Abdominal exam is unremarkable except small umbilical hernia.  Given patient's presentation need to rule out small bowel GI bleed. Will proceed with small bowel given capsule study today. Study would be read early tomorrow morning less he passes capsule quickly. Patient will be given single dose of metoclopramide and will leave him on full liquids.

## 2015-12-24 NOTE — Consult Note (Signed)
Cardiology Consultation  Patient ID: POSEIDON SCHRAUTH; PU:[id number]; 1934/10/22   Admit date: 12/23/2015 Date of Consult: 12/24/2015  Referring MD:  Dr. Sloan Leiter   Cardiologist: Dr. Domenic Polite Consulting Cardiologist: Dr Domenic Polite  Patient Care Team: Curlene Labrum, MD as PCP - General Danie Binder, MD as Consulting Physician (Gastroenterology)   Reason for Consultation: GI bleed on Brilinta, syncope, bradycardia  History of Present Illness: Billy [name] is a 81 y.o. male with a hx of CAD S/P CABG, DES x 2 SVG to OM with staged DES SVG to RCA 06/2013, SVG to PDA 05/2015 in Twilight study on Brilinta, HTN, carotid disease 40-59%, sinus bradycardia, COPD.  Patient now presents with syncope and Hbg 7.2. He was sitting in a bar stool waiting for his wife to treat his ear that just had surgery. He became dizzy an passed out falling backwards. He was gray but came around quickly. He's had 1 month history of dizziness when bending over and working in his yard. Also 1 month of intermittent melena. Decreased appetite in am. No angina. Occasional twinge of left chest pain the comes and goes quickly at any time.   Past Medical History  Diagnosis Date  . Coronary atherosclerosis of native coronary artery     Multivessel status post CABG, DES x 2 SVG to OM with staged DES SVG to RCA November 2015, DES to SVG to PDA 05/2015  . Essential hypertension, benign   . Carotid artery disease ([place])     [id number] LICA and [id number] RICA - December 2013  . Sinus bradycardia     Asymptomatic  . Gout   . HOH (hard of hearing)     Past Surgical History  Procedure Laterality Date  . Coronary artery bypass graft  11/09/1999    LIMA to LAD, SVG to diagonal, SVG to OM1 and OM2, SVG to PDA  . Appendectomy      Ruptured  . Cataract extraction w/phaco Right 11/13/2012    Procedure: CATARACT EXTRACTION PHACO AND INTRAOCULAR LENS PLACEMENT (IOC);  Surgeon: Tonny Branch, MD;  Location: [facility];  Service: Ophthalmology;   Laterality: Right;  CDE:12.75  . Cataract extraction w/phaco Left 01/11/2013    Procedure: CATARACT EXTRACTION PHACO AND INTRAOCULAR LENS PLACEMENT (IOC);  Surgeon: Tonny Branch, MD;  Location: [facility];  Service: Ophthalmology;  Laterality: Left;  CDE: 21.13  . Left heart catheterization with coronary angiogram N/A 04/22/2014    Procedure: LEFT HEART CATHETERIZATION WITH CORONARY ANGIOGRAM;  Surgeon: Burnell Blanks, MD;  Location: [facility] CATH LAB;  Service: Cardiovascular;  Laterality: N/A;  . Percutaneous coronary stent intervention (pci-s) N/A 04/25/2014    Procedure: PERCUTANEOUS CORONARY STENT INTERVENTION (PCI-S);  Surgeon: Peter M Martinique, MD;  Location: [facility] CATH LAB;  Service: Cardiovascular;  Laterality: N/A;  . Colonoscopy  09/2013    Dr. Burke Keels: colitis descending colon and sigmoid colon.Bx ?Cdiff vs ischemic.  . Cardiac catheterization N/A 05/19/2015    Procedure: Left Heart Cath and Cors/Grafts Angiography;  Surgeon: Lorretta Harp, MD;  Location: [place] CV LAB;  Service: Cardiovascular;  Laterality: N/A;  . Cardiac catheterization N/A 05/19/2015    Procedure: Coronary Stent Intervention;  Surgeon: Lorretta Harp, MD;  Location: [place] CV LAB;  Service: Cardiovascular;  Laterality: N/A;  . Esophagogastroduodenoscopy N/A 05/20/2015    Procedure: ESOPHAGOGASTRODUODENOSCOPY (EGD);  Surgeon: Wilford Corner, MD;  Location: [facility] ENDOSCOPY;  Service: Endoscopy;  Laterality: N/A;     Home Meds: Prior to Admission medications  Medication Sig Start Date End Date Taking? Authorizing Provider  albuterol (PROVENTIL HFA;VENTOLIN HFA) 108 (90 Base) MCG/ACT inhaler Inhale 1-2 puffs into the lungs every 6 (six) hours as needed for wheezing or shortness of breath.   Yes Historical Provider, MD  AMBULATORY NON FORMULARY MEDICATION Take 90 mg by mouth 2 (two) times daily. Medication Name: Brilinta 90 mg BID (TWILIGHT Research study provided do not fill) 05/20/15  Yes Burnell Blanks, MD  AMBULATORY NON FORMULARY MEDICATION Take 81 mg by mouth daily. Medication Name: ASA 81 mg Daily or PLACEBO (TWILIGHT Research study provided) 08/13/15  Yes Burnell Blanks, MD  atorvastatin (LIPITOR) 10 MG tablet Take 1 tablet (10 mg total) by mouth daily. 11/27/15  Yes Satira Sark, MD  COLCRYS 0.6 MG tablet Take 1 tablet by mouth Once daily as needed (for gout).  03/05/12  Yes Historical Provider, MD  cyclobenzaprine (FLEXERIL) 10 MG tablet Take 10 mg by mouth at bedtime.   Yes Historical Provider, MD  esomeprazole (NEXIUM) 40 MG capsule Take 40 mg by mouth daily as needed.   Yes Historical Provider, MD  fluticasone (FLONASE) 50 MCG/ACT nasal spray Place 1-2 sprays into both nostrils daily as needed. 04/04/15  Yes Historical Provider, MD  fluticasone (FLOVENT HFA) 44 MCG/ACT inhaler Inhale 2 puffs into the lungs 2 (two) times daily.   Yes Historical Provider, MD  isosorbide mononitrate (IMDUR) 60 MG 24 hr tablet Take 0.5 tablets (30 mg total) by mouth daily. 08/19/15  Yes Satira Sark, MD  loratadine (CLARITIN) 10 MG tablet Take 10 mg by mouth at bedtime.   Yes Historical Provider, MD  metoprolol tartrate (LOPRESSOR) 25 MG tablet Take 1 tablet (25 mg total) by mouth 2 (two) times daily. 09/30/15  Yes Satira Sark, MD  nitroGLYCERIN (NITROSTAT) 0.4 MG SL tablet Place 1 tablet (0.4 mg total) under the tongue every 5 (five) minutes x 3 doses as needed. If no relief after 3rd dose, proceed to the ED for an evalutation 06/10/15  Yes Satira Sark, MD  tamsulosin (FLOMAX) 0.4 MG CAPS capsule Take 0.4 mg by mouth at bedtime.   Yes Historical Provider, MD    Current Medications: . atorvastatin  10 mg Oral Daily  . colchicine  0.6 mg Oral Daily  . cyclobenzaprine  10 mg Oral QHS  . isosorbide mononitrate  30 mg Oral Daily  . loratadine  10 mg Oral QHS  . metoprolol tartrate  12.5 mg Oral BID  . pantoprazole  40 mg Oral Daily  . sodium chloride flush  3 mL Intravenous  Q12H  . sodium chloride flush  3 mL Intravenous Q12H  . tamsulosin  0.4 mg Oral QHS  . ticagrelor  90 mg Oral BID     Allergies:    Allergies  Allergen Reactions  . Protonix [Pantoprazole Sodium] Itching and Rash    Social History:   The patient  reports that he quit smoking about 16 years ago. His smoking use included Cigarettes. He started smoking about 69 years ago. He has a 25 pack-year smoking history. He has never used smokeless tobacco. He reports that he does not drink alcohol or use illicit drugs.    Family History:   The patient's family history includes Cancer in his father.   ROS:  Please see the history of present illness.  Review of Systems  Constitution: Positive for decreased appetite and weakness.  HENT: Negative.   Cardiovascular: Negative.   Respiratory: Negative.   Endocrine:  Negative.   Hematologic/Lymphatic: Positive for bleeding problem.  Musculoskeletal: Positive for arthritis, back pain and neck pain.  Gastrointestinal: Positive for melena.  Genitourinary: Negative.   Neurological: Positive for dizziness and light-headedness.  All other ROS reviewed and negative.      Vital Signs: Blood pressure 124/47, pulse 60, temperature 97.8 F (36.6 C), temperature source Oral, resp. rate 20, height 5' 3.5" (1.613 m), weight 139 lb 11.2 oz (63.368 kg), SpO2 97 %.   PHYSICAL EXAM: General:  Well nourished, well developed, in no acute distress  HEENT: normal Lymph: no adenopathy Neck: no JVD Endocrine:  No thryomegaly Vascular: bilateral carotid bruits; FA pulses 2+ bilaterally without bruits Cardiac:  RRR; normal S1, S2; S4, 2/6 systolic murmur LSB, no rub, bruit, thrill, or heave Lungs:  clear to auscultation bilaterally, no wheezing, rhonchi or rales Abd: soft, nontender, no hepatomegaly Ext: no edema, Good distal pulses bilaterally Musculoskeletal:  No deformities, BUE and BLE strength normal and equal Skin: warm and dry Neuro:  CNs 2-12 intact, no  focal abnormalities noted Psych:  Normal affect   EKG:  Sinus bradycardia with PVC's  Telemetry:  NSR with some sinus bradycardia 47 bpm  Labs: No results for input(s): CKTOTAL, CKMB, TROPONINI in the last 72 hours. Lab Results  Component Value Date   WBC 5.3 12/24/2015   HGB 8.4* 12/24/2015   HCT 27.6* 12/24/2015   MCV 79.5 12/24/2015   PLT 281 12/24/2015    Recent Labs Lab 12/23/15 1141 12/24/15 0502  NA 135 141  K 4.1 4.1  CL 108 110  CO2 25 23  BUN 26* 23*  CREATININE 1.35* 1.17  CALCIUM 8.5* 9.1  PROT 6.8  --   BILITOT 0.6  --   ALKPHOS 50  --   ALT 23  --   AST 25  --   GLUCOSE 155* 97   Lab Results  Component Value Date   CHOL 134 04/23/2014   HDL 33* 04/23/2014   LDLCALC 81 04/23/2014   TRIG 99 04/23/2014   No results found for: DDIMER  Radiology/Studies:  Dg Chest 2 View  12/23/2015  CLINICAL DATA:  Syncope and shortness of breath EXAM: CHEST  2 VIEW COMPARISON:  June 25, 2015 FINDINGS: The lungs are clear. The heart size and pulmonary vascularity are normal. No adenopathy. There is atherosclerotic calcification throughout the aorta. Patient is status post coronary artery bypass grafting. No bone lesions. IMPRESSION: No edema or consolidation. Aortic atherosclerosis. Patient is status post coronary artery bypass grafting. Electronically Signed   By: Lowella Grip III M.D.   On: 12/23/2015 16:16   Ct Head Wo Contrast  12/23/2015  CLINICAL DATA:  Syncopal episode. EXAM: CT HEAD WITHOUT CONTRAST TECHNIQUE: Contiguous axial images were obtained from the base of the skull through the vertex without intravenous contrast. COMPARISON:  06/07/2015 FINDINGS: The ventricles, cisterns and other CSF spaces are within normal. There is no mass, mass effect, shift of midline structures or acute hemorrhage. No evidence of acute infarction. Remaining bones and soft tissues are within normal. IMPRESSION: No acute intracranial findings. Electronically Signed   By: Marin Olp M.D.   On: 12/23/2015 16:06    PROBLEM LIST:  Principal Problem:   Syncope Active Problems:   Melena   Symptomatic anemia    ASSESSMENT AND PLAN:  Syncope secondary to a GI bleed on Brilinta - will need to stop for now. Had DES in 05/2015 and in Twilight study. Could try on Plavix eventually if GI  agrees.  GI Bleed on Brilinta.  CAD S/P CABG, DES x 2 SVG to OM with staged DES SVG to RCA 06/2013, SVG to PDA 05/2015 in [place] study on Brilinta.  Sinus bradycardia stable. Beta blocker dose but back. No pauses by telemetry.  HTN  COPD  Signed, Ermalinda Barrios PA-C  12/24/2015 10:36 AM   Attending note:  Patient seen and examined. Reviewed hospital course and discussed with Ms. Bonnell Public PA-C. Mr. Betker presents after an episode of syncope. He has been having some orthostatic dizziness as well as intermittent melena over the last month. At presentation he was found to be both bradycardic and also significantly anemic with hemoglobin of 7.2. He reports a prior history of GI bleed back in 2015. From a cardiac perspective he has been on aspirin and Brilinta with multivessel disease status post CABG and DES intervention to the SVG to PDA in December 2016. He does not report any recent chest pain. On examination this morning he states that he feels fine, seated in bed side chair. Wife states that his color looks better. By telemetry he has had sinus rhythm and sinus bradycardia, no pauses however. He did have orthostatic blood pressure change from 155-123, although no significant increase in heart rate. Lungs are clear without labored breathing, cardiac exam with RRR and no gallop. Lab work shows creatinine 1.1, potassium 4.1, magnesium 2.1, iron 20 with saturation ratio 4 and ferritin 12, hemoglobin 8.4. ECG shows sinus rhythm with poor R wave progression, rule out old anterior infarct pattern, PVC, nonspecific ST changes. Patient presents with iron deficiency anemia and suspected GI bleed.  GI consultation pending with plan for further testing. Would recommend holding Brilinta at this time. He is over 6 months out from his DES intervention. We could consider trying to add back Plavix eventually depending on findings of his GI workup. Bradycardia is not clearly symptomatic, beta blocker dose was already reduced. Will continue to follow telemetry.  Satira Sark, M.D., F.A.C.C.

## 2015-12-24 NOTE — Progress Notes (Signed)
Olean Ree RN advised that patient has swallowed Givens Capsule. Instructions at bedside.  Patient and wife verbalized understanding of instructions.

## 2015-12-24 NOTE — Progress Notes (Signed)
PROGRESS NOTE        PATIENT DETAILS Name: Billy [name] Age: 81 y.o. Sex: male Date of Birth: 12/10/1934 Admit Date: 12/23/2015 Admitting Physician Estela Leonie Green, MD [id number] E, MD  Brief Narrative: Patient is a 81 y.o. male prior history of CAD status post CABG-and PCI SVG -PDA December 2016-on Brilinta-presented with a syncopal episode. Found to be anemic-further history positive for intermittent but ongoing melena for the past 1 month. Admitted for further evaluation and treatment  Subjective: Denies any chest pain or shortness of breath. Does acknowledge ongoing but intermittent melena for the past 1 month.  Assessment/Plan: Principal Problem: Syncope: Suspect this is due to a combination of bradycardia and ongoing slow GI bleed with acute blood loss anemia. EGD M.D., spoke with cardiology on-call, recommendations were to decrease metoprolol to 12.5 mg twice a day. Telemetry-negative overnight, most recent echocardiogram on 10/02/15 showed preserved ejection fraction of around 55%. Given the fact that he has ongoing slow GI bleeding-and is on Brilinta-with recent PCI in December 2016-he will need cardiology evaluation to see if we need to change him to an alternative antiplatelet agent.  Acute blood loss anemia: Ongoing melena for the past 1 month, iron panel consistent with iron deficiency-he is on antiplatelet agents for recent PCI-I suspect he will require endoscopic procedures-GI has been consulted, see above regarding cardiology consultation. He has been transfused 1 unit of PRBC-with appropriate rise in his hemoglobin-we will continue to follow his clinical course and CBC closely. Will start prophylactic Lovenox, continue PPI, await GI and cardiology evaluation.  History of CAD status post CABG with subsequent PCI to graft-most recently DES to SVG-PDA dEC 2016: For now cautiously continue with Brilinta, statin and beta blocker. Given  ongoing concern for GI bleeding and worsening anemia-I have consulted cardiology to see if antiplatelet agents need to be changed.  Bradycardia: Evident on admission-much better after decreasing dose of metoprolol. Check TSH.  History of gout: Currently stable without any flare, continue colcichine  Dyslipidemia: Continue statin  Hypertension: Controlled with metoprolol.  DVT Prophylaxis:  SCD's  Code Status: Full code   Family Communication: None at bedside  Disposition Plan: Remain inpatient-suspect requires a few more days of hospitalization prior to discharge.  Antimicrobial agents: None  Procedures: None  CONSULTS:  cardiology and GI  Time spent: 25- minutes-Greater than 50% of this time was spent in counseling, explanation of diagnosis, planning of further management, and coordination of care.  MEDICATIONS: Anti-infectives    None      Scheduled Meds: . atorvastatin  10 mg Oral Daily  . colchicine  0.6 mg Oral Daily  . cyclobenzaprine  10 mg Oral QHS  . enoxaparin (LOVENOX) injection  40 mg Subcutaneous Q24H  . isosorbide mononitrate  30 mg Oral Daily  . loratadine  10 mg Oral QHS  . metoprolol tartrate  12.5 mg Oral BID  . pantoprazole  40 mg Oral Daily  . sodium chloride flush  3 mL Intravenous Q12H  . sodium chloride flush  3 mL Intravenous Q12H  . tamsulosin  0.4 mg Oral QHS  . ticagrelor  90 mg Oral BID   Continuous Infusions:  PRN Meds:.sodium chloride, acetaminophen **OR** acetaminophen, albuterol, ondansetron **OR** ondansetron (ZOFRAN) IV, senna-docusate, sodium chloride flush   PHYSICAL EXAM: Vital signs: Filed Vitals:   12/23/15 1707 12/23/15 1739 12/23/15 1945 12/24/15  0542  BP: 146/48 144/66 138/67 124/47  Pulse: 64 60 65 58  Temp: 98.4 F (36.9 C) 97.9 F (36.6 C) 98 F (36.7 C) 97.8 F (36.6 C)  TempSrc: Oral Oral Oral Oral  Resp: 18 18 18 20   Height:      Weight:    63.368 kg (139 lb 11.2 oz)  SpO2: 99% 100% 99% 97%    Filed Weights   12/23/15 1130 12/24/15 0542  Weight: 63.504 kg (140 lb) 63.368 kg (139 lb 11.2 oz)   Body mass index is 24.36 kg/(m^2).   Gen Exam: Awake and alert with clear speech. Not in any distress Neck: Supple, No JVD.   Chest: B/L Clear.   CVS: S1 S2 Regular, no murmurs.  Abdomen: soft, BS +, non tender, non distended.  Extremities: no edema, lower extremities warm to touch. Neurologic: Non Focal.   Skin: No Rash or lesions   Wounds: N/A.  LABORATORY DATA: CBC:  Recent Labs Lab 12/23/15 1141 12/23/15 2310 12/24/15 0502  WBC 5.3 4.7 5.3  HGB 7.2* 8.3* 8.4*  HCT 24.3* 27.0* 27.6*  MCV 78.1 78.9 79.5  PLT 279 261 [id number]    Basic Metabolic Panel:  Recent Labs Lab 12/23/15 1141 12/24/15 0502  NA 135 141  K 4.1 4.1  CL 108 110  CO2 25 23  GLUCOSE 155* 97  BUN 26* 23*  CREATININE 1.35* 1.17  CALCIUM 8.5* 9.1  MG 2.1  --     GFR: Estimated Creatinine Clearance: 42.8 mL/min (by C-G formula based on Cr of 1.17).  Liver Function Tests:  Recent Labs Lab 12/23/15 1141  AST 25  ALT 23  ALKPHOS 50  BILITOT 0.6  PROT 6.8  ALBUMIN 3.8   No results for input(s): LIPASE, AMYLASE in the last 168 hours. No results for input(s): AMMONIA in the last 168 hours.  Coagulation Profile: No results for input(s): INR, PROTIME in the last 168 hours.  Cardiac Enzymes: No results for input(s): CKTOTAL, CKMB, CKMBINDEX, TROPONINI in the last 168 hours.  BNP (last 3 results) No results for input(s): PROBNP in the last 8760 hours.  HbA1C: No results for input(s): HGBA1C in the last 72 hours.  CBG:  Recent Labs Lab 12/23/15 1207  GLUCAP 167*    Lipid Profile: No results for input(s): CHOL, HDL, LDLCALC, TRIG, CHOLHDL, LDLDIRECT in the last 72 hours.  Thyroid Function Tests: No results for input(s): TSH, T4TOTAL, FREET4, T3FREE, THYROIDAB in the last 72 hours.  Anemia Panel:  Recent Labs  12/23/15 1300  VITAMINB12 299  FOLATE 21.7  FERRITIN 12*   TIBC 535*  IRON 20*  RETICCTPCT 2.3    Urine analysis:    Component Value Date/Time   COLORURINE STRAW* 12/23/2015 1230   APPEARANCEUR CLEAR 12/23/2015 1230   LABSPEC 1.020 12/23/2015 1230   PHURINE 6.0 12/23/2015 1230   GLUCOSEU NEGATIVE 12/23/2015 1230   HGBUR NEGATIVE 12/23/2015 1230   BILIRUBINUR NEGATIVE 12/23/2015 1230   KETONESUR NEGATIVE 12/23/2015 1230   PROTEINUR NEGATIVE 12/23/2015 1230   NITRITE NEGATIVE 12/23/2015 1230   LEUKOCYTESUR NEGATIVE 12/23/2015 1230    Sepsis Labs: Lactic Acid, Venous No results found for: LATICACIDVEN  MICROBIOLOGY: No results found for this or any previous visit (from the past 240 hour(s)).  RADIOLOGY STUDIES/RESULTS: Dg Chest 2 View  12/23/2015  CLINICAL DATA:  Syncope and shortness of breath EXAM: CHEST  2 VIEW COMPARISON:  June 25, 2015 FINDINGS: The lungs are clear. The heart size and pulmonary vascularity are normal.  No adenopathy. There is atherosclerotic calcification throughout the aorta. Patient is status post coronary artery bypass grafting. No bone lesions. IMPRESSION: No edema or consolidation. Aortic atherosclerosis. Patient is status post coronary artery bypass grafting. Electronically Signed   By: Lowella Grip III M.D.   On: 12/23/2015 16:16   Ct Head Wo Contrast  12/23/2015  CLINICAL DATA:  Syncopal episode. EXAM: CT HEAD WITHOUT CONTRAST TECHNIQUE: Contiguous axial images were obtained from the base of the skull through the vertex without intravenous contrast. COMPARISON:  06/07/2015 FINDINGS: The ventricles, cisterns and other CSF spaces are within normal. There is no mass, mass effect, shift of midline structures or acute hemorrhage. No evidence of acute infarction. Remaining bones and soft tissues are within normal. IMPRESSION: No acute intracranial findings. Electronically Signed   By: Marin Olp M.D.   On: 12/23/2015 16:06       Oren Binet, MD  [facility] Pager:[phone]  If 7PM-7AM,  please contact night-coverage [email or web address] Password [email or web address] 12/24/2015, 8:01 AM

## 2015-12-25 DIAGNOSIS — D62 Acute posthemorrhagic anemia: Secondary | ICD-10-CM | POA: Diagnosis not present

## 2015-12-25 DIAGNOSIS — R55 Syncope and collapse: Secondary | ICD-10-CM | POA: Diagnosis not present

## 2015-12-25 DIAGNOSIS — R001 Bradycardia, unspecified: Secondary | ICD-10-CM | POA: Diagnosis not present

## 2015-12-25 DIAGNOSIS — I10 Essential (primary) hypertension: Secondary | ICD-10-CM

## 2015-12-25 DIAGNOSIS — D509 Iron deficiency anemia, unspecified: Secondary | ICD-10-CM | POA: Insufficient documentation

## 2015-12-25 DIAGNOSIS — I25810 Atherosclerosis of coronary artery bypass graft(s) without angina pectoris: Secondary | ICD-10-CM | POA: Diagnosis not present

## 2015-12-25 DIAGNOSIS — D500 Iron deficiency anemia secondary to blood loss (chronic): Secondary | ICD-10-CM | POA: Diagnosis not present

## 2015-12-25 DIAGNOSIS — K921 Melena: Secondary | ICD-10-CM | POA: Diagnosis not present

## 2015-12-25 DIAGNOSIS — D649 Anemia, unspecified: Secondary | ICD-10-CM | POA: Diagnosis not present

## 2015-12-25 LAB — BASIC METABOLIC PANEL
Anion gap: 9 (ref 5–15)
BUN: 28 mg/dL — AB (ref 6–20)
CALCIUM: 8.8 mg/dL — AB (ref 8.9–10.3)
CHLORIDE: 108 mmol/L (ref 101–111)
CO2: 24 mmol/L (ref 22–32)
CREATININE: 1.2 mg/dL (ref 0.61–1.24)
GFR, EST NON AFRICAN AMERICAN: 56 mL/min — AB (ref >60)
Glucose, Bld: 100 mg/dL — ABNORMAL HIGH (ref 65–99)
Potassium: 4.1 mmol/L (ref 3.5–5.1)
SODIUM: 141 mmol/L (ref 135–145)

## 2015-12-25 LAB — CBC
HCT: 27.2 % — ABNORMAL LOW (ref 39.0–52.0)
HEMOGLOBIN: 8.3 g/dL — AB (ref 13.0–17.0)
MCH: 24.2 pg — AB (ref 26.0–34.0)
MCHC: 30.5 g/dL (ref 30.0–36.0)
MCV: 79.3 fL (ref 78.0–100.0)
PLATELETS: 284 10*3/uL (ref 150–400)
RBC: 3.43 MIL/uL — ABNORMAL LOW (ref 4.22–5.81)
RDW: 15.8 % — AB (ref 11.5–15.5)
WBC: 4.9 10*3/uL (ref 4.0–10.5)

## 2015-12-25 LAB — GLUCOSE, CAPILLARY
GLUCOSE-CAPILLARY: 102 mg/dL — AB (ref 65–99)
Glucose-Capillary: 114 mg/dL — ABNORMAL HIGH (ref 65–99)

## 2015-12-25 MED ORDER — SODIUM CHLORIDE 0.9 % IV SOLN
510.0000 mg | INTRAVENOUS | Status: DC
  Administered 2015-12-25: 510 mg via INTRAVENOUS
  Filled 2015-12-25: qty 17

## 2015-12-25 MED ORDER — SODIUM CHLORIDE 0.9 % IV SOLN
INTRAVENOUS | Status: DC
  Administered 2015-12-26: 11:00:00 via INTRAVENOUS

## 2015-12-25 NOTE — Op Note (Signed)
Small Bowel Givens Capsule Study Procedure date:  12/25/15   Ordering provider: Dr. Hildred Laser on call for [facility] PCP:  Dr. Curlene Labrum, MD  Indication for procedure:  Obscure GI bleeding, melena, transfusion dependent anemia. EGD by Dr. Michail Sermon in 05/2015 was unremarkable although small portion of proximal stomach not visualized due to retained food. Last colonoscopy by Dr. Anthony Sar in 2014, descending and sigmoid colitis, bx most c/w with ischemia vs Cdiff.    Patient data:  Wt: 139 pounds Ht: 67 inches   Findings:  Complete small bowel capsule endoscopy. Capsule reached the cecum prior to the end of the study. Distal esophageal mucosal abnormality seen, likely erosion. Several areas of small bowel mucosal abnormalities seen both proximally and distally. Majority appeared to be proximal small bowel noted at 1 hour 40 minutes, 2 hours 7 minutes, 2 hours 10 minutes. Additional abnormalities noted at 4 hours 53 minutes, 6 hours 58 minutes, 7 hours 25 minutes and 23 seconds.  First Gastric image:  8 minutes 46 seconds First Duodenal image: 24 minutes 30 seconds First Ileo-Cecal Valve image: 8 hours 27 minutes 35 seconds First Cecal image: 8 hours 27 minutes 41 seconds Gastric Passage time: 15 minutes  Small Bowel Passage time:  8 hours 3 minutes   Summary & Recommendations: No active bleeding noted on study but there were several small bowel mucosal abnormalities noted both proximally and a couple distally. Patient had EGD back in December 2016 for melena, proximal stomach not completely seen due to retained food. Discussed with Dr. Gala Romney. Based on history of melena, IDA, current small bowel capsule endoscopy findings, would offer patient an EGD with enteroscopy tomorrow. Dr. Gala Romney discussed with patient who is agreeable.  Laureen Ochs. Bernarda Caffey [facility] [phone] 7/20/20173:33 PM

## 2015-12-25 NOTE — Progress Notes (Signed)
Primary cardiologist: Dr. Satira Sark  Seen for followup: Bradycardia  Subjective:    No dizziness, syncope, or chest pain. Appetite stable. Has not noticed any sudden changes in his stool.  Objective:   Temp:  [97.6 F (36.4 C)-99 F (37.2 C)] 97.7 F (36.5 C) (07/20 0848) Pulse Rate:  [65-81] 67 (07/20 0848) Resp:  [18-20] 19 (07/20 0848) BP: (111-167)/(48-82) 136/58 mmHg (07/20 0848) SpO2:  [95 %-100 %] 100 % (07/20 0848) Weight:  [139 lb 9.6 oz (63.322 kg)] 139 lb 9.6 oz (63.322 kg) (07/20 0500) Last BM Date: 12/22/15  Filed Weights   12/23/15 1130 12/24/15 0542 12/25/15 0500  Weight: 140 lb (63.504 kg) 139 lb 11.2 oz (63.368 kg) 139 lb 9.6 oz (63.322 kg)    Intake/Output Summary (Last 24 hours) at 12/25/15 1135 Last data filed at 12/24/15 2100  Gross per 24 hour  Intake      0 ml  Output    200 ml  Net   -200 ml    Telemetry: Sinus rhythm, no pauses.  Exam:  General: Elderly male, appears comfortable.  Lungs: Clear, nonlabored.  Cardiac: RRR without gallop.  Abdomen: NABS.  Extremities: No pitting edema.  Lab Results:  Basic Metabolic Panel:  Recent Labs Lab 12/23/15 1141 12/24/15 0502 12/25/15 0510  NA 135 141 141  K 4.1 4.1 4.1  CL 108 110 108  CO2 25 23 24   GLUCOSE 155* 97 100*  BUN 26* 23* 28*  CREATININE 1.35* 1.17 1.20  CALCIUM 8.5* 9.1 8.8*  MG 2.1  --   --     Liver Function Tests:  Recent Labs Lab 12/23/15 1141  AST 25  ALT 23  ALKPHOS 50  BILITOT 0.6  PROT 6.8  ALBUMIN 3.8    CBC:  Recent Labs Lab 12/23/15 2310 12/24/15 0502 12/25/15 0510  WBC 4.7 5.3 4.9  HGB 8.3* 8.4* 8.3*  HCT 27.0* 27.6* 27.2*  MCV 78.9 79.5 79.3  PLT 261 281 284     Medications:   Scheduled Medications: . atorvastatin  10 mg Oral Daily  . colchicine  0.6 mg Oral Daily  . cyclobenzaprine  10 mg Oral QHS  . ferumoxytol  510 mg Intravenous Weekly  . isosorbide mononitrate  30 mg Oral Daily  . loratadine  10 mg Oral QHS    . metoprolol tartrate  12.5 mg Oral BID  . pantoprazole  40 mg Oral Daily  . sodium chloride flush  3 mL Intravenous Q12H  . sodium chloride flush  3 mL Intravenous Q12H  . tamsulosin  0.4 mg Oral QHS     PRN Medications:  sodium chloride, acetaminophen **OR** acetaminophen, albuterol, ondansetron **OR** ondansetron (ZOFRAN) IV, senna-docusate, sodium chloride flush   Assessment:   1. Sinus bradycardia, not clearly symptomatic. Lopressor dose was cut back and his heart rate generally looks better. He has had no pauses to explain an arrhythmogenic cause of syncope or near syncope.  2. Multivessel CAD status post CABG with subsequent interventions, most recently DES to the SVG to PDA in December 2016. He has been taken off of aspirin and Brilinta in the setting of GI bleed with severe anemia. No angina symptoms.  3. Severe anemia with GI bleed. Capsule study underway to investigate small bowel source. Patient being evaluated by Dr. Laural Golden. Question will be whether he has an etiology of bleeding identified that may well recur. If this is the case, we may put him back on aspirin alone since he  is greater than 6 months out from his DES intervention. Otherwise, we might consider trying Plavix instead of Brilinta.  Plan/Discussion:    No change in current regimen. Stay off aspirin and Brilinta for now pending follow-up of GI workup.   Satira Sark, M.D., F.A.C.C.

## 2015-12-25 NOTE — Progress Notes (Signed)
PROGRESS NOTE        PATIENT DETAILS Name: Billy [name] Age: 81 y.o. Sex: male Date of Birth: 26-Mar-1935 Admit Date: 12/23/2015 Admitting Physician Estela Leonie Green, MD ZJ:[id number] E, MD  Brief Narrative: Patient is a 81 y.o. male prior history of CAD status post CABG-and PCI SVG -PDA December 2016-on Brilinta-presented with a syncopal episode. Found to be anemic-further history positive for intermittent but ongoing melena for the past 1 month. Admitted transfused 1 unit of PRBC, cardiology consulted and Brilinta placed on hold, gastroenterology also consulted and underwent a capsule endoscopy- results of which are currently pending. Hemoglobin stable since transfusion. for further evaluation and treatment  Subjective: No melena since admission. In fact hasn't had a bowel movement movement since yesterday.   Assessment/Plan: Principal Problem: Syncope: Suspect this is due to a combination of bradycardia and ongoing slow GI bleed with acute blood loss anemia. Telemetry continues to be negative for major arrhythmias,most recent echocardiogram on 10/02/15 showed preserved ejection fraction of around 55%. Metoprolol decreased to 12.5 mg twice a day-cardiology following-suspect do not need any further investigations at this time.   Acute blood loss anemia: Ongoing melena for the past 1 month, iron panel consistent with iron deficiency-he is on Brilinta for recent PCI (December 2016).Brilinta is currently on hold, GI capsule endoscopy was performed yesterday-we are awaiting official report. Patient was transfused 1 unit of PRBC on admission, hemoglobin stable since then. We'll ask pharmacy to dose IV iron. Follow.  Suspected upper GI bleeding: Ongoing since fall of last year-EGD in December 2016 was without major abnormalities. Gastroenterology suspecting a small bowel bleed-awaiting results of capsule endoscopy. No further melena since admission. Await  further recommendations from GI.  History of CAD status post CABG with subsequent PCI to graft-most recently DES to SVG-PDA dEC 2016: Given anemia, ongoing GI bleeding for the past one year-cardiology was consulted-Brilinta is currently on hold. He is currently stable without any chest pain. I suspect he will need to be changed to Plavix on discharge, will await cardiology recommendations.   Bradycardia: Evident on admission-much better after decreasing dose of metoprolol. TSH within normal limits  History of gout: Currently stable without any flare, continue colcichine  Dyslipidemia: Continue statin  Hypertension: Controlled with metoprolol.  DVT Prophylaxis:  SCD's  Code Status: Full code   Family Communication: None at bedside-spoke with spouse on 7/19.  Disposition Plan: Remain inpatient-await GI workup to determine timing of disposition.   Antimicrobial agents: None  Procedures: None  CONSULTS:  cardiology and GI  Time spent: 25- minutes-Greater than 50% of this time was spent in counseling, explanation of diagnosis, planning of further management, and coordination of care.  MEDICATIONS: Anti-infectives    None      Scheduled Meds: . atorvastatin  10 mg Oral Daily  . colchicine  0.6 mg Oral Daily  . cyclobenzaprine  10 mg Oral QHS  . isosorbide mononitrate  30 mg Oral Daily  . loratadine  10 mg Oral QHS  . metoprolol tartrate  12.5 mg Oral BID  . pantoprazole  40 mg Oral Daily  . sodium chloride flush  3 mL Intravenous Q12H  . sodium chloride flush  3 mL Intravenous Q12H  . tamsulosin  0.4 mg Oral QHS   Continuous Infusions:  PRN Meds:.sodium chloride, acetaminophen **OR** acetaminophen, albuterol, ondansetron **OR** ondansetron (ZOFRAN) IV, senna-docusate,  sodium chloride flush   PHYSICAL EXAM: Vital signs: Filed Vitals:   12/24/15 2000 12/25/15 0000 12/25/15 0500 12/25/15 0848  BP: 130/48 167/82 129/61 136/58  Pulse: 79 81 66 67  Temp: 98.9 F  (37.2 C) 99 F (37.2 C) 98.1 F (36.7 C) 97.7 F (36.5 C)  TempSrc: Oral Oral Oral Oral  Resp: 18 18 20 19   Height:      Weight:   63.322 kg (139 lb 9.6 oz)   SpO2: 96% 97% 99% 100%   Filed Weights   12/23/15 1130 12/24/15 0542 12/25/15 0500  Weight: 63.504 kg (140 lb) 63.368 kg (139 lb 11.2 oz) 63.322 kg (139 lb 9.6 oz)   Body mass index is 21.86 kg/(m^2).   Gen Exam: Awake and alert with clear speech. Not in any distress Neck: Supple, No JVD.   Chest: B/L Clear.   CVS: S1 S2 Regular, no murmurs.  Abdomen: soft, BS +, non tender, non distended.  Extremities: no edema, lower extremities warm to touch. Neurologic: Non Focal.   Skin: No Rash or lesions   Wounds: N/A.  LABORATORY DATA: CBC:  Recent Labs Lab 12/23/15 1141 12/23/15 2310 12/24/15 0502 12/25/15 0510  WBC 5.3 4.7 5.3 4.9  HGB 7.2* 8.3* 8.4* 8.3*  HCT 24.3* 27.0* 27.6* 27.2*  MCV 78.1 78.9 79.5 79.3  PLT 279 261 281 [id number]    Basic Metabolic Panel:  Recent Labs Lab 12/23/15 1141 12/24/15 0502 12/25/15 0510  NA 135 141 141  K 4.1 4.1 4.1  CL 108 110 108  CO2 25 23 24   GLUCOSE 155* 97 100*  BUN 26* 23* 28*  CREATININE 1.35* 1.17 1.20  CALCIUM 8.5* 9.1 8.8*  MG 2.1  --   --     GFR: Estimated Creatinine Clearance: 45.4 mL/min (by C-G formula based on Cr of 1.2).  Liver Function Tests:  Recent Labs Lab 12/23/15 1141  AST 25  ALT 23  ALKPHOS 50  BILITOT 0.6  PROT 6.8  ALBUMIN 3.8   No results for input(s): LIPASE, AMYLASE in the last 168 hours. No results for input(s): AMMONIA in the last 168 hours.  Coagulation Profile: No results for input(s): INR, PROTIME in the last 168 hours.  Cardiac Enzymes: No results for input(s): CKTOTAL, CKMB, CKMBINDEX, TROPONINI in the last 168 hours.  BNP (last 3 results) No results for input(s): PROBNP in the last 8760 hours.  HbA1C: No results for input(s): HGBA1C in the last 72 hours.  CBG:  Recent Labs Lab 12/23/15 1207 12/25/15 0729    GLUCAP 167* 102*    Lipid Profile: No results for input(s): CHOL, HDL, LDLCALC, TRIG, CHOLHDL, LDLDIRECT in the last 72 hours.  Thyroid Function Tests:  Recent Labs  12/24/15 0844  TSH 3.947    Anemia Panel:  Recent Labs  12/23/15 1300  VITAMINB12 299  FOLATE 21.7  FERRITIN 12*  TIBC 535*  IRON 20*  RETICCTPCT 2.3    Urine analysis:    Component Value Date/Time   COLORURINE STRAW* 12/23/2015 1230   APPEARANCEUR CLEAR 12/23/2015 1230   LABSPEC 1.020 12/23/2015 1230   PHURINE 6.0 12/23/2015 1230   GLUCOSEU NEGATIVE 12/23/2015 1230   HGBUR NEGATIVE 12/23/2015 1230   BILIRUBINUR NEGATIVE 12/23/2015 1230   KETONESUR NEGATIVE 12/23/2015 1230   PROTEINUR NEGATIVE 12/23/2015 1230   NITRITE NEGATIVE 12/23/2015 1230   LEUKOCYTESUR NEGATIVE 12/23/2015 1230    Sepsis Labs: Lactic Acid, Venous No results found for: LATICACIDVEN  MICROBIOLOGY: No results found for this or  any previous visit (from the past 240 hour(s)).  RADIOLOGY STUDIES/RESULTS: Dg Chest 2 View  12/23/2015  CLINICAL DATA:  Syncope and shortness of breath EXAM: CHEST  2 VIEW COMPARISON:  June 25, 2015 FINDINGS: The lungs are clear. The heart size and pulmonary vascularity are normal. No adenopathy. There is atherosclerotic calcification throughout the aorta. Patient is status post coronary artery bypass grafting. No bone lesions. IMPRESSION: No edema or consolidation. Aortic atherosclerosis. Patient is status post coronary artery bypass grafting. Electronically Signed   By: Lowella Grip III M.D.   On: 12/23/2015 16:16   Ct Head Wo Contrast  12/23/2015  CLINICAL DATA:  Syncopal episode. EXAM: CT HEAD WITHOUT CONTRAST TECHNIQUE: Contiguous axial images were obtained from the base of the skull through the vertex without intravenous contrast. COMPARISON:  06/07/2015 FINDINGS: The ventricles, cisterns and other CSF spaces are within normal. There is no mass, mass effect, shift of midline structures or  acute hemorrhage. No evidence of acute infarction. Remaining bones and soft tissues are within normal. IMPRESSION: No acute intracranial findings. Electronically Signed   By: Marin Olp M.D.   On: 12/23/2015 16:06       Oren Binet, MD  [facility] Pager:[phone]  If 7PM-7AM, please contact night-coverage [email or web address] Password [facility] 12/25/2015, 8:51 AM

## 2015-12-25 NOTE — Progress Notes (Signed)
ANTIBIOTIC CONSULT NOTE - INITIAL  Pharmacy Consult for Iron dosing Indication: iron deficient anemia  Allergies  Allergen Reactions  . Protonix [Pantoprazole Sodium] Itching and Rash    Patient Measurements: Height: 5\' 7"  (170.2 cm) Weight: 139 lb 9.6 oz (63.322 kg) IBW/kg (Calculated) : 66.1   Vital Signs: Temp: 97.7 F (36.5 C) (07/20 0848) Temp Source: Oral (07/20 0848) BP: 136/58 mmHg (07/20 0848) Pulse Rate: 67 (07/20 0848) Intake/Output from previous day: 07/19 0701 - 07/20 0700 In: -  Out: 200 [Urine:200] Intake/Output from this shift:    Labs:  Recent Labs  12/23/15 1141 12/23/15 2310 12/24/15 0502 12/25/15 0510  WBC 5.3 4.7 5.3 4.9  HGB 7.2* 8.3* 8.4* 8.3*  PLT 279 261 281 284  CREATININE 1.35*  --  1.17 1.20   Estimated Creatinine Clearance: 45.4 mL/min (by C-G formula based on Cr of 1.2). No results for input(s): VANCOTROUGH, VANCOPEAK, VANCORANDOM, GENTTROUGH, GENTPEAK, GENTRANDOM, TOBRATROUGH, TOBRAPEAK, TOBRARND, AMIKACINPEAK, AMIKACINTROU, AMIKACIN in the last 72 hours.   Microbiology: No results found for this or any previous visit (from the past 720 hour(s)).  Medical History: Past Medical History  Diagnosis Date  . Coronary atherosclerosis of native coronary artery     Multivessel status post CABG, DES x 2 SVG to OM with staged DES SVG to RCA November 2015, DES to SVG to PDA 05/2015  . Essential hypertension, benign   . Carotid artery disease ([place])     123456 LICA and [id number] RICA - December 2013  . Sinus bradycardia     Asymptomatic  . Gout   . HOH (hard of hearing)     Medications:  Prescriptions prior to admission  Medication Sig Dispense Refill Last Dose  . albuterol (PROVENTIL HFA;VENTOLIN HFA) 108 (90 Base) MCG/ACT inhaler Inhale 1-2 puffs into the lungs every 6 (six) hours as needed for wheezing or shortness of breath.   unknown  . AMBULATORY NON FORMULARY MEDICATION Take 90 mg by mouth 2 (two) times daily. Medication Name:  Brilinta 90 mg BID (TWILIGHT Research study provided do not fill)   12/23/2015 at Unknown time  . AMBULATORY NON FORMULARY MEDICATION Take 81 mg by mouth daily. Medication Name: ASA 81 mg Daily or PLACEBO (TWILIGHT Research study provided)   12/23/2015 at Unknown time  . atorvastatin (LIPITOR) 10 MG tablet Take 1 tablet (10 mg total) by mouth daily. 90 tablet 3 12/23/2015 at Unknown time  . COLCRYS 0.6 MG tablet Take 1 tablet by mouth Once daily as needed (for gout).    12/23/2015 at Unknown time  . cyclobenzaprine (FLEXERIL) 10 MG tablet Take 10 mg by mouth at bedtime.   12/22/2015 at Unknown time  . esomeprazole (NEXIUM) 40 MG capsule Take 40 mg by mouth daily as needed.   unknown  . fluticasone (FLONASE) 50 MCG/ACT nasal spray Place 1-2 sprays into both nostrils daily as needed.   unknown  . fluticasone (FLOVENT HFA) 44 MCG/ACT inhaler Inhale 2 puffs into the lungs 2 (two) times daily.   12/22/2015 at Unknown time  . isosorbide mononitrate (IMDUR) 60 MG 24 hr tablet Take 0.5 tablets (30 mg total) by mouth daily. 45 tablet 3 12/23/2015 at Unknown time  . loratadine (CLARITIN) 10 MG tablet Take 10 mg by mouth at bedtime.   unknown  . metoprolol tartrate (LOPRESSOR) 25 MG tablet Take 1 tablet (25 mg total) by mouth 2 (two) times daily. 180 tablet 3 12/23/2015 at 0900  . nitroGLYCERIN (NITROSTAT) 0.4 MG SL tablet Place 1 tablet (  0.4 mg total) under the tongue every 5 (five) minutes x 3 doses as needed. If no relief after 3rd dose, proceed to the ED for an evalutation 75 tablet 1 unknown  . tamsulosin (FLOMAX) 0.4 MG CAPS capsule Take 0.4 mg by mouth at bedtime.   12/22/2015 at Unknown time   Assessment: 81 yo male present to ED with syncope. Found to anemic. Patient has been positive for intermittent melena for past month. Brilinta has been placed on hold, patient transfused. Consulted for iron replacement. Iron deficit calculated at 1335mg  of elemental iron.  Goal of Therapy:  replacement of  iron Improvement in H/H   Plan:  Feraheme 510mg  today and repeat in 1 week  Isac Sarna, BS Pharm D, BCPS Clinical Pharmacist Pager [phone]  12/25/2015,9:02 AM

## 2015-12-26 ENCOUNTER — Encounter (HOSPITAL_COMMUNITY)
Admission: EM | Disposition: A | Discharge status: Home / Self Care | Payer: Self-pay | Source: Home / Self Care | Attending: Emergency Medicine

## 2015-12-26 ENCOUNTER — Encounter (HOSPITAL_COMMUNITY): Payer: Self-pay | Admitting: *Deleted

## 2015-12-26 DIAGNOSIS — I25810 Atherosclerosis of coronary artery bypass graft(s) without angina pectoris: Secondary | ICD-10-CM | POA: Diagnosis not present

## 2015-12-26 DIAGNOSIS — D649 Anemia, unspecified: Secondary | ICD-10-CM | POA: Diagnosis not present

## 2015-12-26 DIAGNOSIS — K319 Disease of stomach and duodenum, unspecified: Secondary | ICD-10-CM | POA: Insufficient documentation

## 2015-12-26 DIAGNOSIS — D500 Iron deficiency anemia secondary to blood loss (chronic): Secondary | ICD-10-CM | POA: Diagnosis not present

## 2015-12-26 DIAGNOSIS — K921 Melena: Secondary | ICD-10-CM | POA: Diagnosis not present

## 2015-12-26 DIAGNOSIS — R55 Syncope and collapse: Secondary | ICD-10-CM | POA: Diagnosis not present

## 2015-12-26 HISTORY — PX: ESOPHAGOGASTRODUODENOSCOPY: SHX5428

## 2015-12-26 HISTORY — PX: ENTEROSCOPY: SHX5533

## 2015-12-26 LAB — CBC
HEMATOCRIT: 28.7 % — AB (ref 39.0–52.0)
HEMOGLOBIN: 8.7 g/dL — AB (ref 13.0–17.0)
MCH: 24 pg — AB (ref 26.0–34.0)
MCHC: 30.3 g/dL (ref 30.0–36.0)
MCV: 79.3 fL (ref 78.0–100.0)
Platelets: 253 10*3/uL (ref 150–400)
RBC: 3.62 MIL/uL — AB (ref 4.22–5.81)
RDW: 15.9 % — ABNORMAL HIGH (ref 11.5–15.5)
WBC: 4 10*3/uL (ref 4.0–10.5)

## 2015-12-26 LAB — GLUCOSE, CAPILLARY: GLUCOSE-CAPILLARY: 85 mg/dL (ref 65–99)

## 2015-12-26 SURGERY — EGD (ESOPHAGOGASTRODUODENOSCOPY)
Anesthesia: Moderate Sedation

## 2015-12-26 MED ORDER — FERROUS SULFATE 325 (65 FE) MG PO TABS: 325.0000 mg | ORAL_TABLET | Freq: Two times a day (BID) | ORAL | Status: DC

## 2015-12-26 MED ORDER — MEPERIDINE HCL 100 MG/ML IJ SOLN
INTRAMUSCULAR | Status: AC
  Filled 2015-12-26: qty 2

## 2015-12-26 MED ORDER — ASPIRIN EC 81 MG PO TBEC: 81.0000 mg | DELAYED_RELEASE_TABLET | Freq: Every day | ORAL | Status: DC

## 2015-12-26 MED ORDER — ONDANSETRON HCL 4 MG/2ML IJ SOLN
INTRAMUSCULAR | Status: DC | PRN
  Administered 2015-12-26: 4 mg via INTRAVENOUS

## 2015-12-26 MED ORDER — MEPERIDINE HCL 100 MG/ML IJ SOLN
INTRAMUSCULAR | Status: DC | PRN
  Administered 2015-12-26 (×2): 25 mg via INTRAVENOUS

## 2015-12-26 MED ORDER — METOPROLOL TARTRATE 25 MG PO TABS: 12.5000 mg | ORAL_TABLET | Freq: Two times a day (BID) | ORAL | Status: DC

## 2015-12-26 MED ORDER — MIDAZOLAM HCL 5 MG/5ML IJ SOLN
INTRAMUSCULAR | Status: AC
  Filled 2015-12-26: qty 10

## 2015-12-26 MED ORDER — LIDOCAINE VISCOUS 2 % MT SOLN
OROMUCOSAL | Status: AC
  Filled 2015-12-26: qty 15

## 2015-12-26 MED ORDER — ONDANSETRON HCL 4 MG/2ML IJ SOLN
INTRAMUSCULAR | Status: AC
  Filled 2015-12-26: qty 2

## 2015-12-26 MED ORDER — LIDOCAINE VISCOUS 2 % MT SOLN
OROMUCOSAL | Status: DC | PRN
  Administered 2015-12-26: 3 mL via OROMUCOSAL

## 2015-12-26 MED ORDER — ESOMEPRAZOLE MAGNESIUM 40 MG PO CPDR: 40.0000 mg | DELAYED_RELEASE_CAPSULE | Freq: Every day | ORAL | Status: DC

## 2015-12-26 MED ORDER — MIDAZOLAM HCL 5 MG/5ML IJ SOLN
INTRAMUSCULAR | Status: DC | PRN
  Administered 2015-12-26: 2 mg via INTRAVENOUS
  Administered 2015-12-26 (×3): 1 mg via INTRAVENOUS

## 2015-12-26 MED ORDER — SODIUM CHLORIDE 0.9 % IV SOLN
510.0000 mg | Freq: Once | INTRAVENOUS | Status: DC
  Filled 2015-12-26: qty 17

## 2015-12-26 MED ORDER — STERILE WATER FOR IRRIGATION IR SOLN
Status: DC | PRN
  Administered 2015-12-26: 11:00:00

## 2015-12-26 MED ORDER — PANTOPRAZOLE SODIUM 40 MG PO TBEC: 40.0000 mg | DELAYED_RELEASE_TABLET | Freq: Every day | ORAL | Status: DC

## 2015-12-26 NOTE — Progress Notes (Signed)
Pt awake, alert and oriented x4. Vss. Tolerating diet without problems. Dc instructions and medications gone over along with follow up. Verbalizes understanding. Dc home with wife via wc.

## 2015-12-26 NOTE — Progress Notes (Signed)
Primary Cardiologist: Rozann Lesches MD  Cardiology Specific Problem List: Bradycardia CAD  Subjective:    Feels better. No chest pain, abdominal pain or dyspnea. No dizziness.  Objective:   Temp:  [97.5 F (36.4 C)-98.3 F (36.8 C)] 98.3 F (36.8 C) (07/21 0500) Pulse Rate:  [66-89] 66 (07/21 0500) Resp:  [18-20] 20 (07/21 0500) BP: (130-157)/(55-70) 131/55 mmHg (07/21 0500) SpO2:  [96 %-100 %] 96 % (07/21 0500) Weight:  [138 lb 8 oz (62.823 kg)] 138 lb 8 oz (62.823 kg) (07/21 0500) Last BM Date: 12/22/15  Filed Weights   12/24/15 0542 12/25/15 0500 12/26/15 0500  Weight: 139 lb 11.2 oz (63.368 kg) 139 lb 9.6 oz (63.322 kg) 138 lb 8 oz (62.823 kg)   No intake or output data in the 24 hours ending 12/26/15 0811  Telemetry: SR to SB. HR in the 50's and 60's. No pauses.  Exam:  General: No acute distress.  Lungs: Decreased breath sounds at bases.. No wheezes or coughing.   Cardiac: No elevated JVP or bruits. RRR, no gallop or rub.   Abdomen: Normoactive bowel sounds, nontender, nondistended.  Extremities: No pitting edema, distal pulses full.  Neuropsychiatric: Alert and oriented x3, affect appropriate.   Lab Results:  Basic Metabolic Panel:  Recent Labs Lab 12/23/15 1141 12/24/15 0502 12/25/15 0510  NA 135 141 141  K 4.1 4.1 4.1  CL 108 110 108  CO2 25 23 24   GLUCOSE 155* 97 100*  BUN 26* 23* 28*  CREATININE 1.35* 1.17 1.20  CALCIUM 8.5* 9.1 8.8*  MG 2.1  --   --     Liver Function Tests:  Recent Labs Lab 12/23/15 1141  AST 25  ALT 23  ALKPHOS 50  BILITOT 0.6  PROT 6.8  ALBUMIN 3.8    CBC:  Recent Labs Lab 12/24/15 0502 12/25/15 0510 12/26/15 0506  WBC 5.3 4.9 4.0  HGB 8.4* 8.3* 8.7*  HCT 27.6* 27.2* 28.7*  MCV 79.5 79.3 79.3  PLT 281 284 253     Medications:   Scheduled Medications: . atorvastatin  10 mg Oral Daily  . colchicine  0.6 mg Oral Daily  . cyclobenzaprine  10 mg Oral QHS  . ferumoxytol  510 mg  Intravenous Weekly  . isosorbide mononitrate  30 mg Oral Daily  . loratadine  10 mg Oral QHS  . metoprolol tartrate  12.5 mg Oral BID  . pantoprazole  40 mg Oral Daily  . sodium chloride flush  3 mL Intravenous Q12H  . sodium chloride flush  3 mL Intravenous Q12H  . tamsulosin  0.4 mg Oral QHS    Infusions: . sodium chloride      PRN Medications: sodium chloride, acetaminophen **OR** acetaminophen, albuterol, ondansetron **OR** ondansetron (ZOFRAN) IV, senna-docusate, sodium chloride flush   Assessment and Plan:   1. Sinus bradycardia: Heart rate in the 50's and 60's per telemetry over last 24 hours. He is asymptomatic at this time. Fatigued but thinks its related to anemia. Continues on low dose metoprolol 12.5 mg BID (dose already cut back).  2. Multivessel CAD: Hx of CABG adn subsequent PCI, most recently DES to SVG to PDA in December 2016. ASA and Brilinta have been discontinued temporarily in the setting of GI bleed and anemia.   3. GIB: Capsule study completed, with no evidence of active bleeding. Mucosal erosions noted and small AVM's. Planned EGD per GI note.   Phill Myron. Lawrence NP [place]  12/26/2015, 8:11 AM   Attending note:  Patient seen and examined. Modified above note by Ms. Lawrence NP. No overall change from a cardiac perspective. Heart monitor does not show any significant pauses to suspect symptomatology and he is tolerating heart rate in current range after reduction in Lopressor already. GI workup continues. Small bowel capsule study did show mucosal erosions and possible small AVMs, but no active bleeding. He is to undergo EGD later this afternoon. Main question for [place] in terms of his longer term cardiac management is when he could go back on aspirin, and whether it would be possible to place him on Plavix instead of Brilinta. If it is felt that his likelihood of recurrent GI bleeding is fairly high however, we may just go with aspirin alone since he is greater  than 6 months out from his last DES intervention.  Satira Sark, M.D., F.A.C.C.

## 2015-12-26 NOTE — Op Note (Signed)
[name], [name]                 ACCOUNT NO.:  [id number]  MEDICAL RECORD NO.:  [id number]  LOCATION:  [facility]                          FACILITY:  [facility]  PHYSICIAN:  R. Garfield Cornea, MD FACP FACGDATE OF BIRTH:  Nov 08, 1934  DATE OF PROCEDURE:  12/26/2015 DATE OF DISCHARGE:                              OPERATIVE REPORT   INDICATIONS FOR PROCEDURE:  The patient is a pleasant 81 year old gentleman with recurrent GI bleeding on Brilinta and aspirin.  Recent video capsule study small bowel demonstrated nonspecific mucosal lesions scattered throughout the small bowel.  EGD with attempted enteroscopy being done to treat potential bleeding lesions as feasible.  Risks, benefits, limitations, alternatives, and imponderables have been discussed with the patient and family and all parties agreeable.  PROCEDURE NOTE:  O2 saturation, blood pressure, pulse, and respirations are monitored throughout the entirety of this procedure.  Conscious sedation; Versed 5 mg IV and Demerol 50 mg IV in divided doses, Xylocaine gel for topical oropharyngeal anesthesia, Zofran 4 mg IV. Entire service time 17 minutes.  INSTRUMENT:  Slim pediatric colonoscope.  FINDINGS:  Examination of tubular esophagus revealed no mucosal abnormalities.  The stomach was empty, small hiatal hernia present, and normal-appearing gastric mucosa.  Patent pylorus.  Examination of the bulb second and third portion of the duodenum revealed scattered tiny erosions.  There were no AVMs, ulcerative, or any evidence of infiltrating process.  It took some time to advance the colonoscope well into the jejunum.  The mucosa through this segment of the GI tract appeared entirely normal.  No bleeding or suspect bleeding lesion found in the jejunum.  The scope was then withdrawn.  The patient tolerated the procedure well and was reactive to endoscopy.  IMPRESSION:  Small hiatal hernia.  Scattered small duodenal erosions likely Brilinta/aspirin  effect.  No other suspect lesion seen on the esophagogastroduodenoscopy/enteroscopy today.  RECOMMENDATIONS: 1. Advance diet. 2. Follow H and H. 3. Consider stopping Brilinta and continuing aspirin alone depending     on the risk benefit/ assessment in conjunction with Cardiology. 4. No need for urgent colonoscopy at this time.  I have discussed with Dr. Jesus Genera. Garfield Cornea, MD [place]     RMR/MEDQ  D:  12/26/2015  T:  12/26/2015  Job:  [id number]  cc:   Satira Sark, MD

## 2015-12-26 NOTE — Discharge Summary (Signed)
PATIENT DETAILS Name: Billy [name] Age: 81 y.o. Sex: male Date of Birth: 1935-02-17 MRN: [id number]. Admitting Physician: Erline Hau, MD [id number] E, MD  Admit Date: 12/23/2015 Discharge date: 12/26/2015  Recommendations for Outpatient Follow-up:  1. Please follow CBC closely-started on iron supplementation  2. Brilinta discontinued and started on aspirin due to recurrent GI bleeding  PRIMARY DISCHARGE DIAGNOSIS:  Principal Problem:   Syncope Active Problems:   Melena   Symptomatic anemia   Blood loss anemia   Coronary artery disease involving coronary bypass graft of native heart without angina pectoris   IDA (iron deficiency anemia)   Syncope and collapse   Mucosal abnormality of duodenum      PAST MEDICAL HISTORY: Past Medical History  Diagnosis Date  . Coronary atherosclerosis of native coronary artery     Multivessel status post CABG, DES x 2 SVG to OM with staged DES SVG to RCA November 2015, DES to SVG to PDA 05/2015  . Essential hypertension, benign   . Carotid artery disease ([place])     123456 LICA and [id number] RICA - December 2013  . Sinus bradycardia     Asymptomatic  . Gout   . HOH (hard of hearing)     DISCHARGE MEDICATIONS: Current Discharge Medication List    START taking these medications   Details  aspirin EC 81 MG tablet Take 1 tablet (81 mg total) by mouth daily. Qty: 30 tablet, Refills: 0    ferrous sulfate 325 (65 FE) MG tablet Take 1 tablet (325 mg total) by mouth 2 (two) times daily with a meal. Qty: 60 tablet, Refills: 0      CONTINUE these medications which have CHANGED   Details  esomeprazole (NEXIUM) 40 MG capsule Take 1 capsule (40 mg total) by mouth daily at 12 noon. Qty: 30 capsule, Refills: 0    metoprolol tartrate (LOPRESSOR) 25 MG tablet Take 0.5 tablets (12.5 mg total) by mouth 2 (two) times daily. Qty: 30 tablet, Refills: 0      CONTINUE these medications which have NOT CHANGED   Details  albuterol  (PROVENTIL HFA;VENTOLIN HFA) 108 (90 Base) MCG/ACT inhaler Inhale 1-2 puffs into the lungs every 6 (six) hours as needed for wheezing or shortness of breath.    atorvastatin (LIPITOR) 10 MG tablet Take 1 tablet (10 mg total) by mouth daily. Qty: 90 tablet, Refills: 3    COLCRYS 0.6 MG tablet Take 1 tablet by mouth Once daily as needed (for gout).     cyclobenzaprine (FLEXERIL) 10 MG tablet Take 10 mg by mouth at bedtime.    fluticasone (FLONASE) 50 MCG/ACT nasal spray Place 1-2 sprays into both nostrils daily as needed.    fluticasone (FLOVENT HFA) 44 MCG/ACT inhaler Inhale 2 puffs into the lungs 2 (two) times daily.    isosorbide mononitrate (IMDUR) 60 MG 24 hr tablet Take 0.5 tablets (30 mg total) by mouth daily. Qty: 45 tablet, Refills: 3    loratadine (CLARITIN) 10 MG tablet Take 10 mg by mouth at bedtime.    nitroGLYCERIN (NITROSTAT) 0.4 MG SL tablet Place 1 tablet (0.4 mg total) under the tongue every 5 (five) minutes x 3 doses as needed. If no relief after 3rd dose, proceed to the ED for an evalutation Qty: 75 tablet, Refills: 1    tamsulosin (FLOMAX) 0.4 MG CAPS capsule Take 0.4 mg by mouth at bedtime.      STOP taking these medications     AMBULATORY NON FORMULARY MEDICATION  AMBULATORY NON FORMULARY MEDICATION         ALLERGIES:   Allergies  Allergen Reactions  . Protonix [Pantoprazole Sodium] Itching and Rash    BRIEF HPI:  See H&P, Labs, Consult and Test reports for all details in brief, patient is a 81 y.o. male prior history of CAD status post CABG-and PCI SVG -PDA December 2016-on Brilinta-presented with a syncopal episode. Found to be anemic-further history positive for intermittent but ongoing melena for the past 1 month.  CONSULTATIONS:   cardiology and GI  PERTINENT RADIOLOGIC STUDIES: Dg Chest 2 View  12/23/2015  CLINICAL DATA:  Syncope and shortness of breath EXAM: CHEST  2 VIEW COMPARISON:  June 25, 2015 FINDINGS: The lungs are clear. The  heart size and pulmonary vascularity are normal. No adenopathy. There is atherosclerotic calcification throughout the aorta. Patient is status post coronary artery bypass grafting. No bone lesions. IMPRESSION: No edema or consolidation. Aortic atherosclerosis. Patient is status post coronary artery bypass grafting. Electronically Signed   By: Lowella Grip III M.D.   On: 12/23/2015 16:16   Ct Head Wo Contrast  12/23/2015  CLINICAL DATA:  Syncopal episode. EXAM: CT HEAD WITHOUT CONTRAST TECHNIQUE: Contiguous axial images were obtained from the base of the skull through the vertex without intravenous contrast. COMPARISON:  06/07/2015 FINDINGS: The ventricles, cisterns and other CSF spaces are within normal. There is no mass, mass effect, shift of midline structures or acute hemorrhage. No evidence of acute infarction. Remaining bones and soft tissues are within normal. IMPRESSION: No acute intracranial findings. Electronically Signed   By: Marin Olp M.D.   On: 12/23/2015 16:06     PERTINENT LAB RESULTS: CBC:  Recent Labs  12/25/15 0510 12/26/15 0506  WBC 4.9 4.0  HGB 8.3* 8.7*  HCT 27.2* 28.7*  PLT 284 253   CMET CMP     Component Value Date/Time   NA 141 12/25/2015 0510   K 4.1 12/25/2015 0510   CL 108 12/25/2015 0510   CO2 24 12/25/2015 0510   GLUCOSE 100* 12/25/2015 0510   BUN 28* 12/25/2015 0510   CREATININE 1.20 12/25/2015 0510   CALCIUM 8.8* 12/25/2015 0510   PROT 6.8 12/23/2015 1141   ALBUMIN 3.8 12/23/2015 1141   AST 25 12/23/2015 1141   ALT 23 12/23/2015 1141   ALKPHOS 50 12/23/2015 1141   BILITOT 0.6 12/23/2015 1141   GFRNONAA 56* 12/25/2015 0510   GFRAA >60 12/25/2015 0510    GFR Estimated Creatinine Clearance: 44.9 mL/min (by C-G formula based on Cr of 1.2). No results for input(s): LIPASE, AMYLASE in the last 72 hours. No results for input(s): CKTOTAL, CKMB, CKMBINDEX, TROPONINI in the last 72 hours. Invalid input(s): POCBNP No results for input(s):  DDIMER in the last 72 hours. No results for input(s): HGBA1C in the last 72 hours. No results for input(s): CHOL, HDL, LDLCALC, TRIG, CHOLHDL, LDLDIRECT in the last 72 hours.  Recent Labs  12/24/15 0844  TSH 3.947   No results for input(s): VITAMINB12, FOLATE, FERRITIN, TIBC, IRON, RETICCTPCT in the last 72 hours. Coags: No results for input(s): INR in the last 72 hours.  Invalid input(s): PT Microbiology: No results found for this or any previous visit (from the past 240 hour(s)).   BRIEF HOSPITAL COURSE:  Syncope: Suspect this is due to a combination of bradycardia and ongoing slow GI bleed with acute blood loss anemia. Telemetry c negative for major arrhythmias,most recent echocardiogram on 10/02/15 showed preserved ejection fraction of around 55%. Metoprolol decreased  to 12.5 mg twice a day-cardiology following-suspect do not need any further investigations at this time.   Acute blood loss anemia: Ongoing melena for the past 1 month, iron panel consistent with iron deficiency-he was on Brilinta for recent PCI (December 2016).Patient was transfused 1 unit of PRBC on admission, hemoglobin stable since then. He was also given IV iron per pharmacy on 7/20. Plans are to continue to follow hemoglobin closely in the outpatient setting, he will be discharged on oral iron supplementation.   Upper GI bleeding: Ongoing since fall of last year-EGD in December 2016 was without major abnormalities.GI was consulted. capsule endoscopy was performed yesterday-which showed small AVMs, some mucosal erosions. Subsequently underwent endoscopy with small bowel enteroscopy on 7/21, which showed some scattered small duodenal erosions. Recommendations from gastroenterology (spoke with Dr. Gala Romney) are to discontinue Brilinta if able and to continue aspirin along with the PPI.  History of CAD status post CABG with subsequent PCI to graft-most recently DES to SVG-PDA dEC 2016: Given anemia, ongoing GI bleeding for  the past one year-cardiology was consulted-Brilinta was placed on hold. Underwent GI workup. Per cardiology, if felt to be a high risk for recurrent bleeding, recommendations were to stop stop Brilinta and start aspirin. Patient unfortunately has had intermittent but recurrent GI bleeding since this past fall, spoke with Dr. Bosie Helper suggested that patient remains at risk of recurrent bleeding while on dual antiplatelet agents and recommended that patient just be placed on aspirin and PPI. Follow up with primary cardiologist in the next 1-2 weeks.  Bradycardia: Evident on admission-much better after decreasing dose of metoprolol. TSH within normal limits  History of gout: Currently stable without any flare, continue colcichine  Dyslipidemia: Continue statin  Hypertension: Controlled with metoprolol.  TODAY-DAY OF DISCHARGE:  Subjective:   Kelani Pritchett today has no headache,no chest abdominal pain,no new weakness tingling or numbness, feels much better wants to go home today.   Objective:   Blood pressure 131/78, pulse 63, temperature 98.3 F (36.8 C), temperature source Oral, resp. rate 13, height 5\' 7"  (1.702 m), weight 62.596 kg (138 lb), SpO2 94 %.  Intake/Output Summary (Last 24 hours) at 12/26/15 1338 Last data filed at 12/26/15 1155  Gross per 24 hour  Intake    400 ml  Output      0 ml  Net    400 ml   Filed Weights   12/25/15 0500 12/26/15 0500 12/26/15 1051  Weight: 63.322 kg (139 lb 9.6 oz) 62.823 kg (138 lb 8 oz) 62.596 kg (138 lb)    Exam Awake Alert, Oriented *3, No new F.N deficits, Normal affect [place].AT,PERRAL Supple Neck,No JVD, No cervical lymphadenopathy appriciated.  Symmetrical Chest wall movement, Good air movement bilaterally, CTAB RRR,No Gallops,Rubs or new Murmurs, No Parasternal Heave +ve B.Sounds, Abd Soft, Non tender, No organomegaly appriciated, No rebound -guarding or rigidity. No Cyanosis, Clubbing or edema, No new Rash or bruise  DISCHARGE  CONDITION: Stable  DISPOSITION: Home  DISCHARGE INSTRUCTIONS:    Activity:  As tolerated   Get Medicines reviewed and adjusted: Please take all your medications with you for your next visit with your Primary MD  Please request your Primary MD to go over all hospital tests and procedure/radiological results at the follow up, please ask your Primary MD to get all Hospital records sent to his/her office.  If you experience worsening of your admission symptoms, develop shortness of breath, life threatening emergency, suicidal or homicidal thoughts you must seek medical attention immediately by  calling 911 or calling your MD immediately  if symptoms less severe.  You must read complete instructions/literature along with all the possible adverse reactions/side effects for all the Medicines you take and that have been prescribed to you. Take any new Medicines after you have completely understood and accpet all the possible adverse reactions/side effects.   Do not drive when taking Pain medications.   Do not take more than prescribed Pain, Sleep and Anxiety Medications  Special Instructions: If you have smoked or chewed Tobacco  in the last 2 yrs please stop smoking, stop any regular Alcohol  and or any Recreational drug use.  Wear Seat belts while driving.  Please note  You were cared for by a hospitalist during your hospital stay. Once you are discharged, your primary care physician will handle any further medical issues. Please note that NO REFILLS for any discharge medications will be authorized once you are discharged, as it is imperative that you return to your primary care physician (or establish a relationship with a primary care physician if you do not have one) for your aftercare needs so that they can reassess your need for medications and monitor your lab values.   Diet recommendation: Heart Healthy diet  Discharge Instructions    Call MD for:  difficulty breathing, headache  or visual disturbances    Complete by:  As directed      Call MD for:  persistant nausea and vomiting    Complete by:  As directed      Call MD for:  severe uncontrolled pain    Complete by:  As directed      Diet - low sodium heart healthy    Complete by:  As directed      Increase activity slowly    Complete by:  As directed            Follow-up Information    Follow up with Curlene Labrum, MD. Schedule an appointment as soon as possible for a visit in 1 week.   Specialty:  Family Medicine   Why:  Repeat Complete Blood Count   Contact information:   [place] [id number] [phone]       Follow up with Rozann Lesches, MD. Schedule an appointment as soon as possible for a visit in 2 weeks.   Specialty:  Cardiology   Why:  Hospital follow up   Contact information:   [place] [address] [phone]      Total Time spent on discharge equals  45 minutes.  SignedOren Binet 12/26/2015 1:38 PM

## 2015-12-26 NOTE — Progress Notes (Signed)
PROGRESS NOTE        PATIENT DETAILS Name: Billy [name] Age: 81 y.o. Sex: male Date of Birth: [birth year] Admit Date: 12/23/2015 Admitting Physician Estela Leonie Green, MD [id number] E, MD  Brief Narrative: Patient is a 81 y.o. male prior history of CAD status post CABG-and PCI SVG -PDA December 2016-on Brilinta-presented with a syncopal episode. Found to be anemic-further history positive for intermittent but ongoing melena for the past 1 month. Admitted transfused 1 unit of PRBC, cardiology consulted and Brilinta placed on hold, gastroenterology also consulted and underwent a capsule endoscopy-which showed small AVMs and possible erosions in the small bowel. Gastroenterology planning on a endoscopy with enteroscopy today.   Subjective: No melena since admission. He had a bowel movement but brown stools yesterday.  Assessment/Plan: Principal Problem: Syncope: Suspect this is due to a combination of bradycardia and ongoing slow GI bleed with acute blood loss anemia. Telemetry continues to be negative for major arrhythmias,most recent echocardiogram on 10/02/15 showed preserved ejection fraction of around 55%. Metoprolol decreased to 12.5 mg twice a day-cardiology following-suspect do not need any further investigations at this time.   Acute blood loss anemia: Ongoing melena for the past 1 month, iron panel consistent with iron deficiency-he was on Brilinta for recent PCI (December 2016).Brilinta is currently on hold, GI capsule endoscopy was performed yesterday-which showed small AVMs on some mucosal erosions, awaiting endoscopy today. Patient was transfused 1 unit of PRBC on admission, hemoglobin stable since then. He was also given IV iron per pharmacy on 7/20. Continue to follow hemoglobin.   Suspected upper GI bleeding: Ongoing since fall of last year-EGD in December 2016 was without major abnormalities. Suspicion for a small bowel bleeding, capsule  endoscopy done on 7/20 which showed AVMs and small bowel mucosal erosions, GI planning on endoscopy today.   History of CAD status post CABG with subsequent PCI to graft-most recently DES to SVG-PDA dEC 2016: Given anemia, ongoing GI bleeding for the past one year-cardiology was consulted-Brilinta is currently on hold. We will await results of EGD-but likely will remain at risk of recurrent GI bleeding in the future (as this is ongoing since fall of last year), per cardiology if bleeding risk is felt to be high then recommendations are to stop Brilinta and start aspirin.   Bradycardia: Evident on admission-much better after decreasing dose of metoprolol. TSH within normal limits  History of gout: Currently stable without any flare, continue colcichine  Dyslipidemia: Continue statin  Hypertension: Controlled with metoprolol.  DVT Prophylaxis:  SCD's  Code Status: Full code   Family Communication: None at bedside-spoke with spouse on 7/19.  Disposition Plan: Remain inpatient-await GI workup to determine timing of disposition.   Antimicrobial agents: None  Procedures: None  CONSULTS:  cardiology and GI  Time spent: 25- minutes-Greater than 50% of this time was spent in counseling, explanation of diagnosis, planning of further management, and coordination of care.  MEDICATIONS: Anti-infectives    None      Scheduled Meds: . atorvastatin  10 mg Oral Daily  . colchicine  0.6 mg Oral Daily  . cyclobenzaprine  10 mg Oral QHS  . ferumoxytol  510 mg Intravenous Weekly  . isosorbide mononitrate  30 mg Oral Daily  . loratadine  10 mg Oral QHS  . metoprolol tartrate  12.5 mg Oral BID  . pantoprazole  40 mg Oral Daily  . sodium chloride flush  3 mL Intravenous Q12H  . sodium chloride flush  3 mL Intravenous Q12H  . tamsulosin  0.4 mg Oral QHS   Continuous Infusions: . sodium chloride     PRN Meds:.sodium chloride, acetaminophen **OR** acetaminophen, albuterol, ondansetron  **OR** ondansetron (ZOFRAN) IV, senna-docusate, sodium chloride flush   PHYSICAL EXAM: Vital signs: Filed Vitals:   12/25/15 0848 12/25/15 1300 12/25/15 2230 12/26/15 0500  BP: 136/58 130/67 157/70 131/55  Pulse: 67 89 67 66  Temp: 97.7 F (36.5 C) 97.5 F (36.4 C) 98 F (36.7 C) 98.3 F (36.8 C)  TempSrc: Oral Oral Oral Oral  Resp: 19 18 20 20   Height:      Weight:    62.823 kg (138 lb 8 oz)  SpO2: 100% 100% 97% 96%   Filed Weights   12/24/15 0542 12/25/15 0500 12/26/15 0500  Weight: 63.368 kg (139 lb 11.2 oz) 63.322 kg (139 lb 9.6 oz) 62.823 kg (138 lb 8 oz)   Body mass index is 21.69 kg/(m^2).   Gen Exam: Awake and alert with clear speech. Not in any distress Neck: Supple, No JVD.   Chest: B/L Clear.   CVS: S1 S2 Regular, no murmurs.  Abdomen: soft, BS +, non tender, non distended.  Extremities: no edema, lower extremities warm to touch. Neurologic: Non Focal.   Skin: No Rash or lesions   Wounds: N/A.  LABORATORY DATA: CBC:  Recent Labs Lab 12/23/15 1141 12/23/15 2310 12/24/15 0502 12/25/15 0510 12/26/15 0506  WBC 5.3 4.7 5.3 4.9 4.0  HGB 7.2* 8.3* 8.4* 8.3* 8.7*  HCT 24.3* 27.0* 27.6* 27.2* 28.7*  MCV 78.1 78.9 79.5 79.3 79.3  PLT 279 261 281 284 [id number]    Basic Metabolic Panel:  Recent Labs Lab 12/23/15 1141 12/24/15 0502 12/25/15 0510  NA 135 141 141  K 4.1 4.1 4.1  CL 108 110 108  CO2 25 23 24   GLUCOSE 155* 97 100*  BUN 26* 23* 28*  CREATININE 1.35* 1.17 1.20  CALCIUM 8.5* 9.1 8.8*  MG 2.1  --   --     GFR: Estimated Creatinine Clearance: 45.1 mL/min (by C-G formula based on Cr of 1.2).  Liver Function Tests:  Recent Labs Lab 12/23/15 1141  AST 25  ALT 23  ALKPHOS 50  BILITOT 0.6  PROT 6.8  ALBUMIN 3.8   No results for input(s): LIPASE, AMYLASE in the last 168 hours. No results for input(s): AMMONIA in the last 168 hours.  Coagulation Profile: No results for input(s): INR, PROTIME in the last 168 hours.  Cardiac  Enzymes: No results for input(s): CKTOTAL, CKMB, CKMBINDEX, TROPONINI in the last 168 hours.  BNP (last 3 results) No results for input(s): PROBNP in the last 8760 hours.  HbA1C: No results for input(s): HGBA1C in the last 72 hours.  CBG:  Recent Labs Lab 12/23/15 1207 12/25/15 0729 12/25/15 1121 12/26/15 0736  GLUCAP 167* 102* 114* 85    Lipid Profile: No results for input(s): CHOL, HDL, LDLCALC, TRIG, CHOLHDL, LDLDIRECT in the last 72 hours.  Thyroid Function Tests:  Recent Labs  12/24/15 0844  TSH 3.947    Anemia Panel:  Recent Labs  12/23/15 1300  VITAMINB12 299  FOLATE 21.7  FERRITIN 12*  TIBC 535*  IRON 20*  RETICCTPCT 2.3    Urine analysis:    Component Value Date/Time   COLORURINE STRAW* 12/23/2015 1230   APPEARANCEUR CLEAR 12/23/2015 1230   LABSPEC 1.020 12/23/2015  [place] 6.0 12/23/2015 [place] 12/23/2015 1230   HGBUR NEGATIVE 12/23/2015 [place] 12/23/2015 1230   KETONESUR NEGATIVE 12/23/2015 1230   PROTEINUR NEGATIVE 12/23/2015 1230   NITRITE NEGATIVE 12/23/2015 1230   LEUKOCYTESUR NEGATIVE 12/23/2015 1230    Sepsis Labs: Lactic Acid, Venous No results found for: LATICACIDVEN  MICROBIOLOGY: No results found for this or any previous visit (from the past 240 hour(s)).  RADIOLOGY STUDIES/RESULTS: Dg Chest 2 View  12/23/2015  CLINICAL DATA:  Syncope and shortness of breath EXAM: CHEST  2 VIEW COMPARISON:  June 25, 2015 FINDINGS: The lungs are clear. The heart size and pulmonary vascularity are normal. No adenopathy. There is atherosclerotic calcification throughout the aorta. Patient is status post coronary artery bypass grafting. No bone lesions. IMPRESSION: No edema or consolidation. Aortic atherosclerosis. Patient is status post coronary artery bypass grafting. Electronically Signed   By: Lowella Grip III M.D.   On: 12/23/2015 16:16   Ct Head Wo Contrast  12/23/2015  CLINICAL DATA:   Syncopal episode. EXAM: CT HEAD WITHOUT CONTRAST TECHNIQUE: Contiguous axial images were obtained from the base of the skull through the vertex without intravenous contrast. COMPARISON:  06/07/2015 FINDINGS: The ventricles, cisterns and other CSF spaces are within normal. There is no mass, mass effect, shift of midline structures or acute hemorrhage. No evidence of acute infarction. Remaining bones and soft tissues are within normal. IMPRESSION: No acute intracranial findings. Electronically Signed   By: Marin Olp M.D.   On: 12/23/2015 16:06       Oren Binet, MD  [facility] Pager:[phone]  If 7PM-7AM, please contact night-coverage [email or web address] Password [email or web address] 12/26/2015, 9:08 AM

## 2016-01-01 ENCOUNTER — Encounter (HOSPITAL_COMMUNITY): Payer: Self-pay | Admitting: Internal Medicine

## 2016-01-02 DIAGNOSIS — R5383 Other fatigue: Secondary | ICD-10-CM | POA: Diagnosis not present

## 2016-01-02 DIAGNOSIS — D649 Anemia, unspecified: Secondary | ICD-10-CM | POA: Diagnosis not present

## 2016-01-07 NOTE — Progress Notes (Signed)
Cardiology Office Note  Date: 01/19/2016   ID: Billy [name], DOB 15-Apr-1935, MRN [id number]  PCP: Curlene Labrum, MD  Primary Cardiologist: Rozann Lesches, MD   Chief Complaint  Patient presents with  . Coronary Artery Disease    History of Present Illness: Billy [name] is a 81 y.o. male last seen in May. Interval records reviewed, he was hospitalized in July with GI bleed. Aspirin and Brilinta were stopped temporarily he did require transfusion of 1 unit PRBCs, also given IV iron, hemoglobin as low as 7.2. He was seen by gastroenterology. He had a negative capsule study for active source of bleeding, mucosal erosions noted as well as small AVMs. EGD showed small hiatal hernia with scattered small duodenal erosions felt to be secondary to DAPT. Aspirin alone was initiated at discharge. Of note, he was also bradycardic during hospital stay, although not entirely clear whether this was symptom provoking. Beta blocker dose was cut back.  He is here with his wife for a follow-up visit. Reports improved energy, did have one episode of angina requiring nitroglycerin in the interim. States that his hemoglobin has improved although not normalized based on follow-up lab work per Dr. Pleas Koch, we are requesting the results. Stools remain dark, however on iron supplementation. No bright red blood noted.  At the last visit we started low-dose low Lipitor with prior intolerances to simvastatin in Crestor. He has tolerated this so far.  Past Medical History:  Diagnosis Date  . Carotid artery disease ([place])    123456 LICA and [id number] RICA - December 2013  . Coronary atherosclerosis of native coronary artery    Multivessel status post CABG, DES x 2 SVG to OM with staged DES SVG to RCA November 2015, DES to SVG to PDA 05/2015  . Essential hypertension, benign   . Gout   . HOH (hard of hearing)   . Sinus bradycardia    Asymptomatic    Past Surgical History:  Procedure Laterality Date  .  APPENDECTOMY     Ruptured  . CARDIAC CATHETERIZATION N/A 05/19/2015   Procedure: Left Heart Cath and Cors/Grafts Angiography;  Surgeon: Lorretta Harp, MD;  Location: [facility];  Service: Cardiovascular;  Laterality: N/A;  . CARDIAC CATHETERIZATION N/A 05/19/2015   Procedure: Coronary Stent Intervention;  Surgeon: Lorretta Harp, MD;  Location: [facility] [place];  Service: Cardiovascular;  Laterality: N/A;  . CATARACT EXTRACTION W/PHACO Right 11/13/2012   Procedure: CATARACT EXTRACTION PHACO AND INTRAOCULAR LENS PLACEMENT (IOC);  Surgeon: Tonny Branch, MD;  Location: [facility];  Service: Ophthalmology;  Laterality: Right;  CDE:12.75  . CATARACT EXTRACTION W/PHACO Left 01/11/2013   Procedure: CATARACT EXTRACTION PHACO AND INTRAOCULAR LENS PLACEMENT (IOC);  Surgeon: Tonny Branch, MD;  Location: [facility];  Service: Ophthalmology;  Laterality: Left;  CDE: 21.13  . COLONOSCOPY  09/2013   Dr. Burke Keels: colitis descending colon and sigmoid colon.Bx ?Cdiff vs ischemic.  Marland Kitchen CORONARY ARTERY BYPASS GRAFT  11/09/1999   LIMA to LAD, SVG to diagonal, SVG to OM1 and OM2, SVG to PDA  . ENTEROSCOPY N/A 12/26/2015   Procedure: ENTEROSCOPY;  Surgeon: Daneil Dolin, MD;  [facility];  Service: Endoscopy;  Laterality: N/A;  . ESOPHAGOGASTRODUODENOSCOPY N/A 05/20/2015   Procedure: ESOPHAGOGASTRODUODENOSCOPY (EGD);  Surgeon: Wilford Corner, MD;  Location: [facility] ENDOSCOPY;  Service: Endoscopy;  Laterality: N/A;  . ESOPHAGOGASTRODUODENOSCOPY N/A 12/26/2015   Procedure: ESOPHAGOGASTRODUODENOSCOPY (EGD);  Surgeon: Daneil Dolin, MD;  [facility];  Service: Endoscopy;  Laterality: N/A;  . GIVENS CAPSULE STUDY N/A 12/24/2015   Procedure: GIVENS CAPSULE STUDY;  Surgeon: Rogene Houston, MD;  [facility];  Service: Endoscopy;  Laterality: N/A;  . LEFT HEART CATHETERIZATION WITH CORONARY ANGIOGRAM N/A 04/22/2014   Procedure: LEFT HEART CATHETERIZATION WITH CORONARY ANGIOGRAM;  Surgeon:  Burnell Blanks, MD;  Location: [facility] CATH LAB;  Service: Cardiovascular;  Laterality: N/A;  . PERCUTANEOUS CORONARY STENT INTERVENTION (PCI-S) N/A 04/25/2014   Procedure: PERCUTANEOUS CORONARY STENT INTERVENTION (PCI-S);  Surgeon: Peter M Martinique, MD;  Location: [facility] CATH LAB;  Service: Cardiovascular;  Laterality: N/A;    Current Outpatient Prescriptions  Medication Sig Dispense Refill  . albuterol (PROVENTIL HFA;VENTOLIN HFA) 108 (90 Base) MCG/ACT inhaler Inhale 1-2 puffs into the lungs every 6 (six) hours as needed for wheezing or shortness of breath.    Marland Kitchen aspirin EC 81 MG tablet Take 1 tablet (81 mg total) by mouth daily. 30 tablet 0  . atorvastatin (LIPITOR) 10 MG tablet Take 1 tablet (10 mg total) by mouth daily. 90 tablet 3  . COLCRYS 0.6 MG tablet Take 1 tablet by mouth Once daily as needed (for gout).     . cyclobenzaprine (FLEXERIL) 10 MG tablet Take 10 mg by mouth at bedtime.    Marland Kitchen esomeprazole (NEXIUM) 40 MG capsule Take 1 capsule (40 mg total) by mouth daily at 12 noon. 30 capsule 0  . ferrous sulfate 325 (65 FE) MG tablet Take 1 tablet (325 mg total) by mouth 2 (two) times daily with a meal. 60 tablet 0  . fluticasone (FLOVENT HFA) 44 MCG/ACT inhaler Inhale 2 puffs into the lungs 2 (two) times daily.    . isosorbide mononitrate (IMDUR) 60 MG 24 hr tablet Take 0.5 tablets (30 mg total) by mouth daily. 45 tablet 3  . loratadine (CLARITIN) 10 MG tablet Take 10 mg by mouth at bedtime.    . metoprolol tartrate (LOPRESSOR) 25 MG tablet Take 0.5 tablets (12.5 mg total) by mouth 2 (two) times daily. 30 tablet 0  . nitroGLYCERIN (NITROSTAT) 0.4 MG SL tablet Place 1 tablet (0.4 mg total) under the tongue every 5 (five) minutes x 3 doses as needed. If no relief after 3rd dose, proceed to the ED for an evalutation 75 tablet 1  . tamsulosin (FLOMAX) 0.4 MG CAPS capsule Take 0.4 mg by mouth at bedtime.     No current facility-administered medications for this visit.    Allergies:  Protonix  [pantoprazole sodium]   Social History: The patient  reports that he quit smoking about 16 years ago. His smoking use included Cigarettes. He started smoking about 69 years ago. He has a 25.00 pack-year smoking history. He has never used smokeless tobacco. He reports that he does not drink alcohol or use drugs.   ROS:  Please see the history of present illness. Otherwise, complete review of systems is positive for arthritic knee stiffness.  All other systems are reviewed and negative.   Physical Exam: VS:  BP 118/60   Pulse (!) 50   Ht 5\' 7"  (1.702 m)   Wt 146 lb (66.2 kg)   SpO2 98%   BMI 22.87 kg/m , BMI Body mass index is 22.87 kg/m.  Wt Readings from Last 3 Encounters:  01/19/16 146 lb (66.2 kg)  12/26/15 138 lb (62.6 kg)  11/04/15 142 lb 9.6 oz (64.7 kg)    General: Patient appears comfortable at rest. HEENT: Conjunctiva and lids normal, oropharynx clear with moist  mucosa. Neck: Supple, no elevated JVP or carotid bruits, no thyromegaly. Lungs: Clear to auscultation, nonlabored breathing at rest. Cardiac: Regular rate and rhythm, no S3 or significant systolic murmur, no pericardial rub. Abdomen: Soft, nontender, no hepatomegaly, bowel sounds present, no guarding or rebound. Extremities: No pitting edema, distal pulses 2+. Musculoskeletal: No kyphosis. Neuropsychiatric: Alert and oriented 3, affect appropriate.  ECG: I personally reviewed the tracing from 12/24/2015 which showed sinus bradycardia with old anterior infarct pattern and PVCs, nonspecific ST changes.  Recent Labwork: 12/23/2015: ALT 23; AST 25; Magnesium 2.1 12/24/2015: TSH 3.947 12/25/2015: BUN 28; Creatinine, Ser 1.20; Potassium 4.1; Sodium 141 12/26/2015: Hemoglobin 8.7; Platelets 253     Component Value Date/Time   CHOL 134 04/23/2014 0057   TRIG 99 04/23/2014 0057   HDL 33 (L) 04/23/2014 0057   CHOLHDL 4.1 04/23/2014 0057   VLDL 20 04/23/2014 0057   LDLCALC 81 04/23/2014 0057    Other Studies Reviewed  Today:  Echocardiogram 10/02/2015: Study Conclusions  - Left ventricle: The cavity size was at the upper limits of  normal. Wall thickness was normal. The estimated ejection  fraction was 55%. There is akinesis of the basal-midinferolateral  and inferior myocardium. Features are consistent with a  pseudonormal left ventricular filling pattern, with concomitant  abnormal relaxation and increased filling pressure (grade 2  diastolic dysfunction). - Aortic valve: Mildly calcified annulus. Trileaflet; mildly  calcified leaflets. There was trivial regurgitation. - Mitral valve: Mildly thickened leaflets . There was mild to  moderate regurgitation. - Left atrium: The atrium was moderately dilated. - Right atrium: Central venous pressure (est): 3 mm Hg. - Atrial septum: No defect or patent foramen ovale was identified. - Tricuspid valve: There was trivial regurgitation. - Pulmonary arteries: PA peak pressure: 36 mm Hg (S). - Pericardium, extracardiac: There was no pericardial effusion.  Impressions:  - Upper normal LV chamber size with normal wall thickness and LVEF  approximately 55%. Akinesis of the mid to basal inferoposterior  myocardium consistent with previous infarct. Grade 2 diastolic  dysfunction with increased LV filling pressure. Moderate left  atrial enlargement. Mildly thickened mitral leaflets with mild to  moderate mitral regurgitation. Mildly sclerotic aortic valve with  trivial aortic regurgitation. Trivial tricuspid regurgitation  with PASP estimated 36 mmHg.   Assessment and Plan:  1. Multivessel CAD status post CABG with subsequently documented graft disease, most recent intervention being DES to the SVG to PDA in December 2016. He is on aspirin, having stopped Brilinta and the setting of recent GI bleed. We willl hold off on DAPT at this time. No other change to cardiac regimen.  2. Recent GI bleed with hemoglobin down to 7.2. He underwent capsule  endoscopy and EGD with findings as outlined above. Given potential for rebleeding we will hold off on resuming DAPT at this point. Continuing on iron supplements, requesting lab work from Dr. Pleas Koch.  3. Essential hypertension, blood pressure is well controlled today.  4. Hyperlipidemia, tolerating switch from Zocor to Lipitor. We will plan to reassess FLP in the next 3-6 months.  Current medicines were reviewed with the patient today.  Disposition: Follow-up with me in 3 months.  Signed, Satira Sark, MD, [facility] 01/19/2016 11:33 AM    [facility] [place], [place], [place] [address] Phone: [phone]; Fax: [phone]

## 2016-01-19 ENCOUNTER — Encounter: Payer: Self-pay | Admitting: *Deleted

## 2016-01-19 ENCOUNTER — Encounter: Payer: Self-pay | Admitting: Cardiology

## 2016-01-19 ENCOUNTER — Ambulatory Visit (INDEPENDENT_AMBULATORY_CARE_PROVIDER_SITE_OTHER): Payer: Medicare Other | Admitting: Cardiology

## 2016-01-19 VITALS — BP 118/60 | HR 50 | Ht 67.0 in | Wt 146.0 lb

## 2016-01-19 DIAGNOSIS — I10 Essential (primary) hypertension: Secondary | ICD-10-CM | POA: Diagnosis not present

## 2016-01-19 DIAGNOSIS — E782 Mixed hyperlipidemia: Secondary | ICD-10-CM | POA: Diagnosis not present

## 2016-01-19 DIAGNOSIS — I25810 Atherosclerosis of coronary artery bypass graft(s) without angina pectoris: Secondary | ICD-10-CM | POA: Diagnosis not present

## 2016-01-19 DIAGNOSIS — Z8719 Personal history of other diseases of the digestive system: Secondary | ICD-10-CM | POA: Diagnosis not present

## 2016-01-19 NOTE — Patient Instructions (Signed)
Medication Instructions:   Your physician recommends that you continue on your current medications as directed. Please refer to the Current Medication list given to you today.  Labwork:  NONE  Testing/Procedures:  NONE  Follow-Up:  Your physician recommends that you schedule a follow-up appointment in: 3 months.  Any Other Special Instructions Will Be Listed Below (If Applicable).  If you need a refill on your cardiac medications before your next appointment, please call your pharmacy. 

## 2016-01-25 DIAGNOSIS — R55 Syncope and collapse: Secondary | ICD-10-CM | POA: Diagnosis not present

## 2016-01-25 DIAGNOSIS — D500 Iron deficiency anemia secondary to blood loss (chronic): Secondary | ICD-10-CM | POA: Diagnosis not present

## 2016-01-25 DIAGNOSIS — I25812 Atherosclerosis of bypass graft of coronary artery of transplanted heart without angina pectoris: Secondary | ICD-10-CM | POA: Diagnosis not present

## 2016-01-25 DIAGNOSIS — I10 Essential (primary) hypertension: Secondary | ICD-10-CM | POA: Diagnosis not present

## 2016-01-25 DIAGNOSIS — K260 Acute duodenal ulcer with hemorrhage: Secondary | ICD-10-CM | POA: Diagnosis not present

## 2016-03-08 DIAGNOSIS — D500 Iron deficiency anemia secondary to blood loss (chronic): Secondary | ICD-10-CM | POA: Diagnosis not present

## 2016-03-08 DIAGNOSIS — K260 Acute duodenal ulcer with hemorrhage: Secondary | ICD-10-CM | POA: Diagnosis not present

## 2016-03-08 DIAGNOSIS — Z6822 Body mass index (BMI) 22.0-22.9, adult: Secondary | ICD-10-CM | POA: Diagnosis not present

## 2016-03-08 DIAGNOSIS — J449 Chronic obstructive pulmonary disease, unspecified: Secondary | ICD-10-CM | POA: Diagnosis not present

## 2016-03-08 DIAGNOSIS — K921 Melena: Secondary | ICD-10-CM | POA: Diagnosis not present

## 2016-03-08 DIAGNOSIS — I10 Essential (primary) hypertension: Secondary | ICD-10-CM | POA: Diagnosis not present

## 2016-03-11 DIAGNOSIS — K254 Chronic or unspecified gastric ulcer with hemorrhage: Secondary | ICD-10-CM | POA: Diagnosis not present

## 2016-03-11 DIAGNOSIS — N281 Cyst of kidney, acquired: Secondary | ICD-10-CM | POA: Diagnosis not present

## 2016-03-31 DIAGNOSIS — M1A9XX1 Chronic gout, unspecified, with tophus (tophi): Secondary | ICD-10-CM | POA: Diagnosis not present

## 2016-03-31 DIAGNOSIS — M1711 Unilateral primary osteoarthritis, right knee: Secondary | ICD-10-CM | POA: Diagnosis not present

## 2016-03-31 DIAGNOSIS — M1A0721 Idiopathic chronic gout, left ankle and foot, with tophus (tophi): Secondary | ICD-10-CM | POA: Diagnosis not present

## 2016-03-31 DIAGNOSIS — M1712 Unilateral primary osteoarthritis, left knee: Secondary | ICD-10-CM | POA: Diagnosis not present

## 2016-03-31 DIAGNOSIS — Z6822 Body mass index (BMI) 22.0-22.9, adult: Secondary | ICD-10-CM | POA: Diagnosis not present

## 2016-04-06 DIAGNOSIS — M1A9XX1 Chronic gout, unspecified, with tophus (tophi): Secondary | ICD-10-CM | POA: Diagnosis not present

## 2016-04-06 DIAGNOSIS — D500 Iron deficiency anemia secondary to blood loss (chronic): Secondary | ICD-10-CM | POA: Diagnosis not present

## 2016-04-06 DIAGNOSIS — J449 Chronic obstructive pulmonary disease, unspecified: Secondary | ICD-10-CM | POA: Diagnosis not present

## 2016-04-06 DIAGNOSIS — R55 Syncope and collapse: Secondary | ICD-10-CM | POA: Diagnosis not present

## 2016-04-06 DIAGNOSIS — E7800 Pure hypercholesterolemia, unspecified: Secondary | ICD-10-CM | POA: Diagnosis not present

## 2016-04-06 DIAGNOSIS — R5383 Other fatigue: Secondary | ICD-10-CM | POA: Diagnosis not present

## 2016-04-08 DIAGNOSIS — I25812 Atherosclerosis of bypass graft of coronary artery of transplanted heart without angina pectoris: Secondary | ICD-10-CM | POA: Diagnosis not present

## 2016-04-08 DIAGNOSIS — Z23 Encounter for immunization: Secondary | ICD-10-CM | POA: Diagnosis not present

## 2016-04-08 DIAGNOSIS — Z0001 Encounter for general adult medical examination with abnormal findings: Secondary | ICD-10-CM | POA: Diagnosis not present

## 2016-04-08 DIAGNOSIS — H6121 Impacted cerumen, right ear: Secondary | ICD-10-CM | POA: Diagnosis not present

## 2016-04-22 ENCOUNTER — Emergency Department (HOSPITAL_COMMUNITY): Payer: Medicare Other

## 2016-04-22 ENCOUNTER — Inpatient Hospital Stay (HOSPITAL_COMMUNITY)
Admission: EM | Admit: 2016-04-22 | Discharge: 2016-04-24 | DRG: 246 | Disposition: A | Discharge status: Home / Self Care | Payer: Medicare Other | Attending: Family Medicine | Admitting: Family Medicine

## 2016-04-22 ENCOUNTER — Encounter (HOSPITAL_COMMUNITY): Payer: Self-pay

## 2016-04-22 DIAGNOSIS — I2582 Chronic total occlusion of coronary artery: Secondary | ICD-10-CM | POA: Diagnosis present

## 2016-04-22 DIAGNOSIS — I25700 Atherosclerosis of coronary artery bypass graft(s), unspecified, with unstable angina pectoris: Secondary | ICD-10-CM | POA: Diagnosis not present

## 2016-04-22 DIAGNOSIS — H9190 Unspecified hearing loss, unspecified ear: Secondary | ICD-10-CM | POA: Diagnosis not present

## 2016-04-22 DIAGNOSIS — Z7982 Long term (current) use of aspirin: Secondary | ICD-10-CM

## 2016-04-22 DIAGNOSIS — E782 Mixed hyperlipidemia: Secondary | ICD-10-CM | POA: Diagnosis not present

## 2016-04-22 DIAGNOSIS — Y848 Other medical procedures as the cause of abnormal reaction of the patient, or of later complication, without mention of misadventure at the time of the procedure: Secondary | ICD-10-CM | POA: Diagnosis present

## 2016-04-22 DIAGNOSIS — I200 Unstable angina: Secondary | ICD-10-CM | POA: Diagnosis not present

## 2016-04-22 DIAGNOSIS — Z7951 Long term (current) use of inhaled steroids: Secondary | ICD-10-CM | POA: Diagnosis not present

## 2016-04-22 DIAGNOSIS — K319 Disease of stomach and duodenum, unspecified: Secondary | ICD-10-CM | POA: Diagnosis not present

## 2016-04-22 DIAGNOSIS — I25810 Atherosclerosis of coronary artery bypass graft(s) without angina pectoris: Secondary | ICD-10-CM | POA: Diagnosis present

## 2016-04-22 DIAGNOSIS — R079 Chest pain, unspecified: Secondary | ICD-10-CM | POA: Diagnosis not present

## 2016-04-22 DIAGNOSIS — T82867A Thrombosis of cardiac prosthetic devices, implants and grafts, initial encounter: Secondary | ICD-10-CM | POA: Diagnosis not present

## 2016-04-22 DIAGNOSIS — I214 Non-ST elevation (NSTEMI) myocardial infarction: Secondary | ICD-10-CM | POA: Diagnosis present

## 2016-04-22 DIAGNOSIS — I2510 Atherosclerotic heart disease of native coronary artery without angina pectoris: Secondary | ICD-10-CM | POA: Diagnosis not present

## 2016-04-22 DIAGNOSIS — I10 Essential (primary) hypertension: Secondary | ICD-10-CM | POA: Diagnosis not present

## 2016-04-22 DIAGNOSIS — I129 Hypertensive chronic kidney disease with stage 1 through stage 4 chronic kidney disease, or unspecified chronic kidney disease: Secondary | ICD-10-CM | POA: Diagnosis present

## 2016-04-22 DIAGNOSIS — Z87891 Personal history of nicotine dependence: Secondary | ICD-10-CM | POA: Diagnosis not present

## 2016-04-22 DIAGNOSIS — Z961 Presence of intraocular lens: Secondary | ICD-10-CM | POA: Diagnosis not present

## 2016-04-22 DIAGNOSIS — Z809 Family history of malignant neoplasm, unspecified: Secondary | ICD-10-CM

## 2016-04-22 DIAGNOSIS — N183 Chronic kidney disease, stage 3 unspecified: Secondary | ICD-10-CM | POA: Diagnosis present

## 2016-04-22 DIAGNOSIS — Z79899 Other long term (current) drug therapy: Secondary | ICD-10-CM | POA: Diagnosis not present

## 2016-04-22 DIAGNOSIS — I252 Old myocardial infarction: Secondary | ICD-10-CM | POA: Diagnosis not present

## 2016-04-22 LAB — CBC WITH DIFFERENTIAL/PLATELET
BASOS ABS: 0 10*3/uL (ref 0.0–0.1)
BASOS PCT: 0 %
EOS PCT: 1 %
Eosinophils Absolute: 0.1 10*3/uL (ref 0.0–0.7)
HCT: 41 % (ref 39.0–52.0)
Hemoglobin: 13.7 g/dL (ref 13.0–17.0)
Lymphocytes Relative: 20 %
Lymphs Abs: 1.4 10*3/uL (ref 0.7–4.0)
MCH: 29.2 pg (ref 26.0–34.0)
MCHC: 33.4 g/dL (ref 30.0–36.0)
MCV: 87.4 fL (ref 78.0–100.0)
MONO ABS: 0.4 10*3/uL (ref 0.1–1.0)
MONOS PCT: 6 %
Neutro Abs: 5.1 10*3/uL (ref 1.7–7.7)
Neutrophils Relative %: 73 %
PLATELETS: 283 10*3/uL (ref 150–400)
RBC: 4.69 MIL/uL (ref 4.22–5.81)
RDW: 14.3 % (ref 11.5–15.5)
WBC: 7 10*3/uL (ref 4.0–10.5)

## 2016-04-22 LAB — COMPREHENSIVE METABOLIC PANEL
ALBUMIN: 4.4 g/dL (ref 3.5–5.0)
ALK PHOS: 64 U/L (ref 38–126)
ALT: 35 U/L (ref 17–63)
AST: 26 U/L (ref 15–41)
Anion gap: 7 (ref 5–15)
BILIRUBIN TOTAL: 0.6 mg/dL (ref 0.3–1.2)
BUN: 33 mg/dL — AB (ref 6–20)
CALCIUM: 9.6 mg/dL (ref 8.9–10.3)
CO2: 27 mmol/L (ref 22–32)
Chloride: 103 mmol/L (ref 101–111)
Creatinine, Ser: 1.83 mg/dL — ABNORMAL HIGH (ref 0.61–1.24)
GFR calc Af Amer: 39 mL/min — ABNORMAL LOW (ref >60)
GFR calc non Af Amer: 33 mL/min — ABNORMAL LOW (ref >60)
GLUCOSE: 176 mg/dL — AB (ref 65–99)
Potassium: 3.7 mmol/L (ref 3.5–5.1)
Sodium: 137 mmol/L (ref 135–145)
TOTAL PROTEIN: 7.6 g/dL (ref 6.5–8.1)

## 2016-04-22 LAB — PROTIME-INR
INR: 0.98
PROTHROMBIN TIME: 13 s (ref 11.4–15.2)

## 2016-04-22 LAB — I-STAT TROPONIN, ED: Troponin i, poc: 0.01 ng/mL (ref 0.00–0.08)

## 2016-04-22 MED ORDER — ATORVASTATIN CALCIUM 80 MG PO TABS
80.0000 mg | ORAL_TABLET | Freq: Every day | ORAL | Status: DC
  Administered 2016-04-22: 80 mg via ORAL
  Filled 2016-04-22: qty 1

## 2016-04-22 MED ORDER — METOPROLOL TARTRATE 25 MG PO TABS
25.0000 mg | ORAL_TABLET | Freq: Two times a day (BID) | ORAL | Status: DC
  Administered 2016-04-22 – 2016-04-24 (×4): 25 mg via ORAL
  Filled 2016-04-22 (×3): qty 1

## 2016-04-22 MED ORDER — NITROGLYCERIN IN D5W 200-5 MCG/ML-% IV SOLN
INTRAVENOUS | Status: AC
  Administered 2016-04-22: 15 ug/min via INTRAVENOUS
  Filled 2016-04-22: qty 250

## 2016-04-22 MED ORDER — NITROGLYCERIN IN D5W 200-5 MCG/ML-% IV SOLN
2.0000 ug/min | INTRAVENOUS | Status: DC
  Administered 2016-04-22: 15 ug/min via INTRAVENOUS
  Administered 2016-04-22: 2 ug/min via INTRAVENOUS
  Administered 2016-04-24: 25 ug/min via INTRAVENOUS

## 2016-04-22 MED ORDER — MORPHINE SULFATE (PF) 2 MG/ML IV SOLN
2.0000 mg | Freq: Once | INTRAVENOUS | Status: AC
  Administered 2016-04-22: 2 mg via INTRAVENOUS
  Filled 2016-04-22: qty 1

## 2016-04-22 MED ORDER — NITROGLYCERIN 0.4 MG SL SUBL
0.4000 mg | SUBLINGUAL_TABLET | SUBLINGUAL | Status: AC | PRN
  Administered 2016-04-22 (×3): 0.4 mg via SUBLINGUAL
  Filled 2016-04-22: qty 1

## 2016-04-22 MED ORDER — DEXTROSE-NACL 5-0.9 % IV SOLN
INTRAVENOUS | Status: DC
  Administered 2016-04-22 – 2016-04-23 (×2): via INTRAVENOUS

## 2016-04-22 MED ORDER — MORPHINE SULFATE (PF) 4 MG/ML IV SOLN
4.0000 mg | Freq: Once | INTRAVENOUS | Status: AC
  Administered 2016-04-22: 4 mg via INTRAVENOUS
  Filled 2016-04-22: qty 1

## 2016-04-22 MED ORDER — NITROGLYCERIN 2 % TD OINT
1.0000 [in_us] | TOPICAL_OINTMENT | Freq: Once | TRANSDERMAL | Status: AC
Start: 2016-04-22 — End: 2016-04-22
  Administered 2016-04-22: 1 [in_us] via TOPICAL
  Filled 2016-04-22: qty 1

## 2016-04-22 MED ORDER — HEPARIN (PORCINE) IN NACL 100-0.45 UNIT/ML-% IJ SOLN
900.0000 [IU]/h | INTRAMUSCULAR | Status: DC
  Administered 2016-04-22: 750 [IU]/h via INTRAVENOUS
  Filled 2016-04-22: qty 250

## 2016-04-22 MED ORDER — METOPROLOL TARTRATE 5 MG/5ML IV SOLN
5.0000 mg | Freq: Once | INTRAVENOUS | Status: AC
  Administered 2016-04-22: 5 mg via INTRAVENOUS
  Filled 2016-04-22 (×2): qty 5

## 2016-04-22 MED ORDER — HEPARIN BOLUS VIA INFUSION
3000.0000 [IU] | Freq: Once | INTRAVENOUS | Status: AC
  Administered 2016-04-22: 3000 [IU] via INTRAVENOUS

## 2016-04-22 MED ORDER — MORPHINE SULFATE (PF) 2 MG/ML IV SOLN
2.0000 mg | INTRAVENOUS | Status: DC | PRN
  Filled 2016-04-22: qty 1

## 2016-04-22 NOTE — ED Triage Notes (Signed)
Pt with history of mi and cabg in the past, states he had onset of mid sternal chest pain about an hour ago that he states is similar to the pain he had with his previous mi.  Pt took 2 of his own nitro sl at home without relief.

## 2016-04-22 NOTE — ED Notes (Signed)
ED Provider at bedside. 

## 2016-04-22 NOTE — ED Provider Notes (Signed)
[facility] DEPT Provider Note   CSN: [id number] Arrival date & time: 04/22/16  1939     History   Chief Complaint Chief Complaint  Patient presents with  . Chest Pain    HPI AREND BAHL is a 81 y.o. male.    Patient states he was working on some Lot[employer] and came inside and around 6:00 started having pain in his neck is chest and his arms. Patient has a history of coronary artery disease with bypass surgery and 2 stents   The history is provided by the patient. No language interpreter was used.  Chest Pain   This is a new problem. The current episode started 1 to 2 hours ago. The problem occurs constantly. The problem has not changed since onset.The pain is associated with exertion. The pain is present in the substernal region. The pain is at a severity of 6/10. The pain is moderate. The quality of the pain is described as exertional. The pain radiates to the right neck. The symptoms are aggravated by exertion. Pertinent negatives include no abdominal pain, no back pain, no cough and no headaches. He has tried nothing for the symptoms. The treatment provided mild relief. Risk factors include male gender.  Pertinent negatives for past medical history include no aneurysm and no seizures.    Past Medical History:  Diagnosis Date  . Carotid artery disease ([place])    70-26% LICA and 3-78% RICA - December 2013  . Coronary atherosclerosis of native coronary artery    Multivessel status post CABG, DES x 2 SVG to OM with staged DES SVG to RCA November 2015, DES to SVG to PDA 05/2015  . Essential hypertension, benign   . Gout   . HOH (hard of hearing)   . Sinus bradycardia    Asymptomatic    Patient Active Problem List   Diagnosis Date Noted  . Syncope and collapse   . Mucosal abnormality of duodenum   . Blood loss anemia   . Coronary artery disease involving coronary bypass graft of native heart without angina pectoris   . IDA (iron deficiency anemia)   . Syncope 12/23/2015   . Symptomatic anemia 12/23/2015  . NSTEMI (non-ST elevated myocardial infarction) ([place])   . Unstable angina ([place]) 05/18/2015  . Angina at rest [facility]) 05/18/2015  . Abdominal pain, epigastric 04/14/2015  . Left sided abdominal pain 04/14/2015  . Melena 02/27/2015  . CAD (coronary artery disease) of artery bypass graft 05/17/2014  . Prediabetes 04/23/2014  . CKD (chronic kidney disease) stage 3, GFR 30-59 ml/min 04/23/2014  . Anemia 04/23/2014  . HOH (hard of hearing)   . Bradycardia 04/19/2012  . Carotid artery disease without cerebral infarction ([place]) 04/07/2011  . Mixed hyperlipidemia 03/05/2009  . Essential hypertension, benign 03/05/2009  . CAD, NATIVE VESSEL 03/05/2009    Past Surgical History:  Procedure Laterality Date  . APPENDECTOMY     Ruptured  . CARDIAC CATHETERIZATION N/A 05/19/2015   Procedure: Left Heart Cath and Cors/Grafts Angiography;  Surgeon: Lorretta Harp, MD;  Location: [facility];  Service: Cardiovascular;  Laterality: N/A;  . CARDIAC CATHETERIZATION N/A 05/19/2015   Procedure: Coronary Stent Intervention;  Surgeon: Lorretta Harp, MD;  Location: [facility];  Service: Cardiovascular;  Laterality: N/A;  . CATARACT EXTRACTION W/PHACO Right 11/13/2012   Procedure: CATARACT EXTRACTION PHACO AND INTRAOCULAR LENS PLACEMENT (IOC);  Surgeon: Tonny Branch, MD;  Location: [facility];  Service: Ophthalmology;  Laterality: Right;  CDE:12.75  . CATARACT  EXTRACTION W/PHACO Left 01/11/2013   Procedure: CATARACT EXTRACTION PHACO AND INTRAOCULAR LENS PLACEMENT (IOC);  Surgeon: Tonny Branch, MD;  Location: [facility];  Service: Ophthalmology;  Laterality: Left;  CDE: 21.13  . COLONOSCOPY  09/2013   Dr. Burke Keels: colitis descending colon and sigmoid colon.Bx ?Cdiff vs ischemic.  Marland Kitchen CORONARY ARTERY BYPASS GRAFT  11/09/1999   LIMA to LAD, SVG to diagonal, SVG to OM1 and OM2, SVG to PDA  . ENTEROSCOPY N/A 12/26/2015   Procedure: ENTEROSCOPY;  Surgeon: Daneil Dolin, MD;   [facility];  Service: Endoscopy;  Laterality: N/A;  . ESOPHAGOGASTRODUODENOSCOPY N/A 05/20/2015   Procedure: ESOPHAGOGASTRODUODENOSCOPY (EGD);  Surgeon: Wilford Corner, MD;  Location: [facility] ENDOSCOPY;  Service: Endoscopy;  Laterality: N/A;  . ESOPHAGOGASTRODUODENOSCOPY N/A 12/26/2015   Procedure: ESOPHAGOGASTRODUODENOSCOPY (EGD);  Surgeon: Daneil Dolin, MD;  [facility];  Service: Endoscopy;  Laterality: N/A;  . GIVENS CAPSULE STUDY N/A 12/24/2015   Procedure: GIVENS CAPSULE STUDY;  Surgeon: Rogene Houston, MD;  [facility];  Service: Endoscopy;  Laterality: N/A;  . LEFT HEART CATHETERIZATION WITH CORONARY ANGIOGRAM N/A 04/22/2014   Procedure: LEFT HEART CATHETERIZATION WITH CORONARY ANGIOGRAM;  Surgeon: Burnell Blanks, MD;  Location: [facility] CATH LAB;  Service: Cardiovascular;  Laterality: N/A;  . PERCUTANEOUS CORONARY STENT INTERVENTION (PCI-S) N/A 04/25/2014   Procedure: PERCUTANEOUS CORONARY STENT INTERVENTION (PCI-S);  Surgeon: Peter M Martinique, MD;  Location: [facility] CATH LAB;  Service: Cardiovascular;  Laterality: N/A;       Home Medications    Prior to Admission medications   Medication Sig Start Date End Date Taking? Authorizing Provider  albuterol (PROVENTIL HFA;VENTOLIN HFA) 108 (90 Base) MCG/ACT inhaler Inhale 1-2 puffs into the lungs every 6 (six) hours as needed for wheezing or shortness of breath.   Yes Historical Provider, MD  aspirin EC 81 MG tablet Take 1 tablet (81 mg total) by mouth daily. 12/26/15  Yes Shanker Kristeen Mans, MD  atorvastatin (LIPITOR) 10 MG tablet Take 1 tablet (10 mg total) by mouth daily. 11/27/15  Yes Satira Sark, MD  COLCRYS 0.6 MG tablet Take 1 tablet by mouth Once daily as needed (for gout).  03/05/12  Yes Historical Provider, MD  cyclobenzaprine (FLEXERIL) 10 MG tablet Take 10 mg by mouth at bedtime.   Yes Historical Provider, MD  esomeprazole (NEXIUM) 40 MG capsule Take 1 capsule (40 mg total) by mouth daily at 12  noon. 12/26/15  Yes Shanker Kristeen Mans, MD  ferrous sulfate 325 (65 FE) MG tablet Take 1 tablet (325 mg total) by mouth 2 (two) times daily with a meal. 12/26/15  Yes Shanker Kristeen Mans, MD  fluticasone (FLOVENT HFA) 44 MCG/ACT inhaler Inhale 2 puffs into the lungs 2 (two) times daily.   Yes Historical Provider, MD  isosorbide mononitrate (IMDUR) 60 MG 24 hr tablet Take 0.5 tablets (30 mg total) by mouth daily. 08/19/15  Yes Satira Sark, MD  loratadine (CLARITIN) 10 MG tablet Take 10 mg by mouth at bedtime.   Yes Historical Provider, MD  metoprolol tartrate (LOPRESSOR) 25 MG tablet Take 0.5 tablets (12.5 mg total) by mouth 2 (two) times daily. 12/26/15  Yes Shanker Kristeen Mans, MD  nitroGLYCERIN (NITROSTAT) 0.4 MG SL tablet Place 1 tablet (0.4 mg total) under the tongue every 5 (five) minutes x 3 doses as needed. If no relief after 3rd dose, proceed to the ED for an evalutation 06/10/15  Yes Satira Sark, MD  tamsulosin (FLOMAX) 0.4 MG  CAPS capsule Take 0.4 mg by mouth at bedtime.   Yes Historical Provider, MD    Family History Family History  Problem Relation Age of Onset  . Cancer Father     colon, age 9s    Social History Social History  Substance Use Topics  . Smoking status: Former Smoker    Packs/day: 0.50    Years: 50.00    Types: Cigarettes    Start date: 01/08/1947    Quit date: 06/08/1999  . Smokeless tobacco: Never Used  . Alcohol use No     Allergies   Protonix [pantoprazole sodium]   Review of Systems Review of Systems  Constitutional: Negative for appetite change and fatigue.  HENT: Negative for congestion, ear discharge and sinus pressure.   Eyes: Negative for discharge.  Respiratory: Negative for cough.   Cardiovascular: Positive for chest pain.  Gastrointestinal: Negative for abdominal pain and diarrhea.  Genitourinary: Negative for frequency and hematuria.  Musculoskeletal: Negative for back pain.  Skin: Negative for rash.  Neurological: Negative for  seizures and headaches.  Psychiatric/Behavioral: Negative for hallucinations.     Physical Exam Updated Vital Signs BP (!) 175/111   Pulse 82   Temp 97.8 F (36.6 C) (Oral)   Resp 15   Ht 5' 7.5" (1.715 m)   Wt 142 lb (64.4 kg)   SpO2 95%   BMI 21.91 kg/m   Physical Exam  Constitutional: He is oriented to person, place, and time. He appears well-developed.  HENT:  Head: Normocephalic.  Eyes: Conjunctivae and EOM are normal. No scleral icterus.  Neck: Neck supple. No thyromegaly present.  Cardiovascular: Normal rate and regular rhythm.  Exam reveals no gallop and no friction rub.   No murmur heard. Pulmonary/Chest: No stridor. He has no wheezes. He has no rales. He exhibits no tenderness.  Abdominal: He exhibits no distension. There is no tenderness. There is no rebound.  Musculoskeletal: Normal range of motion. He exhibits no edema.  Lymphadenopathy:    He has no cervical adenopathy.  Neurological: He is oriented to person, place, and time. He exhibits normal muscle tone. Coordination normal.  Skin: No rash noted. No erythema.  Psychiatric: He has a normal mood and affect. His behavior is normal.     ED Treatments / Results  Labs (all labs ordered are listed, but only abnormal results are displayed) Labs Reviewed  COMPREHENSIVE METABOLIC PANEL - Abnormal; Notable for the following:       Result Value   Glucose, Bld 176 (*)    BUN 33 (*)    Creatinine, Ser 1.83 (*)    GFR calc non Af Amer 33 (*)    GFR calc Af Amer 39 (*)    All other components within normal limits  CBC WITH DIFFERENTIAL/PLATELET  Randolm Idol, ED    EKG  EKG Interpretation  Date/Time:  Thursday April 22 2016 19:45:22 EST Ventricular Rate:  85 PR Interval:    QRS Duration: 129 QT Interval:  361 QTC Calculation: 430 R Axis:   73 Text Interpretation:  Sinus rhythm Atrial premature complexes in couplets Nonspecific intraventricular conduction delay Anteroseptal infarct, age  indeterminate Probable RV involvement, suggest recording right precordial leads Confirmed by Malicia Blasdel  MD, Broadus John [phone]) on 04/22/2016 7:49:15 PM       Radiology Dg Chest Portable 1 View  Result Date: 04/22/2016 CLINICAL DATA:  Acute onset of midsternal chest pain, with pain in both arms. Initial encounter. EXAM: PORTABLE CHEST 1 VIEW COMPARISON:  Chest radiograph performed  12/23/2015 FINDINGS: The lungs are well-aerated. Mild vascular congestion is noted. There is no evidence of focal opacification, pleural effusion or pneumothorax. The cardiomediastinal silhouette is borderline normal in size. The patient is status post median sternotomy. No acute osseous abnormalities are seen. IMPRESSION: Mild vascular congestion noted.  Lungs remain grossly clear. Electronically Signed   By: Garald Balding M.D.   On: 04/22/2016 20:24    Procedures Procedures (including critical care time)  Medications Ordered in ED Medications  nitroGLYCERIN (NITROGLYN) 2 % ointment 1 inch (not administered)  metoprolol (LOPRESSOR) injection 5 mg (not administered)  morphine 2 MG/ML injection 2 mg (2 mg Intravenous Given 04/22/16 2009)  nitroGLYCERIN (NITROSTAT) SL tablet 0.4 mg (0.4 mg Sublingual Given 04/22/16 2052)     Initial Impression / Assessment and Plan / ED Course  I have reviewed the triage vital signs and the nursing notes.  Pertinent labs & imaging results that were available during my care of the patient were reviewed by me and considered in my medical decision making (see chart for details).  Clinical Course     EKG shows some ST depression troponin was normal. Patient's symptoms improved some with nitroglycerin and morphine. I spoke with cardiology who felt like patient could be admitted to [facility] and have repeat troponins  Final Clinical Impressions(s) / ED Diagnoses   Final diagnoses:  Unstable angina pectoris [facility])    New Prescriptions New Prescriptions   No medications on  file     Milton Ferguson, MD 04/22/16 2101

## 2016-04-22 NOTE — ED Notes (Signed)
Patient reports of pain relief after first nitro.

## 2016-04-22 NOTE — ED Notes (Signed)
Reports of pain relief post 2nd nitro.

## 2016-04-22 NOTE — H&P (Signed)
History and Physical    ARREN LAMINACK MOQ:[id number] DOB: Oct 27, 1934 DOA: 04/22/2016  PCP: Curlene Labrum, MD  Patient coming from: Home.   Chief Complaint:  Substernal chest pain at 8/10, with bilateral arm radiation.   HPI: Billy [name] is an 81 y.o. male with hx of known CAD, s/p CABG 16 years ago, prior MI, s/p subsequent stent placement, Cathed for his NSTEMI 05/2015 with DES placed for Cx obtuse marginal vein graft followed by RCA, hx of HLD, HTN, CKD 3, presented to the ER with substernal chest pain at rest, and bilateral arm and neck pain.  EKG showed new ST depressions on the precordial leads, and istat troponin was negative. His Cr was found to be 1.8, and Hb was 13g per dL.  He had duodenitis by EGD during last admission, and his Brillinta was discontinued, and he was maintained on ASA.  In the ER, his BP was elevated to 180, HR 80, and he was having persistent chest pain.  Cardiology was consulted by EDP, recommended admission at [facility]  I started him on IV NTG, gave IV lopressor x 2, Lipitor at 80mg  x 1, and Morphine IV.  Currently, he is about pain free.  ED Course:  See above.  Rewiew of Systems:  Constitutional: Negative for malaise, fever and chills. No significant weight loss or weight gain Eyes: Negative for eye pain, redness and discharge, diplopia, visual changes, or flashes of light. ENMT: Negative for ear pain, hoarseness, nasal congestion, sinus pressure and sore throat. No headaches; tinnitus, drooling, or problem swallowing. Cardiovascular: Negative for palpitations, diaphoresis, dyspnea and peripheral edema. ; No orthopnea, PND Respiratory: Negative for cough, hemoptysis, wheezing and stridor. No pleuritic chestpain. Gastrointestinal: Negative for diarrhea, constipation,  melena, blood in stool, hematemesis, jaundice and rectal bleeding.    Genitourinary: Negative for frequency, dysuria, incontinence,flank pain and hematuria; Musculoskeletal: Negative for back pain  and neck pain. Negative for swelling and trauma.;  Skin: . Negative for pruritus, rash, abrasions, bruising and skin lesion.; ulcerations Neuro: Negative for headache, lightheadedness and neck stiffness. Negative for weakness, altered level of consciousness , altered mental status, extremity weakness, burning feet, involuntary movement, seizure and syncope.  Psych: negative for anxiety, depression, insomnia, tearfulness, panic attacks, hallucinations, paranoia, suicidal or homicidal ideation    Past Medical History:  Diagnosis Date  . Carotid artery disease ([place])    35-46% LICA and 5-68% RICA - December 2013  . Coronary atherosclerosis of native coronary artery    Multivessel status post CABG, DES x 2 SVG to OM with staged DES SVG to RCA November 2015, DES to SVG to PDA 05/2015  . Essential hypertension, benign   . Gout   . HOH (hard of hearing)   . Sinus bradycardia    Asymptomatic    Rewiew of Systems:  Constitutional: Negative for malaise, fever and chills. No significant weight loss or weight gain Eyes: Negative for eye pain, redness and discharge, diplopia, visual changes, or flashes of light. ENMT: Negative for ear pain, hoarseness, nasal congestion, sinus pressure and sore throat. No headaches; tinnitus, drooling, or problem swallowing. Cardiovascular: Negative for chest pain, palpitations, diaphoresis, dyspnea and peripheral edema. ; No orthopnea, PND Respiratory: Negative for cough, hemoptysis, wheezing and stridor. No pleuritic chestpain. Gastrointestinal: Negative for nausea, vomiting, diarrhea, constipation, abdominal pain, melena, blood in stool, hematemesis, jaundice and rectal bleeding.    Genitourinary: Negative for frequency, dysuria, incontinence,flank pain and hematuria; Musculoskeletal: Negative for back pain and neck pain. Negative for swelling and  trauma.;  Skin: . Negative for pruritus, rash, abrasions, bruising and skin lesion.; ulcerations Neuro: Negative for  headache, lightheadedness and neck stiffness. Negative for weakness, altered level of consciousness , altered mental status, extremity weakness, burning feet, involuntary movement, seizure and syncope.  Psych: negative for anxiety, depression, insomnia, tearfulness, panic attacks, hallucinations, paranoia, suicidal or homicidal ideation   Past Surgical History:  Procedure Laterality Date  . APPENDECTOMY     Ruptured  . CARDIAC CATHETERIZATION N/A 05/19/2015   Procedure: Left Heart Cath and Cors/Grafts Angiography;  Surgeon: Lorretta Harp, MD;  Location: [facility];  Service: Cardiovascular;  Laterality: N/A;  . CARDIAC CATHETERIZATION N/A 05/19/2015   Procedure: Coronary Stent Intervention;  Surgeon: Lorretta Harp, MD;  Location: [facility] [facility];  Service: Cardiovascular;  Laterality: N/A;  . CATARACT EXTRACTION W/PHACO Right 11/13/2012   Procedure: CATARACT EXTRACTION PHACO AND INTRAOCULAR LENS PLACEMENT (IOC);  Surgeon: Tonny Branch, MD;  Location: [facility];  Service: Ophthalmology;  Laterality: Right;  CDE:12.75  . CATARACT EXTRACTION W/PHACO Left 01/11/2013   Procedure: CATARACT EXTRACTION PHACO AND INTRAOCULAR LENS PLACEMENT (IOC);  Surgeon: Tonny Branch, MD;  Location: [facility];  Service: Ophthalmology;  Laterality: Left;  CDE: 21.13  . COLONOSCOPY  09/2013   Dr. Burke Keels: colitis descending colon and sigmoid colon.Bx ?Cdiff vs ischemic.  Marland Kitchen CORONARY ARTERY BYPASS GRAFT  11/09/1999   LIMA to LAD, SVG to diagonal, SVG to OM1 and OM2, SVG to PDA  . ENTEROSCOPY N/A 12/26/2015   Procedure: ENTEROSCOPY;  Surgeon: Daneil Dolin, MD;  Location: [facility];  Service: Endoscopy;  Laterality: N/A;  . ESOPHAGOGASTRODUODENOSCOPY N/A 05/20/2015   Procedure: ESOPHAGOGASTRODUODENOSCOPY (EGD);  Surgeon: Wilford Corner, MD;  Location: [facility];  Service: Endoscopy;  Laterality: N/A;  . ESOPHAGOGASTRODUODENOSCOPY N/A 12/26/2015   Procedure: ESOPHAGOGASTRODUODENOSCOPY (EGD);  Surgeon: Daneil Dolin, MD;  [facility];  Service: Endoscopy;  Laterality: N/A;  . GIVENS CAPSULE STUDY N/A 12/24/2015   Procedure: GIVENS CAPSULE STUDY;  Surgeon: Rogene Houston, MD;  Location: [facility];  Service: Endoscopy;  Laterality: N/A;  . LEFT HEART CATHETERIZATION WITH CORONARY ANGIOGRAM N/A 04/22/2014   Procedure: LEFT HEART CATHETERIZATION WITH CORONARY ANGIOGRAM;  Surgeon: Burnell Blanks, MD;  Location: [facility] CATH LAB;  Service: Cardiovascular;  Laterality: N/A;  . PERCUTANEOUS CORONARY STENT INTERVENTION (PCI-S) N/A 04/25/2014   Procedure: PERCUTANEOUS CORONARY STENT INTERVENTION (PCI-S);  Surgeon: Teresea Donley M Martinique, MD;  Location: [facility] CATH LAB;  Service: Cardiovascular;  Laterality: N/A;     reports that he quit smoking about 16 years ago. His smoking use included Cigarettes. He started smoking about 69 years ago. He has a 25.00 pack-year smoking history. He has never used smokeless tobacco. He reports that he does not drink alcohol or use drugs.  Allergies  Allergen Reactions  . Protonix [Pantoprazole Sodium] Itching and Rash    Family History  Problem Relation Age of Onset  . Cancer Father     colon, age 43s     Prior to Admission medications   Medication Sig Start Date End Date Taking? Authorizing Provider  albuterol (PROVENTIL HFA;VENTOLIN HFA) 108 (90 Base) MCG/ACT inhaler Inhale 1-2 puffs into the lungs every 6 (six) hours as needed for wheezing or shortness of breath.   Yes Historical Provider, MD  aspirin EC 81 MG tablet Take 1 tablet (81 mg total) by mouth daily. 12/26/15  Yes Shanker Kristeen Mans, MD  atorvastatin (LIPITOR) 10 MG tablet Take 1 tablet (  10 mg total) by mouth daily. 11/27/15  Yes Satira Sark, MD  COLCRYS 0.6 MG tablet Take 1 tablet by mouth Once daily as needed (for gout).  03/05/12  Yes Historical Provider, MD  cyclobenzaprine (FLEXERIL) 10 MG tablet Take 10 mg by mouth at bedtime.   Yes Historical Provider, MD  esomeprazole (NEXIUM) 40 MG  capsule Take 1 capsule (40 mg total) by mouth daily at 12 noon. 12/26/15  Yes Shanker Kristeen Mans, MD  ferrous sulfate 325 (65 FE) MG tablet Take 1 tablet (325 mg total) by mouth 2 (two) times daily with a meal. 12/26/15  Yes Shanker Kristeen Mans, MD  fluticasone (FLOVENT HFA) 44 MCG/ACT inhaler Inhale 2 puffs into the lungs 2 (two) times daily.   Yes Historical Provider, MD  isosorbide mononitrate (IMDUR) 60 MG 24 hr tablet Take 0.5 tablets (30 mg total) by mouth daily. 08/19/15  Yes Satira Sark, MD  loratadine (CLARITIN) 10 MG tablet Take 10 mg by mouth at bedtime.   Yes Historical Provider, MD  metoprolol tartrate (LOPRESSOR) 25 MG tablet Take 0.5 tablets (12.5 mg total) by mouth 2 (two) times daily. 12/26/15  Yes Shanker Kristeen Mans, MD  nitroGLYCERIN (NITROSTAT) 0.4 MG SL tablet Place 1 tablet (0.4 mg total) under the tongue every 5 (five) minutes x 3 doses as needed. If no relief after 3rd dose, proceed to the ED for an evalutation 06/10/15  Yes Satira Sark, MD  tamsulosin (FLOMAX) 0.4 MG CAPS capsule Take 0.4 mg by mouth at bedtime.   Yes Historical Provider, MD    Physical Exam: Vitals:   04/22/16 2055 04/22/16 2100 04/22/16 2130 04/22/16 2159  BP: 172/81 152/68 180/98 181/94  Pulse: 79 80 81 75  Resp: 18 19 18    Temp:      TempSrc:      SpO2: 95% 93% 96%   Weight:      Height:          Constitutional: NAD, calm, comfortable Vitals:   04/22/16 2055 04/22/16 2100 04/22/16 2130 04/22/16 2159  BP: 172/81 152/68 180/98 181/94  Pulse: 79 80 81 75  Resp: 18 19 18    Temp:      TempSrc:      SpO2: 95% 93% 96%   Weight:      Height:       Eyes: PERRL, lids and conjunctivae normal ENMT: Mucous membranes are moist. Posterior pharynx clear of any exudate or lesions.Normal dentition.  Neck: normal, supple, no masses, no thyromegaly Respiratory: clear to auscultation bilaterally, no wheezing, no crackles. Normal respiratory effort. No accessory muscle use.  Cardiovascular: Regular  rate and rhythm, no murmurs / rubs / gallops. No extremity edema. 2+ pedal pulses. No carotid bruits.  Abdomen: no tenderness, no masses palpated. No hepatosplenomegaly. Bowel sounds positive.  Musculoskeletal: no clubbing / cyanosis. No joint deformity upper and lower extremities. Good ROM, no contractures. Normal muscle tone.  Skin: no rashes, lesions, ulcers. No induration Neurologic: CN 2-12 grossly intact. Sensation intact, DTR normal. Strength 5/5 in all 4.  Psychiatric: Normal judgment and insight. Alert and oriented x 3. Normal mood.     Labs on Admission: I have personally reviewed following labs and imaging studies  CBC:  Recent Labs Lab 04/22/16 1950  WBC 7.0  NEUTROABS 5.1  HGB 13.7  HCT 41.0  MCV 87.4  PLT 073   Basic Metabolic Panel:  Recent Labs Lab 04/22/16 1950  NA 137  K 3.7  CL 103  CO2  27  GLUCOSE 176*  BUN 33*  CREATININE 1.83*  CALCIUM 9.6   GFR: Estimated Creatinine Clearance: 29.8 mL/min (by C-G formula based on SCr of 1.83 mg/dL (H)). Liver Function Tests:  Recent Labs Lab 04/22/16 1950  AST 26  ALT 35  ALKPHOS 64  BILITOT 0.6  PROT 7.6  ALBUMIN 4.4   Urine analysis:    Component Value Date/Time   COLORURINE STRAW (A) 12/23/2015 1230   APPEARANCEUR CLEAR 12/23/2015 1230   LABSPEC 1.020 12/23/2015 1230   PHURINE 6.0 12/23/2015 1230   GLUCOSEU NEGATIVE 12/23/2015 1230   HGBUR NEGATIVE 12/23/2015 1230   BILIRUBINUR NEGATIVE 12/23/2015 1230   KETONESUR NEGATIVE 12/23/2015 1230   PROTEINUR NEGATIVE 12/23/2015 1230   NITRITE NEGATIVE 12/23/2015 1230   LEUKOCYTESUR NEGATIVE 12/23/2015 1230   Radiological Exams on Admission: Dg Chest Portable 1 View  Result Date: 04/22/2016 CLINICAL DATA:  Acute onset of midsternal chest pain, with pain in both arms. Initial encounter. EXAM: PORTABLE CHEST 1 VIEW COMPARISON:  Chest radiograph performed 12/23/2015 FINDINGS: The lungs are well-aerated. Mild vascular congestion is noted. There is no  evidence of focal opacification, pleural effusion or pneumothorax. The cardiomediastinal silhouette is borderline normal in size. The patient is status post median sternotomy. No acute osseous abnormalities are seen. IMPRESSION: Mild vascular congestion noted.  Lungs remain grossly clear. Electronically Signed   By: Garald Balding M.D.   On: 04/22/2016 20:24    EKG: Independently reviewed.   Assessment/Plan Principal Problem:   Chest pain at rest Active Problems:   CAD, NATIVE VESSEL   CKD (chronic kidney disease) stage 3, GFR 30-59 ml/min   CAD (coronary artery disease) of artery bypass graft   Mucosal abnormality of duodenum   Chest pain    PLAN:   Chest pain:  This is very suspicious for unstable angina.  Will continue with IV Nitroglycerin drip.  IV lopressor and oral Lopressor have been given with better control of his rate pressure products.  Continue with morphine PRN.  Though he had upper GI bleed, it was from AVM and duodenitis, and he his not actively bleeding, with normal Hb, so after discussing R and B with cardiology and patient and his daughter, we will heparinize him fully.  His CKD discourages cath, but I will transfer him to [facility], as I suspect he may require interventional cardiology.  Will give Lipitor at 80mg  tonight.  I have spoken with Dr Koleen Nimrod, who will see him in consultation.  Will admit him to SDU under hospitalist service.  Thank you and Good Day.     DVT prophylaxis: IV Hepain. Code Status: FULL CODE.  Family Communication: wife and daughter,  Disposition Plan:  TBD.  Transferring to [facility].  Consults called: Cardiology Dr Koleen Nimrod.  Admission status: Inpatient.    Shaylon Aden MD FACP. [facility]  If 7PM-7AM, please contact night-coverage [email or web address] Password [email or web address]  04/22/2016, 10:06 PM

## 2016-04-22 NOTE — Progress Notes (Signed)
ANTICOAGULATION CONSULT NOTE - Initial Consult  Pharmacy Consult for heparin Indication: chest pain/ACS  Allergies  Allergen Reactions  . Protonix [Pantoprazole Sodium] Itching and Rash    Patient Measurements: Height: 5' 7.5" (171.5 cm) Weight: 142 lb (64.4 kg) IBW/kg (Calculated) : 67.25 Heparin Dosing Weight: 64.4 kg  Vital Signs: Temp: 97.8 F (36.6 C) (11/16 1950) Temp Source: Oral (11/16 1950) BP: 177/86 (11/16 2230) Pulse Rate: 79 (11/16 2230)  Labs:  Recent Labs  04/22/16 1950  HGB 13.7  HCT 41.0  PLT 283  CREATININE 1.83*    Estimated Creatinine Clearance: 29.8 mL/min (by C-G formula based on SCr of 1.83 mg/dL (H)).   Medical History: Past Medical History:  Diagnosis Date  . Carotid artery disease ([place])    43-15% LICA and 4-00% RICA - December 2013  . Coronary atherosclerosis of native coronary artery    Multivessel status post CABG, DES x 2 SVG to OM with staged DES SVG to RCA November 2015, DES to SVG to PDA 05/2015  . Essential hypertension, benign   . Gout   . HOH (hard of hearing)   . Sinus bradycardia    Asymptomatic    Medications:   (Not in a hospital admission) Scheduled:  . atorvastatin  80 mg Oral q1800  . heparin  3,000 Units Intravenous Once  . metoprolol tartrate  25 mg Oral BID    Assessment: 81 yo male ED patient, substernal chest pain Pharmacy consult for heparin Labs reviewed PTA medications reviewed  Goal of Therapy:  Heparin level 0.3-0.7 units/ml Monitor platelets by anticoagulation protocol: Yes   Plan:  Give 3000 units bolus x 1 Start heparin infusion at 750 units/hr Check anti-Xa level in 8 hours and daily while on heparin Continue to monitor H&H and platelets  Abner Greenspan, Jeweldean Drohan Bennett 04/22/2016,10:48 PM

## 2016-04-22 NOTE — ED Notes (Signed)
Dr Le at bedside.  

## 2016-04-23 ENCOUNTER — Encounter (HOSPITAL_COMMUNITY)
Admission: EM | Disposition: A | Discharge status: Home / Self Care | Payer: Self-pay | Source: Home / Self Care | Attending: Family Medicine

## 2016-04-23 DIAGNOSIS — I10 Essential (primary) hypertension: Secondary | ICD-10-CM

## 2016-04-23 DIAGNOSIS — I25700 Atherosclerosis of coronary artery bypass graft(s), unspecified, with unstable angina pectoris: Secondary | ICD-10-CM

## 2016-04-23 DIAGNOSIS — I2510 Atherosclerotic heart disease of native coronary artery without angina pectoris: Secondary | ICD-10-CM

## 2016-04-23 DIAGNOSIS — E782 Mixed hyperlipidemia: Secondary | ICD-10-CM

## 2016-04-23 DIAGNOSIS — T82867A Thrombosis of cardiac prosthetic devices, implants and grafts, initial encounter: Secondary | ICD-10-CM

## 2016-04-23 DIAGNOSIS — I200 Unstable angina: Secondary | ICD-10-CM

## 2016-04-23 DIAGNOSIS — I25810 Atherosclerosis of coronary artery bypass graft(s) without angina pectoris: Secondary | ICD-10-CM

## 2016-04-23 DIAGNOSIS — K319 Disease of stomach and duodenum, unspecified: Secondary | ICD-10-CM

## 2016-04-23 DIAGNOSIS — N183 Chronic kidney disease, stage 3 (moderate): Secondary | ICD-10-CM

## 2016-04-23 DIAGNOSIS — R079 Chest pain, unspecified: Secondary | ICD-10-CM

## 2016-04-23 DIAGNOSIS — I214 Non-ST elevation (NSTEMI) myocardial infarction: Secondary | ICD-10-CM

## 2016-04-23 HISTORY — PX: PERIPHERAL VASCULAR CATHETERIZATION: SHX172C

## 2016-04-23 HISTORY — PX: CARDIAC CATHETERIZATION: SHX172

## 2016-04-23 LAB — BASIC METABOLIC PANEL
Anion gap: 7 (ref 5–15)
BUN: 22 mg/dL — AB (ref 6–20)
CALCIUM: 9.4 mg/dL (ref 8.9–10.3)
CO2: 26 mmol/L (ref 22–32)
CREATININE: 1.14 mg/dL (ref 0.61–1.24)
Chloride: 108 mmol/L (ref 101–111)
GFR calc Af Amer: >60 mL/min (ref >60)
GFR, EST NON AFRICAN AMERICAN: 59 mL/min — AB (ref >60)
GLUCOSE: 112 mg/dL — AB (ref 65–99)
POTASSIUM: 3.8 mmol/L (ref 3.5–5.1)
SODIUM: 141 mmol/L (ref 135–145)

## 2016-04-23 LAB — CBC
HEMATOCRIT: 37.6 % — AB (ref 39.0–52.0)
HEMATOCRIT: 39.3 % (ref 39.0–52.0)
Hemoglobin: 12.6 g/dL — ABNORMAL LOW (ref 13.0–17.0)
Hemoglobin: 12.8 g/dL — ABNORMAL LOW (ref 13.0–17.0)
MCH: 28.7 pg (ref 26.0–34.0)
MCH: 28.1 pg (ref 26.0–34.0)
MCHC: 33.5 g/dL (ref 30.0–36.0)
MCHC: 32.6 g/dL (ref 30.0–36.0)
MCV: 85.6 fL (ref 78.0–100.0)
MCV: 86.2 fL (ref 78.0–100.0)
PLATELETS: 254 10*3/uL (ref 150–400)
PLATELETS: 265 10*3/uL (ref 150–400)
RBC: 4.39 MIL/uL (ref 4.22–5.81)
RBC: 4.56 MIL/uL (ref 4.22–5.81)
RDW: 14.3 % (ref 11.5–15.5)
RDW: 14.3 % (ref 11.5–15.5)
WBC: 7.2 10*3/uL (ref 4.0–10.5)
WBC: 7.5 10*3/uL (ref 4.0–10.5)

## 2016-04-23 LAB — LIPID PANEL
CHOLESTEROL: 121 mg/dL (ref 0–200)
HDL: 22 mg/dL — ABNORMAL LOW (ref >40)
LDL CALC: 76 mg/dL (ref 0–99)
TRIGLYCERIDES: 117 mg/dL (ref <150)
Total CHOL/HDL Ratio: 5.5 RATIO
VLDL: 23 mg/dL (ref 0–40)

## 2016-04-23 LAB — TROPONIN I
TROPONIN I: 29.29 ng/mL — AB (ref <0.03)
TROPONIN I: 51.57 ng/mL — AB (ref <0.03)
Troponin I: 5.04 ng/mL (ref <0.03)

## 2016-04-23 LAB — URINALYSIS, ROUTINE W REFLEX MICROSCOPIC
Bilirubin Urine: NEGATIVE
GLUCOSE, UA: NEGATIVE mg/dL
Hgb urine dipstick: NEGATIVE
Ketones, ur: NEGATIVE mg/dL
LEUKOCYTES UA: NEGATIVE
Nitrite: NEGATIVE
PROTEIN: NEGATIVE mg/dL
SPECIFIC GRAVITY, URINE: 1.007 (ref 1.005–1.030)
pH: 7 (ref 5.0–8.0)

## 2016-04-23 LAB — CREATININE, SERUM
Creatinine, Ser: 1.3 mg/dL — ABNORMAL HIGH (ref 0.61–1.24)
GFR calc non Af Amer: 51 mL/min — ABNORMAL LOW (ref >60)
GFR, EST AFRICAN AMERICAN: 59 mL/min — AB (ref >60)

## 2016-04-23 LAB — POCT ACTIVATED CLOTTING TIME: Activated Clotting Time: 417 seconds

## 2016-04-23 LAB — PROTIME-INR
INR: 1.13
Prothrombin Time: 14.5 seconds (ref 11.4–15.2)

## 2016-04-23 LAB — HEPARIN LEVEL (UNFRACTIONATED): HEPARIN UNFRACTIONATED: 0.22 [IU]/mL — AB (ref 0.30–0.70)

## 2016-04-23 LAB — MRSA PCR SCREENING: MRSA BY PCR: NEGATIVE

## 2016-04-23 LAB — TSH: TSH: 6.836 u[IU]/mL — AB (ref 0.350–4.500)

## 2016-04-23 SURGERY — LEFT HEART CATH AND CORS/GRAFTS ANGIOGRAPHY
Anesthesia: LOCAL

## 2016-04-23 MED ORDER — NITROGLYCERIN 1 MG/10 ML FOR IR/CATH LAB
INTRA_ARTERIAL | Status: AC
  Filled 2016-04-23: qty 10

## 2016-04-23 MED ORDER — ASPIRIN 81 MG PO CHEW
243.0000 mg | CHEWABLE_TABLET | Freq: Once | ORAL | Status: AC
  Administered 2016-04-23: 243 mg via ORAL
  Filled 2016-04-23: qty 3

## 2016-04-23 MED ORDER — HEPARIN (PORCINE) IN NACL 2-0.9 UNIT/ML-% IJ SOLN
INTRAMUSCULAR | Status: DC | PRN
  Administered 2016-04-23: 1500 mL via INTRA_ARTERIAL

## 2016-04-23 MED ORDER — ALBUTEROL SULFATE (2.5 MG/3ML) 0.083% IN NEBU: 3.0000 mL | INHALATION_SOLUTION | Freq: Four times a day (QID) | RESPIRATORY_TRACT | Status: DC | PRN

## 2016-04-23 MED ORDER — ALUM & MAG HYDROXIDE-SIMETH 200-200-20 MG/5ML PO SUSP
30.0000 mL | Freq: Four times a day (QID) | ORAL | Status: DC | PRN
  Administered 2016-04-23: 30 mL via ORAL
  Filled 2016-04-23: qty 30

## 2016-04-23 MED ORDER — LIDOCAINE HCL (PF) 1 % IJ SOLN
INTRAMUSCULAR | Status: AC
  Filled 2016-04-23: qty 30

## 2016-04-23 MED ORDER — SODIUM CHLORIDE 0.9 % IV SOLN: 250.0000 mL | INTRAVENOUS | Status: DC | PRN

## 2016-04-23 MED ORDER — SODIUM CHLORIDE 0.9% FLUSH: 3.0000 mL | INTRAVENOUS | Status: DC | PRN

## 2016-04-23 MED ORDER — VERAPAMIL HCL 2.5 MG/ML IV SOLN
INTRAVENOUS | Status: AC
  Filled 2016-04-23: qty 2

## 2016-04-23 MED ORDER — HEPARIN (PORCINE) IN NACL 2-0.9 UNIT/ML-% IJ SOLN
INTRAMUSCULAR | Status: AC
  Filled 2016-04-23: qty 1000

## 2016-04-23 MED ORDER — BUDESONIDE 0.25 MG/2ML IN SUSP
0.2500 mg | Freq: Two times a day (BID) | RESPIRATORY_TRACT | Status: DC
  Administered 2016-04-24: 0.25 mg via RESPIRATORY_TRACT
  Filled 2016-04-23 (×2): qty 2

## 2016-04-23 MED ORDER — SODIUM CHLORIDE 0.9 % WEIGHT BASED INFUSION: 3.0000 mL/kg/h | INTRAVENOUS | Status: AC

## 2016-04-23 MED ORDER — TAMSULOSIN HCL 0.4 MG PO CAPS
0.4000 mg | ORAL_CAPSULE | Freq: Every day | ORAL | Status: DC
  Administered 2016-04-23: 23:00:00 0.4 mg via ORAL
  Filled 2016-04-23: qty 1

## 2016-04-23 MED ORDER — OMEPRAZOLE 20 MG PO CPDR
40.0000 mg | DELAYED_RELEASE_CAPSULE | Freq: Every day | ORAL | Status: DC
  Administered 2016-04-23: 40 mg via ORAL
  Filled 2016-04-23 (×2): qty 2

## 2016-04-23 MED ORDER — VERAPAMIL HCL 2.5 MG/ML IV SOLN
INTRAVENOUS | Status: DC | PRN
  Administered 2016-04-23: 200 ug via INTRACORONARY

## 2016-04-23 MED ORDER — HEPARIN SODIUM (PORCINE) 1000 UNIT/ML IJ SOLN
INTRAMUSCULAR | Status: DC | PRN
  Administered 2016-04-23: 4000 [IU] via INTRAVENOUS

## 2016-04-23 MED ORDER — ASPIRIN 81 MG PO CHEW: 324.0000 mg | CHEWABLE_TABLET | ORAL | Status: DC

## 2016-04-23 MED ORDER — VERAPAMIL HCL 2.5 MG/ML IV SOLN
INTRAVENOUS | Status: DC | PRN
  Administered 2016-04-23: 10 mL via INTRA_ARTERIAL

## 2016-04-23 MED ORDER — CLOPIDOGREL BISULFATE 75 MG PO TABS
75.0000 mg | ORAL_TABLET | Freq: Every day | ORAL | Status: DC
  Administered 2016-04-24: 10:00:00 75 mg via ORAL
  Filled 2016-04-23: qty 1

## 2016-04-23 MED ORDER — IOPAMIDOL (ISOVUE-370) INJECTION 76%
INTRAVENOUS | Status: AC
  Filled 2016-04-23: qty 125

## 2016-04-23 MED ORDER — CLOPIDOGREL BISULFATE 300 MG PO TABS
ORAL_TABLET | ORAL | Status: DC | PRN
  Administered 2016-04-23: 300 mg via ORAL

## 2016-04-23 MED ORDER — HEPARIN BOLUS VIA INFUSION
1000.0000 [IU] | Freq: Once | INTRAVENOUS | Status: AC
  Administered 2016-04-23: 1000 [IU] via INTRAVENOUS
  Filled 2016-04-23: qty 1000

## 2016-04-23 MED ORDER — ATROPINE SULFATE 1 MG/10ML IJ SOSY
PREFILLED_SYRINGE | INTRAMUSCULAR | Status: AC
  Filled 2016-04-23: qty 10

## 2016-04-23 MED ORDER — BIVALIRUDIN BOLUS VIA INFUSION - CUPID
INTRAVENOUS | Status: DC | PRN
  Administered 2016-04-23: 48.75 mg via INTRAVENOUS

## 2016-04-23 MED ORDER — CLOPIDOGREL BISULFATE 300 MG PO TABS
ORAL_TABLET | ORAL | Status: AC
  Filled 2016-04-23: qty 1

## 2016-04-23 MED ORDER — SODIUM CHLORIDE 0.9 % IV SOLN
INTRAVENOUS | Status: DC | PRN
  Administered 2016-04-23 (×2): 1.75 mg/kg/h via INTRAVENOUS

## 2016-04-23 MED ORDER — IOPAMIDOL (ISOVUE-370) INJECTION 76%
INTRAVENOUS | Status: DC | PRN
  Administered 2016-04-23: 260 mL via INTRA_ARTERIAL

## 2016-04-23 MED ORDER — SODIUM CHLORIDE 0.9 % IV SOLN: INTRAVENOUS | Status: DC

## 2016-04-23 MED ORDER — FENTANYL CITRATE (PF) 100 MCG/2ML IJ SOLN
INTRAMUSCULAR | Status: DC | PRN
  Administered 2016-04-23 (×4): 25 ug via INTRAVENOUS

## 2016-04-23 MED ORDER — IOPAMIDOL (ISOVUE-370) INJECTION 76%
INTRAVENOUS | Status: AC
  Filled 2016-04-23: qty 100

## 2016-04-23 MED ORDER — ACETAMINOPHEN 325 MG PO TABS
650.0000 mg | ORAL_TABLET | ORAL | Status: DC | PRN
  Administered 2016-04-24: 10:00:00 650 mg via ORAL
  Filled 2016-04-23: qty 2

## 2016-04-23 MED ORDER — METOPROLOL TARTRATE 12.5 MG HALF TABLET: 12.5000 mg | ORAL_TABLET | Freq: Two times a day (BID) | ORAL | Status: DC

## 2016-04-23 MED ORDER — FENTANYL CITRATE (PF) 100 MCG/2ML IJ SOLN
INTRAMUSCULAR | Status: AC
  Filled 2016-04-23: qty 2

## 2016-04-23 MED ORDER — ONDANSETRON HCL 4 MG/2ML IJ SOLN: 4.0000 mg | Freq: Four times a day (QID) | INTRAMUSCULAR | Status: DC | PRN

## 2016-04-23 MED ORDER — TIROFIBAN HCL IN NACL 5-0.9 MG/100ML-% IV SOLN
0.1500 ug/kg/min | INTRAVENOUS | Status: DC
  Filled 2016-04-23: qty 100

## 2016-04-23 MED ORDER — ONDANSETRON HCL 4 MG PO TABS: 4.0000 mg | ORAL_TABLET | Freq: Four times a day (QID) | ORAL | Status: DC | PRN

## 2016-04-23 MED ORDER — MIDAZOLAM HCL 2 MG/2ML IJ SOLN
INTRAMUSCULAR | Status: AC
  Filled 2016-04-23: qty 2

## 2016-04-23 MED ORDER — MIDAZOLAM HCL 2 MG/2ML IJ SOLN
INTRAMUSCULAR | Status: DC | PRN
  Administered 2016-04-23: 2 mg via INTRAVENOUS
  Administered 2016-04-23: 1 mg via INTRAVENOUS

## 2016-04-23 MED ORDER — ASPIRIN EC 81 MG PO TBEC
81.0000 mg | DELAYED_RELEASE_TABLET | Freq: Every day | ORAL | Status: DC
  Administered 2016-04-23 – 2016-04-24 (×2): 81 mg via ORAL
  Filled 2016-04-23 (×2): qty 1

## 2016-04-23 MED ORDER — SODIUM CHLORIDE 0.9% FLUSH
3.0000 mL | Freq: Two times a day (BID) | INTRAVENOUS | Status: DC
  Administered 2016-04-23 – 2016-04-24 (×2): 3 mL via INTRAVENOUS

## 2016-04-23 MED ORDER — LIDOCAINE HCL (PF) 1 % IJ SOLN
INTRAMUSCULAR | Status: DC | PRN
  Administered 2016-04-23: 5 mL via INTRADERMAL
  Administered 2016-04-23: 25 mL via INTRADERMAL

## 2016-04-23 MED ORDER — BIVALIRUDIN 250 MG IV SOLR
INTRAVENOUS | Status: AC
  Filled 2016-04-23: qty 250

## 2016-04-23 MED ORDER — SODIUM CHLORIDE 0.9% FLUSH: 3.0000 mL | Freq: Two times a day (BID) | INTRAVENOUS | Status: DC

## 2016-04-23 MED ORDER — NITROGLYCERIN 1 MG/10 ML FOR IR/CATH LAB
INTRA_ARTERIAL | Status: DC | PRN
  Administered 2016-04-23 (×3): 200 ug via INTRACORONARY

## 2016-04-23 SURGICAL SUPPLY — 29 items
BALLN MOZEC 2.50X14 (BALLOONS) ×3
BALLN ~~LOC~~ MOZEC 3.0X18 (BALLOONS) ×3
BALLOON MOZEC 2.50X14 (BALLOONS) ×2 IMPLANT
BALLOON ~~LOC~~ MOZEC 3.0X18 (BALLOONS) ×2 IMPLANT
CATH EXTRAC PRONTO 5.5F 138CM (CATHETERS) ×3 IMPLANT
CATH INFINITI 5 FR IM (CATHETERS) ×3 IMPLANT
CATH INFINITI 5FR MULTPACK ANG (CATHETERS) ×3 IMPLANT
CATH OPTITORQUE TIG 4.0 5F (CATHETERS) IMPLANT
CATH VISTA GUIDE 6FR JR4 (CATHETERS) ×3 IMPLANT
COVER PRB 48X5XTLSCP FOLD TPE (BAG) ×2 IMPLANT
COVER PROBE 5X48 (BAG) ×1
DEVICE CONTINUOUS FLUSH (MISCELLANEOUS) ×3 IMPLANT
DEVICE RAD COMP TR BAND LRG (VASCULAR PRODUCTS) ×3 IMPLANT
GLIDESHEATH SLEND A-KIT 6F 22G (SHEATH) ×3 IMPLANT
GUIDE CATH RUNWAY 6FR AL 1 (CATHETERS) ×3 IMPLANT
GUIDEWIRE INQWIRE 1.5J.035X260 (WIRE) ×2 IMPLANT
INQWIRE 1.5J .035X260CM (WIRE) ×3
KIT ENCORE 26 ADVANTAGE (KITS) ×3 IMPLANT
KIT HEART LEFT (KITS) ×3 IMPLANT
PACK CARDIAC CATHETERIZATION (CUSTOM PROCEDURE TRAY) ×3 IMPLANT
SET INTRODUCER MICROPUNCT 5F (INTRODUCER) ×3 IMPLANT
SHEATH PINNACLE 5F 10CM (SHEATH) ×3 IMPLANT
SHEATH PINNACLE 6F 10CM (SHEATH) ×3 IMPLANT
STENT SYNERGY DES 3X28 (Permanent Stent) ×3 IMPLANT
STENT SYNERGY DES 3X38 (Permanent Stent) ×3 IMPLANT
TRANSDUCER W/STOPCOCK (MISCELLANEOUS) ×3 IMPLANT
TUBING CIL FLEX 10 FLL-RA (TUBING) ×3 IMPLANT
WIRE ASAHI PROWATER 180CM (WIRE) ×6 IMPLANT
WIRE HI TORQ VERSACORE-J 145CM (WIRE) ×6 IMPLANT

## 2016-04-23 NOTE — Interval H&P Note (Signed)
History and Physical Interval Note:  04/23/2016 4:08 PM  Lillard Anes Sibal  has presented today for surgery, with the diagnosis of non-ST elevation MI. The various methods of treatment have been discussed with the patient and family. After consideration of risks, benefits and other options for treatment, the patient has consented to  Procedure(s): Left Heart Cath and Coronary Angiography (N/A) with possible Percutaneous Coronary Intervention as a surgical intervention .  The patient's history has been reviewed, patient examined, no change in status, stable for surgery.  I have reviewed the patient's chart and labs.  Questions were answered to the patient's satisfaction.    Cath Lab Visit (complete for each Cath Lab visit)  Clinical Evaluation Leading to the Procedure:   ACS: Yes.    Non-ACS:    Anginal Classification: CCS IV  Anti-ischemic medical therapy: Minimal Therapy (1 class of medications)  Non-Invasive Test Results: No non-invasive testing performed  Prior CABG: Previous CABG   Glenetta Hew

## 2016-04-23 NOTE — ED Notes (Signed)
Report given to Delia Chimes on 3W

## 2016-04-23 NOTE — Progress Notes (Signed)
CRITICAL VALUE ALERT  Critical value received: Troponin 5.04  Date of notification: 04/23/2016  Time of notification:  8337  Critical value read back: yes  Nurse who received alert:  Santiago Glad  MD notified (1st page):  Hal Hope & Koleen Nimrod  Time of first page:  0450  MD notified (2nd page):  Time of second page:  Responding MD:  Shaaron Adler   Time MD responded:  [phone]

## 2016-04-23 NOTE — Progress Notes (Signed)
PROGRESS NOTE    Billy [name]  JJO:[id number] DOB: 17-May-1935 DOA: 04/22/2016 PCP: Curlene Labrum, MD    Brief Narrative:  Billy [name] is an 81 y.o. male with hx of known CAD, s/p CABG 16 years ago, prior MI, s/p subsequent stent placement, Cathed for his NSTEMI 05/2015 with DES placed for Cx obtuse marginal vein graft followed by RCA, hx of HLD, HTN, CKD 3, presented to the ER with substernal chest pain at rest, and bilateral arm and neck pain.  EKG showed new ST depressions on the precordial leads, and istat troponin was negative. His Cr was found to be 1.8, and Hb was 13g per dL.  He had duodenitis by EGD during last admission, and his Brillinta was discontinued, and he was maintained on ASA.  In the ER, his BP was elevated to 180, HR 80, and he was having persistent chest pain.  Cardiology was consulted by EDP, recommended admission at [facility].  He was started on IV NTG, gave IV lopressor x 2, Lipitor at 80mg  x 1, and Morphine IV.  Cardiology consulted and recommend cath given elevation of troponin.   Assessment & Plan:   Principal Problem:   NSTEMI (non-ST elevated myocardial infarction) ([place]) Active Problems:   Mixed hyperlipidemia   CAD, NATIVE VESSEL   CKD (chronic kidney disease) stage 3, GFR 30-59 ml/min   CAD (coronary artery disease) of artery bypass graft   Mucosal abnormality of duodenum   Chest pain at rest   Chest pain:   - continue with IV Nitroglycerin drip - Continue with morphine PRN - Cardiology consulted- plan for cath today - Cr improved this am - troponins significantly elevated - awaiting cath results   CKD Stage 3 - Cr improved - repeat BMP in am - continue to monitor   DVT prophylaxis: IV Hepain. Code Status: FULL CODE.  Family Communication: wife and daughter,  Disposition: pending cardiac workuped  Consultants:   Cardiology  Procedures:   None  Antimicrobials:   none    Subjective: Patient seen and evaluated.  Family has lots  of questions concerning patient's kidney disease and his heart.  Cardiology PA seeing patient at same time and voices patient will undergo cath today.   Objective: Vitals:   04/23/16 0741 04/23/16 0840 04/23/16 1011 04/23/16 1213  BP: (!) 152/81  (!) 167/79   Pulse: 62  66   Resp: 14  19   Temp:  97.9 F (36.6 C)  97.6 F (36.4 C)  TempSrc:  Oral  Oral  SpO2: 94%  99%   Weight:      Height:        Intake/Output Summary (Last 24 hours) at 04/23/16 1542 Last data filed at 04/23/16 1323  Gross per 24 hour  Intake           895.35 ml  Output             1925 ml  Net         -1029.65 ml   Filed Weights   04/22/16 1947 04/23/16 0243  Weight: 64.4 kg (142 lb) 65 kg (143 lb 3.2 oz)    Examination:  General exam: Appears calm and comfortable  Respiratory system: Clear to auscultation. Respiratory effort normal. Cardiovascular system: S1 & S2 heard, I/VI murmur, RRR. No JVD, murmurs, rubs, gallops or clicks. No pedal edema. Gastrointestinal system: Abdomen is nondistended, soft and nontender. No organomegaly or masses felt. Normal bowel sounds heard. Central nervous system: Alert and oriented.  No focal neurological deficits. Extremities: Symmetric 5 x 5 power. Skin: No rashes, lesions or ulcers Psychiatry: Judgement and insight appear normal. Mood & affect appropriate.     Data Reviewed: I have personally reviewed following labs and imaging studies  CBC:  Recent Labs Lab 04/22/16 1950 04/23/16 0252 04/23/16 0824  WBC 7.0 7.2 7.5  NEUTROABS 5.1  --   --   HGB 13.7 12.6* 12.8*  HCT 41.0 37.6* 39.3  MCV 87.4 85.6 86.2  PLT 283 254 585   Basic Metabolic Panel:  Recent Labs Lab 04/22/16 1950 04/23/16 0252 04/23/16 0824  NA 137  --  141  K 3.7  --  3.8  CL 103  --  108  CO2 27  --  26  GLUCOSE 176*  --  112*  BUN 33*  --  22*  CREATININE 1.83* 1.30* 1.14  CALCIUM 9.6  --  9.4   GFR: Estimated Creatinine Clearance: 48.3 mL/min (by C-G formula based on SCr of  1.14 mg/dL). Liver Function Tests:  Recent Labs Lab 04/22/16 1950  AST 26  ALT 35  ALKPHOS 64  BILITOT 0.6  PROT 7.6  ALBUMIN 4.4   No results for input(s): LIPASE, AMYLASE in the last 168 hours. No results for input(s): AMMONIA in the last 168 hours. Coagulation Profile:  Recent Labs Lab 04/22/16 1950 04/23/16 1216  INR 0.98 1.13   Cardiac Enzymes:  Recent Labs Lab 04/23/16 0252 04/23/16 0824 04/23/16 1447  TROPONINI 5.04* 29.29* 51.57*   BNP (last 3 results) No results for input(s): PROBNP in the last 8760 hours. HbA1C: No results for input(s): HGBA1C in the last 72 hours. CBG: No results for input(s): GLUCAP in the last 168 hours. Lipid Profile:  Recent Labs  04/23/16 0346  CHOL 121  HDL 22*  LDLCALC 76  TRIG 117  CHOLHDL 5.5   Thyroid Function Tests:  Recent Labs  04/23/16 0252  TSH 6.836*   Anemia Panel: No results for input(s): VITAMINB12, FOLATE, FERRITIN, TIBC, IRON, RETICCTPCT in the last 72 hours. Sepsis Labs: No results for input(s): PROCALCITON, LATICACIDVEN in the last 168 hours.  Recent Results (from the past 240 hour(s))  MRSA PCR Screening     Status: None   Collection Time: 04/23/16  3:02 AM  Result Value Ref Range Status   MRSA by PCR NEGATIVE NEGATIVE Final    Comment:        The GeneXpert MRSA Assay (FDA approved for NASAL specimens only), is one component of a comprehensive MRSA colonization surveillance program. It is not intended to diagnose MRSA infection nor to guide or monitor treatment for MRSA infections.          Radiology Studies: Dg Chest Portable 1 View  Result Date: 04/22/2016 CLINICAL DATA:  Acute onset of midsternal chest pain, with pain in both arms. Initial encounter. EXAM: PORTABLE CHEST 1 VIEW COMPARISON:  Chest radiograph performed 12/23/2015 FINDINGS: The lungs are well-aerated. Mild vascular congestion is noted. There is no evidence of focal opacification, pleural effusion or pneumothorax.  The cardiomediastinal silhouette is borderline normal in size. The patient is status post median sternotomy. No acute osseous abnormalities are seen. IMPRESSION: Mild vascular congestion noted.  Lungs remain grossly clear. Electronically Signed   By: Garald Balding M.D.   On: 04/22/2016 20:24        Scheduled Meds: . aspirin EC  81 mg Oral Daily  . atorvastatin  80 mg Oral q1800  . budesonide  0.25 mg Inhalation BID  .  metoprolol tartrate  25 mg Oral BID  . omeprazole  40 mg Oral Daily  . sodium chloride flush  3 mL Intravenous Q12H  . tamsulosin  0.4 mg Oral QHS   Continuous Infusions: . sodium chloride    . dextrose 5 % and 0.9% NaCl 100 mL/hr at 04/23/16 0754  . heparin 900 Units/hr (04/23/16 1201)  . nitroGLYCERIN 20 mcg/min (04/22/16 2229)     LOS: 1 day    Time spent: 35 minutes    Loretha Stapler, MD [facility] Pager [phone]  If 7PM-7AM, please contact night-coverage [email or web address] Password [facility] 04/23/2016, 3:42 PM

## 2016-04-23 NOTE — Consult Note (Signed)
Cardiology Consult    Patient ID: Billy [name] MRN: [id number], DOB/AGE: 79-Nov-1936   Admit date: 04/22/2016 Date of Consult: 04/23/2016  Primary Physician: Curlene Labrum, MD Reason for Consult: Chest pain Primary Cardiologist: Rozann Lesches, MD Requesting Provider: Dr. Hal Hope   History of Present Illness    Mr. Billy [name] is a 81 year old male with a significant past medical history of coronary artery disease status post multivessel coronary artery bypass grafting with subsequent stents, hypertension, and carotid artery disease is here for chest pain.  He states at around 5:30pm yesterday, he started having chest pain.  Chest pain was located at the center of his chest and went to his jaw and both arms.  The pain is described as "angina".  His pain was rated 8/10 in intensity, lasting around 40 minutes, eventually relieved with the medications he was given at the emergency department.  He denies any other associated symptoms such as sweating, nausea, lightheadedness, dizziness, fainting, or feeling as if he is going to faint.  At this time he is without any chest pain.    Past Medical History   Past Medical History:  Diagnosis Date  . Carotid artery disease ([place])    62-83% LICA and 6-62% RICA - December 2013  . Coronary atherosclerosis of native coronary artery    Multivessel status post CABG, DES x 2 SVG to OM with staged DES SVG to RCA November 2015, DES to SVG to PDA 05/2015  . Essential hypertension, benign   . Gout   . HOH (hard of hearing)   . Sinus bradycardia    Asymptomatic    Past Surgical History:  Procedure Laterality Date  . APPENDECTOMY     Ruptured  . CARDIAC CATHETERIZATION N/A 05/19/2015   Procedure: Left Heart Cath and Cors/Grafts Angiography;  Surgeon: Lorretta Harp, MD;  Location: [place] [facility];  Service: Cardiovascular;  Laterality: N/A;  . CARDIAC CATHETERIZATION N/A 05/19/2015   Procedure: Coronary Stent Intervention;  Surgeon:  Lorretta Harp, MD;  Location: [place] [facility];  Service: Cardiovascular;  Laterality: N/A;  . CATARACT EXTRACTION W/PHACO Right 11/13/2012   Procedure: CATARACT EXTRACTION PHACO AND INTRAOCULAR LENS PLACEMENT (IOC);  Surgeon: Tonny Branch, MD;  Location: [facility];  Service: Ophthalmology;  Laterality: Right;  CDE:12.75  . CATARACT EXTRACTION W/PHACO Left 01/11/2013   Procedure: CATARACT EXTRACTION PHACO AND INTRAOCULAR LENS PLACEMENT (IOC);  Surgeon: Tonny Branch, MD;  Location: [facility];  Service: Ophthalmology;  Laterality: Left;  CDE: 21.13  . COLONOSCOPY  09/2013   Dr. Burke Keels: colitis descending colon and sigmoid colon.Bx ?Cdiff vs ischemic.  Marland Kitchen CORONARY ARTERY BYPASS GRAFT  11/09/1999   LIMA to LAD, SVG to diagonal, SVG to OM1 and OM2, SVG to PDA  . ENTEROSCOPY N/A 12/26/2015   Procedure: ENTEROSCOPY;  Surgeon: Daneil Dolin, MD;  Location: [facility];  Service: Endoscopy;  Laterality: N/A;  . ESOPHAGOGASTRODUODENOSCOPY N/A 05/20/2015   Procedure: ESOPHAGOGASTRODUODENOSCOPY (EGD);  Surgeon: Wilford Corner, MD;  Location: [facility] ENDOSCOPY;  Service: Endoscopy;  Laterality: N/A;  . ESOPHAGOGASTRODUODENOSCOPY N/A 12/26/2015   Procedure: ESOPHAGOGASTRODUODENOSCOPY (EGD);  Surgeon: Daneil Dolin, MD;  Location: [facility];  Service: Endoscopy;  Laterality: N/A;  . GIVENS CAPSULE STUDY N/A 12/24/2015   Procedure: GIVENS CAPSULE STUDY;  Surgeon: Rogene Houston, MD;  Location: [facility];  Service: Endoscopy;  Laterality: N/A;  . LEFT HEART CATHETERIZATION WITH CORONARY ANGIOGRAM N/A 04/22/2014   Procedure: LEFT HEART CATHETERIZATION WITH  CORONARY ANGIOGRAM;  Surgeon: Burnell Blanks, MD;  Location: [facility] CATH LAB;  Service: Cardiovascular;  Laterality: N/A;  . PERCUTANEOUS CORONARY STENT INTERVENTION (PCI-S) N/A 04/25/2014   Procedure: PERCUTANEOUS CORONARY STENT INTERVENTION (PCI-S);  Surgeon: Peter M Martinique, MD;  Location: [facility] CATH LAB;  Service: Cardiovascular;  Laterality: N/A;      Allergies  Allergies  Allergen Reactions  . Protonix [Pantoprazole Sodium] Itching and Rash    Inpatient Medications    . aspirin EC  81 mg Oral Daily  . atorvastatin  80 mg Oral q1800  . budesonide  0.25 mg Inhalation BID  . metoprolol tartrate  25 mg Oral BID  . omeprazole  40 mg Oral Daily  . tamsulosin  0.4 mg Oral QHS    Family History    Family History  Problem Relation Age of Onset  . Cancer Father     colon, age 69s    Social History    Social History   Social History  . Marital status: Married    Spouse name: N/A  . Number of children: N/A  . Years of education: N/A   Occupational History  . DISABLED     [employer]   Social History Main Topics  . Smoking status: Former Smoker    Packs/day: 0.50    Years: 50.00    Types: Cigarettes    Start date: 01/08/1947    Quit date: 06/08/1999  . Smokeless tobacco: Never Used  . Alcohol use No  . Drug use: No  . Sexual activity: Yes    Birth control/ protection: None   Other Topics Concern  . Not on file   Social History Narrative   He lives in [place] with his wife.     Review of Systems    General:  No chills, fever, night sweats or weight changes.  Cardiovascular:  No chest pain, dyspnea on exertion, edema, orthopnea, palpitations, paroxysmal nocturnal dyspnea. Dermatological: No rash, lesions/masses Respiratory: No cough, dyspnea Urologic: No hematuria, dysuria Abdominal:   No nausea, vomiting, diarrhea, bright red blood per rectum, melena, or hematemesis Neurologic:  No visual changes, wkns, changes in mental status. All other systems reviewed and are otherwise negative except as noted above.  Physical Exam    Blood pressure (!) 161/83, pulse 67, temperature 98.3 F (36.8 C), temperature source Oral, resp. rate 18, height 5\' 7"  (1.702 m), weight 65 kg (143 lb 3.2 oz), SpO2 100 %.  General: Pleasant, NAD Psych: Normal affect. Neuro: Alert and oriented X 3. Moves all  extremities spontaneously. HEENT: Normal  Neck: Supple without bruits or JVD. Lungs:  Resp regular and unlabored, CTA. Heart: RRR no s3, s4, or murmurs. Abdomen: Soft, non-tender, non-distended, BS + x 4.  Extremities: No clubbing, cyanosis or edema. DP/PT/Radials 2+ and equal bilaterally.  Labs    Troponin [facility] Test)  Recent Labs  04/22/16 1950  TROPIPOC 0.01   No results for input(s): CKTOTAL, CKMB, TROPONINI in the last 72 hours. Lab Results  Component Value Date   WBC 7.2 04/23/2016   HGB 12.6 (L) 04/23/2016   HCT 37.6 (L) 04/23/2016   MCV 85.6 04/23/2016   PLT 254 04/23/2016    Recent Labs Lab 04/22/16 1950  NA 137  K 3.7  CL 103  CO2 27  BUN 33*  CREATININE 1.83*  CALCIUM 9.6  PROT 7.6  BILITOT 0.6  ALKPHOS 64  ALT 35  AST 26  GLUCOSE 176*   Lab Results  Component Value Date   CHOL 134 04/23/2014   HDL 33 (L) 04/23/2014   LDLCALC 81 04/23/2014   TRIG 99 04/23/2014   No results found for: [facility]   Radiology Studies    Dg Chest Portable 1 View  Result Date: 04/22/2016 CLINICAL DATA:  Acute onset of midsternal chest pain, with pain in both arms. Initial encounter. EXAM: PORTABLE CHEST 1 VIEW COMPARISON:  Chest radiograph performed 12/23/2015 FINDINGS: The lungs are well-aerated. Mild vascular congestion is noted. There is no evidence of focal opacification, pleural effusion or pneumothorax. The cardiomediastinal silhouette is borderline normal in size. The patient is status post median sternotomy. No acute osseous abnormalities are seen. IMPRESSION: Mild vascular congestion noted.  Lungs remain grossly clear. Electronically Signed   By: Garald Balding M.D.   On: 04/22/2016 20:24    EKG & Cardiac Imaging    EKG: 04/22/16 (19:53 hrs):  NSR, anterolateral ST depressions, anterior infarct age undetermined  Echocardiogram:  10/02/15:  - Left ventricle: The cavity size was at the upper limits of   normal. Wall thickness was normal. The estimated  ejection   fraction was 55%. There is akinesis of the basal-midinferolateral   and inferior myocardium. Features are consistent with a   pseudonormal left ventricular filling pattern, with concomitant   abnormal relaxation and increased filling pressure (grade 2   diastolic dysfunction). - Aortic valve: Mildly calcified annulus. Trileaflet; mildly   calcified leaflets. There was trivial regurgitation. - Mitral valve: Mildly thickened leaflets . There was mild to   moderate regurgitation. - Left atrium: The atrium was moderately dilated. - Right atrium: Central venous pressure (est): 3 mm Hg. - Atrial septum: No defect or patent foramen ovale was identified. - Tricuspid valve: There was trivial regurgitation. - Pulmonary arteries: PA peak pressure: 36 mm Hg (S). - Pericardium, extracardiac: There was no pericardial effusion.  Heart catheterization:  05/19/15:  Mid RCA to Dist RCA lesion, 100% stenosed.  Ost Cx to Prox Cx lesion, 95% stenosed.  Prox Cx to Mid Cx lesion, 100% stenosed.  Ost LAD to Prox LAD lesion, 80% stenosed.  Prox Graft lesion, before 2nd Mrg, 60% stenosed.  Prox Graft to Mid Graft lesion, before 2nd Mrg, 60% stenosed. Dist Graft-1 lesion, 99% stenosed.  Assessment & Plan    # Chest pain: Secondary to NSTEMI-ACS.  Low suspicion for acute pulmonary embolism or other non-coronary causes of chest pain.  Troponin is increasing and ECG with myocardial ischemia changes noted.  Patient has been placed on heparin drip and given high dose aspirin.  Of note, patient recently had a gastrointestinal bleed and has chronic kidney disease.   - Continue heparin and aspirin at this time. - Will need to discuss possible cardiac catheterization versus ischemic guided approach with day team.      # Coronary artery disease: Patient with known coronary artery disease now here with NSTEMI-ACS.  He is on guideline directed medical therapy at this time.   - Continue home  medications. - Management as above  # Essential hypertension: Blood pressure is above goal at this time. - Continue home blood pressure medications.    Signed, Jon Billings, MD 04/23/2016, 3:45 AM

## 2016-04-23 NOTE — ED Notes (Signed)
Report given to [facility] with [facility]

## 2016-04-23 NOTE — Progress Notes (Signed)
Patient Name: Billy [name] Date of Encounter: 04/23/2016  Primary Cardiologist: Dr [facility] Problem List     Principal Problem:   NSTEMI (non-ST elevated myocardial infarction) [facility]) Active Problems:   CAD, NATIVE VESSEL   CKD (chronic kidney disease) stage 3, GFR 30-59 ml/min   CAD (coronary artery disease) of artery bypass graft   Mucosal abnormality of duodenum   Chest pain at rest     Subjective   No current CP or SOB. Hungry  Inpatient Medications    Scheduled Meds: . aspirin EC  81 mg Oral Daily  . atorvastatin  80 mg Oral q1800  . budesonide  0.25 mg Inhalation BID  . metoprolol tartrate  25 mg Oral BID  . omeprazole  40 mg Oral Daily  . tamsulosin  0.4 mg Oral QHS   Continuous Infusions: . dextrose 5 % and 0.9% NaCl 100 mL/hr at 04/23/16 0754  . heparin 750 Units/hr (04/22/16 2304)  . nitroGLYCERIN 20 mcg/min (04/22/16 2229)   PRN Meds: albuterol, alum & mag hydroxide-simeth, morphine injection, ondansetron **OR** ondansetron (ZOFRAN) IV   Vital Signs    Vitals:   04/23/16 0443 04/23/16 0543 04/23/16 0741 04/23/16 0840  BP: 138/74 (!) 143/83 (!) 152/81   Pulse: 64 61 62   Resp: 13 13 14    Temp:  97.9 F (36.6 C)  97.9 F (36.6 C)  TempSrc:  Oral  Oral  SpO2: 95% 97% 94%   Weight:      Height:        Intake/Output Summary (Last 24 hours) at 04/23/16 1021 Last data filed at 04/23/16 0800  Gross per 24 hour  Intake           895.35 ml  Output              975 ml  Net           -79.65 ml   Filed Weights   04/22/16 1947 04/23/16 0243  Weight: 142 lb (64.4 kg) 143 lb 3.2 oz (65 kg)    Physical Exam    GEN: Well nourished, well developed, in no acute distress.  HEENT: Grossly normal.  Neck: Supple, no JVD, carotid bruits, or masses. Cardiac: RRR, soft mitral murmur, no rubs, or gallops. No clubbing, cyanosis, edema.  Radials/DP/PT 2+ and equal bilaterally.  Respiratory:  Respirations regular and unlabored, decreased BS bases  bilaterally. GI: Soft, nontender, nondistended, BS + x 4. MS: no deformity or atrophy. Skin: warm and dry, no rash. Neuro:  Strength and sensation are intact. Psych: AAOx3.  Normal affect.  Labs    CBC  Recent Labs  04/22/16 1950 04/23/16 0252 04/23/16 0824  WBC 7.0 7.2 7.5  NEUTROABS 5.1  --   --   HGB 13.7 12.6* 12.8*  HCT 41.0 37.6* 39.3  MCV 87.4 85.6 86.2  PLT 283 254 371   Basic Metabolic Panel  Recent Labs  04/22/16 1950 04/23/16 0252 04/23/16 0824  NA 137  --  141  K 3.7  --  3.8  CL 103  --  108  CO2 27  --  26  GLUCOSE 176*  --  112*  BUN 33*  --  22*  CREATININE 1.83* 1.30* 1.14  CALCIUM 9.6  --  9.4   Liver Function Tests  Recent Labs  04/22/16 1950  AST 26  ALT 35  ALKPHOS 64  BILITOT 0.6  PROT 7.6  ALBUMIN 4.4    Cardiac Enzymes  Recent Labs  04/23/16 0252 04/23/16 0824  TROPONINI 5.04* 29.29*   Thyroid Function Tests  Recent Labs  04/23/16 0252  TSH 6.836*    Telemetry    SR, PVCs and pairs - Personally Reviewed  ECG    pending - Personally Reviewed  Radiology    Dg Chest Portable 1 View Result Date: 04/22/2016 CLINICAL DATA:  Acute onset of midsternal chest pain, with pain in both arms. Initial encounter. EXAM: PORTABLE CHEST 1 VIEW COMPARISON:  Chest radiograph performed 12/23/2015 FINDINGS: The lungs are well-aerated. Mild vascular congestion is noted. There is no evidence of focal opacification, pleural effusion or pneumothorax. The cardiomediastinal silhouette is borderline normal in size. The patient is status post median sternotomy. No acute osseous abnormalities are seen. IMPRESSION: Mild vascular congestion noted.  Lungs remain grossly clear. Electronically Signed   By: Garald Balding M.D.   On: 04/22/2016 20:24    Cardiac Studies   CATH: 05/2015 Diagnostic Diagram    Post-Intervention Diagram    S/P DES distal SVG-RCA proximal to previously placed stent   Patient Profile     81 year old male with a  significant past medical history of coronary artery disease status post multivessel coronary artery bypass grafting with subsequent stents, hypertension, and carotid artery disease is here for chest pain>>NSTEMI  Assessment & Plan    Principal Problem: 1.   NSTEMI (non-ST elevated myocardial infarction) ([place]) - currently pain-free on ASA, heparin, statin (increased to high-dose), BB w/ dose increase 12.5>25 mg bid. - PTA was on Imdur 30 mg qd, now on NTG paste - currently pain-free but troponin very high>>cath today - The risks and benefits of a cardiac catheterization including, but not limited to, death, stroke, MI, kidney damage and bleeding were discussed with the patient who indicates understanding and agrees to proceed.   Active Problems:  2. CAD, NATIVE VESSEL - See above   3.  CKD (chronic kidney disease) stage 3, GFR 30-59 ml/min - BUN/Cr before previous intervention were 28/1.72, with hydration, BUN/Cr dropped to 18/1.19 after PCI - he is not on diuretic or ACE/ARB - Cr improved w/ hydration - follow per IM  4.  CAD (coronary artery disease) of artery bypass graft - see above  5.  Mucosal abnormality of duodenum - per IM  6.  Chest pain at rest - see above    Signed, Barrett, Rhonda, PA-C  04/23/2016, 10:21 AM

## 2016-04-23 NOTE — Progress Notes (Signed)
ANTICOAGULATION CONSULT NOTE - Follow Up Consult  Pharmacy Consult for heparin Indication: NSTEMI  Allergies  Allergen Reactions  . Protonix [Pantoprazole Sodium] Itching and Rash    Patient Measurements: Height: 5\' 7"  (170.2 cm) Weight: 143 lb 3.2 oz (65 kg) IBW/kg (Calculated) : 66.1  Vital Signs: Temp: 97.9 F (36.6 C) (11/17 0840) Temp Source: Oral (11/17 0840) BP: 167/79 (11/17 1011) Pulse Rate: 66 (11/17 1011)  Labs:  Recent Labs  04/22/16 1950 04/23/16 0252 04/23/16 0824 04/23/16 0943  HGB 13.7 12.6* 12.8*  --   HCT 41.0 37.6* 39.3  --   PLT 283 254 265  --   LABPROT 13.0  --   --   --   INR 0.98  --   --   --   HEPARINUNFRC  --   --   --  0.22*  CREATININE 1.83* 1.30* 1.14  --   TROPONINI  --  5.04* 29.29*  --     Estimated Creatinine Clearance: 48.3 mL/min (by C-G formula based on SCr of 1.14 mg/dL).   Medications:  Infusions:  . dextrose 5 % and 0.9% NaCl 100 mL/hr at 04/23/16 0754  . heparin 750 Units/hr (04/22/16 2304)  . nitroGLYCERIN 20 mcg/min (04/22/16 2229)    Assessment: 81 y/o male on IV heparin for NSTEMI, awaiting cath today. He has a history of GIB in setting of DAPT.   Heparin level is subtherapeutic at 0.22 on 750 units/hr. No bleeding noted, CBC is stable.  Goal of Therapy:  Heparin level 0.3-0.7 units/ml Monitor platelets by anticoagulation protocol: Yes   Plan:  - Heparin 1000 units IV bolus then increase rate to 900 units/hr - Follow-up after cath OR obtain 8 hr heparin level - Monitor for s/sx of bleeding - TSH elevated - consider check free T4   Renold Genta, PharmD, BCPS Clinical Pharmacist Phone for today - [place] - [phone] 04/23/2016 11:26 AM

## 2016-04-23 NOTE — H&P (View-Only) (Signed)
Patient Name: Billy [name] Date of Encounter: 04/23/2016  Primary Cardiologist: Dr [facility] Problem List     Principal Problem:   NSTEMI (non-ST elevated myocardial infarction) [facility]) Active Problems:   CAD, NATIVE VESSEL   CKD (chronic kidney disease) stage 3, GFR 30-59 ml/min   CAD (coronary artery disease) of artery bypass graft   Mucosal abnormality of duodenum   Chest pain at rest     Subjective   No current CP or SOB. Hungry  Inpatient Medications    Scheduled Meds: . aspirin EC  81 mg Oral Daily  . atorvastatin  80 mg Oral q1800  . budesonide  0.25 mg Inhalation BID  . metoprolol tartrate  25 mg Oral BID  . omeprazole  40 mg Oral Daily  . tamsulosin  0.4 mg Oral QHS   Continuous Infusions: . dextrose 5 % and 0.9% NaCl 100 mL/hr at 04/23/16 0754  . heparin 750 Units/hr (04/22/16 2304)  . nitroGLYCERIN 20 mcg/min (04/22/16 2229)   PRN Meds: albuterol, alum & mag hydroxide-simeth, morphine injection, ondansetron **OR** ondansetron (ZOFRAN) IV   Vital Signs    Vitals:   04/23/16 0443 04/23/16 0543 04/23/16 0741 04/23/16 0840  BP: 138/74 (!) 143/83 (!) 152/81   Pulse: 64 61 62   Resp: 13 13 14    Temp:  97.9 F (36.6 C)  97.9 F (36.6 C)  TempSrc:  Oral  Oral  SpO2: 95% 97% 94%   Weight:      Height:        Intake/Output Summary (Last 24 hours) at 04/23/16 1021 Last data filed at 04/23/16 0800  Gross per 24 hour  Intake           895.35 ml  Output              975 ml  Net           -79.65 ml   Filed Weights   04/22/16 1947 04/23/16 0243  Weight: 142 lb (64.4 kg) 143 lb 3.2 oz (65 kg)    Physical Exam    GEN: Well nourished, well developed, in no acute distress.  HEENT: Grossly normal.  Neck: Supple, no JVD, carotid bruits, or masses. Cardiac: RRR, soft mitral murmur, no rubs, or gallops. No clubbing, cyanosis, edema.  Radials/DP/PT 2+ and equal bilaterally.  Respiratory:  Respirations regular and unlabored, decreased BS bases  bilaterally. GI: Soft, nontender, nondistended, BS + x 4. MS: no deformity or atrophy. Skin: warm and dry, no rash. Neuro:  Strength and sensation are intact. Psych: AAOx3.  Normal affect.  Labs    CBC  Recent Labs  04/22/16 1950 04/23/16 0252 04/23/16 0824  WBC 7.0 7.2 7.5  NEUTROABS 5.1  --   --   HGB 13.7 12.6* 12.8*  HCT 41.0 37.6* 39.3  MCV 87.4 85.6 86.2  PLT 283 254 185   Basic Metabolic Panel  Recent Labs  04/22/16 1950 04/23/16 0252 04/23/16 0824  NA 137  --  141  K 3.7  --  3.8  CL 103  --  108  CO2 27  --  26  GLUCOSE 176*  --  112*  BUN 33*  --  22*  CREATININE 1.83* 1.30* 1.14  CALCIUM 9.6  --  9.4   Liver Function Tests  Recent Labs  04/22/16 1950  AST 26  ALT 35  ALKPHOS 64  BILITOT 0.6  PROT 7.6  ALBUMIN 4.4    Cardiac Enzymes  Recent Labs  04/23/16 0252 04/23/16 0824  TROPONINI 5.04* 29.29*   Thyroid Function Tests  Recent Labs  04/23/16 0252  TSH 6.836*    Telemetry    SR, PVCs and pairs - Personally Reviewed  ECG    pending - Personally Reviewed  Radiology    Dg Chest Portable 1 View Result Date: 04/22/2016 CLINICAL DATA:  Acute onset of midsternal chest pain, with pain in both arms. Initial encounter. EXAM: PORTABLE CHEST 1 VIEW COMPARISON:  Chest radiograph performed 12/23/2015 FINDINGS: The lungs are well-aerated. Mild vascular congestion is noted. There is no evidence of focal opacification, pleural effusion or pneumothorax. The cardiomediastinal silhouette is borderline normal in size. The patient is status post median sternotomy. No acute osseous abnormalities are seen. IMPRESSION: Mild vascular congestion noted.  Lungs remain grossly clear. Electronically Signed   By: Garald Balding M.D.   On: 04/22/2016 20:24    Cardiac Studies   CATH: 05/2015 Diagnostic Diagram    Post-Intervention Diagram    S/P DES distal SVG-RCA proximal to previously placed stent   Patient Profile     81 year old male with a  significant past medical history of coronary artery disease status post multivessel coronary artery bypass grafting with subsequent stents, hypertension, and carotid artery disease is here for chest pain>>NSTEMI  Assessment & Plan    Principal Problem: 1.   NSTEMI (non-ST elevated myocardial infarction) ([place]) - currently pain-free on ASA, heparin, statin (increased to high-dose), BB w/ dose increase 12.5>25 mg bid. - PTA was on Imdur 30 mg qd, now on NTG paste - currently pain-free but troponin very high>>cath today - The risks and benefits of a cardiac catheterization including, but not limited to, death, stroke, MI, kidney damage and bleeding were discussed with the patient who indicates understanding and agrees to proceed.   Active Problems:  2. CAD, NATIVE VESSEL - See above   3.  CKD (chronic kidney disease) stage 3, GFR 30-59 ml/min - BUN/Cr before previous intervention were 28/1.72, with hydration, BUN/Cr dropped to 18/1.19 after PCI - he is not on diuretic or ACE/ARB - Cr improved w/ hydration - follow per IM  4.  CAD (coronary artery disease) of artery bypass graft - see above  5.  Mucosal abnormality of duodenum - per IM  6.  Chest pain at rest - see above    Signed, Kahlee Metivier, PA-C  04/23/2016, 10:21 AM

## 2016-04-23 NOTE — Progress Notes (Signed)
On call cards and attending notified of patient's arrival.

## 2016-04-24 LAB — CBC
HCT: 39.1 % (ref 39.0–52.0)
Hemoglobin: 12.7 g/dL — ABNORMAL LOW (ref 13.0–17.0)
MCH: 28.2 pg (ref 26.0–34.0)
MCHC: 32.5 g/dL (ref 30.0–36.0)
MCV: 86.7 fL (ref 78.0–100.0)
PLATELETS: 225 10*3/uL (ref 150–400)
RBC: 4.51 MIL/uL (ref 4.22–5.81)
RDW: 14.3 % (ref 11.5–15.5)
WBC: 6.7 10*3/uL (ref 4.0–10.5)

## 2016-04-24 LAB — BASIC METABOLIC PANEL
Anion gap: 8 (ref 5–15)
BUN: 13 mg/dL (ref 6–20)
CHLORIDE: 108 mmol/L (ref 101–111)
CO2: 25 mmol/L (ref 22–32)
Calcium: 9.2 mg/dL (ref 8.9–10.3)
Creatinine, Ser: 0.99 mg/dL (ref 0.61–1.24)
GFR calc non Af Amer: >60 mL/min (ref >60)
Glucose, Bld: 122 mg/dL — ABNORMAL HIGH (ref 65–99)
POTASSIUM: 4.3 mmol/L (ref 3.5–5.1)
SODIUM: 141 mmol/L (ref 135–145)

## 2016-04-24 LAB — POCT ACTIVATED CLOTTING TIME: Activated Clotting Time: 169 seconds

## 2016-04-24 MED ORDER — ISOSORBIDE MONONITRATE ER 30 MG PO TB24
30.0000 mg | ORAL_TABLET | Freq: Every day | ORAL | Status: DC
  Administered 2016-04-24: 30 mg via ORAL
  Filled 2016-04-24: qty 1

## 2016-04-24 MED ORDER — FAMOTIDINE 40 MG PO TABS: 40.0000 mg | ORAL_TABLET | Freq: Every day | ORAL | 0 refills | Status: DC

## 2016-04-24 MED ORDER — ANGIOPLASTY BOOK
Freq: Once | Status: DC
  Filled 2016-04-24: qty 1

## 2016-04-24 MED ORDER — ATORVASTATIN CALCIUM 80 MG PO TABS: 80.0000 mg | ORAL_TABLET | Freq: Every day | ORAL | 0 refills | Status: DC

## 2016-04-24 MED ORDER — CLOPIDOGREL BISULFATE 75 MG PO TABS: 75.0000 mg | ORAL_TABLET | Freq: Every day | ORAL | 0 refills | Status: DC

## 2016-04-24 MED ORDER — FAMOTIDINE 20 MG PO TABS: 40.0000 mg | ORAL_TABLET | Freq: Every day | ORAL | Status: DC

## 2016-04-24 MED ORDER — CYCLOBENZAPRINE HCL 10 MG PO TABS: 10.0000 mg | ORAL_TABLET | Freq: Two times a day (BID) | ORAL | 0 refills | Status: DC | PRN

## 2016-04-24 MED ORDER — HEART ATTACK BOUNCING BOOK
Freq: Once | Status: AC
  Administered 2016-04-24: 02:00:00
  Filled 2016-04-24: qty 1

## 2016-04-24 NOTE — Progress Notes (Signed)
Patient Name: Billy [name] Date of Encounter: 04/24/2016  Primary Cardiologist: Dr [facility] Problem List     Principal Problem:   NSTEMI (non-ST elevated myocardial infarction) [facility]) Active Problems:   Mixed hyperlipidemia   CAD, NATIVE VESSEL   CKD (chronic kidney disease) stage 3, GFR 30-59 ml/min   CAD (coronary artery disease) of artery bypass graft   Mucosal abnormality of duodenum   Chest pain at rest   Coronary stent thrombosis     Subjective   No current CP or SOB.   Inpatient Medications    Scheduled Meds: . angioplasty book   Does not apply Once  . aspirin EC  81 mg Oral Daily  . atorvastatin  80 mg Oral q1800  . budesonide  0.25 mg Inhalation BID  . clopidogrel  75 mg Oral Q breakfast  . metoprolol tartrate  25 mg Oral BID  . omeprazole  40 mg Oral Daily  . sodium chloride flush  3 mL Intravenous Q12H  . tamsulosin  0.4 mg Oral QHS   Continuous Infusions:  PRN Meds: sodium chloride, acetaminophen, albuterol, alum & mag hydroxide-simeth, morphine injection, ondansetron **OR** ondansetron (ZOFRAN) IV, sodium chloride flush   Vital Signs    Vitals:   04/24/16 0525 04/24/16 0545 04/24/16 0600 04/24/16 0700  BP: 139/64 (!) 145/67 135/61 140/67  Pulse:    71  Resp: 17 16 14 14   Temp:    98.1 F (36.7 C)  TempSrc:    Oral  SpO2:    99%  Weight:      Height:        Intake/Output Summary (Last 24 hours) at 04/24/16 0924 Last data filed at 04/24/16 0759  Gross per 24 hour  Intake            804.8 ml  Output             2575 ml  Net          -1770.2 ml   Filed Weights   04/22/16 1947 04/23/16 0243 04/23/16 1920  Weight: 142 lb (64.4 kg) 143 lb 3.2 oz (65 kg) 148 lb 2.4 oz (67.2 kg)    Physical Exam    GEN: Well nourished, well developed, in no acute distress.  HEENT: Grossly normal.  Neck: Supple, no JVD, carotid bruits, or masses. Cardiac: RRR, soft mitral murmur, no rubs, or gallops. No clubbing, cyanosis, edema.   Radials/DP/PT 2+ and equal bilaterally.  Respiratory:  Respirations regular and unlabored, decreased BS bases bilaterally. GI: Soft, nontender, nondistended, BS + x 4. MS: no deformity or atrophy. Skin: warm and dry, no rash. Neuro:  Strength and sensation are intact. Psych: AAOx3.  Normal affect.  Labs    CBC  Recent Labs  04/22/16 1950  04/23/16 0824 04/24/16 0242  WBC 7.0  < > 7.5 6.7  NEUTROABS 5.1  --   --   --   HGB 13.7  < > 12.8* 12.7*  HCT 41.0  < > 39.3 39.1  MCV 87.4  < > 86.2 86.7  PLT 283  < > 265 225  < > = values in this interval not displayed. Basic Metabolic Panel  Recent Labs  04/23/16 0824 04/24/16 0242  NA 141 141  K 3.8 4.3  CL 108 108  CO2 26 25  GLUCOSE 112* 122*  BUN 22* 13  CREATININE 1.14 0.99  CALCIUM 9.4 9.2   Liver Function Tests  Recent Labs  04/22/16 1950  AST 26  ALT 35  ALKPHOS 64  BILITOT 0.6  PROT 7.6  ALBUMIN 4.4    Cardiac Enzymes  Recent Labs  04/23/16 0252 04/23/16 0824 04/23/16 1447  TROPONINI 5.04* 29.29* 51.57*   Thyroid Function Tests  Recent Labs  04/23/16 0252  TSH 6.836*    Telemetry    SR, PVCs and pairs - Personally Reviewed  ECG    pending - Personally Reviewed  Radiology    Dg Chest Portable 1 View Result Date: 04/22/2016 CLINICAL DATA:  Acute onset of midsternal chest pain, with pain in both arms. Initial encounter. EXAM: PORTABLE CHEST 1 VIEW COMPARISON:  Chest radiograph performed 12/23/2015 FINDINGS: The lungs are well-aerated. Mild vascular congestion is noted. There is no evidence of focal opacification, pleural effusion or pneumothorax. The cardiomediastinal silhouette is borderline normal in size. The patient is status post median sternotomy. No acute osseous abnormalities are seen. IMPRESSION: Mild vascular congestion noted.  Lungs remain grossly clear. Electronically Signed   By: Garald Balding M.D.   On: 04/22/2016 20:24    Cardiac Studies   Cath 04/23/2016  Native  Vessels:  Mid RCA to Dist RCA lesion, 100 %stenosed.  Ost Cx to Prox Cx lesion, 95 %stenosed.  Prox Cx to Mid Cx lesion, 100 %stenosed.  Ost LAD to Prox LAD lesion, 80 %stenosed. The distal vessel fills via the LIMA graft.  LV end diastolic pressure is normal.  Graft Angiography:  LIMA-LAD is widely patent.  SVG-RPDA: Prox Graft to Mid Graft lesion, 45 %stenosed. Mid Graft to Dist Graft stent 0 %stenosed  SVG-DIAG: Prox Graft to Mid Graft lesion, 35 %stenosed.  CULPRIT LESION:  SVG-OM1-OM2: Prox Graft lesion, 90 %stenosed - heavy thrombosis involving the previously placed stent extending all the way to the initial anastomosis. Mid Graft to Dist Graft lesion, 100 %stenosed.  A STENT SYNERGY DES 3X28 drug eluting stent was successfully placed - in the more proximal segment extending to and overlaps previously placed stent.  A STENT SYNERGY DES 3X38 drug eluting stent was successfully placed just proximal to the initial anastomosis and extends proximally to overlap the previously placed stent.  Post intervention, there is a 0% residual stenosis - TIMI 3 flow restored both new stents were widely patent and the previously placed stent was treated with balloon angioplasty with a 3.0 mm noncompliant balloon.   Successful but very complicated PCI restoring TIMI-3 flow in the sequential SVG-OM1-OM 2. There was extensive thrombosis from the proximal graft all the way into both graft vessels.  2 new stents were placed overlapping the prior stent both proximally and distally.  Plan:  Transfer to 6 central postprocedure unit for ongoing care.  Continue Angiomax until current bag completed.  Continue IV nitroglycerin for blood pressure control and taper off overnight.  With the extensive amount of stent in 2 grafts now, I would recommend essentially lifelong Plavix and less absolutely has to stop. Could consider stopping aspirin after 3 months.    Patient Profile     81 year old male  with a significant past medical history of coronary artery disease status post multivessel coronary artery bypass grafting with subsequent stents, hypertension, and carotid artery disease is here for chest pain>>NSTEMI  Assessment & Plan    Principal Problem: 1.   NSTEMI (non-ST elevated myocardial infarction) ([place]) - currently pain-free but troponin very high 51.57 - Cath yesterday with extensive thrombosis from the proximal graft of sequential OM1/OM2 all the way into both graft vessels.  Successful but very complicated PCI  restoring TIMI-3 flow in the sequential SVG-OM1-OM 2.   2 new stents were placed overlapping the prior stent both proximally and distally.  He feels great this am with no further CP.   - Continue ASA/statin/long acting nitrates and BB. - He is on Plavix which he should remain on for life   2. CAD, NATIVE VESSEL s/p prior CABG - See above   3.  CKD (chronic kidney disease) stage 3, GFR 30-59 ml/min - BUN/Cr stable post cath at 0.99 - he is not on diuretic or ACE/ARB - follow per IM  4.  Mucosal abnormality of duodenum - per IM  5.  HTN - Bp controlled on current meds.   6.  Hyperlipidemia -  LDL 76.  Now on high dose statin.  If patient it tolerating statin at time of TOC appt then repeat FLP and ALT in 6 weeks.    Signed, Fransico Him, MD  04/24/2016, 9:24 AM

## 2016-04-24 NOTE — Progress Notes (Signed)
morphine   2mg  refused by patient.wasted med in sink. witnessed by Vita Erm .

## 2016-04-24 NOTE — Progress Notes (Signed)
6 French sheath pulled from patients R groin with a 30 minute manual hold. Level zero at end of pull.  See flowsheet for VSS. Patient tolerated the procedure well. Pressure dressing applied and instructions given to patient to hold pressure when coughing or sneezing. Patient stated he understood. Bedrest to start at 0400.

## 2016-04-24 NOTE — Progress Notes (Signed)
I have sent a message to our [facility] office's scheduler requesting a follow-up appointment, and our office will call the patient with this information (requested 1 week TOC). Dayna Dunn PA-C

## 2016-04-24 NOTE — Progress Notes (Signed)
CARDIAC REHAB PHASE I   PRE:  Rate/Rhythm: 84 SR  BP:  Supine:   Sitting: 177/84  Standing:    SaO2: 95 RA  MODE:  Ambulation: 600 ft   POST:  Rate/Rhythm: 89 SR  BP:  Supine:   Sitting: 166/80  Standing:    SaO2: 98 RA Tolerated ambulation well independently without angina or difficulty.  Education completed with patient and spouse re: angina symptoms, NTG usage, when to call the doctor and when to call 911.  Also completed activity restrictions, site care, exercise progression, and heart healthy diet.  Wife and patient voiced understanding.  [id number]  Liliane Channel RN, BSN 04/24/2016 11:06 AM

## 2016-04-24 NOTE — Discharge Summary (Signed)
Physician Discharge Summary  Billy [name] YFV:[id number] DOB: [birth year] DOA: 04/22/2016  PCP: Curlene Labrum, MD  Admit date: 04/22/2016 Discharge date: 04/24/2016  Admitted From: Home Disposition:  Home  Recommendations for Outpatient Follow-up:  1. Follow up with PCP in 1-2 weeks- please let them know you were in the hospital, underwent a cath with stent placement 2. Discuss with PCP bleeding from penis- maybe discuss referral to urology 3. Please obtain BMP/CBC in one week 4. Fasting Lipid Panel and ALT in 6 weeks 5. Please follow up on the following pending results:  Home Health: No Equipment/Devices:None  Discharge Condition: Stable CODE STATUS: Full Code Diet recommendation: Heart Healthy  Brief/Interim Summary: Billy Dilworth Meeksis an 81 y.o.malewith hx of known CAD, s/p CABG 16 years ago, prior MI, s/p subsequent stent placement, Cathed for his NSTEMI 05/2015 with DES placed for Cx obtuse marginal vein graft followed by RCA, hx of HLD, HTN, CKD 3, presented to the ER with substernal chest pain at rest, and bilateral arm and neck pain. EKG showed new ST depressions on the precordial leads, and istat troponin was negative. His Cr was found to be 1.8, and Hb was 13g per dL. He had duodenitis by EGD during last admission, and his Brillinta was discontinued, and he was maintained on ASA. In the ER, his BP was elevated to 180, HR 80, and he was having persistent chest pain. Cardiology was consulted by EDP, recommended admission at [facility].  He was started on IV NTG, gave IV lopressor x 2, Lipitor at 80mg  x 1, and Morphine IV. Cardiology consulted and recommend cath given elevation of troponin.  Patient underwent catheterization and had stents placed.  Cardiology recommended lifelong Plavix and at least 3 months of aspirin.  Also recommend long acting nitrate, statin and beta blocker.  Discharge Diagnoses:  Principal Problem:   NSTEMI (non-ST elevated myocardial infarction)  ([place]) Active Problems:   Mixed hyperlipidemia   CAD, NATIVE VESSEL   CKD (chronic kidney disease) stage 3, GFR 30-59 ml/min   CAD (coronary artery disease) of artery bypass graft   Mucosal abnormality of duodenum   Chest pain at rest   Coronary stent thrombosis  Chest pain:  - troponins significantly elevated - underwent cath yesterday - cleared by cardiology for discharge   CKD Stage 3 - Cr improved - will discuss with PCP at next visit  Discharge Instructions  Discharge Instructions    Call MD for:  difficulty breathing, headache or visual disturbances    Complete by:  As directed    Call MD for:  extreme fatigue    Complete by:  As directed    Call MD for:  hives    Complete by:  As directed    Call MD for:  persistant dizziness or light-headedness    Complete by:  As directed    Call MD for:  persistant nausea and vomiting    Complete by:  As directed    Call MD for:  redness, tenderness, or signs of infection (pain, swelling, redness, odor or green/yellow discharge around incision site)    Complete by:  As directed    Call MD for:  severe uncontrolled pain    Complete by:  As directed    Call MD for:  temperature >100.4    Complete by:  As directed    Diet - low sodium heart healthy    Complete by:  As directed        Medication List  STOP taking these medications   esomeprazole 40 MG capsule Commonly known as:  NEXIUM     TAKE these medications   albuterol 108 (90 Base) MCG/ACT inhaler Commonly known as:  PROVENTIL HFA;VENTOLIN HFA Inhale 1-2 puffs into the lungs every 6 (six) hours as needed for wheezing or shortness of breath.   aspirin EC 81 MG tablet Take 1 tablet (81 mg total) by mouth daily.   atorvastatin 80 MG tablet Commonly known as:  LIPITOR Take 1 tablet (80 mg total) by mouth daily at 6 PM. What changed:  medication strength  how much to take  when to take this   clopidogrel 75 MG tablet Commonly known as:  PLAVIX Take 1  tablet (75 mg total) by mouth daily with breakfast. Start taking on:  04/25/2016   COLCRYS 0.6 MG tablet Generic drug:  colchicine Take 1 tablet by mouth Once daily as needed (for gout).   cyclobenzaprine 10 MG tablet Commonly known as:  FLEXERIL Take 1 tablet (10 mg total) by mouth 3 times/day as needed-between meals & bedtime for muscle spasms. What changed:  when to take this  reasons to take this   famotidine 40 MG tablet Commonly known as:  PEPCID Take 1 tablet (40 mg total) by mouth at bedtime.   ferrous sulfate 325 (65 FE) MG tablet Take 1 tablet (325 mg total) by mouth 2 (two) times daily with a meal.   fluticasone 44 MCG/ACT inhaler Commonly known as:  FLOVENT HFA Inhale 2 puffs into the lungs 2 (two) times daily.   isosorbide mononitrate 60 MG 24 hr tablet Commonly known as:  IMDUR Take 0.5 tablets (30 mg total) by mouth daily.   loratadine 10 MG tablet Commonly known as:  CLARITIN Take 10 mg by mouth at bedtime.   metoprolol tartrate 25 MG tablet Commonly known as:  LOPRESSOR Take 0.5 tablets (12.5 mg total) by mouth 2 (two) times daily.   nitroGLYCERIN 0.4 MG SL tablet Commonly known as:  NITROSTAT Place 1 tablet (0.4 mg total) under the tongue every 5 (five) minutes x 3 doses as needed. If no relief after 3rd dose, proceed to the ED for an evalutation   tamsulosin 0.4 MG Caps capsule Commonly known as:  FLOMAX Take 0.4 mg by mouth at bedtime.       Allergies  Allergen Reactions  . Protonix [Pantoprazole Sodium] Itching and Rash    Consultations:  Cardiology   Procedures/Studies: Dg Chest Portable 1 View  Result Date: 04/22/2016 CLINICAL DATA:  Acute onset of midsternal chest pain, with pain in both arms. Initial encounter. EXAM: PORTABLE CHEST 1 VIEW COMPARISON:  Chest radiograph performed 12/23/2015 FINDINGS: The lungs are well-aerated. Mild vascular congestion is noted. There is no evidence of focal opacification, pleural effusion or  pneumothorax. The cardiomediastinal silhouette is borderline normal in size. The patient is status post median sternotomy. No acute osseous abnormalities are seen. IMPRESSION: Mild vascular congestion noted.  Lungs remain grossly clear. Electronically Signed   By: Garald Balding M.D.   On: 04/22/2016 20:24   Cardiac Cath:04/23/2016  Native Vessels:  Mid RCA to Dist RCA lesion, 100 %stenosed.  Ost Cx to Prox Cx lesion, 95 %stenosed.  Prox Cx to Mid Cx lesion, 100 %stenosed.  Ost LAD to Prox LAD lesion, 80 %stenosed. The distal vessel fills via the LIMA graft.  LV end diastolic pressure is normal.  Graft Angiography:  LIMA-LAD is widely patent.  SVG-RPDA: Prox Graft to Mid Graft lesion, 45 %stenosed. Mid  Graft to Dist Graft stent 0 %stenosed  SVG-DIAG: Prox Graft to Mid Graft lesion, 35 %stenosed.  CULPRIT LESION:  SVG-OM1-OM2: Prox Graft lesion, 90 %stenosed - heavy thrombosis involving the previously placed stent extending all the way to the initial anastomosis. Mid Graft to Dist Graft lesion, 100 %stenosed.  A STENT SYNERGY DES 3X28 drug eluting stent was successfully placed - in the more proximal segment extending to and overlaps previously placed stent.  A STENT SYNERGY DES 3X38 drug eluting stent was successfully placed just proximal to the initial anastomosis and extends proximally to overlap the previously placed stent.  Post intervention, there is a 0% residual stenosis - TIMI 3 flow restored both new stents were widely patent and the previously placed stent was treated with balloon angioplasty with a 3.0 mm noncompliant balloon.  Successful but very complicated PCI restoring TIMI-3 flow in the sequential SVG-OM1-OM 2. There was extensive thrombosis from the proximal graft all the way into both graft vessels. 2 new stents were placed overlapping the prior stent both proximally and distally.   Subjective: Patient seen.  He and his wife have many questions about discharge  and follow up.  Patient had voiced that he had previously had bleeding from penis and follow up with PCP is encouraged especially given initiation of blood thinners.  Also discussed talking with primary care physician about elevated TSH and redrawing this. Patient started on pepcid (prilosec stopped).  Cardiac rehab talking with patient.  Discharge Exam: Vitals:   04/24/16 0600 04/24/16 0700  BP: 135/61 140/67  Pulse:  71  Resp: 14 14  Temp:  98.1 F (36.7 C)   Vitals:   04/24/16 0545 04/24/16 0600 04/24/16 0700 04/24/16 0945  BP: (!) 145/67 135/61 140/67   Pulse:   71   Resp: 16 14 14    Temp:   98.1 F (36.7 C)   TempSrc:   Oral   SpO2:   99% 98%  Weight:      Height:        General: Pt is alert, awake, not in acute distress Cardiovascular: RRR, S1/S2 +, I/VI murmur at mitral area, no rubs, no gallops Respiratory: CTA bilaterally, no wheezing, few rhonchi in upper lung fields Abdominal: Soft, NT, ND, bowel sounds + Extremities: no edema, no cyanosis    The results of significant diagnostics from this hospitalization (including imaging, microbiology, ancillary and laboratory) are listed below for reference.     Microbiology: Recent Results (from the past 240 hour(s))  MRSA PCR Screening     Status: None   Collection Time: 04/23/16  3:02 AM  Result Value Ref Range Status   MRSA by PCR NEGATIVE NEGATIVE Final    Comment:        The GeneXpert MRSA Assay (FDA approved for NASAL specimens only), is one component of a comprehensive MRSA colonization surveillance program. It is not intended to diagnose MRSA infection nor to guide or monitor treatment for MRSA infections.      Labs: BNP (last 3 results) No results for input(s): BNP in the last 8760 hours. Basic Metabolic Panel:  Recent Labs Lab 04/22/16 1950 04/23/16 0252 04/23/16 0824 04/24/16 0242  NA 137  --  141 141  K 3.7  --  3.8 4.3  CL 103  --  108 108  CO2 27  --  26 25  GLUCOSE 176*  --  112*  122*  BUN 33*  --  22* 13  CREATININE 1.83* 1.30* 1.14 0.99  CALCIUM 9.6  --  9.4 9.2   Liver Function Tests:  Recent Labs Lab 04/22/16 1950  AST 26  ALT 35  ALKPHOS 64  BILITOT 0.6  PROT 7.6  ALBUMIN 4.4   No results for input(s): LIPASE, AMYLASE in the last 168 hours. No results for input(s): AMMONIA in the last 168 hours. CBC:  Recent Labs Lab 04/22/16 1950 04/23/16 0252 04/23/16 0824 04/24/16 0242  WBC 7.0 7.2 7.5 6.7  NEUTROABS 5.1  --   --   --   HGB 13.7 12.6* 12.8* 12.7*  HCT 41.0 37.6* 39.3 39.1  MCV 87.4 85.6 86.2 86.7  PLT 283 254 265 225   Cardiac Enzymes:  Recent Labs Lab 04/23/16 0252 04/23/16 0824 04/23/16 1447  TROPONINI 5.04* 29.29* 51.57*   BNP: Invalid input(s): POCBNP CBG: No results for input(s): GLUCAP in the last 168 hours. D-Dimer No results for input(s): DDIMER in the last 72 hours. Hgb A1c No results for input(s): HGBA1C in the last 72 hours. Lipid Profile  Recent Labs  04/23/16 0346  CHOL 121  HDL 22*  LDLCALC 76  TRIG 117  CHOLHDL 5.5   Thyroid function studies  Recent Labs  04/23/16 0252  TSH 6.836*   Anemia work up No results for input(s): VITAMINB12, FOLATE, FERRITIN, TIBC, IRON, RETICCTPCT in the last 72 hours. Urinalysis    Component Value Date/Time   COLORURINE YELLOW 04/23/2016 1114   APPEARANCEUR CLEAR 04/23/2016 1114   LABSPEC 1.007 04/23/2016 1114   PHURINE 7.0 04/23/2016 1114   GLUCOSEU NEGATIVE 04/23/2016 1114   HGBUR NEGATIVE 04/23/2016 1114   BILIRUBINUR NEGATIVE 04/23/2016 1114   KETONESUR NEGATIVE 04/23/2016 1114   PROTEINUR NEGATIVE 04/23/2016 1114   NITRITE NEGATIVE 04/23/2016 1114   LEUKOCYTESUR NEGATIVE 04/23/2016 1114   Sepsis Labs Invalid input(s): PROCALCITONIN,  WBC,  LACTICIDVEN Microbiology Recent Results (from the past 240 hour(s))  MRSA PCR Screening     Status: None   Collection Time: 04/23/16  3:02 AM  Result Value Ref Range Status   MRSA by PCR NEGATIVE NEGATIVE  Final    Comment:        The GeneXpert MRSA Assay (FDA approved for NASAL specimens only), is one component of a comprehensive MRSA colonization surveillance program. It is not intended to diagnose MRSA infection nor to guide or monitor treatment for MRSA infections.      Time coordinating discharge: Over 30 minutes  SIGNED:   Loretha Stapler, MD  [facility] 04/24/2016, 11:01 AM Pager [phone] If 7PM-7AM, please contact night-coverage [email or web address] Password [email or web address]

## 2016-04-24 NOTE — Progress Notes (Signed)
TR BAND REMOVAL  LOCATION:    left radial  DEFLATED PER PROTOCOL:    Yes.    TIME BAND OFF / DRESSING APPLIED:    0300    SITE UPON ARRIVAL:    Level 0  SITE AFTER BAND REMOVAL:    Level 0  CIRCULATION SENSATION AND MOVEMENT:    Within Normal Limits   Yes.    COMMENTS:   Pt tolerated well; radial site teaching completed and teach back done.

## 2016-04-24 NOTE — Progress Notes (Signed)
Carol "Ann" Dillard, RN from 4N cardiac ICU/stepdown came to [facility] and pulled right groin sheath. No immediate complications. This action was taken based on my own objective and subjective assessment. Objectively, each time SBP was acceptable for sheath pull primary RN (myself) would attempt to position leg, reinforce instructions - each time pts blood pressure elevated to unacceptable pull level. NTG was titrated per order to decrease MAP. After several attempts at titration and sheath pull patient admits feeling uneasy due to circumstances of uncontrolled HTN, and as pt stated, "you need someone to be in here; you aren't old enough to have enough experience." the patient was showing classic s/s of anxiety and apprehension. At this time I reassured the patient and provided emotional support, also offering to call another unit for a more "seasonsedEngineer, production. At this time patient states he would like for there to be at least for there to be two RN's in the room, with one preferably "seasoned". As requested by patient this was coordinated. Patient tolerated very well and BP began to stabilize. During and s/p pull patient verbalizes a more clear understanding of BP control with NTG drip, sheath pull, and how they are connected.

## 2016-04-26 ENCOUNTER — Encounter (HOSPITAL_COMMUNITY): Payer: Self-pay | Admitting: Cardiology

## 2016-04-26 ENCOUNTER — Telehealth: Payer: Self-pay | Admitting: Cardiology

## 2016-04-26 NOTE — Telephone Encounter (Signed)
TOC for NSTEMI. May need to be at another office if unable to accomodate.   Scheduled to see Dr Domenic Polite in [facility] Nov 21 @2pm 

## 2016-04-26 NOTE — Progress Notes (Signed)
Cardiology Office Note  Date: 04/27/2016   ID: Billy [name], DOB [birth year], MRN [id number]  PCP: Curlene Labrum, MD  Primary Cardiologist: Rozann Lesches, MD   Chief Complaint  Patient presents with  . Hospitalization Follow-up  . Coronary Artery Disease    History of Present Illness: Billy [name] is a 81 y.o. male last seen in August. I reviewed interval records. He was recently admitted to [facility] with a NSTEMI (peak troponin I 52) and underwent cardiac catheterization that demonstrated extensive thrombosis involving the SVG to OM1 and OM 2. Two additional DES were placed within the graft by Dr. Ellyn Hack. He was treated with aspirin and Plavix. Complicating his history is prior GI bleeding this past summer necessitating a discontinuation of aspirin and Brilinta. He was only on aspirin as of our last visit in August.  I reviewed his medications in detail today with the patient and his wife. At recent hospital stay he was switched from Nexium to Pepcid, placed on high-dose Lipitor as well. He has had statin intolerance as already documented and will likely need to go back to Lipitor 10 mg daily after he completes the current bottle. LDL was 76.  He did not undergo repeat assessment of LVEF during recent hospital stay. Echocardiogram from April is outlined below. We discussed updating this in light of his significant troponin I elevation.  Past Medical History:  Diagnosis Date  . Carotid artery disease ([place])    33-82% LICA and 5-05% RICA - December 2013  . Coronary atherosclerosis of native coronary artery    Multivessel status post CABG, DES x 2 SVG to OM with staged DES SVG to RCA November 2015, DES to SVG to PDA 05/2015  . Essential hypertension, benign   . Gout   . HOH (hard of hearing)   . Sinus bradycardia    Asymptomatic    Past Surgical History:  Procedure Laterality Date  . APPENDECTOMY     Ruptured  . CARDIAC CATHETERIZATION N/A 05/19/2015   Procedure: Left  Heart Cath and Cors/Grafts Angiography;  Surgeon: Lorretta Harp, MD;  Location: [place] [facility];  Service: Cardiovascular;  Laterality: N/A;  . CARDIAC CATHETERIZATION N/A 05/19/2015   Procedure: Coronary Stent Intervention;  Surgeon: Lorretta Harp, MD;  Location: [place] [facility];  Service: Cardiovascular;  Laterality: N/A;  . CARDIAC CATHETERIZATION N/A 04/23/2016   Procedure: LEFT HEART CATH AND CORS/GRAFTS ANGIOGRAPHY;  Surgeon: Leonie Man, MD;  Location: [place] [facility];  Service: Cardiovascular;  Laterality: N/A;  . CARDIAC CATHETERIZATION N/A 04/23/2016   Procedure: Coronary Stent Intervention;  Surgeon: Leonie Man, MD;  Location: [place] [facility];  Service: Cardiovascular;  Laterality: N/A;  . CATARACT EXTRACTION W/PHACO Right 11/13/2012   Procedure: CATARACT EXTRACTION PHACO AND INTRAOCULAR LENS PLACEMENT (IOC);  Surgeon: Tonny Branch, MD;  Location: [facility];  Service: Ophthalmology;  Laterality: Right;  CDE:12.75  . CATARACT EXTRACTION W/PHACO Left 01/11/2013   Procedure: CATARACT EXTRACTION PHACO AND INTRAOCULAR LENS PLACEMENT (IOC);  Surgeon: Tonny Branch, MD;  Location: [facility];  Service: Ophthalmology;  Laterality: Left;  CDE: 21.13  . COLONOSCOPY  09/2013   Dr. Burke Keels: colitis descending colon and sigmoid colon.Bx ?Cdiff vs ischemic.  Marland Kitchen CORONARY ARTERY BYPASS GRAFT  11/09/1999   LIMA to LAD, SVG to diagonal, SVG to OM1 and OM2, SVG to PDA  . ENTEROSCOPY N/A 12/26/2015   Procedure: ENTEROSCOPY;  Surgeon: Daneil Dolin, MD;  [facility];  Service: Endoscopy;  Laterality: N/A;  . ESOPHAGOGASTRODUODENOSCOPY N/A 05/20/2015   Procedure: ESOPHAGOGASTRODUODENOSCOPY (EGD);  Surgeon: Wilford Corner, MD;  Location: [facility] ENDOSCOPY;  Service: Endoscopy;  Laterality: N/A;  . ESOPHAGOGASTRODUODENOSCOPY N/A 12/26/2015   Procedure: ESOPHAGOGASTRODUODENOSCOPY (EGD);  Surgeon: Daneil Dolin, MD;  [facility];  Service: Endoscopy;  Laterality: N/A;  . GIVENS  CAPSULE STUDY N/A 12/24/2015   Procedure: GIVENS CAPSULE STUDY;  Surgeon: Rogene Houston, MD;  [facility];  Service: Endoscopy;  Laterality: N/A;  . LEFT HEART CATHETERIZATION WITH CORONARY ANGIOGRAM N/A 04/22/2014   Procedure: LEFT HEART CATHETERIZATION WITH CORONARY ANGIOGRAM;  Surgeon: Burnell Blanks, MD;  Location: [facility] CATH LAB;  Service: Cardiovascular;  Laterality: N/A;  . PERCUTANEOUS CORONARY STENT INTERVENTION (PCI-S) N/A 04/25/2014   Procedure: PERCUTANEOUS CORONARY STENT INTERVENTION (PCI-S);  Surgeon: Peter M Martinique, MD;  Location: [place] CATH LAB;  Service: Cardiovascular;  Laterality: N/A;  . PERIPHERAL VASCULAR CATHETERIZATION  04/23/2016   Procedure: Thrombectomy;  Surgeon: Leonie Man, MD;  Location: [place] [facility];  Service: Cardiovascular;;    Current Outpatient Prescriptions  Medication Sig Dispense Refill  . albuterol (PROVENTIL HFA;VENTOLIN HFA) 108 (90 Base) MCG/ACT inhaler Inhale 1-2 puffs into the lungs every 6 (six) hours as needed for wheezing or shortness of breath.    Marland Kitchen aspirin EC 81 MG tablet Take 1 tablet (81 mg total) by mouth daily. 30 tablet 0  . COLCRYS 0.6 MG tablet Take 1 tablet by mouth Once daily as needed (for gout).     . cyclobenzaprine (FLEXERIL) 10 MG tablet Take 1 tablet (10 mg total) by mouth 3 times/day as needed-between meals & bedtime for muscle spasms. 30 tablet 0  . famotidine (PEPCID) 40 MG tablet Take 1 tablet (40 mg total) by mouth at bedtime. 30 tablet 0  . ferrous sulfate 325 (65 FE) MG tablet Take 1 tablet (325 mg total) by mouth 2 (two) times daily with a meal. 60 tablet 0  . fluticasone (FLOVENT HFA) 44 MCG/ACT inhaler Inhale 2 puffs into the lungs 2 (two) times daily.    . isosorbide mononitrate (IMDUR) 60 MG 24 hr tablet Take 0.5 tablets (30 mg total) by mouth daily. 45 tablet 3  . metoprolol tartrate (LOPRESSOR) 25 MG tablet Take 0.5 tablets (12.5 mg total) by mouth 2 (two) times daily. 30 tablet 0  .  nitroGLYCERIN (NITROSTAT) 0.4 MG SL tablet Place 1 tablet (0.4 mg total) under the tongue every 5 (five) minutes x 3 doses as needed. If no relief after 3rd dose, proceed to the ED for an evalutation 75 tablet 1  . tamsulosin (FLOMAX) 0.4 MG CAPS capsule Take 0.4 mg by mouth at bedtime.    Marland Kitchen atorvastatin (LIPITOR) 10 MG tablet Take 1 tablet (10 mg total) by mouth daily. 90 tablet 3  . clopidogrel (PLAVIX) 75 MG tablet Take 1 tablet (75 mg total) by mouth daily. 90 tablet 3   No current facility-administered medications for this visit.    Allergies:  Protonix [pantoprazole sodium]   Social History: The patient  reports that he quit smoking about 16 years ago. His smoking use included Cigarettes. He started smoking about 69 years ago. He has a 25.00 pack-year smoking history. He has never used smokeless tobacco. He reports that he does not drink alcohol or use drugs.   ROS:  Please see the history of present illness. Otherwise, complete review of systems is positive for none.  All other systems are reviewed and negative.  Physical Exam: VS:  BP 122/72   Pulse 85   Ht 5' 7.5" (1.715 m)   Wt 141 lb (64 kg)   SpO2 95%   BMI 21.76 kg/m , BMI Body mass index is 21.76 kg/m.  Wt Readings from Last 3 Encounters:  04/27/16 141 lb (64 kg)  04/23/16 148 lb 2.4 oz (67.2 kg)  01/19/16 146 lb (66.2 kg)    General: Patient appears comfortable at rest. HEENT: Conjunctiva and lids normal, oropharynx clear. Neck: Supple, no elevated JVP or carotid bruits, no thyromegaly. Lungs: Clear to auscultation, nonlabored breathing at rest. Cardiac: Regular rate and rhythm, no S3 or significant systolic murmur, no pericardial rub. Abdomen: Soft, nontender, bowel sounds present, no guarding or rebound. Extremities: Right groin arteriotomy site well healed with small area of resolving ecchymosis, no pitting edema, distal pulses 2+. Skin: Warm and dry. Musculoskeletal: No kyphosis. Neuropsychiatric: Alert and  oriented x3, affect grossly appropriate.  ECG: I personally reviewed the tracing from 04/24/2016 which showed sinus rhythm with low voltage, poor R-wave progression and nonspecific ST-T abnormalities.  Recent Labwork: 12/23/2015: Magnesium 2.1 04/22/2016: ALT 35; AST 26 04/23/2016: TSH 6.836 04/24/2016: BUN 13; Creatinine, Ser 0.99; Hemoglobin 12.7; Platelets 225; Potassium 4.3; Sodium 141     Component Value Date/Time   CHOL 121 04/23/2016 0346   TRIG 117 04/23/2016 0346   HDL 22 (L) 04/23/2016 0346   CHOLHDL 5.5 04/23/2016 0346   VLDL 23 04/23/2016 0346   LDLCALC 76 04/23/2016 0346    Other Studies Reviewed Today:  Cardiac catheterization 04/23/2016:  Native Vessels:  Mid RCA to Dist RCA lesion, 100 %stenosed.  Ost Cx to Prox Cx lesion, 95 %stenosed.  Prox Cx to Mid Cx lesion, 100 %stenosed.  Ost LAD to Prox LAD lesion, 80 %stenosed. The distal vessel fills via the LIMA graft.  LV end diastolic pressure is normal.  Graft Angiography:  LIMA-LAD is widely patent.  SVG-RPDA: Prox Graft to Mid Graft lesion, 45 %stenosed. Mid Graft to Dist Graft stent 0 %stenosed  SVG-DIAG: Prox Graft to Mid Graft lesion, 35 %stenosed.  CULPRIT LESION:  SVG-OM1-OM2: Prox Graft lesion, 90 %stenosed - heavy thrombosis involving the previously placed stent extending all the way to the initial anastomosis. Mid Graft to Dist Graft lesion, 100 %stenosed.  A STENT SYNERGY DES 3X28 drug eluting stent was successfully placed - in the more proximal segment extending to and overlaps previously placed stent.  A STENT SYNERGY DES 3X38 drug eluting stent was successfully placed just proximal to the initial anastomosis and extends proximally to overlap the previously placed stent.  Post intervention, there is a 0% residual stenosis - TIMI 3 flow restored both new stents were widely patent and the previously placed stent was treated with balloon angioplasty with a 3.0 mm noncompliant balloon.     Successful but very complicated PCI restoring TIMI-3 flow in the sequential SVG-OM1-OM 2. There was extensive thrombosis from the proximal graft all the way into both graft vessels.  2 new stents were placed overlapping the prior stent both proximally and distally.  Plan:  Transfer to 6 central postprocedure unit for ongoing care.  Continue Angiomax until current bag completed.  Continue IV nitroglycerin for blood pressure control and taper off overnight.  With the extensive amount of stent in 2 grafts now, I would recommend essentially lifelong Plavix and less absolutely has to stop. Could consider stopping aspirin after 3 months.  Would anticipate he would be able to discharge tomorrow if stable.  Echocardiogram  10/02/2015: Study Conclusions  - Left ventricle: The cavity size was at the upper limits of   normal. Wall thickness was normal. The estimated ejection   fraction was 55%. There is akinesis of the basal-midinferolateral   and inferior myocardium. Features are consistent with a   pseudonormal left ventricular filling pattern, with concomitant   abnormal relaxation and increased filling pressure (grade 2   diastolic dysfunction). - Aortic valve: Mildly calcified annulus. Trileaflet; mildly   calcified leaflets. There was trivial regurgitation. - Mitral valve: Mildly thickened leaflets . There was mild to   moderate regurgitation. - Left atrium: The atrium was moderately dilated. - Right atrium: Central venous pressure (est): 3 mm Hg. - Atrial septum: No defect or patent foramen ovale was identified. - Tricuspid valve: There was trivial regurgitation. - Pulmonary arteries: PA peak pressure: 36 mm Hg (S). - Pericardium, extracardiac: There was no pericardial effusion.  Impressions:  - Upper normal LV chamber size with normal wall thickness and LVEF   approximately 55%. Akinesis of the mid to basal inferoposterior   myocardium consistent with previous infarct. Grade 2  diastolic   dysfunction with increased LV filling pressure. Moderate left   atrial enlargement. Mildly thickened mitral leaflets with mild to   moderate mitral regurgitation. Mildly sclerotic aortic valve with   trivial aortic regurgitation. Trivial tricuspid regurgitation   with PASP estimated 36 mmHg.  Assessment and Plan:  1. Status post recent NSTEMI with troponin I elevation to 52. He underwent placement of 2 additional DES within the SVG to OM1 and OM 2 system in the setting of extensive thrombosis. Plan at this time is to update an echocardiogram to reassess LVEF. We will continue with present medical regimen including aspirin and Plavix. Dr. Allison Quarry note indicated plan for 3 months of DAPT and then switch to Plavix if possible. He will stay on Pepcid as well as with prior history of GI bleeding.  2. Essential hypertension, blood pressure is well controlled today.  3. Prior history of GI bleeding on DAPT. We discussed this today, to remain observant for any changes in stools. We will plan to check a surveillance hemoglobin eventually.  4. History of nonobstructive carotid artery disease, asymptomatic.  5. Hyperlipidemia with prior statin intolerance. When he finishes the bottle of Lipitor 80 mg daily, we will cut back to 10 mg daily which he did tolerate previously.  Current medicines were reviewed with the patient today.   Orders Placed This Encounter  Procedures  . ECHOCARDIOGRAM COMPLETE    Disposition: Keep office visit already scheduled for follow-up in [place] in December.  Signed, Satira Sark, MD, [facility] 04/27/2016 2:53 PM    [place] [facility] [address], [place], [place] [address] Phone: [phone]; Fax: [phone]

## 2016-04-26 NOTE — Telephone Encounter (Signed)
Called pt, spoke to pt's wife. She stated he is doing well, and has no problems. Reminded her of his appointment tomorrow @ 2 with Dr. Domenic Polite in [place] office.

## 2016-04-27 ENCOUNTER — Encounter: Payer: Self-pay | Admitting: Cardiology

## 2016-04-27 ENCOUNTER — Ambulatory Visit (INDEPENDENT_AMBULATORY_CARE_PROVIDER_SITE_OTHER): Payer: Medicare Other | Admitting: Cardiology

## 2016-04-27 VITALS — BP 122/72 | HR 85 | Ht 67.5 in | Wt 141.0 lb

## 2016-04-27 DIAGNOSIS — Z8719 Personal history of other diseases of the digestive system: Secondary | ICD-10-CM

## 2016-04-27 DIAGNOSIS — I25810 Atherosclerosis of coronary artery bypass graft(s) without angina pectoris: Secondary | ICD-10-CM

## 2016-04-27 DIAGNOSIS — I10 Essential (primary) hypertension: Secondary | ICD-10-CM | POA: Diagnosis not present

## 2016-04-27 DIAGNOSIS — E782 Mixed hyperlipidemia: Secondary | ICD-10-CM

## 2016-04-27 MED ORDER — CLOPIDOGREL BISULFATE 75 MG PO TABS: 75.0000 mg | ORAL_TABLET | Freq: Every day | ORAL | 3 refills | Status: DC

## 2016-04-27 MED ORDER — ATORVASTATIN CALCIUM 10 MG PO TABS: 10.0000 mg | ORAL_TABLET | Freq: Every day | ORAL | 3 refills | Status: DC

## 2016-04-27 MED ORDER — FAMOTIDINE 40 MG PO TABS: 40.0000 mg | ORAL_TABLET | Freq: Every day | ORAL | 0 refills | Status: DC

## 2016-04-27 NOTE — Patient Instructions (Addendum)
Medication Instructions:  Decrease Lipitor to 10 mg daily,  after you finish the 80 mg all up  I have refilled plavix , pepcid and sent in the new prescription for Lipitor 1o mg   Labwork: none  Testing/Procedures: Your physician has requested that you have an echocardiogram. Echocardiography is a painless test that uses sound waves to create images of your heart. It provides your doctor with information about the size and shape of your heart and how well your heart's chambers and valves are working. This procedure takes approximately one hour. There are no restrictions for this procedure.    Follow-Up: Keep the May 17, 2016 appointment   Any Other Special Instructions Will Be Listed Below (If Applicable).     If you need a refill on your cardiac medications before your next appointment, please call your pharmacy.

## 2016-05-05 ENCOUNTER — Ambulatory Visit (INDEPENDENT_AMBULATORY_CARE_PROVIDER_SITE_OTHER): Payer: Medicare Other

## 2016-05-05 ENCOUNTER — Other Ambulatory Visit: Payer: Self-pay

## 2016-05-05 DIAGNOSIS — I25810 Atherosclerosis of coronary artery bypass graft(s) without angina pectoris: Secondary | ICD-10-CM | POA: Diagnosis not present

## 2016-05-16 NOTE — Progress Notes (Signed)
Cardiology Office Note  Date: 05/17/2016   ID: Billy [name], DOB Aug 25, 1934, MRN [id number]  PCP: Curlene Labrum, MD  Primary Cardiologist: Rozann Lesches, MD   Chief Complaint  Patient presents with  . Coronary Artery Disease    History of Present Illness: Billy [name] is a 81 y.o. male seen recently in late November - see last note for full discussion of recent history. He is here today with his wife for a follow-up visit. No reported chest pain or obvious bleeding problems on current regimen.  Updated echocardiogram is outlined below showing normal LVEF. We discussed this today.  We discussed plan to continue ASA and Plavix for total of three months from DES then switch to Plavix alone. Also plan to recheck CBC.  Past Medical History:  Diagnosis Date  . Carotid artery disease ([place])    10-62% LICA and 6-94% RICA - December 2013  . Coronary atherosclerosis of native coronary artery    Multivessel status post CABG, DES x 2 SVG to OM with staged DES SVG to RCA November 2015, DES to SVG to PDA 05/2015  . Essential hypertension, benign   . Gout   . HOH (hard of hearing)   . Sinus bradycardia    Asymptomatic    Current Outpatient Prescriptions  Medication Sig Dispense Refill  . albuterol (PROVENTIL HFA;VENTOLIN HFA) 108 (90 Base) MCG/ACT inhaler Inhale 1-2 puffs into the lungs every 6 (six) hours as needed for wheezing or shortness of breath.    Marland Kitchen aspirin EC 81 MG tablet Take 1 tablet (81 mg total) by mouth daily. 30 tablet 0  . atorvastatin (LIPITOR) 10 MG tablet Take 1 tablet (10 mg total) by mouth daily. 90 tablet 3  . clopidogrel (PLAVIX) 75 MG tablet Take 1 tablet (75 mg total) by mouth daily. 90 tablet 3  . COLCRYS 0.6 MG tablet Take 1 tablet by mouth Once daily as needed (for gout).     . cyclobenzaprine (FLEXERIL) 10 MG tablet Take 1 tablet (10 mg total) by mouth 3 times/day as needed-between meals & bedtime for muscle spasms. 30 tablet 0  . famotidine (PEPCID)  40 MG tablet Take 1 tablet (40 mg total) by mouth at bedtime. 30 tablet 0  . ferrous sulfate 325 (65 FE) MG tablet Take 1 tablet (325 mg total) by mouth 2 (two) times daily with a meal. 60 tablet 0  . fluticasone (FLOVENT HFA) 44 MCG/ACT inhaler Inhale 2 puffs into the lungs 2 (two) times daily.    . isosorbide mononitrate (IMDUR) 60 MG 24 hr tablet Take 0.5 tablets (30 mg total) by mouth daily. 45 tablet 3  . metoprolol tartrate (LOPRESSOR) 25 MG tablet Take 0.5 tablets (12.5 mg total) by mouth 2 (two) times daily. 30 tablet 0  . nitroGLYCERIN (NITROSTAT) 0.4 MG SL tablet Place 1 tablet (0.4 mg total) under the tongue every 5 (five) minutes x 3 doses as needed. If no relief after 3rd dose, proceed to the ED for an evalutation 75 tablet 1  . tamsulosin (FLOMAX) 0.4 MG CAPS capsule Take 0.4 mg by mouth at bedtime.     No current facility-administered medications for this visit.    Allergies:  Protonix [pantoprazole sodium]   Social History: The patient  reports that he quit smoking about 16 years ago. His smoking use included Cigarettes. He started smoking about 69 years ago. He has a 25.00 pack-year smoking history. He has never used smokeless tobacco. He reports that he does not  drink alcohol or use drugs.   ROS:  Please see the history of present illness. Otherwise, complete review of systems is positive for mild orthostatic dizziness at times, no syncope.  All other systems are reviewed and negative.   Physical Exam: VS:  BP (!) 142/86   Pulse 86   Ht 5' 7.5" (1.715 m)   Wt 150 lb (68 kg)   SpO2 96%   BMI 23.15 kg/m , BMI Body mass index is 23.15 kg/m.  Wt Readings from Last 3 Encounters:  05/17/16 150 lb (68 kg)  04/27/16 141 lb (64 kg)  04/23/16 148 lb 2.4 oz (67.2 kg)    General: Patient appears comfortable at rest. HEENT: Conjunctiva and lids normal, oropharynx clear. Neck: Supple, no elevated JVP or carotid bruits, no thyromegaly. Lungs: Clear to auscultation, nonlabored  breathing at rest. Cardiac: Regular rate and rhythm, no S3 or significant systolic murmur, no pericardial rub. Abdomen: Soft, nontender, bowel sounds present, no guarding or rebound. Extremities: No pitting edema, distal pulses 2+.  ECG: I personally reviewed the tracing from 04/24/2016 which showed sinus rhythm with low voltage, poor R-wave progression and nonspecific ST-T abnormalities.  Recent Labwork: 12/23/2015: Magnesium 2.1 04/22/2016: ALT 35; AST 26 04/23/2016: TSH 6.836 04/24/2016: BUN 13; Creatinine, Ser 0.99; Hemoglobin 12.7; Platelets 225; Potassium 4.3; Sodium 141     Component Value Date/Time   CHOL 121 04/23/2016 0346   TRIG 117 04/23/2016 0346   HDL 22 (L) 04/23/2016 0346   CHOLHDL 5.5 04/23/2016 0346   VLDL 23 04/23/2016 0346   LDLCALC 76 04/23/2016 0346    Other Studies Reviewed Today:  Cardiac catheterization 04/23/2016:  Native Vessels:  Mid RCA to Dist RCA lesion, 100 %stenosed.  Ost Cx to Prox Cx lesion, 95 %stenosed.  Prox Cx to Mid Cx lesion, 100 %stenosed.  Ost LAD to Prox LAD lesion, 80 %stenosed. The distal vessel fills via the LIMA graft.  LV end diastolic pressure is normal.  Graft Angiography:  LIMA-LAD is widely patent.  SVG-RPDA: Prox Graft to Mid Graft lesion, 45 %stenosed. Mid Graft to Dist Graft stent 0 %stenosed  SVG-DIAG: Prox Graft to Mid Graft lesion, 35 %stenosed.  CULPRIT LESION:  SVG-OM1-OM2: Prox Graft lesion, 90 %stenosed - heavy thrombosis involving the previously placed stent extending all the way to the initial anastomosis. Mid Graft to Dist Graft lesion, 100 %stenosed.  A STENT SYNERGY DES 3X28 drug eluting stent was successfully placed - in the more proximal segment extending to and overlaps previously placed stent.  A STENT SYNERGY DES 3X38 drug eluting stent was successfully placed just proximal to the initial anastomosis and extends proximally to overlap the previously placed stent.  Post intervention, there is a  0% residual stenosis - TIMI 3 flow restored both new stents were widely patent and the previously placed stent was treated with balloon angioplasty with a 3.0 mm noncompliant balloon.  Successful but very complicated PCI restoring TIMI-3 flow in the sequential SVG-OM1-OM 2. There was extensive thrombosis from the proximal graft all the way into both graft vessels. 2 new stents were placed overlapping the prior stent both proximally and distally.  Plan:  Transfer to 6 central postprocedure unit for ongoing care.  Continue Angiomax until current bag completed.  Continue IV nitroglycerin for blood pressure control and taper off overnight.  With the extensive amount of stent in 2 grafts now, I would recommend essentially lifelong Plavix and less absolutely has to stop. Could consider stopping aspirin after 3 months.  Would  anticipate he would be able to discharge tomorrow if stable.  Echocardiogram 05/05/2016: Study Conclusions  - Left ventricle: The cavity size was normal. Wall thickness was   increased in a pattern of moderate LVH. Systolic function was   normal. The estimated ejection fraction was in the range of 55%   to 60%. Wall motion was normal; there were no regional wall   motion abnormalities. Doppler parameters are consistent with   abnormal left ventricular relaxation (grade 1 diastolic   dysfunction). - Aortic valve: Mildly calcified annulus. Trileaflet; mildly   thickened leaflets. There was trivial regurgitation. Valve area   (VTI): 2.48 cm^2. Valve area (Vmax): 2.48 cm^2. Valve area   (Vmean): 2.34 cm^2. - Mitral valve: Mildly calcified annulus. Mildly thickened leaflets   . There was mild regurgitation. - Left atrium: The atrium was mildly dilated. - Technically adequate study.  Assessment and Plan:  1. Symptomatically stable CAD with graft disease, recently status post DES 2 to the SVG to OM1 and OM 2 recently due to extensive thrombosis and NSTEMI. Follow-up  echocardiogram shows normal LVEF. Plan to continue medical therapy. He will stay on aspirin and Plavix for 3 months out from the intervention and then switch to Plavix if possible with prior history of GI bleeding. Also remain on Pepcid.  2. Essential hypertension, no changes made present regimen.  3. History of statin intolerance, he did tolerate Lipitor 10 mg previously and we have cut him back to this dose.  Current medicines were reviewed with the patient today.  Disposition: Follow-up in 2 months.  Signed, Satira Sark, MD, [facility] 05/17/2016 11:50 AM    [place] [facility] [place], [place], [place] [address] Phone: [phone]; Fax: [phone]

## 2016-05-17 ENCOUNTER — Encounter: Payer: Self-pay | Admitting: Cardiology

## 2016-05-17 ENCOUNTER — Ambulatory Visit (INDEPENDENT_AMBULATORY_CARE_PROVIDER_SITE_OTHER): Payer: Medicare Other | Admitting: Cardiology

## 2016-05-17 VITALS — BP 142/86 | HR 86 | Ht 67.5 in | Wt 150.0 lb

## 2016-05-17 DIAGNOSIS — E782 Mixed hyperlipidemia: Secondary | ICD-10-CM

## 2016-05-17 DIAGNOSIS — I10 Essential (primary) hypertension: Secondary | ICD-10-CM | POA: Diagnosis not present

## 2016-05-17 DIAGNOSIS — I25810 Atherosclerosis of coronary artery bypass graft(s) without angina pectoris: Secondary | ICD-10-CM

## 2016-05-17 NOTE — Patient Instructions (Signed)
Your physician recommends that you schedule a follow-up appointment in: 2 MONTHS WITH DR MCDOWELL  Your physician recommends that you continue on your current medications as directed. Please refer to the Current Medication list given to you today.  Thank you for choosing [facility]!!    

## 2016-05-23 DIAGNOSIS — I77811 Abdominal aortic ectasia: Secondary | ICD-10-CM | POA: Diagnosis not present

## 2016-05-23 DIAGNOSIS — I214 Non-ST elevation (NSTEMI) myocardial infarction: Secondary | ICD-10-CM | POA: Diagnosis not present

## 2016-05-23 DIAGNOSIS — D500 Iron deficiency anemia secondary to blood loss (chronic): Secondary | ICD-10-CM | POA: Diagnosis not present

## 2016-05-23 DIAGNOSIS — M1A9XX1 Chronic gout, unspecified, with tophus (tophi): Secondary | ICD-10-CM | POA: Diagnosis not present

## 2016-05-23 DIAGNOSIS — I25812 Atherosclerosis of bypass graft of coronary artery of transplanted heart without angina pectoris: Secondary | ICD-10-CM | POA: Diagnosis not present

## 2016-06-14 IMAGING — DX DG CHEST 2V
2 series · 2 of 2 positions shown · non-contrast
Comparison: April 22, 2014.

CLINICAL DATA: Chest pain.

EXAM:
CHEST  2 VIEW

[chest pa]
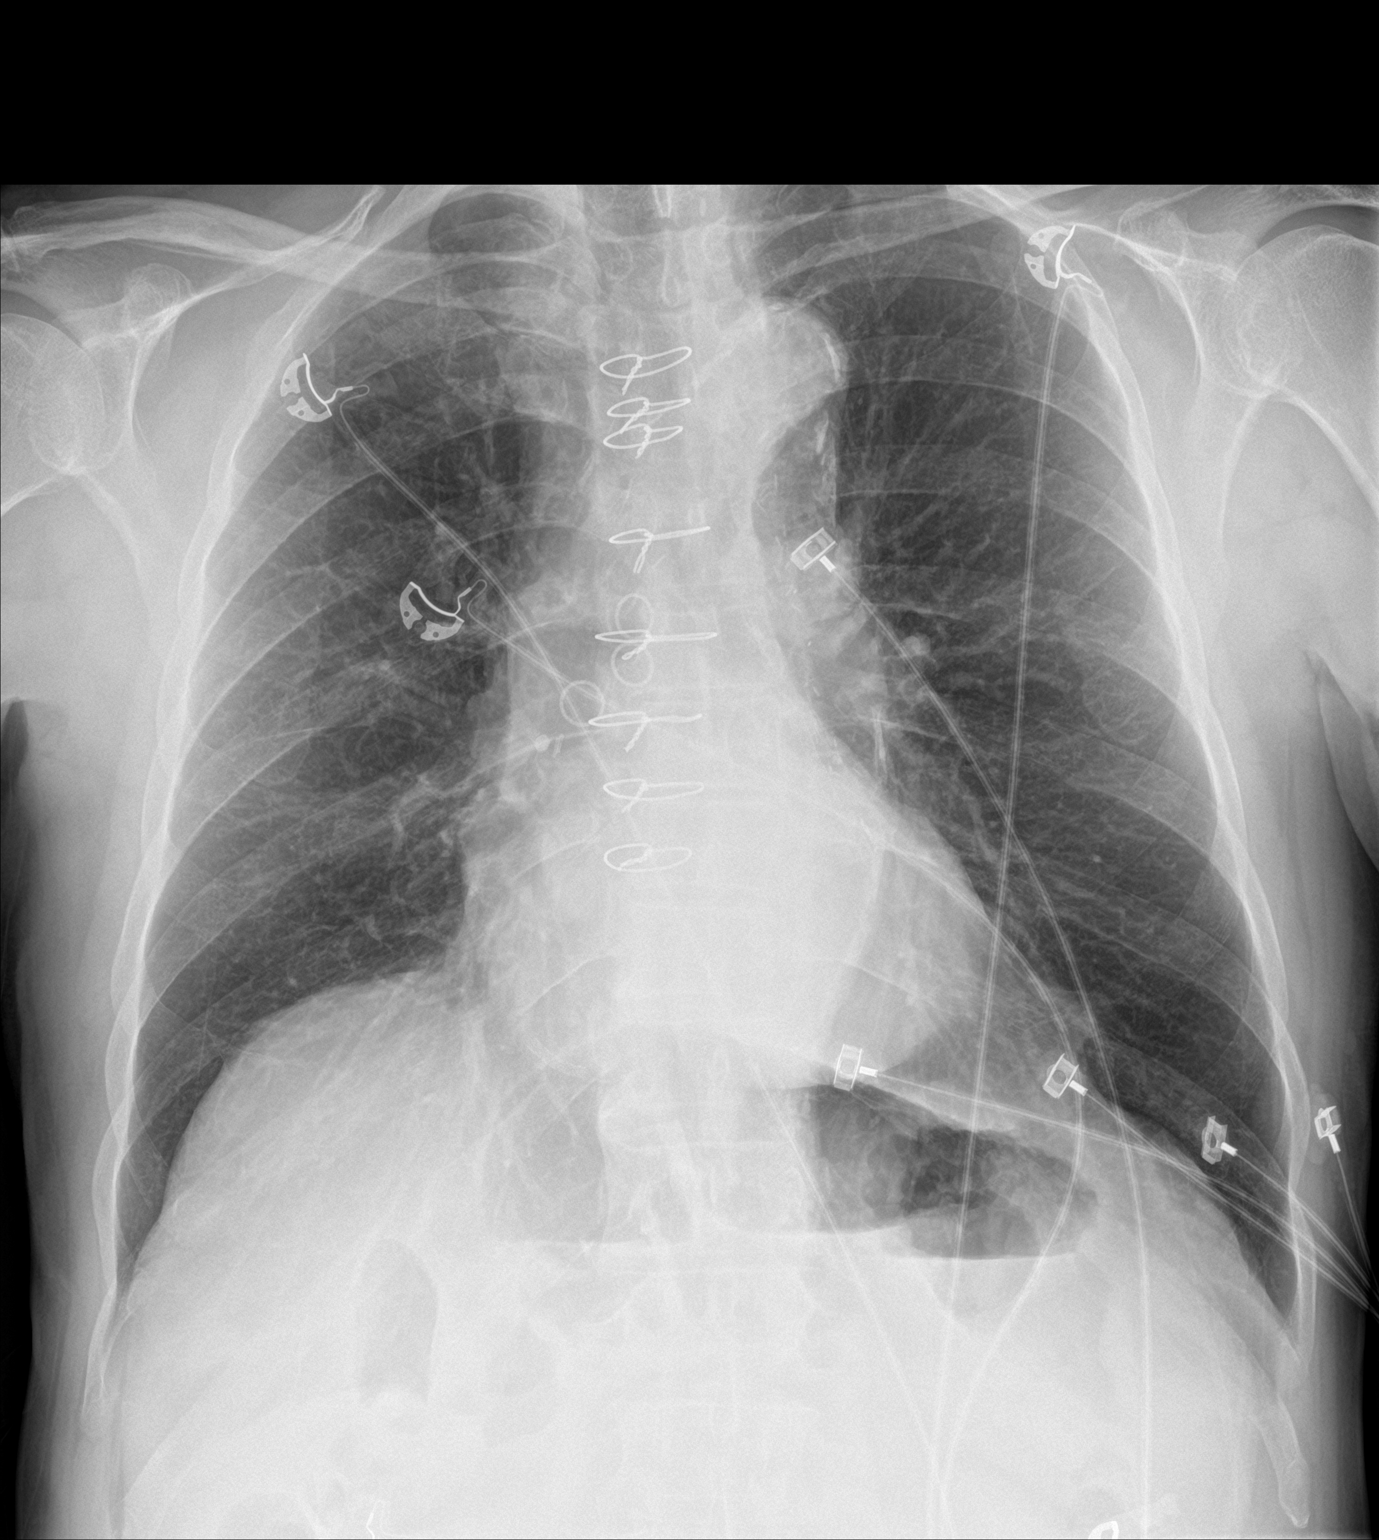

[chest lat]
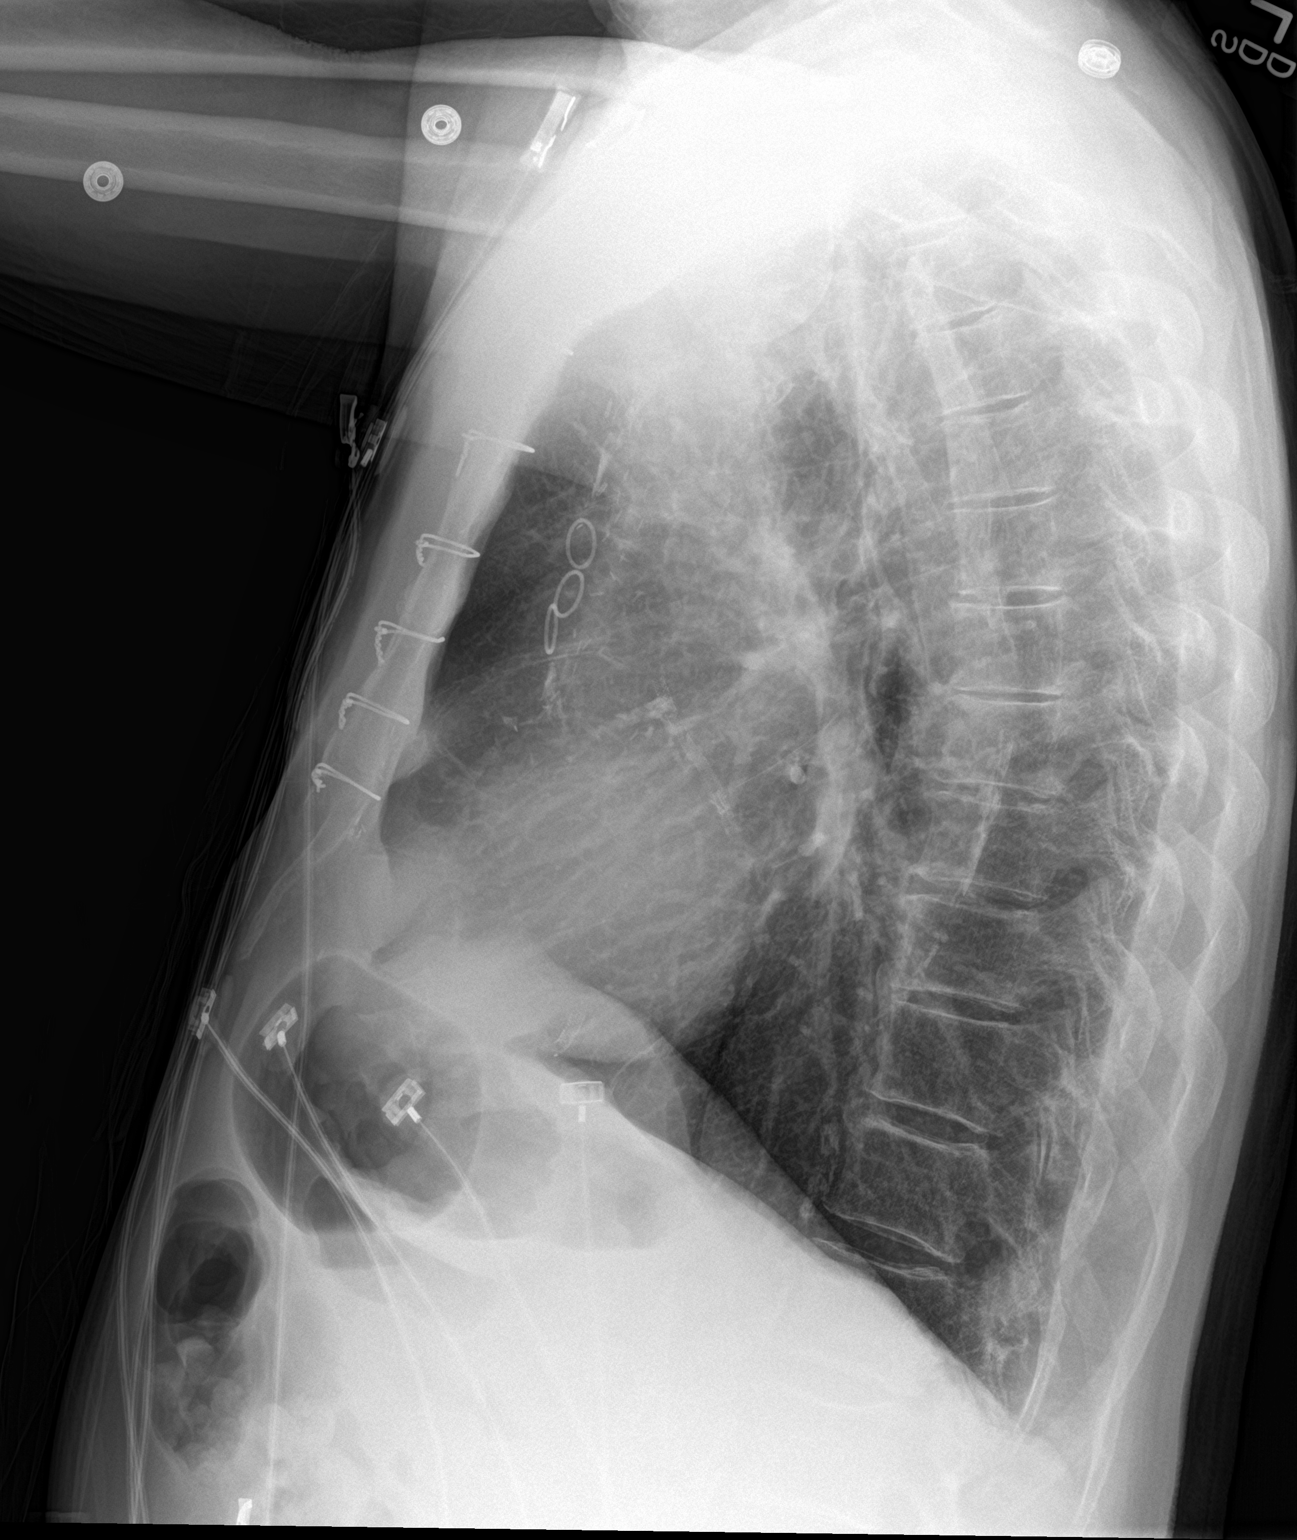

[2 of 2 positions shown; findings below may reference images not displayed]

FINDINGS: The heart size and mediastinal contours are within normal limits.
Both lungs are clear. No pneumothorax or pleural effusion is noted.
Status post coronary artery bypass graft. Atherosclerosis of
descending thoracic aorta is noted. The visualized skeletal
structures are unremarkable.
IMPRESSION: No active cardiopulmonary disease.

## 2016-06-20 ENCOUNTER — Other Ambulatory Visit: Payer: Self-pay | Admitting: Cardiology

## 2016-06-28 ENCOUNTER — Other Ambulatory Visit: Payer: Self-pay | Admitting: *Deleted

## 2016-06-28 ENCOUNTER — Other Ambulatory Visit: Payer: Self-pay | Admitting: Cardiology

## 2016-06-28 MED ORDER — FAMOTIDINE 40 MG PO TABS: 40.0000 mg | ORAL_TABLET | Freq: Every day | ORAL | 3 refills | Status: DC

## 2016-06-28 MED ORDER — FAMOTIDINE 40 MG PO TABS: 40.0000 mg | ORAL_TABLET | Freq: Every day | ORAL | 6 refills | Status: DC

## 2016-06-28 NOTE — Telephone Encounter (Signed)
Rx already sent to [facility] this morning.  Will send script to local pharm now.   Patient made aware via voice mail.

## 2016-06-28 NOTE — Telephone Encounter (Signed)
Refill:  Needs Famotidine 40 mg.  He is out of medication. Please call [facility]  And send to [facility]

## 2016-07-08 ENCOUNTER — Telehealth: Payer: Self-pay | Admitting: Cardiology

## 2016-07-08 NOTE — Telephone Encounter (Signed)
No answer at home number or mobile number.

## 2016-07-08 NOTE — Telephone Encounter (Signed)
Pt's wife called stating Billy [name] received a letter in the mail from [place] him that his clopidogrel (PLAVIX) 75 MG tablet [[id number]]  Has been recalled, she would like to know what he needs to do about this and would like someone to give her a call ASAP

## 2016-07-09 NOTE — Telephone Encounter (Signed)
Left message to return call 

## 2016-07-15 NOTE — Telephone Encounter (Signed)
Numerous attempts made to contact patient.  Left information on voice mail.  Advised to call pharmacy where he received the medication from as they should have the lot numbers on file & may be able to give more specific information about his medication.

## 2016-07-22 DIAGNOSIS — I10 Essential (primary) hypertension: Secondary | ICD-10-CM | POA: Diagnosis not present

## 2016-07-22 DIAGNOSIS — E782 Mixed hyperlipidemia: Secondary | ICD-10-CM | POA: Diagnosis not present

## 2016-07-22 DIAGNOSIS — D649 Anemia, unspecified: Secondary | ICD-10-CM | POA: Diagnosis not present

## 2016-07-26 DIAGNOSIS — I7102 Dissection of abdominal aorta: Secondary | ICD-10-CM | POA: Diagnosis not present

## 2016-07-26 DIAGNOSIS — K264 Chronic or unspecified duodenal ulcer with hemorrhage: Secondary | ICD-10-CM | POA: Diagnosis not present

## 2016-07-26 DIAGNOSIS — Z1389 Encounter for screening for other disorder: Secondary | ICD-10-CM | POA: Diagnosis not present

## 2016-07-26 DIAGNOSIS — I77811 Abdominal aortic ectasia: Secondary | ICD-10-CM | POA: Diagnosis not present

## 2016-07-26 DIAGNOSIS — I25812 Atherosclerosis of bypass graft of coronary artery of transplanted heart without angina pectoris: Secondary | ICD-10-CM | POA: Diagnosis not present

## 2016-07-26 DIAGNOSIS — J449 Chronic obstructive pulmonary disease, unspecified: Secondary | ICD-10-CM | POA: Diagnosis not present

## 2016-07-26 DIAGNOSIS — I10 Essential (primary) hypertension: Secondary | ICD-10-CM | POA: Diagnosis not present

## 2016-07-27 NOTE — Progress Notes (Signed)
Cardiology Office Note  Date: 07/28/2016   ID: Billy [name], DOB 08-29-1934, MRN [id number]  PCP: Curlene Labrum, MD  Primary Cardiologist: Rozann Lesches, MD   Chief Complaint  Patient presents with  . Coronary Artery Disease    History of Present Illness: Billy [name] is a 82 y.o. male last seen in December 2017. He presents for a routine follow-up visit, doing well without any significant angina symptoms. He states that he has felt progressively better over the last month. No obvious bleeding problems or changes in his stool. He just had recent follow-up lab work with Dr. Pleas Koch, I reviewed it today. His hemoglobin has come up to 13.6. Lipids are well controlled.  Most recent DES intervention was in November 2017 as outlined below. Plan was to continue ASA and Plavix for total of three months from DES then switch to Plavix alone. We have discussed stopping aspirin at this point.  I reviewed his cardiac regimen which will now include Plavix, Lipitor, Imdur, Lopressor, and as needed nitroglycerin.  Past Medical History:  Diagnosis Date  . Carotid artery disease ([place])    24-58% LICA and 0-99% RICA - December 2013  . Coronary atherosclerosis of native coronary artery    Multivessel status post CABG, DES x 2 SVG to OM with staged DES SVG to RCA November 2015, DES to SVG to PDA 05/2015  . Essential hypertension, benign   . Gout   . HOH (hard of hearing)   . Sinus bradycardia    Asymptomatic    Past Surgical History:  Procedure Laterality Date  . APPENDECTOMY     Ruptured  . CARDIAC CATHETERIZATION N/A 05/19/2015   Procedure: Left Heart Cath and Cors/Grafts Angiography;  Surgeon: Lorretta Harp, MD;  Location: [place] [facility];  Service: Cardiovascular;  Laterality: N/A;  . CARDIAC CATHETERIZATION N/A 05/19/2015   Procedure: Coronary Stent Intervention;  Surgeon: Lorretta Harp, MD;  Location: [place] [facility];  Service: Cardiovascular;  Laterality: N/A;  .  CARDIAC CATHETERIZATION N/A 04/23/2016   Procedure: LEFT HEART CATH AND CORS/GRAFTS ANGIOGRAPHY;  Surgeon: Leonie Man, MD;  Location: [place] [facility];  Service: Cardiovascular;  Laterality: N/A;  . CARDIAC CATHETERIZATION N/A 04/23/2016   Procedure: Coronary Stent Intervention;  Surgeon: Leonie Man, MD;  Location: [place] [facility];  Service: Cardiovascular;  Laterality: N/A;  . CATARACT EXTRACTION W/PHACO Right 11/13/2012   Procedure: CATARACT EXTRACTION PHACO AND INTRAOCULAR LENS PLACEMENT (IOC);  Surgeon: Tonny Branch, MD;  Location: [facility];  Service: Ophthalmology;  Laterality: Right;  CDE:12.75  . CATARACT EXTRACTION W/PHACO Left 01/11/2013   Procedure: CATARACT EXTRACTION PHACO AND INTRAOCULAR LENS PLACEMENT (IOC);  Surgeon: Tonny Branch, MD;  Location: [facility];  Service: Ophthalmology;  Laterality: Left;  CDE: 21.13  . COLONOSCOPY  09/2013   Dr. Burke Keels: colitis descending colon and sigmoid colon.Bx ?Cdiff vs ischemic.  Marland Kitchen CORONARY ARTERY BYPASS GRAFT  11/09/1999   LIMA to LAD, SVG to diagonal, SVG to OM1 and OM2, SVG to PDA  . ENTEROSCOPY N/A 12/26/2015   Procedure: ENTEROSCOPY;  Surgeon: Daneil Dolin, MD;  [facility];  Service: Endoscopy;  Laterality: N/A;  . ESOPHAGOGASTRODUODENOSCOPY N/A 05/20/2015   Procedure: ESOPHAGOGASTRODUODENOSCOPY (EGD);  Surgeon: Wilford Corner, MD;  Location: [facility] ENDOSCOPY;  Service: Endoscopy;  Laterality: N/A;  . ESOPHAGOGASTRODUODENOSCOPY N/A 12/26/2015   Procedure: ESOPHAGOGASTRODUODENOSCOPY (EGD);  Surgeon: Daneil Dolin, MD;  [facility];  Service: Endoscopy;  Laterality: N/A;  .  GIVENS CAPSULE STUDY N/A 12/24/2015   Procedure: GIVENS CAPSULE STUDY;  Surgeon: Rogene Houston, MD;  [facility];  Service: Endoscopy;  Laterality: N/A;  . LEFT HEART CATHETERIZATION WITH CORONARY ANGIOGRAM N/A 04/22/2014   Procedure: LEFT HEART CATHETERIZATION WITH CORONARY ANGIOGRAM;  Surgeon: Burnell Blanks, MD;   Location: [facility] LAB;  Service: Cardiovascular;  Laterality: N/A;  . PERCUTANEOUS CORONARY STENT INTERVENTION (PCI-S) N/A 04/25/2014   Procedure: PERCUTANEOUS CORONARY STENT INTERVENTION (PCI-S);  Surgeon: Peter M Martinique, MD;  Location: [facility] C[facility] LAB;  Service: Cardiovascular;  Laterality: N/A;  . PERIPHERAL VASCULAR CATHETERIZATION  04/23/2016   Procedure: Thrombectomy;  Surgeon: Leonie Man, MD;  Location: [place] [facility];  Service: Cardiovascular;;    Current Outpatient Prescriptions  Medication Sig Dispense Refill  . albuterol (PROVENTIL HFA;VENTOLIN HFA) 108 (90 Base) MCG/ACT inhaler Inhale 1-2 puffs into the lungs every 6 (six) hours as needed for wheezing or shortness of breath.    . clopidogrel (PLAVIX) 75 MG tablet Take 1 tablet (75 mg total) by mouth daily. 90 tablet 3  . COLCRYS 0.6 MG tablet Take 1 tablet by mouth Once daily as needed (for gout).     . cyclobenzaprine (FLEXERIL) 10 MG tablet Take 1 tablet (10 mg total) by mouth 3 times/day as needed-between meals & bedtime for muscle spasms. 30 tablet 0  . famotidine (PEPCID) 40 MG tablet Take 1 tablet (40 mg total) by mouth at bedtime. 30 tablet 6  . ferrous sulfate 325 (65 FE) MG tablet Take 325 mg by mouth every other day.    . fluticasone (FLOVENT HFA) 44 MCG/ACT inhaler Inhale 2 puffs into the lungs 2 (two) times daily.    . isosorbide mononitrate (IMDUR) 60 MG 24 hr tablet TAKE ONE-HALF TABLET BY  MOUTH DAILY 45 tablet 6  . metoprolol tartrate (LOPRESSOR) 25 MG tablet Take 0.5 tablets (12.5 mg total) by mouth 2 (two) times daily. 30 tablet 0  . nitroGLYCERIN (NITROSTAT) 0.4 MG SL tablet Place 1 tablet (0.4 mg total) under the tongue every 5 (five) minutes x 3 doses as needed. If no relief after 3rd dose, proceed to the ED for an evalutation 75 tablet 1  . tamsulosin (FLOMAX) 0.4 MG CAPS capsule Take 0.4 mg by mouth at bedtime.    Marland Kitchen atorvastatin (LIPITOR) 10 MG tablet Take 1 tablet (10 mg total) by mouth daily. 90 tablet 3     No current facility-administered medications for this visit.    Allergies:  Protonix [pantoprazole sodium]   Social History: The patient  reports that he quit smoking about 17 years ago. His smoking use included Cigarettes. He started smoking about 69 years ago. He has a 25.00 pack-year smoking history. He has never used smokeless tobacco. He reports that he does not drink alcohol or use drugs.   ROS:  Please see the history of present illness. Otherwise, complete review of systems is positive for none.  All other systems are reviewed and negative.   Physical Exam: VS:  BP 109/60   Pulse (!) 55   Ht 5' 7.5" (1.715 m)   Wt 142 lb 3.2 oz (64.5 kg)   SpO2 98%   BMI 21.94 kg/m , BMI Body mass index is 21.94 kg/m.  Wt Readings from Last 3 Encounters:  07/28/16 142 lb 3.2 oz (64.5 kg)  05/17/16 150 lb (68 kg)  04/27/16 141 lb (64 kg)    General: Patient appears comfortable at rest. HEENT: Conjunctiva and lids normal,  oropharynx clear. Neck: Supple, no elevated JVP or carotid bruits, no thyromegaly. Lungs: Clear to auscultation, nonlabored breathing at rest. Cardiac: Regular rate and rhythm, no S3 or significant systolic murmur, no pericardial rub. Abdomen: Soft, nontender, bowel sounds present, no guarding or rebound. Extremities: No pitting edema, distal pulses 2+. Skin: Warm and dry. Musculoskeletal: No kyphosis. Neuropsychiatric: Alert and oriented 3, affect appropriate.  ECG: I personally reviewed the tracing from 04/24/2016 which showed sinus rhythm with low voltage, poor R-wave progression and nonspecific ST-T abnormalities.  Recent Labwork: 12/23/2015: Magnesium 2.1 04/22/2016: ALT 35; AST 26 04/23/2016: TSH 6.836 04/24/2016: BUN 13; Creatinine, Ser 0.99; Hemoglobin 12.7; Platelets 225; Potassium 4.3; Sodium 141     Component Value Date/Time   CHOL 121 04/23/2016 0346   TRIG 117 04/23/2016 0346   HDL 22 (L) 04/23/2016 0346   CHOLHDL 5.5 04/23/2016 0346   VLDL 23  04/23/2016 0346   LDLCALC 76 04/23/2016 0346    Other Studies Reviewed Today:  Cardiac catheterization 04/23/2016:  Native Vessels:  Mid RCA to Dist RCA lesion, 100 %stenosed.  Ost Cx to Prox Cx lesion, 95 %stenosed.  Prox Cx to Mid Cx lesion, 100 %stenosed.  Ost LAD to Prox LAD lesion, 80 %stenosed. The distal vessel fills via the LIMA graft.  LV end diastolic pressure is normal.  Graft Angiography:  LIMA-LAD is widely patent.  SVG-RPDA: Prox Graft to Mid Graft lesion, 45 %stenosed. Mid Graft to Dist Graft stent 0 %stenosed  SVG-DIAG: Prox Graft to Mid Graft lesion, 35 %stenosed.  CULPRIT LESION:  SVG-OM1-OM2: Prox Graft lesion, 90 %stenosed - heavy thrombosis involving the previously placed stent extending all the way to the initial anastomosis. Mid Graft to Dist Graft lesion, 100 %stenosed.  A STENT SYNERGY DES 3X28 drug eluting stent was successfully placed - in the more proximal segment extending to and overlaps previously placed stent.  A STENT SYNERGY DES 3X38 drug eluting stent was successfully placed just proximal to the initial anastomosis and extends proximally to overlap the previously placed stent.  Post intervention, there is a 0% residual stenosis - TIMI 3 flow restored both new stents were widely patent and the previously placed stent was treated with balloon angioplasty with a 3.0 mm noncompliant balloon.  Successful but very complicated PCI restoring TIMI-3 flow in the sequential SVG-OM1-OM 2. There was extensive thrombosis from the proximal graft all the way into both graft vessels. 2 new stents were placed overlapping the prior stent both proximally and distally.  Echocardiogram 05/05/2016: Study Conclusions  - Left ventricle: The cavity size was normal. Wall thickness was increased in a pattern of moderate LVH. Systolic function was normal. The estimated ejection fraction was in the range of 55% to 60%. Wall motion was normal; there were no  regional wall motion abnormalities. Doppler parameters are consistent with abnormal left ventricular relaxation (grade 1 diastolic dysfunction). - Aortic valve: Mildly calcified annulus. Trileaflet; mildly thickened leaflets. There was trivial regurgitation. Valve area (VTI): 2.48 cm^2. Valve area (Vmax): 2.48 cm^2. Valve area (Vmean): 2.34 cm^2. - Mitral valve: Mildly calcified annulus. Mildly thickened leaflets . There was mild regurgitation. - Left atrium: The atrium was mildly dilated. - Technically adequate study.  Assessment and Plan:  1. CAD status post CABG with graft disease, status post DES 2 to the SVG to OM1 and OM 2 in November 2017. He is doing well now without angina symptoms on medical therapy. Planning to stop aspirin as previously arranged in light of history of recurrent GI bleeding,  try to stay on Plavix going forward.  2. History of GI bleed with symptomatic anemia. Hemoglobin has normalized, recently 13.6.  3. Essential hypertension, blood pressure is well controlled today.  4. Hyperlipidemia, currently tolerating low-dose Lipitor with good lipid control.  Current medicines were reviewed with the patient today.  Disposition: Follow-up in 4 months.  Signed, Satira Sark, MD, [facility] 07/28/2016 11:34 AM    [place] at [place], [place], [place] [address] Phone: [phone]; Fax: [phone]

## 2016-07-28 ENCOUNTER — Ambulatory Visit (INDEPENDENT_AMBULATORY_CARE_PROVIDER_SITE_OTHER): Payer: Medicare Other | Admitting: Cardiology

## 2016-07-28 ENCOUNTER — Encounter: Payer: Self-pay | Admitting: Cardiology

## 2016-07-28 VITALS — BP 109/60 | HR 55 | Ht 67.5 in | Wt 142.2 lb

## 2016-07-28 DIAGNOSIS — Z8719 Personal history of other diseases of the digestive system: Secondary | ICD-10-CM | POA: Diagnosis not present

## 2016-07-28 DIAGNOSIS — E782 Mixed hyperlipidemia: Secondary | ICD-10-CM | POA: Diagnosis not present

## 2016-07-28 DIAGNOSIS — I25810 Atherosclerosis of coronary artery bypass graft(s) without angina pectoris: Secondary | ICD-10-CM

## 2016-07-28 DIAGNOSIS — I10 Essential (primary) hypertension: Secondary | ICD-10-CM | POA: Diagnosis not present

## 2016-07-28 NOTE — Patient Instructions (Signed)
Your physician recommends that you schedule a follow-up appointment in: 4 months with Dr. Domenic Polite.   Your physician has recommended you make the following change in your medication:  Stop Aspirin    Thank you for choosing [facility]!!

## 2016-09-23 DIAGNOSIS — H43391 Other vitreous opacities, right eye: Secondary | ICD-10-CM | POA: Diagnosis not present

## 2016-09-24 DIAGNOSIS — M25561 Pain in right knee: Secondary | ICD-10-CM | POA: Diagnosis not present

## 2016-09-24 DIAGNOSIS — M25562 Pain in left knee: Secondary | ICD-10-CM | POA: Diagnosis not present

## 2016-09-29 DIAGNOSIS — M26602 Left temporomandibular joint disorder, unspecified: Secondary | ICD-10-CM | POA: Diagnosis not present

## 2016-10-15 DIAGNOSIS — M1000 Idiopathic gout, unspecified site: Secondary | ICD-10-CM | POA: Diagnosis not present

## 2016-10-15 DIAGNOSIS — R5383 Other fatigue: Secondary | ICD-10-CM | POA: Diagnosis not present

## 2016-10-15 DIAGNOSIS — D649 Anemia, unspecified: Secondary | ICD-10-CM | POA: Diagnosis not present

## 2016-10-25 DIAGNOSIS — I25812 Atherosclerosis of bypass graft of coronary artery of transplanted heart without angina pectoris: Secondary | ICD-10-CM | POA: Diagnosis not present

## 2016-10-25 DIAGNOSIS — I10 Essential (primary) hypertension: Secondary | ICD-10-CM | POA: Diagnosis not present

## 2016-10-25 DIAGNOSIS — D500 Iron deficiency anemia secondary to blood loss (chronic): Secondary | ICD-10-CM | POA: Diagnosis not present

## 2016-10-25 DIAGNOSIS — E782 Mixed hyperlipidemia: Secondary | ICD-10-CM | POA: Diagnosis not present

## 2016-10-25 DIAGNOSIS — M1A9XX1 Chronic gout, unspecified, with tophus (tophi): Secondary | ICD-10-CM | POA: Diagnosis not present

## 2016-11-16 NOTE — Progress Notes (Signed)
Cardiology Office Note  Date: 11/18/2016   ID: [name], DOB [birth year], MRN [id number]  PCP: Curlene Labrum, MD  Primary Cardiologist: Rozann Lesches, MD   Chief Complaint  Patient presents with  . Coronary Artery Disease    History of Present Illness: Billy [name] is a 82 y.o. male last seen in February. He presents today with his wife for a follow-up visit. Since last encounter he has been stable from a cardiac perspective, no angina symptoms or nitroglycerin use. He has been working in his garden and also project shed.  He has a history of CAD status post CABG with graft disease, status post DES 2 to the SVG to OM1 and OM 2 in November 2017. He continues on medical therapy as outlined below.  Past Medical History:  Diagnosis Date  . Carotid artery disease ([place])    12-45% LICA and 8-09% RICA - December 2013  . Coronary atherosclerosis of native coronary artery    Multivessel status post CABG, DES x 2 SVG to OM with staged DES SVG to RCA November 2015, DES to SVG to PDA 05/2015  . Essential hypertension, benign   . Gout   . HOH (hard of hearing)   . Sinus bradycardia    Asymptomatic    Past Surgical History:  Procedure Laterality Date  . APPENDECTOMY     Ruptured  . CARDIAC CATHETERIZATION N/A 05/19/2015   Procedure: Left Heart Cath and Cors/Grafts Angiography;  Surgeon: Lorretta Harp, MD;  Location: [place] [facility];  Service: Cardiovascular;  Laterality: N/A;  . CARDIAC CATHETERIZATION N/A 05/19/2015   Procedure: Coronary Stent Intervention;  Surgeon: Lorretta Harp, MD;  Location: [place] [facility];  Service: Cardiovascular;  Laterality: N/A;  . CARDIAC CATHETERIZATION N/A 04/23/2016   Procedure: LEFT HEART CATH AND CORS/GRAFTS ANGIOGRAPHY;  Surgeon: Leonie Man, MD;  Location: [place] [facility];  Service: Cardiovascular;  Laterality: N/A;  . CARDIAC CATHETERIZATION N/A 04/23/2016   Procedure: Coronary Stent Intervention;  Surgeon: Leonie Man, MD;  Location: [place] [facility];  Service: Cardiovascular;  Laterality: N/A;  . CATARACT EXTRACTION W/PHACO Right 11/13/2012   Procedure: CATARACT EXTRACTION PHACO AND INTRAOCULAR LENS PLACEMENT (IOC);  Surgeon: Tonny Branch, MD;  [facility]: [facility];  Service: Ophthalmology;  Laterality: Right;  CDE:12.75  . CATARACT EXTRACTION W/PHACO Left 01/11/2013   Procedure: CATARACT EXTRACTION PHACO AND INTRAOCULAR LENS PLACEMENT (IOC);  Surgeon: Tonny Branch, MD;  Location: [facility];  Service: Ophthalmology;  Laterality: Left;  CDE: 21.13  . COLONOSCOPY  09/2013   Dr. Burke Keels: colitis descending colon and sigmoid colon.Bx ?Cdiff vs ischemic.  Marland Kitchen CORONARY ARTERY BYPASS GRAFT  11/09/1999   LIMA to LAD, SVG to diagonal, SVG to OM1 and OM2, SVG to PDA  . ENTEROSCOPY N/A 12/26/2015   Procedure: ENTEROSCOPY;  Surgeon: Daneil Dolin, MD;  [facility];  Service: Endoscopy;  Laterality: N/A;  . ESOPHAGOGASTRODUODENOSCOPY N/A 05/20/2015   Procedure: ESOPHAGOGASTRODUODENOSCOPY (EGD);  Surgeon: Wilford Corner, MD;  Location: [facility] ENDOSCOPY;  Service: Endoscopy;  Laterality: N/A;  . ESOPHAGOGASTRODUODENOSCOPY N/A 12/26/2015   Procedure: ESOPHAGOGASTRODUODENOSCOPY (EGD);  Surgeon: Daneil Dolin, MD;  [facility];  Service: Endoscopy;  Laterality: N/A;  . GIVENS CAPSULE STUDY N/A 12/24/2015   Procedure: GIVENS CAPSULE STUDY;  Surgeon: Rogene Houston, MD;  [facility];  Service: Endoscopy;  Laterality: N/A;  . LEFT HEART CATHETERIZATION WITH CORONARY ANGIOGRAM N/A 04/22/2014   Procedure:  LEFT HEART CATHETERIZATION WITH CORONARY ANGIOGRAM;  Surgeon: Burnell Blanks, MD;  Location: [facility] CATH LAB;  Service: Cardiovascular;  Laterality: N/A;  . PERCUTANEOUS CORONARY STENT INTERVENTION (PCI-S) N/A 04/25/2014   Procedure: PERCUTANEOUS CORONARY STENT INTERVENTION (PCI-S);  Surgeon: Peter M Martinique, MD;  Location: [facility] CATH LAB;  Service: Cardiovascular;  Laterality: N/A;  . PERIPHERAL  VASCULAR CATHETERIZATION  04/23/2016   Procedure: Thrombectomy;  Surgeon: Leonie Man, MD;  Location: [place] [facility];  Service: Cardiovascular;;    Current Outpatient Prescriptions  Medication Sig Dispense Refill  . albuterol (PROVENTIL HFA;VENTOLIN HFA) 108 (90 Base) MCG/ACT inhaler Inhale 1-2 puffs into the lungs every 6 (six) hours as needed for wheezing or shortness of breath.    Marland Kitchen atorvastatin (LIPITOR) 10 MG tablet Take 1 tablet (10 mg total) by mouth daily. 90 tablet 3  . clopidogrel (PLAVIX) 75 MG tablet Take 1 tablet (75 mg total) by mouth daily. 90 tablet 3  . COLCRYS 0.6 MG tablet Take 1 tablet by mouth Once daily as needed (for gout).     . cyclobenzaprine (FLEXERIL) 10 MG tablet Take 1 tablet (10 mg total) by mouth 3 times/day as needed-between meals & bedtime for muscle spasms. 30 tablet 0  . famotidine (PEPCID) 40 MG tablet Take 1 tablet (40 mg total) by mouth at bedtime. 30 tablet 6  . ferrous sulfate 325 (65 FE) MG tablet Take 325 mg by mouth every other day.    . fluticasone (FLOVENT HFA) 44 MCG/ACT inhaler Inhale 2 puffs into the lungs 2 (two) times daily.    . isosorbide mononitrate (IMDUR) 60 MG 24 hr tablet TAKE ONE-HALF TABLET BY  MOUTH DAILY 45 tablet 6  . metoprolol tartrate (LOPRESSOR) 25 MG tablet Take 0.5 tablets (12.5 mg total) by mouth 2 (two) times daily. 30 tablet 0  . nitroGLYCERIN (NITROSTAT) 0.4 MG SL tablet Place 1 tablet (0.4 mg total) under the tongue every 5 (five) minutes x 3 doses as needed. If no relief after 3rd dose, proceed to the ED for an evalutation 75 tablet 1  . tamsulosin (FLOMAX) 0.4 MG CAPS capsule Take 0.4 mg by mouth at bedtime.     No current facility-administered medications for this visit.    Allergies:  Protonix [pantoprazole sodium]   Social History: The patient  reports that he quit smoking about 17 years ago. His smoking use included Cigarettes. He started smoking about 69 years ago. He has a 25.00 pack-year smoking history.  He has never used smokeless tobacco. He reports that he does not drink alcohol or use drugs.   ROS:  Please see the history of present illness. Otherwise, complete review of systems is positive for leg cramps at nighttime intermittently.  All other systems are reviewed and negative.   Physical Exam: VS:  BP (!) 148/72   Pulse 66   Ht 5' 7.5" (1.715 m)   Wt 139 lb (63 kg)   BMI 21.45 kg/m , BMI Body mass index is 21.45 kg/m.  Wt Readings from Last 3 Encounters:  11/18/16 139 lb (63 kg)  07/28/16 142 lb 3.2 oz (64.5 kg)  05/17/16 150 lb (68 kg)    General: Patient appears comfortable at rest. HEENT: Conjunctiva and lids normal, oropharynx clear. Neck: Supple, no elevated JVP or carotid bruits, no thyromegaly. Lungs: Clear to auscultation, nonlabored breathing at rest. Cardiac: Regular rate and rhythm, no S3 or significant systolic murmur, no pericardial rub. Abdomen: Soft, nontender, bowel sounds present, no guarding or rebound.  Extremities: No pitting edema, distal pulses 2+.  ECG: I personally reviewed the tracing from 04/24/2016 which showed sinus rhythm with low voltage, poor R-wave progression and nonspecific ST-T abnormalities.  Recent Labwork: 12/23/2015: Magnesium 2.1 04/22/2016: ALT 35; AST 26 04/23/2016: TSH 6.836 04/24/2016: BUN 13; Creatinine, Ser 0.99; Hemoglobin 12.7; Platelets 225; Potassium 4.3; Sodium 141     Component Value Date/Time   CHOL 121 04/23/2016 0346   TRIG 117 04/23/2016 0346   HDL 22 (L) 04/23/2016 0346   CHOLHDL 5.5 04/23/2016 0346   VLDL 23 04/23/2016 0346   LDLCALC 76 04/23/2016 0346    Other Studies Reviewed Today:  Cardiac catheterization 04/23/2016:  Native Vessels:  Mid RCA to Dist RCA lesion, 100 %stenosed.  Ost Cx to Prox Cx lesion, 95 %stenosed.  Prox Cx to Mid Cx lesion, 100 %stenosed.  Ost LAD to Prox LAD lesion, 80 %stenosed. The distal vessel fills via the LIMA graft.  LV end diastolic pressure is normal.  Graft  Angiography:  LIMA-LAD is widely patent.  SVG-RPDA: Prox Graft to Mid Graft lesion, 45 %stenosed. Mid Graft to Dist Graft stent 0 %stenosed  SVG-DIAG: Prox Graft to Mid Graft lesion, 35 %stenosed.  CULPRIT LESION:  SVG-OM1-OM2: Prox Graft lesion, 90 %stenosed - heavy thrombosis involving the previously placed stent extending all the way to the initial anastomosis. Mid Graft to Dist Graft lesion, 100 %stenosed.  A STENT SYNERGY DES 3X28 drug eluting stent was successfully placed - in the more proximal segment extending to and overlaps previously placed stent.  A STENT SYNERGY DES 3X38 drug eluting stent was successfully placed just proximal to the initial anastomosis and extends proximally to overlap the previously placed stent.  Post intervention, there is a 0% residual stenosis - TIMI 3 flow restored both new stents were widely patent and the previously placed stent was treated with balloon angioplasty with a 3.0 mm noncompliant balloon.  Successful but very complicated PCI restoring TIMI-3 flow in the sequential SVG-OM1-OM 2. There was extensive thrombosis from the proximal graft all the way into both graft vessels. 2 new stents were placed overlapping the prior stent both proximally and distally.  Echocardiogram 05/05/2016: Study Conclusions  - Left ventricle: The cavity size was normal. Wall thickness was increased in a pattern of moderate LVH. Systolic function was normal. The estimated ejection fraction was in the range of 55% to 60%. Wall motion was normal; there were no regional wall motion abnormalities. Doppler parameters are consistent with abnormal left ventricular relaxation (grade 1 diastolic dysfunction). - Aortic valve: Mildly calcified annulus. Trileaflet; mildly thickened leaflets. There was trivial regurgitation. Valve area (VTI): 2.48 cm^2. Valve area (Vmax): 2.48 cm^2. Valve area (Vmean): 2.34 cm^2. - Mitral valve: Mildly calcified annulus.  Mildly thickened leaflets . There was mild regurgitation. - Left atrium: The atrium was mildly dilated. - Technically adequate study.  Assessment and Plan:  1. Symptomatically stable CAD status post CABG with graft disease and subsequent DES 2 to the SVG to OM1 and OM 2 in November 2017. Continue with medical therapy and observation. No changes made to current regimen.  2. Hyperlipidemia, continues on Lipitor.  Current medicines were reviewed with the patient today.  Disposition: Follow-up in 6 months, sooner if needed.  Signed, Satira Sark, MD, [facility] 11/18/2016 11:52 AM    [place] at [place], [place], [place] [address] Phone: [phone]; Fax: [phone]

## 2016-11-18 ENCOUNTER — Encounter: Payer: Self-pay | Admitting: Cardiology

## 2016-11-18 ENCOUNTER — Ambulatory Visit (INDEPENDENT_AMBULATORY_CARE_PROVIDER_SITE_OTHER): Payer: Medicare Other | Admitting: Cardiology

## 2016-11-18 ENCOUNTER — Ambulatory Visit: Payer: Medicare Other | Admitting: Cardiology

## 2016-11-18 VITALS — BP 148/72 | HR 66 | Ht 67.5 in | Wt 139.0 lb

## 2016-11-18 DIAGNOSIS — I25119 Atherosclerotic heart disease of native coronary artery with unspecified angina pectoris: Secondary | ICD-10-CM | POA: Diagnosis not present

## 2016-11-18 DIAGNOSIS — E782 Mixed hyperlipidemia: Secondary | ICD-10-CM | POA: Diagnosis not present

## 2016-11-18 NOTE — Patient Instructions (Signed)

## 2016-12-02 ENCOUNTER — Ambulatory Visit: Payer: Medicare Other | Admitting: Cardiology

## 2017-01-10 DIAGNOSIS — E782 Mixed hyperlipidemia: Secondary | ICD-10-CM | POA: Diagnosis not present

## 2017-01-10 DIAGNOSIS — D649 Anemia, unspecified: Secondary | ICD-10-CM | POA: Diagnosis not present

## 2017-01-10 DIAGNOSIS — M1A9XX1 Chronic gout, unspecified, with tophus (tophi): Secondary | ICD-10-CM | POA: Diagnosis not present

## 2017-01-10 DIAGNOSIS — I10 Essential (primary) hypertension: Secondary | ICD-10-CM | POA: Diagnosis not present

## 2017-01-10 DIAGNOSIS — R319 Hematuria, unspecified: Secondary | ICD-10-CM | POA: Diagnosis not present

## 2017-01-10 DIAGNOSIS — I214 Non-ST elevation (NSTEMI) myocardial infarction: Secondary | ICD-10-CM | POA: Diagnosis not present

## 2017-01-10 DIAGNOSIS — D500 Iron deficiency anemia secondary to blood loss (chronic): Secondary | ICD-10-CM | POA: Diagnosis not present

## 2017-02-15 ENCOUNTER — Other Ambulatory Visit: Payer: Self-pay | Admitting: Cardiology

## 2017-04-13 DIAGNOSIS — E782 Mixed hyperlipidemia: Secondary | ICD-10-CM | POA: Diagnosis not present

## 2017-04-13 DIAGNOSIS — I10 Essential (primary) hypertension: Secondary | ICD-10-CM | POA: Diagnosis not present

## 2017-04-13 DIAGNOSIS — M1A0721 Idiopathic chronic gout, left ankle and foot, with tophus (tophi): Secondary | ICD-10-CM | POA: Diagnosis not present

## 2017-04-13 DIAGNOSIS — D500 Iron deficiency anemia secondary to blood loss (chronic): Secondary | ICD-10-CM | POA: Diagnosis not present

## 2017-04-13 DIAGNOSIS — I25812 Atherosclerosis of bypass graft of coronary artery of transplanted heart without angina pectoris: Secondary | ICD-10-CM | POA: Diagnosis not present

## 2017-05-20 IMAGING — CR DG CHEST 1V PORT
1 series · 1 of 1 positions shown · non-contrast
Comparison: Chest radiograph performed 12/23/2015

CLINICAL DATA: Acute onset of midsternal chest pain, with pain in
both arms. Initial encounter.

EXAM:
PORTABLE CHEST 1 VIEW

[portable]
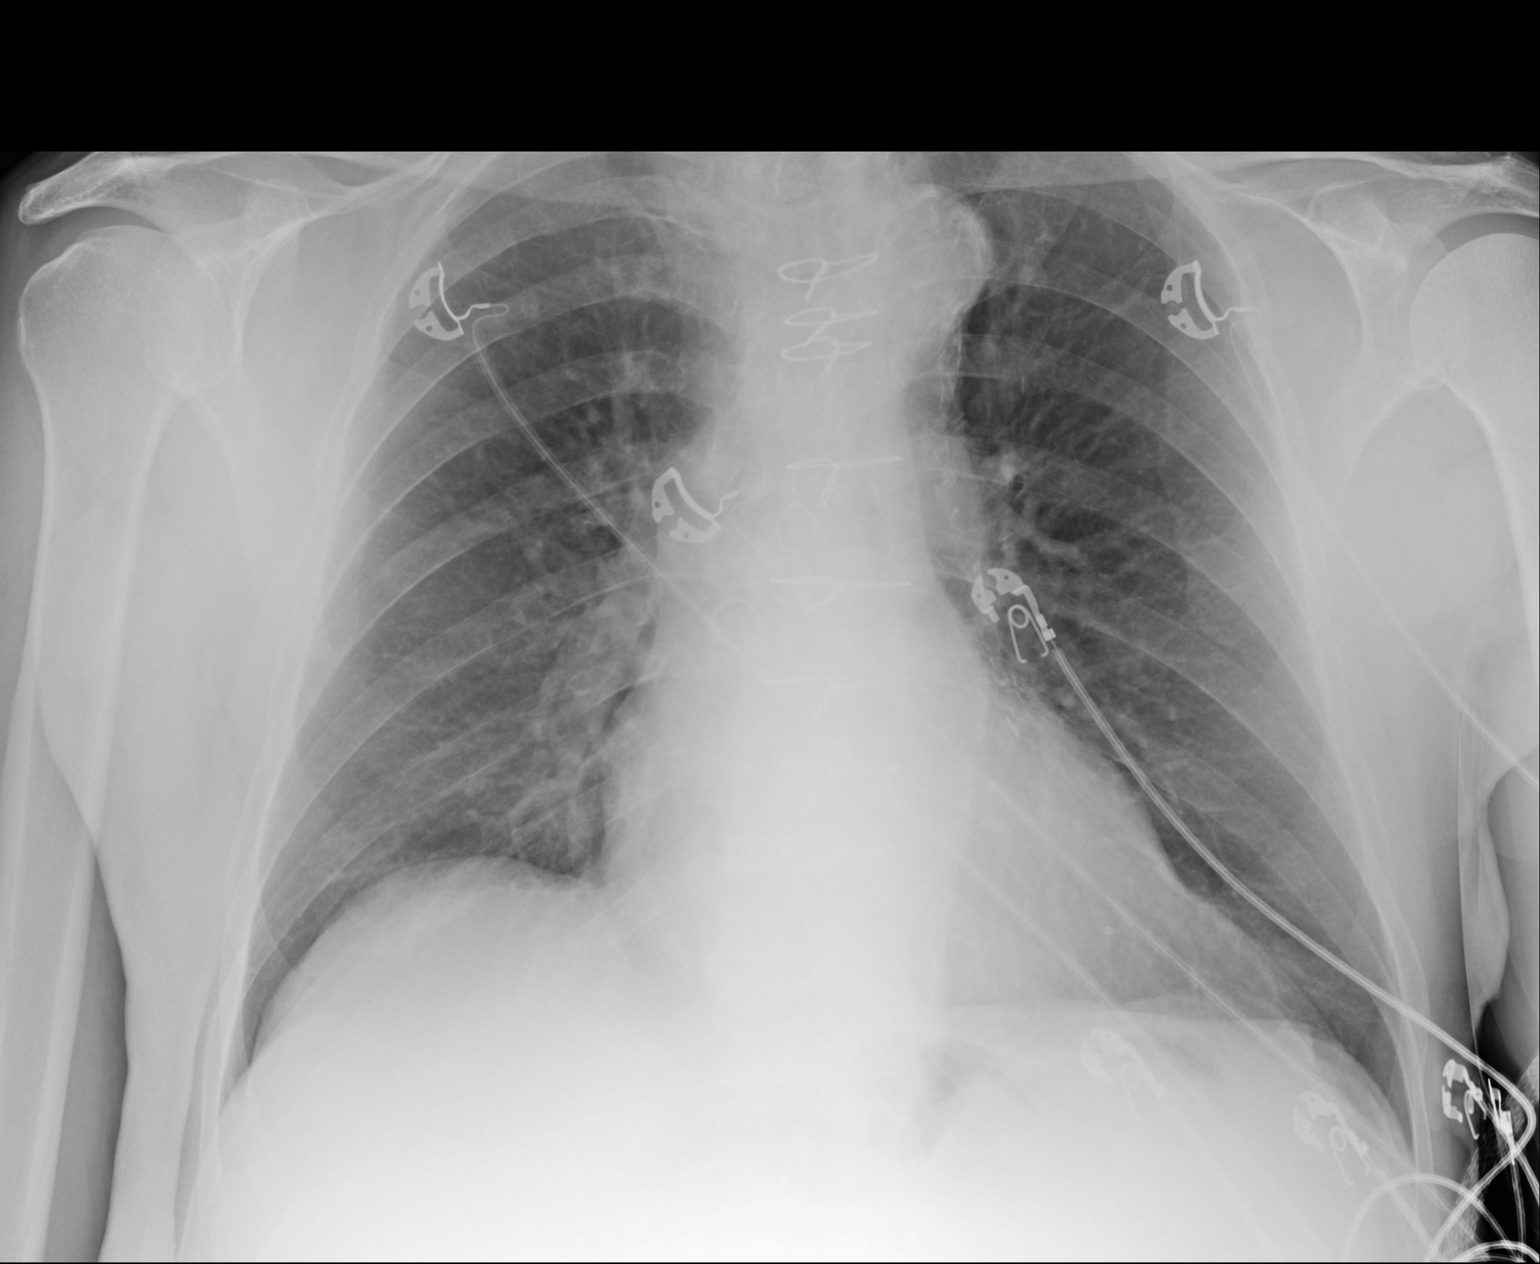

[1 of 1 positions shown; findings below may reference images not displayed]

FINDINGS: The lungs are well-aerated. Mild vascular congestion is noted. There
is no evidence of focal opacification, pleural effusion or
pneumothorax.

The cardiomediastinal silhouette is borderline normal in size. The
patient is status post median sternotomy. No acute osseous
abnormalities are seen.
IMPRESSION: Mild vascular congestion noted.  Lungs remain grossly clear.

## 2017-05-22 NOTE — Progress Notes (Signed)
Cardiology Office Note  Date: 05/23/2017   ID: [name], DOB [birth year], MRN [id number]  PCP: Curlene Labrum, MD  Primary Cardiologist: Rozann Lesches, MD   Chief Complaint  Patient presents with  . Coronary Artery Disease    History of Present Illness: Billy [name] is an 82 y.o. male last seen in June. He is here today with his wife for a follow-up visit. He continues to do well, reports no accelerating angina symptoms or nitroglycerin use. Remains active with ADLs. He reports NYHA class II dyspnea.  He has a history of CAD status post CABG with graft disease, status post DES 2 to the SVG to OM1 and OM 2 in November 2017. He remains on Plavix and reports no obvious bleeding problems.  Lab work from February of this year is outlined below. LDL was 60 on Lipitor.  Blood pressure control is adequate today.  Past Medical History:  Diagnosis Date  . Carotid artery disease ([place])    73-41% LICA and 9-37% RICA - December 2013  . Coronary atherosclerosis of native coronary artery    Multivessel status post CABG, DES x 2 SVG to OM with staged DES SVG to RCA November 2015, DES to SVG to PDA 05/2015  . Essential hypertension, benign   . Gout   . HOH (hard of hearing)   . Sinus bradycardia    Asymptomatic    Past Surgical History:  Procedure Laterality Date  . APPENDECTOMY     Ruptured  . CARDIAC CATHETERIZATION N/A 05/19/2015   Procedure: Left Heart Cath and Cors/Grafts Angiography;  Surgeon: Lorretta Harp, MD;  Location: [place] [facility];  Service: Cardiovascular;  Laterality: N/A;  . CARDIAC CATHETERIZATION N/A 05/19/2015   Procedure: Coronary Stent Intervention;  Surgeon: Lorretta Harp, MD;  Location: [place] [facility];  Service: Cardiovascular;  Laterality: N/A;  . CARDIAC CATHETERIZATION N/A 04/23/2016   Procedure: LEFT HEART CATH AND CORS/GRAFTS ANGIOGRAPHY;  Surgeon: Leonie Man, MD;  Location: [place] [facility];  Service: Cardiovascular;   Laterality: N/A;  . CARDIAC CATHETERIZATION N/A 04/23/2016   Procedure: Coronary Stent Intervention;  Surgeon: Leonie Man, MD;  Location: [place] [facility];  Service: Cardiovascular;  Laterality: N/A;  . CATARACT EXTRACTION W/PHACO Right 11/13/2012   Procedure: CATARACT EXTRACTION PHACO AND INTRAOCULAR LENS PLACEMENT (IOC);  Surgeon: Tonny Branch, MD;  Location: [facility];  Service: Ophthalmology;  Laterality: Right;  CDE:12.75  . CATARACT EXTRACTION W/PHACO Left 01/11/2013   Procedure: CATARACT EXTRACTION PHACO AND INTRAOCULAR LENS PLACEMENT (IOC);  Surgeon: Tonny Branch, MD;  Location: [facility];  Service: Ophthalmology;  Laterality: Left;  CDE: 21.13  . COLONOSCOPY  09/2013   Dr. Burke Keels: colitis descending colon and sigmoid colon.Bx ?Cdiff vs ischemic.  Marland Kitchen CORONARY ARTERY BYPASS GRAFT  11/09/1999   LIMA to LAD, SVG to diagonal, SVG to OM1 and OM2, SVG to PDA  . ENTEROSCOPY N/A 12/26/2015   Procedure: ENTEROSCOPY;  Surgeon: Daneil Dolin, MD;  [facility];  Service: Endoscopy;  Laterality: N/A;  . ESOPHAGOGASTRODUODENOSCOPY N/A 05/20/2015   Procedure: ESOPHAGOGASTRODUODENOSCOPY (EGD);  Surgeon: Wilford Corner, MD;  Location: [facility];  Service: Endoscopy;  Laterality: N/A;  . ESOPHAGOGASTRODUODENOSCOPY N/A 12/26/2015   Procedure: ESOPHAGOGASTRODUODENOSCOPY (EGD);  Surgeon: Daneil Dolin, MD;  [facility];  Service: Endoscopy;  Laterality: N/A;  . GIVENS CAPSULE STUDY N/A 12/24/2015   Procedure: GIVENS CAPSULE STUDY;  Surgeon: Rogene Houston, MD;  [facility];  Service: Endoscopy;  Laterality: N/A;  . LEFT HEART CATHETERIZATION WITH CORONARY ANGIOGRAM N/A 04/22/2014   Procedure: LEFT HEART CATHETERIZATION WITH CORONARY ANGIOGRAM;  Surgeon: Burnell Blanks, MD;  Location:  [facility] CATH LAB;  Service: Cardiovascular;  Laterality: N/A;  . PERCUTANEOUS CORONARY STENT INTERVENTION (PCI-S) N/A 04/25/2014   Procedure: PERCUTANEOUS CORONARY STENT INTERVENTION  (PCI-S);  Surgeon: Peter M Martinique, MD;  Location: [facility] CATH LAB;  Service: Cardiovascular;  Laterality: N/A;  . PERIPHERAL VASCULAR CATHETERIZATION  04/23/2016   Procedure: Thrombectomy;  Surgeon: Leonie Man, MD;  Location: [place] [facility];  Service: Cardiovascular;;    Current Outpatient Medications  Medication Sig Dispense Refill  . albuterol (PROVENTIL HFA;VENTOLIN HFA) 108 (90 Base) MCG/ACT inhaler Inhale 1-2 puffs into the lungs every 6 (six) hours as needed for wheezing or shortness of breath.    Marland Kitchen atorvastatin (LIPITOR) 10 MG tablet TAKE 1 TABLET BY MOUTH  DAILY 90 tablet 3  . clopidogrel (PLAVIX) 75 MG tablet TAKE 1 TABLET BY MOUTH  DAILY 90 tablet 3  . COLCRYS 0.6 MG tablet Take 1 tablet by mouth Once daily as needed (for gout).     . cyclobenzaprine (FLEXERIL) 10 MG tablet Take 1 tablet (10 mg total) by mouth 3 times/day as needed-between meals & bedtime for muscle spasms. 30 tablet 0  . famotidine (PEPCID) 40 MG tablet Take 1 tablet (40 mg total) by mouth at bedtime. 30 tablet 6  . ferrous sulfate 325 (65 FE) MG tablet Take 325 mg by mouth every other day.    . fluticasone (FLOVENT HFA) 44 MCG/ACT inhaler Inhale 2 puffs into the lungs 2 (two) times daily.    . isosorbide mononitrate (IMDUR) 60 MG 24 hr tablet TAKE ONE-HALF TABLET BY  MOUTH DAILY 45 tablet 6  . metoprolol tartrate (LOPRESSOR) 25 MG tablet Take 0.5 tablets (12.5 mg total) by mouth 2 (two) times daily. 30 tablet 0  . nitroGLYCERIN (NITROSTAT) 0.4 MG SL tablet Place 1 tablet (0.4 mg total) under the tongue every 5 (five) minutes x 3 doses as needed. If no relief after 3rd dose, proceed to the ED for an evalutation 75 tablet 1  . tamsulosin (FLOMAX) 0.4 MG CAPS capsule Take 0.4 mg by mouth at bedtime.     No current facility-administered medications for this visit.    Allergies:  Protonix [pantoprazole sodium]   Social History: The patient  reports that he quit smoking about 17 years ago. His smoking use included  cigarettes. He started smoking about 70 years ago. He has a 25.00 pack-year smoking history. he has never used smokeless tobacco. He reports that he does not drink alcohol or use drugs.  ROS:  Please see the history of present illness. Otherwise, complete review of systems is positive for hearing loss.  All other systems are reviewed and negative.   Physical Exam: VS:  BP 138/88   Pulse (!) 58   Ht 5' 7.5" (1.715 m)   Wt 144 lb (65.3 kg)   SpO2 98%   BMI 22.22 kg/m , BMI Body mass index is 22.22 kg/m.  Wt Readings from Last 3 Encounters:  05/23/17 144 lb (65.3 kg)  11/18/16 139 lb (63 kg)  07/28/16 142 lb 3.2 oz (64.5 kg)    General: Patient appears comfortable at rest. HEENT: Conjunctiva and lids normal, oropharynx clear. Neck: Supple, no elevated JVP or carotid bruits, no thyromegaly. Lungs: Clear to auscultation, nonlabored breathing at rest. Cardiac: Regular rate and rhythm, no S3  or significant systolic murmur, no pericardial rub. Abdomen: Soft, nontender, bowel sounds present, no guarding or rebound. Extremities: No pitting edema, distal pulses 2+. Skin: Warm and dry. Musculoskeletal: No kyphosis. Neuropsychiatric: Alert and oriented x3, affect grossly appropriate.  ECG: I personally reviewed the tracing from 04/24/2016 which showed sinus rhythm with old anterior infarct pattern and nonspecific ST T changes.  Recent Labwork:    Component Value Date/Time   CHOL 121 04/23/2016 0346   TRIG 117 04/23/2016 0346   HDL 22 (L) 04/23/2016 0346   CHOLHDL 5.5 04/23/2016 0346   VLDL 23 04/23/2016 0346   LDLCALC 76 04/23/2016 0346  February 2018: BUN 27, creatinine 1.11, AST 17, ALT 28, triglycerides 133, cholesterol 111, HDL 25, LDL 60, potassium 4.6, TSH 6.98, hemoglobin 13.6, platelets 285  Other Studies Reviewed Today:  Cardiac catheterization 04/23/2016:  Native Vessels:  Mid RCA to Dist RCA lesion, 100 %stenosed.  Ost Cx to Prox Cx lesion, 95 %stenosed.  Prox Cx to  Mid Cx lesion, 100 %stenosed.  Ost LAD to Prox LAD lesion, 80 %stenosed. The distal vessel fills via the LIMA graft.  LV end diastolic pressure is normal.  Graft Angiography:  LIMA-LAD is widely patent.  SVG-RPDA: Prox Graft to Mid Graft lesion, 45 %stenosed. Mid Graft to Dist Graft stent 0 %stenosed  SVG-DIAG: Prox Graft to Mid Graft lesion, 35 %stenosed.  CULPRIT LESION:  SVG-OM1-OM2: Prox Graft lesion, 90 %stenosed - heavy thrombosis involving the previously placed stent extending all the way to the initial anastomosis. Mid Graft to Dist Graft lesion, 100 %stenosed.  A STENT SYNERGY DES 3X28 drug eluting stent was successfully placed - in the more proximal segment extending to and overlaps previously placed stent.  A STENT SYNERGY DES 3X38 drug eluting stent was successfully placed just proximal to the initial anastomosis and extends proximally to overlap the previously placed stent.  Post intervention, there is a 0% residual stenosis - TIMI 3 flow restored both new stents were widely patent and the previously placed stent was treated with balloon angioplasty with a 3.0 mm noncompliant balloon.  Successful but very complicated PCI restoring TIMI-3 flow in the sequential SVG-OM1-OM 2. There was extensive thrombosis from the proximal graft all the way into both graft vessels. 2 new stents were placed overlapping the prior stent both proximally and distally.  Echocardiogram 05/05/2016: Study Conclusions  - Left ventricle: The cavity size was normal. Wall thickness was increased in a pattern of moderate LVH. Systolic function was normal. The estimated ejection fraction was in the range of 55% to 60%. Wall motion was normal; there were no regional wall motion abnormalities. Doppler parameters are consistent with abnormal left ventricular relaxation (grade 1 diastolic dysfunction). - Aortic valve: Mildly calcified annulus. Trileaflet; mildly thickened leaflets.  There was trivial regurgitation. Valve area (VTI): 2.48 cm^2. Valve area (Vmax): 2.48 cm^2. Valve area (Vmean): 2.34 cm^2. - Mitral valve: Mildly calcified annulus. Mildly thickened leaflets . There was mild regurgitation. - Left atrium: The atrium was mildly dilated. - Technically adequate study.  Assessment and Plan:  1. CAD status post CABG with graft disease and DES 2 to the SVG to OM1 and OM 2 as of November 2017. Fortunately, he remains clinically stable on medical therapy without progressive angina. No changes were made today.  2. Mixed hyperlipidemia, on Lipitor. Last LDL 60.  3. History of GI bleed, clinically stable. He is on Plavix alone at this point. Last hemoglobin of 13.6.  4. Essential hypertension, blood pressure is adequately  controlled today.  Current medicines were reviewed with the patient today.   Orders Placed This Encounter  Procedures  . EKG 12-Lead    Disposition: Follow-up in 6 months. \ Signed, Satira Sark, MD, [facility] 05/23/2017 10:06 AM    [place] at [place], [place], [place] [address] Phone: [phone]; Fax: [phone]

## 2017-05-23 ENCOUNTER — Ambulatory Visit (INDEPENDENT_AMBULATORY_CARE_PROVIDER_SITE_OTHER): Payer: Medicare Other | Admitting: Cardiology

## 2017-05-23 ENCOUNTER — Encounter: Payer: Self-pay | Admitting: Cardiology

## 2017-05-23 VITALS — BP 138/88 | HR 58 | Ht 67.5 in | Wt 144.0 lb

## 2017-05-23 DIAGNOSIS — I25119 Atherosclerotic heart disease of native coronary artery with unspecified angina pectoris: Secondary | ICD-10-CM

## 2017-05-23 DIAGNOSIS — E782 Mixed hyperlipidemia: Secondary | ICD-10-CM

## 2017-05-23 DIAGNOSIS — I10 Essential (primary) hypertension: Secondary | ICD-10-CM | POA: Diagnosis not present

## 2017-05-23 DIAGNOSIS — Z8719 Personal history of other diseases of the digestive system: Secondary | ICD-10-CM | POA: Diagnosis not present

## 2017-05-23 NOTE — Patient Instructions (Signed)

## 2017-06-24 ENCOUNTER — Other Ambulatory Visit: Payer: Self-pay | Admitting: Cardiology

## 2017-06-28 ENCOUNTER — Telehealth: Payer: Self-pay | Admitting: *Deleted

## 2017-06-28 NOTE — Telephone Encounter (Signed)
C/O chest pain that started last night rated 7/10 that radiated down left arm, neck and in both shoulders and c/o of having a lot of gas with belching. Took nitroglycerin X'2 and the pain resolved. No c/o sob or dizziness. No active chest pain. Wife is requesting an appointment for patient to be seen. Wife advised that she would be contacted after an appointment is scheduled. Wife verbalized understanding of plan. Wife said that she will be leaving home around 11:15 am and will be back after 1:00 pm. Patient does not hear well and doesn't talk on the phone.

## 2017-06-28 NOTE — H&P (View-Only) (Signed)
Cardiology Office Note  Date: 06/29/2017   ID: Billy [name], DOB [birth year], MRN [id number]  PCP: Curlene Labrum, MD  Primary Cardiologist: Rozann Lesches, MD   Chief Complaint  Patient presents with  . Coronary Artery Disease    History of Present Illness: Billy [name] is an 83 y.o. male that I recently saw in December 2018. He was placed on my schedule today after a call to nursing, spoke with Ms. Anderson yesterday regarding a recent episode of chest pain. He 0 his wife. He states that approximately 24 hours ago he started having recurring episodes of tightness and fullness in his epigastric area and precordial region, radiating up into the neck and upper arms. He felt this was reflux and took Tums without effect, eventually took a nitroglycerin which made the symptoms resolved. Unfortunately, these have continued to recur. He otherwise remains compliant with his medical therapy as an outpatient.  He has a history ofCAD status post CABG with graft disease, status post DES 2 to the SVG to OM1 and OM 2 in November 2017. He remains on Plavix and reports no obvious bleeding problems.   I personally reviewed his ECG today which shows sinus bradycardia with nonspecific ST-T changes.  Past Medical History:  Diagnosis Date  . Carotid artery disease ([place])    49-67% LICA and 5-91% RICA - December 2013  . Coronary atherosclerosis of native coronary artery    Multivessel status post CABG, DES x 2 SVG to OM with staged DES SVG to RCA November 2015, DES to SVG to PDA 05/2015  . Essential hypertension, benign   . Gout   . HOH (hard of hearing)   . Sinus bradycardia    Asymptomatic    Past Surgical History:  Procedure Laterality Date  . APPENDECTOMY     Ruptured  . CARDIAC CATHETERIZATION N/A 05/19/2015   Procedure: Left Heart Cath and Cors/Grafts Angiography;  Surgeon: Lorretta Harp, MD;  Location: [place] [facility];  Service: Cardiovascular;  Laterality: N/A;  . CARDIAC  CATHETERIZATION N/A 05/19/2015   Procedure: Coronary Stent Intervention;  Surgeon: Lorretta Harp, MD;  Location: [place] [facility];  Service: Cardiovascular;  Laterality: N/A;  . CARDIAC CATHETERIZATION N/A 04/23/2016   Procedure: LEFT HEART CATH AND CORS/GRAFTS ANGIOGRAPHY;  Surgeon: Leonie Man, MD;  Location: [place] [facility];  Service: Cardiovascular;  Laterality: N/A;  . CARDIAC CATHETERIZATION N/A 04/23/2016   Procedure: Coronary Stent Intervention;  Surgeon: Leonie Man, MD;  Location: [place] [facility];  Service: Cardiovascular;  Laterality: N/A;  . CATARACT EXTRACTION W/PHACO Right 11/13/2012   Procedure: CATARACT EXTRACTION PHACO AND INTRAOCULAR LENS PLACEMENT (IOC);  Surgeon: Tonny Branch, MD;  [facility];  Service: Ophthalmology;  Laterality: Right;  CDE:12.75  . CATARACT EXTRACTION W/PHACO Left 01/11/2013   Procedure: CATARACT EXTRACTION PHACO AND INTRAOCULAR LENS PLACEMENT (IOC);  Surgeon: Tonny Branch, MD;  [facility]: [facility];  Service: Ophthalmology;  Laterality: Left;  CDE: 21.13  . COLONOSCOPY  09/2013   Dr. Burke Keels: colitis descending colon and sigmoid colon.Bx ?Cdiff vs ischemic.  Marland Kitchen CORONARY ARTERY BYPASS GRAFT  11/09/1999   LIMA to LAD, SVG to diagonal, SVG to OM1 and OM2, SVG to PDA  . ENTEROSCOPY N/A 12/26/2015   Procedure: ENTEROSCOPY;  Surgeon: Daneil Dolin, MD;  [facility];  Service: Endoscopy;  Laterality: N/A;  . ESOPHAGOGASTRODUODENOSCOPY N/A 05/20/2015   Procedure: ESOPHAGOGASTRODUODENOSCOPY (EGD);  Surgeon: Wilford Corner, MD;  Location: [facility]  ENDOSCOPY;  Service: Endoscopy;  Laterality: N/A;  . ESOPHAGOGASTRODUODENOSCOPY N/A 12/26/2015   Procedure: ESOPHAGOGASTRODUODENOSCOPY (EGD);  Surgeon: Daneil Dolin, MD;  [facility];  Service: Endoscopy;  Laterality: N/A;  . GIVENS CAPSULE STUDY N/A 12/24/2015   Procedure: GIVENS CAPSULE STUDY;  Surgeon: Rogene Houston, MD;  [facility];  Service: Endoscopy;  Laterality:  N/A;  . LEFT HEART CATHETERIZATION WITH CORONARY ANGIOGRAM N/A 04/22/2014   Procedure: LEFT HEART CATHETERIZATION WITH CORONARY ANGIOGRAM;  Surgeon: Burnell Blanks, MD;  Location: [facility] CATH LAB;  Service: Cardiovascular;  Laterality: N/A;  . PERCUTANEOUS CORONARY STENT INTERVENTION (PCI-S) N/A 04/25/2014   Procedure: PERCUTANEOUS CORONARY STENT INTERVENTION (PCI-S);  Surgeon: Peter M Martinique, MD;  Location: [facility] CATH LAB;  Service: Cardiovascular;  Laterality: N/A;  . PERIPHERAL VASCULAR CATHETERIZATION  04/23/2016   Procedure: Thrombectomy;  Surgeon: Leonie Man, MD;  Location: [place] [facility];  Service: Cardiovascular;;    Current Outpatient Medications  Medication Sig Dispense Refill  . atorvastatin (LIPITOR) 10 MG tablet TAKE 1 TABLET BY MOUTH  DAILY 90 tablet 3  . clopidogrel (PLAVIX) 75 MG tablet TAKE 1 TABLET BY MOUTH  DAILY 90 tablet 3  . COLCRYS 0.6 MG tablet Take 1 tablet by mouth Once daily as needed (for gout).     . cyclobenzaprine (FLEXERIL) 10 MG tablet Take 1 tablet (10 mg total) by mouth 3 times/day as needed-between meals & bedtime for muscle spasms. 30 tablet 0  . famotidine (PEPCID) 40 MG tablet TAKE 1 TABLET BY MOUTH AT  BEDTIME 90 tablet 1  . ferrous sulfate 325 (65 FE) MG tablet Take 325 mg by mouth every other day.    . fluticasone (FLONASE) 50 MCG/ACT nasal spray Place 1 spray into both nostrils as needed for allergies or rhinitis.    Marland Kitchen isosorbide mononitrate (IMDUR) 60 MG 24 hr tablet TAKE ONE-HALF TABLET BY  MOUTH DAILY 45 tablet 6  . Magnesium 250 MG TABS Take 1 tablet by mouth daily.    . metoprolol tartrate (LOPRESSOR) 25 MG tablet Take 0.5 tablets (12.5 mg total) by mouth 2 (two) times daily. 30 tablet 0  . nitroGLYCERIN (NITROSTAT) 0.4 MG SL tablet Place 1 tablet (0.4 mg total) under the tongue every 5 (five) minutes x 3 doses as needed. If no relief after 3rd dose, proceed to the ED for an evalutation 75 tablet 1  . tamsulosin (FLOMAX) 0.4 MG CAPS  capsule Take 0.4 mg by mouth at bedtime.     No current facility-administered medications for this visit.    Allergies:  Protonix [pantoprazole sodium]   Social History: The patient  reports that he quit smoking about 18 years ago. His smoking use included cigarettes. He started smoking about 70 years ago. He has a 25.00 pack-year smoking history. he has never used smokeless tobacco. He reports that he does not drink alcohol or use drugs.  Family History: The patient's family history includes Cancer in his father.   ROS:  Please see the history of present illness. Otherwise, complete review of systems is positive for none.  All other systems are reviewed and negative.   Physical Exam: VS:  BP 118/68   Pulse 75   Ht 5' 7.5" (1.715 m)   Wt 140 lb 6.4 oz (63.7 kg)   SpO2 96%   BMI 21.67 kg/m , BMI Body mass index is 21.67 kg/m.  Wt Readings from Last 3 Encounters:  06/29/17 140 lb 6.4 oz (63.7 kg)  05/23/17 144 lb (65.3 kg)  11/18/16 139 lb (63 kg)    General: Patient appears comfortable at rest. HEENT: Conjunctiva and lids normal, oropharynx clear. Neck: Supple, no elevated JVP or carotid bruits, no thyromegaly. Lungs: Clear to auscultation, nonlabored breathing at rest. Cardiac: Regular rate and rhythm, no S3 or significant systolic murmur, no pericardial rub. Abdomen: Soft, nontender, bowel sounds present. Extremities: No pitting edema, distal pulses 2+. Skin: Warm and dry. Musculoskeletal: No kyphosis. Neuropsychiatric: Alert and oriented x3, affect grossly appropriate.  ECG: I personally reviewed the tracing from 05/23/2017 which showed sinus rhythm with nonspecific ST-T wave changes.  Recent Labwork:    Component Value Date/Time   CHOL 121 04/23/2016 0346   TRIG 117 04/23/2016 0346   HDL 22 (L) 04/23/2016 0346   CHOLHDL 5.5 04/23/2016 0346   VLDL 23 04/23/2016 0346   LDLCALC 76 04/23/2016 0346    Other Studies Reviewed Today:  Cardiac catheterization  04/23/2016:  Native Vessels:  Mid RCA to Dist RCA lesion, 100 %stenosed.  Ost Cx to Prox Cx lesion, 95 %stenosed.  Prox Cx to Mid Cx lesion, 100 %stenosed.  Ost LAD to Prox LAD lesion, 80 %stenosed. The distal vessel fills via the LIMA graft.  LV end diastolic pressure is normal.  Graft Angiography:  LIMA-LAD is widely patent.  SVG-RPDA: Prox Graft to Mid Graft lesion, 45 %stenosed. Mid Graft to Dist Graft stent 0 %stenosed  SVG-DIAG: Prox Graft to Mid Graft lesion, 35 %stenosed.  CULPRIT LESION:  SVG-OM1-OM2: Prox Graft lesion, 90 %stenosed - heavy thrombosis involving the previously placed stent extending all the way to the initial anastomosis. Mid Graft to Dist Graft lesion, 100 %stenosed.  A STENT SYNERGY DES 3X28 drug eluting stent was successfully placed - in the more proximal segment extending to and overlaps previously placed stent.  A STENT SYNERGY DES 3X38 drug eluting stent was successfully placed just proximal to the initial anastomosis and extends proximally to overlap the previously placed stent.  Post intervention, there is a 0% residual stenosis - TIMI 3 flow restored both new stents were widely patent and the previously placed stent was treated with balloon angioplasty with a 3.0 mm noncompliant balloon.  Successful but very complicated PCI restoring TIMI-3 flow in the sequential SVG-OM1-OM 2. There was extensive thrombosis from the proximal graft all the way into both graft vessels. 2 new stents were placed overlapping the prior stent both proximally and distally.  Echocardiogram 05/05/2016: Study Conclusions  - Left ventricle: The cavity size was normal. Wall thickness was increased in a pattern of moderate LVH. Systolic function was normal. The estimated ejection fraction was in the range of 55% to 60%. Wall motion was normal; there were no regional wall motion abnormalities. Doppler parameters are consistent with abnormal left ventricular  relaxation (grade 1 diastolic dysfunction). - Aortic valve: Mildly calcified annulus. Trileaflet; mildly thickened leaflets. There was trivial regurgitation. Valve area (VTI): 2.48 cm^2. Valve area (Vmax): 2.48 cm^2. Valve area (Vmean): 2.34 cm^2. - Mitral valve: Mildly calcified annulus. Mildly thickened leaflets . There was mild regurgitation. - Left atrium: The atrium was mildly dilated. - Technically adequate study.  Assessment and Plan:  1. Accelerating angina symptoms on baseline medical therapy. He is already on long-acting nitrates, beta blocker, statin, and long-term Plavix. ECG is nonspecific. We have discussed the situation and in light of his PCI history, diagnostic cardiac catheterization will be scheduled for further assessment. He is in agreement to proceed.  2. Mixed hyperlipidemia, continues on Lipitor.  3. History of GI bleed. He has been able to maintain standing Plavix dose, although was on a limited course of dual antiplatelet therapy after his prior DES.  4. Essential hypertension, blood pressure is well controlled today.  Current medicines were reviewed with the patient today.   Orders Placed This Encounter  Procedures  . INR/PT  . Basic metabolic panel  . CBC with Differential  . EKG 12-Lead    Disposition: Follow-up after procedure.  Signed, Satira Sark, MD, [facility] 06/29/2017 1:53 PM    [place] at [place], [place], [place] [address] Phone: [phone]; Fax: [phone]

## 2017-06-28 NOTE — Telephone Encounter (Signed)
Wife contacted and given appointment for patient to be seen on tomorrow at 1:20 pm. Wife advised to take patient to the ED if his symptoms get worse. Wife verbalized understanding of plan.

## 2017-06-28 NOTE — Progress Notes (Signed)
Cardiology Office Note  Date: 06/29/2017   ID: Billy [name], DOB [birth year], MRN [id number]  PCP: Curlene Labrum, MD  Primary Cardiologist: Rozann Lesches, MD   Chief Complaint  Patient presents with  . Coronary Artery Disease    History of Present Illness: Billy [name] is an 83 y.o. male that I recently saw in December 2018. He was placed on my schedule today after a call to nursing, spoke with Ms. Anderson yesterday regarding a recent episode of chest pain. He 0 his wife. He states that approximately 24 hours ago he started having recurring episodes of tightness and fullness in his epigastric area and precordial region, radiating up into the neck and upper arms. He felt this was reflux and took Tums without effect, eventually took a nitroglycerin which made the symptoms resolved. Unfortunately, these have continued to recur. He otherwise remains compliant with his medical therapy as an outpatient.  He has a history ofCAD status post CABG with graft disease, status post DES 2 to the SVG to OM1 and OM 2 in November 2017. He remains on Plavix and reports no obvious bleeding problems.   I personally reviewed his ECG today which shows sinus bradycardia with nonspecific ST-T changes.  Past Medical History:  Diagnosis Date  . Carotid artery disease ([place])    17-40% LICA and 8-14% RICA - December 2013  . Coronary atherosclerosis of native coronary artery    Multivessel status post CABG, DES x 2 SVG to OM with staged DES SVG to RCA November 2015, DES to SVG to PDA 05/2015  . Essential hypertension, benign   . Gout   . HOH (hard of hearing)   . Sinus bradycardia    Asymptomatic    Past Surgical History:  Procedure Laterality Date  . APPENDECTOMY     Ruptured  . CARDIAC CATHETERIZATION N/A 05/19/2015   Procedure: Left Heart Cath and Cors/Grafts Angiography;  Surgeon: Lorretta Harp, MD;  Location: [facility];  Service: Cardiovascular;  Laterality: N/A;  . CARDIAC  CATHETERIZATION N/A 05/19/2015   Procedure: Coronary Stent Intervention;  Surgeon: Lorretta Harp, MD;  Location: [facility];  Service: Cardiovascular;  Laterality: N/A;  . CARDIAC CATHETERIZATION N/A 04/23/2016   Procedure: LEFT HEART CATH AND CORS/GRAFTS ANGIOGRAPHY;  Surgeon: Leonie Man, MD;  Location: [facility];  Service: Cardiovascular;  Laterality: N/A;  . CARDIAC CATHETERIZATION N/A 04/23/2016   Procedure: Coronary Stent Intervention;  Surgeon: Leonie Man, MD;  Location: [facility];  Service: Cardiovascular;  Laterality: N/A;  . CATARACT EXTRACTION W/PHACO Right 11/13/2012   Procedure: CATARACT EXTRACTION PHACO AND INTRAOCULAR LENS PLACEMENT (IOC);  Surgeon: Tonny Branch, MD;  Location: [facility];  Service: Ophthalmology;  Laterality: Right;  CDE:12.75  . CATARACT EXTRACTION W/PHACO Left 01/11/2013   Procedure: CATARACT EXTRACTION PHACO AND INTRAOCULAR LENS PLACEMENT (IOC);  Surgeon: Tonny Branch, MD;  Location: [facility];  Service: Ophthalmology;  Laterality: Left;  CDE: 21.13  . COLONOSCOPY  09/2013   Dr. Burke Keels: colitis descending colon and sigmoid colon.Bx ?Cdiff vs ischemic.  Marland Kitchen CORONARY ARTERY BYPASS GRAFT  11/09/1999   LIMA to LAD, SVG to diagonal, SVG to OM1 and OM2, SVG to PDA  . ENTEROSCOPY N/A 12/26/2015   Procedure: ENTEROSCOPY;  Surgeon: Daneil Dolin, MD;  [facility];  Service: Endoscopy;  Laterality: N/A;  . ESOPHAGOGASTRODUODENOSCOPY N/A 05/20/2015   Procedure: ESOPHAGOGASTRODUODENOSCOPY (EGD);  Surgeon: Wilford Corner, MD;  Location: [facility]  ENDOSCOPY;  Service: Endoscopy;  Laterality: N/A;  . ESOPHAGOGASTRODUODENOSCOPY N/A 12/26/2015   Procedure: ESOPHAGOGASTRODUODENOSCOPY (EGD);  Surgeon: Daneil Dolin, MD;  [facility];  Service: Endoscopy;  Laterality: N/A;  . GIVENS CAPSULE STUDY N/A 12/24/2015   Procedure: GIVENS CAPSULE STUDY;  Surgeon: Rogene Houston, MD;  [facility];  Service: Endoscopy;  Laterality:  N/A;  . LEFT HEART CATHETERIZATION WITH CORONARY ANGIOGRAM N/A 04/22/2014   Procedure: LEFT HEART CATHETERIZATION WITH CORONARY ANGIOGRAM;  Surgeon: Burnell Blanks, MD;  Location: [facility] CATH LAB;  Service: Cardiovascular;  Laterality: N/A;  . PERCUTANEOUS CORONARY STENT INTERVENTION (PCI-S) N/A 04/25/2014   Procedure: PERCUTANEOUS CORONARY STENT INTERVENTION (PCI-S);  Surgeon: Peter M Martinique, MD;  Location: [facility] CATH LAB;  Service: Cardiovascular;  Laterality: N/A;  . PERIPHERAL VASCULAR CATHETERIZATION  04/23/2016   Procedure: Thrombectomy;  Surgeon: Leonie Man, MD;  Location: [facility];  Service: Cardiovascular;;    Current Outpatient Medications  Medication Sig Dispense Refill  . atorvastatin (LIPITOR) 10 MG tablet TAKE 1 TABLET BY MOUTH  DAILY 90 tablet 3  . clopidogrel (PLAVIX) 75 MG tablet TAKE 1 TABLET BY MOUTH  DAILY 90 tablet 3  . COLCRYS 0.6 MG tablet Take 1 tablet by mouth Once daily as needed (for gout).     . cyclobenzaprine (FLEXERIL) 10 MG tablet Take 1 tablet (10 mg total) by mouth 3 times/day as needed-between meals & bedtime for muscle spasms. 30 tablet 0  . famotidine (PEPCID) 40 MG tablet TAKE 1 TABLET BY MOUTH AT  BEDTIME 90 tablet 1  . ferrous sulfate 325 (65 FE) MG tablet Take 325 mg by mouth every other day.    . fluticasone (FLONASE) 50 MCG/ACT nasal spray Place 1 spray into both nostrils as needed for allergies or rhinitis.    Marland Kitchen isosorbide mononitrate (IMDUR) 60 MG 24 hr tablet TAKE ONE-HALF TABLET BY  MOUTH DAILY 45 tablet 6  . Magnesium 250 MG TABS Take 1 tablet by mouth daily.    . metoprolol tartrate (LOPRESSOR) 25 MG tablet Take 0.5 tablets (12.5 mg total) by mouth 2 (two) times daily. 30 tablet 0  . nitroGLYCERIN (NITROSTAT) 0.4 MG SL tablet Place 1 tablet (0.4 mg total) under the tongue every 5 (five) minutes x 3 doses as needed. If no relief after 3rd dose, proceed to the ED for an evalutation 75 tablet 1  . tamsulosin (FLOMAX) 0.4 MG CAPS  capsule Take 0.4 mg by mouth at bedtime.     No current facility-administered medications for this visit.    Allergies:  Protonix [pantoprazole sodium]   Social History: The patient  reports that he quit smoking about 18 years ago. His smoking use included cigarettes. He started smoking about 70 years ago. He has a 25.00 pack-year smoking history. he has never used smokeless tobacco. He reports that he does not drink alcohol or use drugs.  Family History: The patient's family history includes Cancer in his father.   ROS:  Please see the history of present illness. Otherwise, complete review of systems is positive for none.  All other systems are reviewed and negative.   Physical Exam: VS:  BP 118/68   Pulse 75   Ht 5' 7.5" (1.715 m)   Wt 140 lb 6.4 oz (63.7 kg)   SpO2 96%   BMI 21.67 kg/m , BMI Body mass index is 21.67 kg/m.  Wt Readings from Last 3 Encounters:  06/29/17 140 lb 6.4 oz (63.7 kg)  05/23/17 144 lb (65.3 kg)  11/18/16 139 lb (63 kg)    General: Patient appears comfortable at rest. HEENT: Conjunctiva and lids normal, oropharynx clear. Neck: Supple, no elevated JVP or carotid bruits, no thyromegaly. Lungs: Clear to auscultation, nonlabored breathing at rest. Cardiac: Regular rate and rhythm, no S3 or significant systolic murmur, no pericardial rub. Abdomen: Soft, nontender, bowel sounds present. Extremities: No pitting edema, distal pulses 2+. Skin: Warm and dry. Musculoskeletal: No kyphosis. Neuropsychiatric: Alert and oriented x3, affect grossly appropriate.  ECG: I personally reviewed the tracing from 05/23/2017 which showed sinus rhythm with nonspecific ST-T wave changes.  Recent Labwork:    Component Value Date/Time   CHOL 121 04/23/2016 0346   TRIG 117 04/23/2016 0346   HDL 22 (L) 04/23/2016 0346   CHOLHDL 5.5 04/23/2016 0346   VLDL 23 04/23/2016 0346   LDLCALC 76 04/23/2016 0346    Other Studies Reviewed Today:  Cardiac catheterization  04/23/2016:  Native Vessels:  Mid RCA to Dist RCA lesion, 100 %stenosed.  Ost Cx to Prox Cx lesion, 95 %stenosed.  Prox Cx to Mid Cx lesion, 100 %stenosed.  Ost LAD to Prox LAD lesion, 80 %stenosed. The distal vessel fills via the LIMA graft.  LV end diastolic pressure is normal.  Graft Angiography:  LIMA-LAD is widely patent.  SVG-RPDA: Prox Graft to Mid Graft lesion, 45 %stenosed. Mid Graft to Dist Graft stent 0 %stenosed  SVG-DIAG: Prox Graft to Mid Graft lesion, 35 %stenosed.  CULPRIT LESION:  SVG-OM1-OM2: Prox Graft lesion, 90 %stenosed - heavy thrombosis involving the previously placed stent extending all the way to the initial anastomosis. Mid Graft to Dist Graft lesion, 100 %stenosed.  A STENT SYNERGY DES [id number] drug eluting stent was successfully placed - in the more proximal segment extending to and overlaps previously placed stent.  A STENT SYNERGY DES [id number] drug eluting stent was successfully placed just proximal to the initial anastomosis and extends proximally to overlap the previously placed stent.  Post intervention, there is a 0% residual stenosis - TIMI 3 flow restored both new stents were widely patent and the previously placed stent was treated with balloon angioplasty with a 3.0 mm noncompliant balloon.  Successful but very complicated PCI restoring TIMI-3 flow in the sequential SVG-OM1-OM 2. There was extensive thrombosis from the proximal graft all the way into both graft vessels. 2 new stents were placed overlapping the prior stent both proximally and distally.  Echocardiogram 05/05/2016: Study Conclusions  - Left ventricle: The cavity size was normal. Wall thickness was increased in a pattern of moderate LVH. Systolic function was normal. The estimated ejection fraction was in the range of 55% to 60%. Wall motion was normal; there were no regional wall motion abnormalities. Doppler parameters are consistent with abnormal left ventricular  relaxation (grade 1 diastolic dysfunction). - Aortic valve: Mildly calcified annulus. Trileaflet; mildly thickened leaflets. There was trivial regurgitation. Valve area (VTI): 2.48 cm^2. Valve area (Vmax): 2.48 cm^2. Valve area (Vmean): 2.34 cm^2. - Mitral valve: Mildly calcified annulus. Mildly thickened leaflets . There was mild regurgitation. - Left atrium: The atrium was mildly dilated. - Technically adequate study.  Assessment and Plan:  1. Accelerating angina symptoms on baseline medical therapy. He is already on long-acting nitrates, beta blocker, statin, and long-term Plavix. ECG is nonspecific. We have discussed the situation and in light of his PCI history, diagnostic cardiac catheterization will be scheduled for further assessment. He is in agreement to proceed.  2. Mixed hyperlipidemia, continues on Lipitor.  3. History of GI bleed. He has been able to maintain standing Plavix dose, although was on a limited course of dual antiplatelet therapy after his prior DES.  4. Essential hypertension, blood pressure is well controlled today.  Current medicines were reviewed with the patient today.   Orders Placed This Encounter  Procedures  . INR/PT  . Basic metabolic panel  . CBC with Differential  . EKG 12-Lead    Disposition: Follow-up after procedure.  Signed, Satira Sark, MD, [facility] 06/29/2017 1:53 PM    [facility] at [place], [place], [place] [address] Phone: [phone]; Fax: [phone]

## 2017-06-29 ENCOUNTER — Telehealth: Payer: Self-pay | Admitting: Cardiology

## 2017-06-29 ENCOUNTER — Encounter: Payer: Self-pay | Admitting: Cardiology

## 2017-06-29 ENCOUNTER — Ambulatory Visit (INDEPENDENT_AMBULATORY_CARE_PROVIDER_SITE_OTHER): Payer: Medicare Other | Admitting: Cardiology

## 2017-06-29 ENCOUNTER — Other Ambulatory Visit: Payer: Self-pay | Admitting: Cardiology

## 2017-06-29 VITALS — BP 118/68 | HR 75 | Ht 67.5 in | Wt 140.4 lb

## 2017-06-29 DIAGNOSIS — I25119 Atherosclerotic heart disease of native coronary artery with unspecified angina pectoris: Secondary | ICD-10-CM

## 2017-06-29 DIAGNOSIS — I200 Unstable angina: Secondary | ICD-10-CM

## 2017-06-29 DIAGNOSIS — R079 Chest pain, unspecified: Secondary | ICD-10-CM | POA: Diagnosis not present

## 2017-06-29 DIAGNOSIS — I10 Essential (primary) hypertension: Secondary | ICD-10-CM | POA: Diagnosis not present

## 2017-06-29 DIAGNOSIS — E782 Mixed hyperlipidemia: Secondary | ICD-10-CM

## 2017-06-29 DIAGNOSIS — Z8719 Personal history of other diseases of the digestive system: Secondary | ICD-10-CM | POA: Diagnosis not present

## 2017-06-29 LAB — PROTIME-INR

## 2017-06-29 MED ORDER — METOPROLOL TARTRATE 25 MG PO TABS: 12.5000 mg | ORAL_TABLET | Freq: Two times a day (BID) | ORAL | 1 refills | Status: DC

## 2017-06-29 NOTE — Patient Instructions (Signed)
Medication Instructions:  Your physician recommends that you continue on your current medications as directed. Please refer to the Current Medication list given to you today.  Labwork: PT, CBC, BMP Orders given today   Testing/Procedures:   [place] [address] Dept: [phone] Loc: [place]  06/29/2017  You are scheduled for a Cardiac Catheterization on Friday, January 25 with Dr. Lauree Chandler.  1. Please arrive at the [facility] (Main Entrance A) at [facility]: [address] [place], [place] [address] at 5:30 AM (two hours before your procedure to ensure your preparation). Free valet parking service is available.   Special note: Every effort is made to have your procedure done on time. Please understand that emergencies sometimes delay scheduled procedures.  2. Diet: Do not eat or drink anything after midnight prior to your procedure except sips of water to take medications.  3. Labs: You will need to have blood drawn on Thursday, January 24 at [place], [place]  Open: 7am - 6pm, Sat 8am - 12 noon   Phone: [phone]. You do not need to be fasting.  4. Medication instructions in preparation for your procedure:    On the morning of your procedure, take your Aspirin and Plavix/Clopidogrel and any morning medicines NOT listed above.  You may use sips of water.  5. Plan for one night stay--bring personal belongings. 6. Bring a current list of your medications and current insurance cards. 7. You MUST have a responsible person to drive you home. 8. Someone MUST be with you the first 24 hours after you arrive home or your discharge will be delayed. 9. Please wear clothes that are easy to get on and off and wear slip-on shoes.  Thank you for allowing [place] to care for you!   -- [place]  Invasive Cardi[facility] services  Follow-Up: Your physician recommends that you schedule a follow-up appointment in: [place]  Any Other Special Instructions Will Be Listed Below (If Applicable).  If you need a refill on your cardiac medications before your next appointment, please call your pharmacy.

## 2017-06-29 NOTE — Addendum Note (Signed)
Addended by: Acquanetta Chain on: 06/29/2017 01:59 PM   Modules accepted: Orders

## 2017-06-29 NOTE — Telephone Encounter (Signed)
L heart cath on 07/01/17 at 7:30

## 2017-06-30 ENCOUNTER — Telehealth: Payer: Self-pay | Admitting: *Deleted

## 2017-06-30 ENCOUNTER — Encounter: Payer: Self-pay | Admitting: *Deleted

## 2017-06-30 NOTE — Telephone Encounter (Signed)
I spoke with patient's wife, [name] and reviewed pre cath instructions with her. She was concerned about inclement weather early in the morning and requested to change time to later in the day.  The time was changed with [place] in Cath Lab  for patient to arrive tomorrow morning at 10 AM for a 12 N procedure, wife aware and verbalized understanding. Thanked me for changing time.

## 2017-06-30 NOTE — Telephone Encounter (Signed)
Patient contacted pre-catheterization at [facility] scheduled for: [facility] and grafts 07/01/17 12 Noon  Verified arrival time and place: [facility] NT/Main A 10 AM  Confirmed AM meds to be taken pre-cath with sip of water:  ASA 81 mg AM of Plavix 75 mg AM of  Confirmed allergies listed. Confirmed no diabetic medications.  Confirmed patient has responsible person to drive home post procedure and observe patient for 24 hours: yes

## 2017-07-01 ENCOUNTER — Ambulatory Visit (HOSPITAL_COMMUNITY)
Admission: RE | Disposition: A | Discharge status: Home / Self Care | Payer: Self-pay | Source: Ambulatory Visit | Attending: Cardiovascular Disease

## 2017-07-01 ENCOUNTER — Ambulatory Visit (HOSPITAL_COMMUNITY)
Admission: RE | Admit: 2017-07-01 | Discharge: 2017-07-02 | Disposition: A | Discharge status: Home / Self Care | Payer: Medicare Other | Source: Ambulatory Visit | Attending: Cardiovascular Disease | Admitting: Cardiovascular Disease

## 2017-07-01 ENCOUNTER — Other Ambulatory Visit: Payer: Self-pay

## 2017-07-01 ENCOUNTER — Encounter (HOSPITAL_COMMUNITY): Payer: Self-pay | Admitting: General Practice

## 2017-07-01 DIAGNOSIS — Z7982 Long term (current) use of aspirin: Secondary | ICD-10-CM | POA: Insufficient documentation

## 2017-07-01 DIAGNOSIS — K219 Gastro-esophageal reflux disease without esophagitis: Secondary | ICD-10-CM | POA: Diagnosis not present

## 2017-07-01 DIAGNOSIS — H9190 Unspecified hearing loss, unspecified ear: Secondary | ICD-10-CM | POA: Insufficient documentation

## 2017-07-01 DIAGNOSIS — M109 Gout, unspecified: Secondary | ICD-10-CM | POA: Diagnosis not present

## 2017-07-01 DIAGNOSIS — I25110 Atherosclerotic heart disease of native coronary artery with unstable angina pectoris: Secondary | ICD-10-CM | POA: Diagnosis not present

## 2017-07-01 DIAGNOSIS — I429 Cardiomyopathy, unspecified: Secondary | ICD-10-CM | POA: Insufficient documentation

## 2017-07-01 DIAGNOSIS — Z7902 Long term (current) use of antithrombotics/antiplatelets: Secondary | ICD-10-CM | POA: Diagnosis not present

## 2017-07-01 DIAGNOSIS — I200 Unstable angina: Secondary | ICD-10-CM | POA: Diagnosis present

## 2017-07-01 DIAGNOSIS — I25710 Atherosclerosis of autologous vein coronary artery bypass graft(s) with unstable angina pectoris: Secondary | ICD-10-CM

## 2017-07-01 DIAGNOSIS — Z9842 Cataract extraction status, left eye: Secondary | ICD-10-CM | POA: Insufficient documentation

## 2017-07-01 DIAGNOSIS — Z9841 Cataract extraction status, right eye: Secondary | ICD-10-CM | POA: Insufficient documentation

## 2017-07-01 DIAGNOSIS — I493 Ventricular premature depolarization: Secondary | ICD-10-CM | POA: Insufficient documentation

## 2017-07-01 DIAGNOSIS — Z79899 Other long term (current) drug therapy: Secondary | ICD-10-CM | POA: Diagnosis not present

## 2017-07-01 DIAGNOSIS — I10 Essential (primary) hypertension: Secondary | ICD-10-CM | POA: Diagnosis present

## 2017-07-01 DIAGNOSIS — I25810 Atherosclerosis of coronary artery bypass graft(s) without angina pectoris: Secondary | ICD-10-CM | POA: Diagnosis present

## 2017-07-01 DIAGNOSIS — I2510 Atherosclerotic heart disease of native coronary artery without angina pectoris: Secondary | ICD-10-CM | POA: Diagnosis present

## 2017-07-01 DIAGNOSIS — Z888 Allergy status to other drugs, medicaments and biological substances status: Secondary | ICD-10-CM | POA: Diagnosis not present

## 2017-07-01 DIAGNOSIS — Z7901 Long term (current) use of anticoagulants: Secondary | ICD-10-CM | POA: Diagnosis not present

## 2017-07-01 DIAGNOSIS — E782 Mixed hyperlipidemia: Secondary | ICD-10-CM | POA: Diagnosis present

## 2017-07-01 DIAGNOSIS — Z951 Presence of aortocoronary bypass graft: Secondary | ICD-10-CM | POA: Diagnosis not present

## 2017-07-01 DIAGNOSIS — Z955 Presence of coronary angioplasty implant and graft: Secondary | ICD-10-CM

## 2017-07-01 DIAGNOSIS — Z87891 Personal history of nicotine dependence: Secondary | ICD-10-CM | POA: Insufficient documentation

## 2017-07-01 HISTORY — DX: Gastro-esophageal reflux disease without esophagitis: K21.9

## 2017-07-01 HISTORY — DX: Acute myocardial infarction, unspecified: I21.9

## 2017-07-01 HISTORY — DX: Personal history of other medical treatment: Z92.89

## 2017-07-01 HISTORY — DX: Unspecified osteoarthritis, unspecified site: M19.90

## 2017-07-01 HISTORY — DX: Pure hypercholesterolemia, unspecified: E78.00

## 2017-07-01 HISTORY — DX: Unspecified malignant neoplasm of skin, unspecified: C44.90

## 2017-07-01 HISTORY — PX: CORONARY ANGIOPLASTY WITH STENT PLACEMENT: SHX49

## 2017-07-01 HISTORY — PX: CORONARY STENT INTERVENTION: CATH118234

## 2017-07-01 HISTORY — PX: LEFT HEART CATH AND CORS/GRAFTS ANGIOGRAPHY: CATH118250

## 2017-07-01 LAB — POCT ACTIVATED CLOTTING TIME
ACTIVATED CLOTTING TIME: 516 s
ACTIVATED CLOTTING TIME: 158 s

## 2017-07-01 SURGERY — LEFT HEART CATH AND CORS/GRAFTS ANGIOGRAPHY
Anesthesia: LOCAL

## 2017-07-01 MED ORDER — ANGIOPLASTY BOOK
Freq: Once | Status: AC
  Administered 2017-07-01
  Filled 2017-07-01 (×2): qty 1

## 2017-07-01 MED ORDER — IOPAMIDOL (ISOVUE-370) INJECTION 76%
INTRAVENOUS | Status: AC
  Filled 2017-07-01: qty 125

## 2017-07-01 MED ORDER — MIDAZOLAM HCL 2 MG/2ML IJ SOLN
INTRAMUSCULAR | Status: AC
  Filled 2017-07-01: qty 2

## 2017-07-01 MED ORDER — TIROFIBAN HCL IN NACL 5-0.9 MG/100ML-% IV SOLN
INTRAVENOUS | Status: DC | PRN
  Administered 2017-07-01: 0.075 ug/kg/min via INTRAVENOUS

## 2017-07-01 MED ORDER — LIDOCAINE HCL 1 % IJ SOLN
INTRAMUSCULAR | Status: AC
  Filled 2017-07-01: qty 20

## 2017-07-01 MED ORDER — SODIUM CHLORIDE 0.9% FLUSH: 3.0000 mL | Freq: Two times a day (BID) | INTRAVENOUS | Status: DC

## 2017-07-01 MED ORDER — MIDAZOLAM HCL 2 MG/2ML IJ SOLN
INTRAMUSCULAR | Status: DC | PRN
  Administered 2017-07-01 (×2): 1 mg via INTRAVENOUS

## 2017-07-01 MED ORDER — TIROFIBAN (AGGRASTAT) BOLUS VIA INFUSION
INTRAVENOUS | Status: DC | PRN
  Administered 2017-07-01: 1587.5 ug via INTRAVENOUS

## 2017-07-01 MED ORDER — BIVALIRUDIN TRIFLUOROACETATE 250 MG IV SOLR
INTRAVENOUS | Status: AC
  Filled 2017-07-01: qty 250

## 2017-07-01 MED ORDER — ISOSORBIDE MONONITRATE ER 30 MG PO TB24
30.0000 mg | ORAL_TABLET | Freq: Every day | ORAL | Status: DC
  Administered 2017-07-02: 30 mg via ORAL
  Filled 2017-07-01: qty 1

## 2017-07-01 MED ORDER — ACETAMINOPHEN 325 MG PO TABS: 650.0000 mg | ORAL_TABLET | ORAL | Status: DC | PRN

## 2017-07-01 MED ORDER — FENTANYL CITRATE (PF) 100 MCG/2ML IJ SOLN
INTRAMUSCULAR | Status: AC
  Filled 2017-07-01: qty 2

## 2017-07-01 MED ORDER — BIVALIRUDIN BOLUS VIA INFUSION - CUPID
INTRAVENOUS | Status: DC | PRN
  Administered 2017-07-01: 47.625 mg via INTRAVENOUS

## 2017-07-01 MED ORDER — FENTANYL CITRATE (PF) 100 MCG/2ML IJ SOLN
INTRAMUSCULAR | Status: DC | PRN
  Administered 2017-07-01 (×2): 25 ug via INTRAVENOUS

## 2017-07-01 MED ORDER — TIROFIBAN HCL IN NACL 5-0.9 MG/100ML-% IV SOLN: INTRAVENOUS | Status: DC | PRN

## 2017-07-01 MED ORDER — TIROFIBAN HCL IN NACL 5-0.9 MG/100ML-% IV SOLN
INTRAVENOUS | Status: AC
  Filled 2017-07-01: qty 100

## 2017-07-01 MED ORDER — SODIUM CHLORIDE 0.9 % IV SOLN: INTRAVENOUS | Status: AC

## 2017-07-01 MED ORDER — CLOPIDOGREL BISULFATE 75 MG PO TABS
75.0000 mg | ORAL_TABLET | Freq: Every day | ORAL | Status: DC
  Administered 2017-07-02: 75 mg via ORAL
  Filled 2017-07-01: qty 1

## 2017-07-01 MED ORDER — NITROGLYCERIN 0.4 MG SL SUBL: 0.4000 mg | SUBLINGUAL_TABLET | SUBLINGUAL | Status: DC | PRN

## 2017-07-01 MED ORDER — LIDOCAINE HCL (PF) 1 % IJ SOLN
INTRAMUSCULAR | Status: DC | PRN
  Administered 2017-07-01: 15 mL

## 2017-07-01 MED ORDER — SODIUM CHLORIDE 0.9 % IV SOLN: 250.0000 mL | INTRAVENOUS | Status: DC | PRN

## 2017-07-01 MED ORDER — TAMSULOSIN HCL 0.4 MG PO CAPS
0.4000 mg | ORAL_CAPSULE | Freq: Every day | ORAL | Status: DC
  Administered 2017-07-01: 22:00:00 0.4 mg via ORAL
  Filled 2017-07-01: qty 1

## 2017-07-01 MED ORDER — HEPARIN (PORCINE) IN NACL 2-0.9 UNIT/ML-% IJ SOLN
INTRAMUSCULAR | Status: AC
  Filled 2017-07-01: qty 1000

## 2017-07-01 MED ORDER — IOPAMIDOL (ISOVUE-370) INJECTION 76%
INTRAVENOUS | Status: DC | PRN
  Administered 2017-07-01: 185 mL

## 2017-07-01 MED ORDER — SODIUM CHLORIDE 0.9 % WEIGHT BASED INFUSION: 1.0000 mL/kg/h | INTRAVENOUS | Status: DC

## 2017-07-01 MED ORDER — SODIUM CHLORIDE 0.9% FLUSH: 3.0000 mL | INTRAVENOUS | Status: DC | PRN

## 2017-07-01 MED ORDER — HYDRALAZINE HCL 20 MG/ML IJ SOLN: 5.0000 mg | INTRAMUSCULAR | Status: AC | PRN

## 2017-07-01 MED ORDER — SODIUM CHLORIDE 0.9 % IV SOLN
INTRAVENOUS | Status: DC | PRN
  Administered 2017-07-01: 1.75 mg/kg/h via INTRAVENOUS

## 2017-07-01 MED ORDER — ONDANSETRON HCL 4 MG/2ML IJ SOLN: 4.0000 mg | Freq: Four times a day (QID) | INTRAMUSCULAR | Status: DC | PRN

## 2017-07-01 MED ORDER — LABETALOL HCL 5 MG/ML IV SOLN
10.0000 mg | INTRAVENOUS | Status: AC | PRN
  Administered 2017-07-01: 10 mg via INTRAVENOUS
  Filled 2017-07-01: qty 4

## 2017-07-01 MED ORDER — SODIUM CHLORIDE 0.9% FLUSH
3.0000 mL | Freq: Two times a day (BID) | INTRAVENOUS | Status: DC
  Administered 2017-07-01: 16:00:00 3 mL via INTRAVENOUS

## 2017-07-01 MED ORDER — FAMOTIDINE 20 MG PO TABS
40.0000 mg | ORAL_TABLET | Freq: Every day | ORAL | Status: DC
  Administered 2017-07-01: 22:00:00 40 mg via ORAL
  Filled 2017-07-01: qty 2

## 2017-07-01 MED ORDER — METOPROLOL TARTRATE 12.5 MG HALF TABLET
12.5000 mg | ORAL_TABLET | Freq: Two times a day (BID) | ORAL | Status: DC
  Administered 2017-07-01 – 2017-07-02 (×2): 12.5 mg via ORAL
  Filled 2017-07-01 (×2): qty 1

## 2017-07-01 MED ORDER — ATORVASTATIN CALCIUM 10 MG PO TABS
10.0000 mg | ORAL_TABLET | Freq: Every day | ORAL | Status: DC
  Administered 2017-07-02: 09:00:00 10 mg via ORAL
  Filled 2017-07-01: qty 1

## 2017-07-01 MED ORDER — ASPIRIN 81 MG PO CHEW: 81.0000 mg | CHEWABLE_TABLET | ORAL | Status: DC

## 2017-07-01 MED ORDER — SODIUM CHLORIDE 0.9 % WEIGHT BASED INFUSION
3.0000 mL/kg/h | INTRAVENOUS | Status: DC
  Administered 2017-07-01: 3 mL/kg/h via INTRAVENOUS

## 2017-07-01 MED ORDER — HEPARIN (PORCINE) IN NACL 2-0.9 UNIT/ML-% IJ SOLN
INTRAMUSCULAR | Status: DC | PRN
  Administered 2017-07-01: 12:00:00

## 2017-07-01 SURGICAL SUPPLY — 20 items
CATH INFINITI 5 FR IM (CATHETERS) ×2 IMPLANT
CATH INFINITI 5 FR LCB (CATHETERS) ×2 IMPLANT
CATH INFINITI 5FR MULTPACK ANG (CATHETERS) ×2 IMPLANT
CATH LAUNCHER 6FR AL1 (CATHETERS) ×1 IMPLANT
CATHETER LAUNCHER 6FR AL1 (CATHETERS) ×2
KIT ENCORE 26 ADVANTAGE (KITS) ×2 IMPLANT
KIT HEART LEFT (KITS) ×2 IMPLANT
KIT HEMO VALVE WATCHDOG (MISCELLANEOUS) ×2 IMPLANT
PACK CARDIAC CATHETERIZATION (CUSTOM PROCEDURE TRAY) ×2 IMPLANT
SHEATH PINNACLE 5F 10CM (SHEATH) ×2 IMPLANT
SHEATH PINNACLE 6F 10CM (SHEATH) ×2 IMPLANT
STENT SYNERGY DES 3X16 (Permanent Stent) ×2 IMPLANT
STENT SYNERGY DES 3X20 (Permanent Stent) ×2 IMPLANT
SYR MEDRAD MARK V 150ML (SYRINGE) ×2 IMPLANT
TRANSDUCER W/STOPCOCK (MISCELLANEOUS) ×2 IMPLANT
TUBING CIL FLEX 10 FLL-RA (TUBING) ×2 IMPLANT
WIRE COUGAR XT STRL 190CM (WIRE) ×2 IMPLANT
WIRE EMERALD 3MM-J .025X260CM (WIRE) ×2 IMPLANT
WIRE EMERALD 3MM-J .035X150CM (WIRE) ×2 IMPLANT
WIRE EMERALD ST .035X260CM (WIRE) ×2 IMPLANT

## 2017-07-01 NOTE — Progress Notes (Signed)
Site area: right groin  Site Prior to Removal:  Level 0  Pressure Applied For 20 MINUTES    Minutes Beginning at 1650  Manual:   Yes.    Patient Status During Pull:  AAO X 4  Post Pull Groin Site:  Level 0  Post Pull Instructions Given:  Yes.    Post Pull Pulses Present:  Yes.    Dressing Applied:  Yes.    Comments:  Tolerated procedure well

## 2017-07-01 NOTE — Interval H&P Note (Signed)
History and Physical Interval Note:  07/01/2017 11:56 AM  Billy [name]  has presented today for cardiac cath with the diagnosis of unstable angina  The various methods of treatment have been discussed with the patient and family. After consideration of risks, benefits and other options for treatment, the patient has consented to  Procedure(s): LEFT HEART CATH AND CORS/GRAFTS ANGIOGRAPHY (N/A) as a surgical intervention .  The patient's history has been reviewed, patient examined, no change in status, stable for surgery.  I have reviewed the patient's chart and labs.  Questions were answered to the patient's satisfaction.    Cath Lab Visit (complete for each Cath Lab visit)  Clinical Evaluation Leading to the Procedure:   ACS: No.  Non-ACS:    Anginal Classification: CCS III  Anti-ischemic medical therapy: Maximal Therapy (2 or more classes of medications)  Non-Invasive Test Results: No non-invasive testing performed  Prior CABG: Previous CABG         Lauree Chandler

## 2017-07-02 DIAGNOSIS — I25810 Atherosclerosis of coronary artery bypass graft(s) without angina pectoris: Secondary | ICD-10-CM | POA: Diagnosis not present

## 2017-07-02 DIAGNOSIS — I429 Cardiomyopathy, unspecified: Secondary | ICD-10-CM | POA: Diagnosis not present

## 2017-07-02 DIAGNOSIS — I10 Essential (primary) hypertension: Secondary | ICD-10-CM | POA: Diagnosis not present

## 2017-07-02 DIAGNOSIS — M109 Gout, unspecified: Secondary | ICD-10-CM | POA: Diagnosis not present

## 2017-07-02 DIAGNOSIS — Z9842 Cataract extraction status, left eye: Secondary | ICD-10-CM | POA: Diagnosis not present

## 2017-07-02 DIAGNOSIS — Z888 Allergy status to other drugs, medicaments and biological substances status: Secondary | ICD-10-CM | POA: Diagnosis not present

## 2017-07-02 DIAGNOSIS — H9190 Unspecified hearing loss, unspecified ear: Secondary | ICD-10-CM | POA: Diagnosis not present

## 2017-07-02 DIAGNOSIS — I200 Unstable angina: Secondary | ICD-10-CM | POA: Diagnosis not present

## 2017-07-02 DIAGNOSIS — Z7901 Long term (current) use of anticoagulants: Secondary | ICD-10-CM | POA: Diagnosis not present

## 2017-07-02 DIAGNOSIS — K219 Gastro-esophageal reflux disease without esophagitis: Secondary | ICD-10-CM | POA: Diagnosis not present

## 2017-07-02 DIAGNOSIS — Z87891 Personal history of nicotine dependence: Secondary | ICD-10-CM | POA: Diagnosis not present

## 2017-07-02 DIAGNOSIS — I493 Ventricular premature depolarization: Secondary | ICD-10-CM | POA: Diagnosis not present

## 2017-07-02 DIAGNOSIS — I25110 Atherosclerotic heart disease of native coronary artery with unstable angina pectoris: Secondary | ICD-10-CM | POA: Diagnosis not present

## 2017-07-02 DIAGNOSIS — Z7982 Long term (current) use of aspirin: Secondary | ICD-10-CM | POA: Diagnosis not present

## 2017-07-02 DIAGNOSIS — Z79899 Other long term (current) drug therapy: Secondary | ICD-10-CM | POA: Diagnosis not present

## 2017-07-02 DIAGNOSIS — Z951 Presence of aortocoronary bypass graft: Secondary | ICD-10-CM | POA: Diagnosis not present

## 2017-07-02 DIAGNOSIS — Z955 Presence of coronary angioplasty implant and graft: Secondary | ICD-10-CM | POA: Diagnosis not present

## 2017-07-02 DIAGNOSIS — Z9841 Cataract extraction status, right eye: Secondary | ICD-10-CM | POA: Diagnosis not present

## 2017-07-02 DIAGNOSIS — E782 Mixed hyperlipidemia: Secondary | ICD-10-CM | POA: Diagnosis not present

## 2017-07-02 DIAGNOSIS — Z7902 Long term (current) use of antithrombotics/antiplatelets: Secondary | ICD-10-CM | POA: Diagnosis not present

## 2017-07-02 LAB — BASIC METABOLIC PANEL
Anion gap: 10 (ref 5–15)
BUN: 18 mg/dL (ref 6–20)
CO2: 23 mmol/L (ref 22–32)
CREATININE: 1.06 mg/dL (ref 0.61–1.24)
Calcium: 9 mg/dL (ref 8.9–10.3)
Chloride: 106 mmol/L (ref 101–111)
GFR calc Af Amer: >60 mL/min (ref >60)
GFR calc non Af Amer: >60 mL/min (ref >60)
GLUCOSE: 101 mg/dL — AB (ref 65–99)
Potassium: 3.8 mmol/L (ref 3.5–5.1)
Sodium: 139 mmol/L (ref 135–145)

## 2017-07-02 LAB — CBC
HCT: 38.4 % — ABNORMAL LOW (ref 39.0–52.0)
Hemoglobin: 12.4 g/dL — ABNORMAL LOW (ref 13.0–17.0)
MCH: 28.9 pg (ref 26.0–34.0)
MCHC: 32.3 g/dL (ref 30.0–36.0)
MCV: 89.5 fL (ref 78.0–100.0)
PLATELETS: 213 10*3/uL (ref 150–400)
RBC: 4.29 MIL/uL (ref 4.22–5.81)
RDW: 13 % (ref 11.5–15.5)
WBC: 5.2 10*3/uL (ref 4.0–10.5)

## 2017-07-02 MED ORDER — LOSARTAN POTASSIUM 25 MG PO TABS
12.5000 mg | ORAL_TABLET | Freq: Every day | ORAL | Status: DC
  Administered 2017-07-02: 09:00:00 12.5 mg via ORAL
  Filled 2017-07-02: qty 0.5

## 2017-07-02 MED ORDER — ASPIRIN EC 81 MG PO TBEC
81.0000 mg | DELAYED_RELEASE_TABLET | Freq: Every day | ORAL | Status: DC
  Administered 2017-07-02: 09:00:00 81 mg via ORAL
  Filled 2017-07-02: qty 1

## 2017-07-02 MED ORDER — ASPIRIN 81 MG PO TBEC: 81.0000 mg | DELAYED_RELEASE_TABLET | Freq: Every day | ORAL | Status: DC

## 2017-07-02 MED ORDER — LOSARTAN POTASSIUM 25 MG PO TABS: 12.5000 mg | ORAL_TABLET | Freq: Every day | ORAL | 5 refills | Status: DC

## 2017-07-02 NOTE — Discharge Summary (Signed)
Discharge Summary    Patient ID: Billy [name],  MRN: [id number], DOB/AGE: 1934/07/12 83 y.o.  Admit date: 07/01/2017 Discharge date: 07/02/2017  Primary Care Provider: Curlene Labrum Primary Cardiologist: Dr. Domenic Polite  Discharge Diagnoses    Principal Problem:   Unstable angina [facility]) Active Problems:   Mixed hyperlipidemia   Essential hypertension   CAD (coronary artery disease) of artery bypass graft   History of Present Illness     Billy [name] is a 83 y.o. male with past medical history of CAD (s/p CABG with DESx2 to SVG-OM1 and staged DES to SVG-RCA in 04/2014, DES to SVG-PDA in 05/2015 and DESx2 to SVG-OM1-OM2 in 04/2016), HTN, HLD, and GERD who presented to [facility] on 07/01/2017 for planned cardiac catheterization.   He was evaluated by Dr. Domenic Polite on 06/29/2017 and reported having episodes of chest pain over the past 24 hours which would radiate into his neck and upper arms and resolve with SL NTG. With his known CAD and multiple PCI procedures, a repeat cardiac catheterization was recommended for definitive evaluation.   Hospital Course     Consultants: None  His cardiac catheterization showed severe triple vessel CAD with 5/5 patent grafts. The vein graft to the RCA has a moderately severe stenosis in the mid body of the graft and the distal stented segment of the graft had 99% stenosis, therefore this was treated with PTCA/DESx2 to the mid and distal body of SVG-RCA. LVEF was estimated to be reduced to 40%. He will continue on DAPT with ASA and Plavix along with BB and statin therapy.   Overnight and the following morning, he denied any recurrent chest pain or dyspnea. Telemetry showed NSR with episodes of known bradycardia with HR in the 40's at times and occasional PVC's. His groin site was stable without any ecchymosis or evidence of a hematoma. Kidney function and Hgb remained stable.   He was last examined by Dr. Stanford Breed and deemed stable for discharge. He  was started on Losartan 12.5mg  daily in the setting of his cardiomyopathy. He was discharged home in good condition. Close outpatient follow-up has been arranged.    ____________  Discharge Physical Exam and Vitals Blood pressure (!) 156/63, pulse (!) 55, temperature 97.9 F (36.6 C), temperature source Oral, resp. rate 18, height 5' 7.5" (1.715 m), weight 138 lb 14.2 oz (63 kg), SpO2 97 %.  Filed Weights   07/01/17 0945 07/02/17 0456  Weight: 140 lb (63.5 kg) 138 lb 14.2 oz (63 kg)   General: Pleasant Caucasian male appearing in NAD Psych: Normal affect. Neuro: Alert and oriented X 3. Moves all extremities spontaneously. HEENT: Normal  Neck: Supple without bruits or JVD. Lungs:  Resp regular and unlabored, CTA without wheezing or rales. Heart: RRR no s3, s4, or murmurs. Abdomen: Soft, non-tender, non-distended, BS + x 4.  Extremities: No clubbing, cyanosis or edema. DP/PT/Radials 2+ and equal bilaterally. Groin site stable with no ecchymosis or evidence of a hematoma.    Labs & Radiologic Studies     CBC Recent Labs    07/02/17 0607  WBC 5.2  HGB 12.4*  HCT 38.4*  MCV 89.5  PLT 517   Basic Metabolic Panel Recent Labs    07/02/17 0607  NA 139  K 3.8  CL 106  CO2 23  GLUCOSE 101*  BUN 18  CREATININE 1.06  CALCIUM 9.0   Liver Function Tests No results for input(s): AST, ALT, ALKPHOS, BILITOT, PROT, ALBUMIN in the  last 72 hours. No results for input(s): LIPASE, AMYLASE in the last 72 hours. Cardiac Enzymes No results for input(s): CKTOTAL, CKMB, CKMBINDEX, TROPONINI in the last 72 hours. BNP Invalid input(s): POCBNP D-Dimer No results for input(s): DDIMER in the last 72 hours. Hemoglobin A1C No results for input(s): HGBA1C in the last 72 hours. Fasting Lipid Panel No results for input(s): CHOL, HDL, LDLCALC, TRIG, CHOLHDL, LDLDIRECT in the last 72 hours. Thyroid Function Tests No results for input(s): TSH, T4TOTAL, T3FREE, THYROIDAB in the last 72  hours.  Invalid input(s): FREET3  No results found.   Diagnostic Studies/Procedures     Cardiac Catheterization: 07/01/2017  Ost LM to Mid LM lesion is 50% stenosed.  Ost LAD to Prox LAD lesion is 90% stenosed.  LIMA graft was visualized by angiography and is normal in caliber.  Ost Cx to Prox Cx lesion is 100% stenosed.  SVG graft was visualized by angiography and is normal in caliber.  Mid RCA lesion is 100% stenosed.  SVG graft was visualized by angiography and is normal in caliber.  Dist Graft to Insertion lesion is 99% stenosed.  A drug-eluting stent was successfully placed using a STENT SYNERGY DES 3X16.  Post intervention, there is a 0% residual stenosis.  A drug-eluting stent was successfully placed.  Mid Graft lesion is 70% stenosed.  Post intervention, there is a 0% residual stenosis.  Prox Graft to Dist Graft lesion before 1st Mrg is 30% stenosed.  Mid Graft lesion between 1st Mrg and 2nd Mrg is 40% stenosed.  Mid Graft lesion is 40% stenosed.  Origin lesion is 30% stenosed.  There is mild to moderate left ventricular systolic dysfunction.  LV end diastolic pressure is normal.  The left ventricular ejection fraction is 35-45% by visual estimate.   1. Severe triple vessel CAD s/p 5 V CABG with 5/5 patent bypass grafts 2. The LAD has a high grade proximal stenosis, unchanged. The LIMA graft is patent to the mid LAD. The vein graft is patent to the Diagonal.  3. The Circumflex is occluded proximally. The vein graft to the OM1/OM2 is patent. The stents in this vein graft have mild restenosis.  4. The RCA is occluded in the mid segment. The vein graft to the RCA has a moderately severe stenosis in the mid body of the graft and the distal stented segment of the graft has 99% stenosis.  5. Moderate LV systolic function, LVEF estimated at 40%.  6. Successful PTCA/DES x 2 mid and distal body of SVG to RCA. No distal protection used as the distal lesion as  within the distal stent and there was no good landing zone.   Recommendations: Continue DAPT with ASA and Plavix, continue beta blocker and statin.   Disposition   Pt is being discharged home today in good condition.  Follow-up Plans & Appointments    Follow-up Information    Erma Heritage, PA-C Follow up on 07/15/2017.   Specialties:  Physician Assistant, Cardiology Why:  Cardiology Hospital Follow-up on 07/15/2017 at 3:30 PM with Bernerd Pho (Dr. McDowell's PA) Contact information: [address] [place] [address] [phone]          Discharge Instructions    Diet - low sodium heart healthy   Complete by:  As directed    Diet - low sodium heart healthy   Complete by:  As directed    Discharge instructions   Complete by:  As directed    PLEASE REMEMBER TO BRING ALL  OF YOUR MEDICATIONS TO EACH OF YOUR FOLLOW-UP OFFICE VISITS.  PLEASE ATTEND ALL SCHEDULED FOLLOW-UP APPOINTMENTS.   Activity: Increase activity slowly as tolerated. You may shower, but no soaking baths (or swimming) for 1 week. No driving for 24 hours. No lifting over 5 lbs for 1 week. No sexual activity for 1 week.   You May Return to Work: in 1 week (if applicable)  Wound Care: You may wash cath site gently with soap and water. Keep cath site clean and dry. If you notice pain, swelling, bleeding or pus at your cath site, please call [phone].   Increase activity slowly   Complete by:  As directed    Increase activity slowly   Complete by:  As directed       Discharge Medications     Medication List    TAKE these medications   aspirin 81 MG EC tablet Take 1 tablet (81 mg total) by mouth daily.   atorvastatin 10 MG tablet Commonly known as:  LIPITOR TAKE 1 TABLET BY MOUTH  DAILY   clopidogrel 75 MG tablet Commonly known as:  PLAVIX TAKE 1 TABLET BY MOUTH  DAILY   COLCRYS 0.6 MG tablet Generic drug:  colchicine Take 1 tablet by mouth Once daily as needed (for gout).    cyclobenzaprine 10 MG tablet Commonly known as:  FLEXERIL Take 1 tablet (10 mg total) by mouth 3 times/day as needed-between meals & bedtime for muscle spasms.   famotidine 40 MG tablet Commonly known as:  PEPCID TAKE 1 TABLET BY MOUTH AT  BEDTIME   ferrous sulfate 325 (65 FE) MG tablet Take 325 mg by mouth every other day.   fluticasone 50 MCG/ACT nasal spray Commonly known as:  FLONASE Place 1 spray into both nostrils as needed for allergies or rhinitis.   isosorbide mononitrate 60 MG 24 hr tablet Commonly known as:  IMDUR TAKE ONE-HALF TABLET BY  MOUTH DAILY   losartan 25 MG tablet Commonly known as:  COZAAR Take 0.5 tablets (12.5 mg total) by mouth daily.   Magnesium 250 MG Tabs Take 1 tablet by mouth daily.   metoprolol tartrate 25 MG tablet Commonly known as:  LOPRESSOR Take 0.5 tablets (12.5 mg total) by mouth 2 (two) times daily.   nitroGLYCERIN 0.4 MG SL tablet Commonly known as:  NITROSTAT Place 1 tablet (0.4 mg total) under the tongue every 5 (five) minutes x 3 doses as needed. If no relief after 3rd dose, proceed to the ED for an evalutation   tamsulosin 0.4 MG Caps capsule Commonly known as:  FLOMAX Take 0.4 mg by mouth at bedtime.   vitamin B-12 500 MCG tablet Commonly known as:  CYANOCOBALAMIN Take 500 mcg by mouth daily.       Aspirin prescribed at discharge?  Yes High Intensity Statin Prescribed? (Lipitor 40-80mg  or Crestor 20-40mg ): No: At goal on current dosing Beta Blocker Prescribed? Yes For EF 40% or less, Was ACEI/ARB Prescribed? Yes ADP Receptor Inhibitor Prescribed? (i.e. Plavix etc.-Includes Medically Managed Patients): Yes For EF <40%, Aldosterone Inhibitor Prescribed? No: EF > 40% Was EF assessed during THIS hospitalization? Yes Was Cardiac Rehab II ordered? (Included Medically managed Patients): Yes   Allergies Allergies  Allergen Reactions  . Protonix [Pantoprazole Sodium] Itching and Rash     Outstanding Labs/Studies    BMET in 2 weeks.   Duration of Discharge Encounter   Greater than 30 minutes including physician time.  Signed, Erma Heritage, PA-C 07/02/2017, 7:49 AM As above, patient  seen and examined.  Patient denies chest pain, dyspnea.  Femoral cath site with no hematoma or bruit.  Plan to discharge today on aspirin, Plavix and statin.  Ejection fraction 40% at time of catheterization.  Continue beta-blocker.  Add ARB.  Would repeat echocardiogram in 6-8 weeks to reassess LV function.  Follow-up with Dr. Domenic Polite 4-6 weeks in [place].  Greater than 30 minutes PA and physician time. D2 Kirk Ruths, MD

## 2017-07-02 NOTE — Progress Notes (Signed)
CARDIAC REHAB PHASE I   [phone]  Pt ambulated with RN. VSS. Completed education with patient and express to patient the importance of walking and physical activity for blood flow/stent protection in conjunction with medication management and nutrition. Pt verbalized understanding. Further discussed risk factors, activity restrictions, HH diet, stent cards, antiplatelet therapy, NTG use and emergency/temperautre precautions. Pt did teach back for antiplatelet therapy and exercise guidelines. Pt is not interested in CRPII due to high copay. Strongly encouraged patient to walk and exercise for cardiac health. Pt agreed.   Billy [name] D Nora Sabey,MS,ACSM-RCEP 07/02/2017 9:37 AM

## 2017-07-03 ENCOUNTER — Encounter (HOSPITAL_COMMUNITY): Payer: Self-pay | Admitting: Cardiovascular Disease

## 2017-07-04 MED FILL — Lidocaine HCl Local Inj 1%: INTRAMUSCULAR | Qty: 20 | Status: AC

## 2017-07-07 ENCOUNTER — Other Ambulatory Visit: Payer: Self-pay | Admitting: Cardiology

## 2017-07-11 DIAGNOSIS — I10 Essential (primary) hypertension: Secondary | ICD-10-CM | POA: Diagnosis not present

## 2017-07-11 DIAGNOSIS — D500 Iron deficiency anemia secondary to blood loss (chronic): Secondary | ICD-10-CM | POA: Diagnosis not present

## 2017-07-11 DIAGNOSIS — I25812 Atherosclerosis of bypass graft of coronary artery of transplanted heart without angina pectoris: Secondary | ICD-10-CM | POA: Diagnosis not present

## 2017-07-11 DIAGNOSIS — D649 Anemia, unspecified: Secondary | ICD-10-CM | POA: Diagnosis not present

## 2017-07-11 DIAGNOSIS — I77811 Abdominal aortic ectasia: Secondary | ICD-10-CM | POA: Diagnosis not present

## 2017-07-13 ENCOUNTER — Telehealth: Payer: Self-pay | Admitting: Cardiovascular Disease

## 2017-07-13 NOTE — Telephone Encounter (Signed)
Billy [name] states cath was not covered by insurance. Will send to scheduling.

## 2017-07-13 NOTE — Telephone Encounter (Signed)
[facility], calling in reference to left heart cath. Please call Darlene to discuss.

## 2017-07-14 NOTE — Progress Notes (Signed)
Cardiology Office Note    Date:  07/15/2017   ID:  Billy [name], DOB 05/18/1935, MRN [id number]  PCP:  Curlene Labrum, MD  Cardiologist: Dr. Domenic Polite   Chief Complaint  Patient presents with  . Follow-up    s/p cardiac catheterization    History of Present Illness:    Billy [name] is a 83 y.o. male with past medical history of CAD (s/p CABG in 2001, DESx2 to SVG-OM1 and staged DES to SVG-RCA in 04/2014, DES to SVG-PDA in 05/2015 and DESx2 to SVG-OM1-OM2 in 04/2016), HTN, HLD, and GERD who presents to the office today for hospital follow-up.   He was recently admitted to [facility] from 1/25 to 07/02/2017 for a planned cardiac catheterization in the setting of unstable angina. His catheterization showed severe triple-vessel CAD. The vein graft to the RCA has a moderately severe stenosis in the mid body of the graft and the distal stented segment of the graft had 99% stenosis, therefore this was treated with PTCA/DESx2 to the mid and distal body of SVG-RCA. LVEF was estimated to be reduced to 40%.  He was started on dual antiplatelet therapy with aspirin and Plavix along with being continued on beta-blocker and statin therapy. Due to his cardiomyopathy, Losartan 12.5 mg daily was added to his medication regimen. A repeat echocardiogram was recommended in 6-8 weeks to reassess LV function.  In talking with the patient today, he reports doing well since his recent stent placement. Denies any repeat episodes of chest pain or dyspnea on exertion. Has been working outdoors without any recurrent anginal symptoms. No recent orthopnea, PND, lower extremity edema, or palpitations.   He does not check his BP regularly at home but it is elevated to 142/68 during today's visit.    Past Medical History:  Diagnosis Date  . Arthritis    "fingers, toes" (07/01/2017)  . Carotid artery disease ([place])    78-46% LICA and 9-62% RICA - December 2013  . Coronary atherosclerosis of native coronary artery    a. /p CABG in 2001 b. DESx2 to SVG-OM1 and staged DES to SVG-RCA in 04/2014 c. DES to SVG-PDA in 05/2015 d. DESx2 to SVG-OM1-OM2 in 04/2016 e. 06/2017: DESx2 to mid and distal body of SVG-RCA  . Essential hypertension, benign   . GERD (gastroesophageal reflux disease)   . Gout   . High cholesterol   . History of blood transfusion 2017   "related to blood thinner"  . HOH (hard of hearing)   . Myocardial infarction [facility]) 2017   "small one and a big one before that" (07/01/2017)  . Sinus bradycardia    Asymptomatic  . Skin cancer    "burned off; behind his ear"    Past Surgical History:  Procedure Laterality Date  . APPENDECTOMY     Ruptured  . CARDIAC CATHETERIZATION N/A 05/19/2015   Procedure: Left Heart Cath and Cors/Grafts Angiography;  Surgeon: Lorretta Harp, MD;  Location: [place] CV LAB;  Service: Cardiovascular;  Laterality: N/A;  . CARDIAC CATHETERIZATION N/A 05/19/2015   Procedure: Coronary Stent Intervention;  Surgeon: Lorretta Harp, MD;  Location: [place] CV [facility];  Service: Cardiovascular;  Laterality: N/A;  . CARDIAC CATHETERIZATION N/A 04/23/2016   Procedure: LEFT HEART CATH AND CORS/GRAFTS ANGIOGRAPHY;  Surgeon: Leonie Man, MD;  Location: [place] [facility];  Service: Cardiovascular;  Laterality: N/A;  . CARDIAC CATHETERIZATION N/A 04/23/2016   Procedure: Coronary Stent Intervention;  Surgeon: Leonie Man, MD;  Location: [place] [facility] LAB;  Service: Cardiovascular;  Laterality: N/A;  . CATARACT EXTRACTION W/PHACO Right 11/13/2012   Procedure: CATARACT EXTRACTION PHACO AND INTRAOCULAR LENS PLACEMENT (IOC);  Surgeon: Tonny Branch, MD;  Location: [facility];  Service: Ophthalmology;  Laterality: Right;  CDE:12.75  . CATARACT EXTRACTION W/PHACO Left 01/11/2013   Procedure: CATARACT EXTRACTION PHACO AND INTRAOCULAR LENS PLACEMENT (IOC);  Surgeon: Tonny Branch, MD;  Location: [facility];  Service: Ophthalmology;  Laterality: Left;  CDE: 21.13  . COLONOSCOPY  09/2013   Dr.  Burke Keels: colitis descending colon and sigmoid colon.Bx ?Cdiff vs ischemic.  Marland Kitchen CORONARY ANGIOPLASTY WITH STENT PLACEMENT  07/01/2017  . CORONARY ARTERY BYPASS GRAFT  11/09/1999   LIMA to LAD, SVG to diagonal, SVG to OM1 and OM2, SVG to PDA  . CORONARY STENT INTERVENTION N/A 07/01/2017   Procedure: CORONARY STENT INTERVENTION;  Surgeon: Burnell Blanks, MD;  Location: [place] [facility];  Service: Cardiovascular;  Laterality: N/A;  . ENTEROSCOPY N/A 12/26/2015   Procedure: ENTEROSCOPY;  Surgeon: Daneil Dolin, MD;  [facility];  Service: Endoscopy;  Laterality: N/A;  . ESOPHAGOGASTRODUODENOSCOPY N/A 05/20/2015   Procedure: ESOPHAGOGASTRODUODENOSCOPY (EGD);  Surgeon: Wilford Corner, MD;  Location: [facility] ENDOSCOPY;  Service: Endoscopy;  Laterality: N/A;  . ESOPHAGOGASTRODUODENOSCOPY N/A 12/26/2015   Procedure: ESOPHAGOGASTRODUODENOSCOPY (EGD);  Surgeon: Daneil Dolin, MD;  [facility];  Service: Endoscopy;  Laterality: N/A;  . GIVENS CAPSULE STUDY N/A 12/24/2015   Procedure: GIVENS CAPSULE STUDY;  Surgeon: Rogene Houston, MD;  [facility];  Service: Endoscopy;  Laterality: N/A;  . LEFT HEART CATH AND CORS/GRAFTS ANGIOGRAPHY N/A 07/01/2017   Procedure: LEFT HEART CATH AND CORS/GRAFTS ANGIOGRAPHY;  Surgeon: Burnell Blanks, MD;  Location: [place] [facility];  Service: Cardiovascular;  Laterality: N/A;  . LEFT HEART CATHETERIZATION WITH CORONARY ANGIOGRAM N/A 04/22/2014   Procedure: LEFT HEART CATHETERIZATION WITH CORONARY ANGIOGRAM;  Surgeon: Burnell Blanks, MD;  Location: [facility] CATH LAB;  Service: Cardiovascular;  Laterality: N/A;  . PERCUTANEOUS CORONARY STENT INTERVENTION (PCI-S) N/A 04/25/2014   Procedure: PERCUTANEOUS CORONARY STENT INTERVENTION (PCI-S);  Surgeon: Peter M Martinique, MD;  Location: [facility] CATH LAB;  Service: Cardiovascular;  Laterality: N/A;  . PERIPHERAL VASCULAR CATHETERIZATION  04/23/2016   Procedure: Thrombectomy;  Surgeon:  Leonie Man, MD;  Location: [place] CV LAB;  Service: Cardiovascular;;    Current Medications: Outpatient Medications Prior to Visit  Medication Sig Dispense Refill  . aspirin EC 81 MG EC tablet Take 1 tablet (81 mg total) by mouth daily.    Marland Kitchen atorvastatin (LIPITOR) 10 MG tablet TAKE 1 TABLET BY MOUTH  DAILY 90 tablet 3  . clopidogrel (PLAVIX) 75 MG tablet TAKE 1 TABLET BY MOUTH  DAILY 90 tablet 3  . COLCRYS 0.6 MG tablet Take 1 tablet by mouth Once daily as needed (for gout).     . cyclobenzaprine (FLEXERIL) 10 MG tablet Take 1 tablet (10 mg total) by mouth 3 times/day as needed-between meals & bedtime for muscle spasms. 30 tablet 0  . famotidine (PEPCID) 40 MG tablet TAKE 1 TABLET BY MOUTH AT  BEDTIME 90 tablet 1  . fluticasone (FLONASE) 50 MCG/ACT nasal spray Place 1 spray into both nostrils as needed for allergies or rhinitis.    Marland Kitchen isosorbide mononitrate (IMDUR) 60 MG 24 hr tablet TAKE ONE-HALF TABLET BY  MOUTH DAILY 45 tablet 6  . Magnesium 250 MG TABS Take 1 tablet by mouth daily.    Marland Kitchen  metoprolol tartrate (LOPRESSOR) 25 MG tablet Take 0.5 tablets (12.5 mg total) by mouth 2 (two) times daily. 30 tablet 1  . nitroGLYCERIN (NITROSTAT) 0.4 MG SL tablet Place 1 tablet (0.4 mg total) under the tongue every 5 (five) minutes x 3 doses as needed. If no relief after 3rd dose, proceed to the ED for an evalutation 75 tablet 1  . tamsulosin (FLOMAX) 0.4 MG CAPS capsule Take 0.4 mg by mouth at bedtime.    . vitamin B-12 (CYANOCOBALAMIN) 500 MCG tablet Take 500 mcg by mouth daily.    Marland Kitchen losartan (COZAAR) 25 MG tablet Take 0.5 tablets (12.5 mg total) by mouth daily. 30 tablet 5  . ferrous sulfate 325 (65 FE) MG tablet Take 325 mg by mouth every other day.     No facility-administered medications prior to visit.      Allergies:   Protonix [pantoprazole sodium]   Social History   Socioeconomic History  . Marital status: Married    Spouse name: None  . Number of children: None  . Years of  education: None  . Highest education level: None  Social Needs  . Financial resource strain: None  . Food insecurity - worry: None  . Food insecurity - inability: None  . Transportation needs - medical: None  . Transportation needs - non-medical: None  Occupational History  . Occupation: DISABLED    Comment: [employer]  Tobacco Use  . Smoking status: Former Smoker    Packs/day: 0.50    Years: 50.00    Pack years: 25.00    Types: Cigarettes    Start date: 01/08/1947    Last attempt to quit: 06/08/1999    Years since quitting: 18.1  . Smokeless tobacco: Never Used  Substance and Sexual Activity  . Alcohol use: No    Alcohol/week: 0.0 oz    Comment: 07/01/2017 "used to drink beer; quit in 2001"  . Drug use: No  . Sexual activity: None  Other Topics Concern  . None  Social History Narrative   He lives in [place] with his wife.     Family History:  The patient's family history includes Cancer in his father.   Review of Systems:   Please see the history of present illness.     General:  No chills, fever, night sweats or weight changes.  Cardiovascular:  No dyspnea on exertion, edema, orthopnea, palpitations, paroxysmal nocturnal dyspnea.Positive for chest pain (now resolved).  Dermatological: No rash, lesions/masses Respiratory: No cough, dyspnea Urologic: No hematuria, dysuria Abdominal:   No nausea, vomiting, diarrhea, bright red blood per rectum, melena, or hematemesis Neurologic:  No visual changes, wkns, changes in mental status. All other systems reviewed and are otherwise negative except as noted above.   Physical Exam:    VS:  BP (!) 142/68   Pulse 62   Ht 5\' 7"  (1.702 m)   Wt 142 lb (64.4 kg)   SpO2 96%   BMI 22.24 kg/m    General: Well developed, well nourished Caucasian male appearing in no acute distress. Head: Normocephalic, atraumatic, sclera non-icteric, no xanthomas, nares are without discharge.  Neck: No carotid bruits. JVD not  elevated.  Lungs: Respirations regular and unlabored, without wheezes or rales.  Heart: Regular rate and rhythm. No S3 or S4.  No murmur, no rubs, or gallops appreciated. Abdomen: Soft, non-tender, non-distended with normoactive bowel sounds. No hepatomegaly. No rebound/guarding. No obvious abdominal masses. Msk:  Strength and tone appear normal for age. No joint deformities  or effusions. Extremities: No clubbing or cyanosis. No edema.  Distal pedal pulses are 2+ bilaterally. Right groin cath site appears stable with no ecchymosis or evidence of a hematoma. Palpable lymph node noted.  Neuro: Alert and oriented X 3. Moves all extremities spontaneously. No focal deficits noted. Psych:  Responds to questions appropriately with a normal affect. Skin: No rashes or lesions noted  Wt Readings from Last 3 Encounters:  07/15/17 142 lb (64.4 kg)  07/02/17 138 lb 14.2 oz (63 kg)  06/29/17 140 lb 6.4 oz (63.7 kg)     Studies/Labs Reviewed:   EKG:  EKG is ordered today.  The ekg ordered today demonstrates NSR, HR 64, with inferior TWI.   Recent Labs: 07/02/2017: BUN 18; Creatinine, Ser 1.06; Hemoglobin 12.4; Platelets 213; Potassium 3.8; Sodium 139   Lipid Panel    Component Value Date/Time   CHOL 121 04/23/2016 0346   TRIG 117 04/23/2016 0346   HDL 22 (L) 04/23/2016 0346   CHOLHDL 5.5 04/23/2016 0346   VLDL 23 04/23/2016 0346   LDLCALC 76 04/23/2016 0346    Additional studies/ records that were reviewed today include:   Cardiac Catheterization: 07/01/2017  Ost LM to Mid LM lesion is 50% stenosed.  Ost LAD to Prox LAD lesion is 90% stenosed.  LIMA graft was visualized by angiography and is normal in caliber.  Ost Cx to Prox Cx lesion is 100% stenosed.  SVG graft was visualized by angiography and is normal in caliber.  Mid RCA lesion is 100% stenosed.  SVG graft was visualized by angiography and is normal in caliber.  Dist Graft to Insertion lesion is 99% stenosed.  A  drug-eluting stent was successfully placed using a STENT SYNERGY DES 3X16.  Post intervention, there is a 0% residual stenosis.  A drug-eluting stent was successfully placed.  Mid Graft lesion is 70% stenosed.  Post intervention, there is a 0% residual stenosis.  Prox Graft to Dist Graft lesion before 1st Mrg is 30% stenosed.  Mid Graft lesion between 1st Mrg and 2nd Mrg is 40% stenosed.  Mid Graft lesion is 40% stenosed.  Origin lesion is 30% stenosed.  There is mild to moderate left ventricular systolic dysfunction.  LV end diastolic pressure is normal.  The left ventricular ejection fraction is 35-45% by visual estimate.   1. Severe triple vessel CAD s/p 5 V CABG with 5/5 patent bypass grafts 2. The LAD has a high grade proximal stenosis, unchanged. The LIMA graft is patent to the mid LAD. The vein graft is patent to the Diagonal.  3. The Circumflex is occluded proximally. The vein graft to the OM1/OM2 is patent. The stents in this vein graft have mild restenosis.  4. The RCA is occluded in the mid segment. The vein graft to the RCA has a moderately severe stenosis in the mid body of the graft and the distal stented segment of the graft has 99% stenosis.  5. Moderate LV systolic function, LVEF estimated at 40%.  6. Successful PTCA/DES x 2 mid and distal body of SVG to RCA. No distal protection used as the distal lesion as within the distal stent and there was no good landing zone.   Recommendations: Continue DAPT with ASA and Plavix, continue beta blocker and statin.   Assessment:    1. Coronary artery disease involving coronary bypass graft of native heart without angina pectoris   2. Ischemic cardiomyopathy   3. Essential hypertension   4. Mixed hyperlipidemia  Plan:   In order of problems listed above:  1. CAD - s/p CABG in 2001 with multiple interventions since including DESx2 to SVG-OM1 and staged DES to SVG-RCA in 04/2014, DES to SVG-PDA in 05/2015 and  DESx2 to SVG-OM1-OM2 in 04/2016. Recently evaluated by Dr. Domenic Polite for symptoms consistent with UA and a repeat cardiac catheterization was obtained with him undergoing PTCA/DESx2 to the mid and distal body of SVG-RCA. Full report as outlined above.  - he denies any repeat episodes of chest pain or dyspnea on exertion. Will continue on ASA, Plavix, BB, Imdur, and statin therapy.   2. Ischemic Cardiomyopathy - EF estimated at 40% at the time of his cardiac catheterization.  - he denies any recent dyspnea on exertion, orthopnea, PND, or lower extremity edema. Does not appear volume overloaded by physical examination. - continue Lopressor 12.5mg  BID (unable to titrate secondary to episodes of bradycardia during recent admission) and Losartan. Will further titrate Losartan to 25mg  daily with repeat BMET in 2 weeks. Plan for a repeat echocardiogram 3 months following revascularization.   3. HTN - BP is elevated at 142/68 during today's visit. - continue Lopressor 12.5mg  BID, Imdur 30mg  daily, and Losartan with titration of Losartan to 25mg  daily. Has repeat labs scheduled with his PCP in 2 weeks and will have a BMET obtained at that time.   4. HLD - followed by PCP. LDL at 60 in 07/2016. - remains on Atorvastatin 10mg  daily.   Medication Adjustments/Labs and Tests Ordered: Current medicines are reviewed at length with the patient today.  Concerns regarding medicines are outlined above.  Medication changes, Labs and Tests ordered today are listed in the Patient Instructions below. Patient Instructions  Medication Instructions:  Your physician has recommended you make the following change in your medication: Increase Losartan to 25 mg Daily    Labwork: NONE   Testing/Procedures: Your physician has requested that you have an echocardiogram in March. Echocardiography is a painless test that uses sound waves to create images of your heart. It provides your doctor with information about the size and  shape of your heart and how well your heart's chambers and valves are working. This procedure takes approximately one hour. There are no restrictions for this procedure.  Follow-Up: Your physician recommends that you schedule a follow-up appointment in: April with Dr. Domenic Polite.   Any Other Special Instructions Will Be Listed Below (If Applicable).  If you need a refill on your cardiac medications before your next appointment, please call your pharmacy. Thank you for choosing [facility]!     Signed, Erma Heritage, PA-C  07/15/2017 8:17 PM    [address] [address] [place], [place] [address] Phone: [phone]

## 2017-07-15 ENCOUNTER — Ambulatory Visit (INDEPENDENT_AMBULATORY_CARE_PROVIDER_SITE_OTHER): Payer: Medicare Other | Admitting: Student

## 2017-07-15 ENCOUNTER — Encounter: Payer: Self-pay | Admitting: Student

## 2017-07-15 VITALS — BP 142/68 | HR 62 | Ht 67.0 in | Wt 142.0 lb

## 2017-07-15 DIAGNOSIS — I25810 Atherosclerosis of coronary artery bypass graft(s) without angina pectoris: Secondary | ICD-10-CM | POA: Diagnosis not present

## 2017-07-15 DIAGNOSIS — I10 Essential (primary) hypertension: Secondary | ICD-10-CM

## 2017-07-15 DIAGNOSIS — E782 Mixed hyperlipidemia: Secondary | ICD-10-CM | POA: Diagnosis not present

## 2017-07-15 DIAGNOSIS — I255 Ischemic cardiomyopathy: Secondary | ICD-10-CM

## 2017-07-15 MED ORDER — LOSARTAN POTASSIUM 25 MG PO TABS: 25.0000 mg | ORAL_TABLET | Freq: Every day | ORAL | 3 refills | Status: DC

## 2017-07-15 NOTE — Patient Instructions (Signed)
Medication Instructions:  Your physician has recommended you make the following change in your medication: Increase Losartan to 25 mg Daily    Labwork: NONE   Testing/Procedures: Your physician has requested that you have an echocardiogram in March. Echocardiography is a painless test that uses sound waves to create images of your heart. It provides your doctor with information about the size and shape of your heart and how well your heart's chambers and valves are working. This procedure takes approximately one hour. There are no restrictions for this procedure.    Follow-Up: Your physician recommends that you schedule a follow-up appointment in: April with Dr. Domenic Polite.    Any Other Special Instructions Will Be Listed Below (If Applicable).     If you need a refill on your cardiac medications before your next appointment, please call your pharmacy. Thank you for choosing [facility]!

## 2017-08-03 ENCOUNTER — Other Ambulatory Visit: Payer: Self-pay | Admitting: Student

## 2017-08-03 DIAGNOSIS — I255 Ischemic cardiomyopathy: Secondary | ICD-10-CM

## 2017-08-03 DIAGNOSIS — I2510 Atherosclerotic heart disease of native coronary artery without angina pectoris: Secondary | ICD-10-CM

## 2017-08-05 ENCOUNTER — Telehealth: Payer: Self-pay

## 2017-08-05 NOTE — Telephone Encounter (Signed)
Patients wife states she was told by Dr. Domenic Polite to switch from Pepcid to Nexium. Do not see this anywhere in chart. Will send to provider for clarification

## 2017-08-05 NOTE — Telephone Encounter (Signed)
This depends on whether he is having progressive GERD symptoms. If so, can switch from Pepcid to Nexium.

## 2017-08-05 NOTE — Telephone Encounter (Signed)
Wife confirmed that patient is having more symptoms of acid reflux and would like to switch to nexium.

## 2017-08-08 ENCOUNTER — Ambulatory Visit: Payer: Medicare Other | Admitting: Cardiology

## 2017-08-08 MED ORDER — ESOMEPRAZOLE MAGNESIUM 20 MG PO CPDR: 20.0000 mg | DELAYED_RELEASE_CAPSULE | Freq: Every day | ORAL | 1 refills | Status: DC

## 2017-08-08 NOTE — Telephone Encounter (Signed)
Can switch from Pepcid to Nexium starting at 20 mg daily.

## 2017-08-09 ENCOUNTER — Ambulatory Visit: Payer: Medicare Other | Admitting: Cardiology

## 2017-08-14 ENCOUNTER — Emergency Department (HOSPITAL_COMMUNITY): Payer: Medicare Other

## 2017-08-14 ENCOUNTER — Other Ambulatory Visit: Payer: Self-pay

## 2017-08-14 ENCOUNTER — Emergency Department (HOSPITAL_COMMUNITY)
Admission: EM | Admit: 2017-08-14 | Discharge: 2017-08-14 | Disposition: A | Discharge status: Home / Self Care | Payer: Medicare Other | Attending: Emergency Medicine | Admitting: Emergency Medicine

## 2017-08-14 ENCOUNTER — Encounter (HOSPITAL_COMMUNITY): Payer: Self-pay | Admitting: Emergency Medicine

## 2017-08-14 DIAGNOSIS — Z87891 Personal history of nicotine dependence: Secondary | ICD-10-CM | POA: Diagnosis not present

## 2017-08-14 DIAGNOSIS — Z7982 Long term (current) use of aspirin: Secondary | ICD-10-CM | POA: Diagnosis not present

## 2017-08-14 DIAGNOSIS — Z7902 Long term (current) use of antithrombotics/antiplatelets: Secondary | ICD-10-CM | POA: Diagnosis not present

## 2017-08-14 DIAGNOSIS — I129 Hypertensive chronic kidney disease with stage 1 through stage 4 chronic kidney disease, or unspecified chronic kidney disease: Secondary | ICD-10-CM | POA: Diagnosis not present

## 2017-08-14 DIAGNOSIS — R3121 Asymptomatic microscopic hematuria: Secondary | ICD-10-CM | POA: Diagnosis not present

## 2017-08-14 DIAGNOSIS — I252 Old myocardial infarction: Secondary | ICD-10-CM | POA: Insufficient documentation

## 2017-08-14 DIAGNOSIS — I2510 Atherosclerotic heart disease of native coronary artery without angina pectoris: Secondary | ICD-10-CM | POA: Diagnosis not present

## 2017-08-14 DIAGNOSIS — Z79899 Other long term (current) drug therapy: Secondary | ICD-10-CM | POA: Insufficient documentation

## 2017-08-14 DIAGNOSIS — N400 Enlarged prostate without lower urinary tract symptoms: Secondary | ICD-10-CM | POA: Diagnosis not present

## 2017-08-14 DIAGNOSIS — N183 Chronic kidney disease, stage 3 (moderate): Secondary | ICD-10-CM | POA: Diagnosis not present

## 2017-08-14 DIAGNOSIS — R319 Hematuria, unspecified: Secondary | ICD-10-CM | POA: Diagnosis not present

## 2017-08-14 LAB — URINALYSIS, ROUTINE W REFLEX MICROSCOPIC
BACTERIA UA: NONE SEEN
Bilirubin Urine: NEGATIVE
GLUCOSE, UA: NEGATIVE mg/dL
KETONES UR: NEGATIVE mg/dL
Leukocytes, UA: NEGATIVE
Nitrite: NEGATIVE
PROTEIN: NEGATIVE mg/dL
SQUAMOUS EPITHELIAL / LPF: NONE SEEN
Specific Gravity, Urine: 1.009 (ref 1.005–1.030)
pH: 7 (ref 5.0–8.0)

## 2017-08-14 LAB — CBC WITH DIFFERENTIAL/PLATELET
BASOS ABS: 0 10*3/uL (ref 0.0–0.1)
BASOS PCT: 0 %
Eosinophils Absolute: 0.1 10*3/uL (ref 0.0–0.7)
Eosinophils Relative: 2 %
HCT: 37.5 % — ABNORMAL LOW (ref 39.0–52.0)
Hemoglobin: 12.1 g/dL — ABNORMAL LOW (ref 13.0–17.0)
LYMPHS PCT: 15 %
Lymphs Abs: 0.7 10*3/uL (ref 0.7–4.0)
MCH: 28.8 pg (ref 26.0–34.0)
MCHC: 32.3 g/dL (ref 30.0–36.0)
MCV: 89.3 fL (ref 78.0–100.0)
MONO ABS: 0.4 10*3/uL (ref 0.1–1.0)
Monocytes Relative: 9 %
NEUTROS ABS: 3.5 10*3/uL (ref 1.7–7.7)
Neutrophils Relative %: 74 %
PLATELETS: 226 10*3/uL (ref 150–400)
RBC: 4.2 MIL/uL — AB (ref 4.22–5.81)
RDW: 12.7 % (ref 11.5–15.5)
WBC: 4.7 10*3/uL (ref 4.0–10.5)

## 2017-08-14 LAB — COMPREHENSIVE METABOLIC PANEL
ALBUMIN: 3.8 g/dL (ref 3.5–5.0)
ALT: 26 U/L (ref 17–63)
AST: 21 U/L (ref 15–41)
Alkaline Phosphatase: 56 U/L (ref 38–126)
Anion gap: 8 (ref 5–15)
BUN: 22 mg/dL — AB (ref 6–20)
CHLORIDE: 106 mmol/L (ref 101–111)
CO2: 26 mmol/L (ref 22–32)
CREATININE: 1.26 mg/dL — AB (ref 0.61–1.24)
Calcium: 9.5 mg/dL (ref 8.9–10.3)
GFR calc Af Amer: >60 mL/min (ref >60)
GFR calc non Af Amer: 52 mL/min — ABNORMAL LOW (ref >60)
GLUCOSE: 93 mg/dL (ref 65–99)
POTASSIUM: 4.3 mmol/L (ref 3.5–5.1)
Sodium: 140 mmol/L (ref 135–145)
Total Bilirubin: 0.7 mg/dL (ref 0.3–1.2)
Total Protein: 6.5 g/dL (ref 6.5–8.1)

## 2017-08-14 NOTE — ED Triage Notes (Addendum)
Patient c/o blood in urine that started yesterday. Per patient x3 since yesterday. Patient states "It starts with blood and then clears and is just urine." Patient does report some clots. Patient taking Plavix. Denies any pain at this time but reports intermittent, bilateral flank pain and lower abd pain. Per patient near syncope this morning but denies any dizziness now. Marland Kitchen Hx of enlarged prostate.

## 2017-08-14 NOTE — Discharge Instructions (Signed)
Please stop your plavix for now. See Dr Louis Meckel or a member of his team at [facility] as soon as possible. Please increase water and liquids.Return to the Emergency Dept if the bleeding gets worse before your urology appointment.

## 2017-08-14 NOTE — ED Provider Notes (Signed)
[facility] EMERGENCY DEPARTMENT Provider Note   CSN: [id number] Arrival date & time: 08/14/17  5093     History   Chief Complaint Chief Complaint  Patient presents with  . Hematuria    HPI Billy [name] is a 83 y.o. male.    Patient is an 83 year old male who presents to the emergency department because of blood in the urine.  The patient states that he has been having problems with blood in the urine since yesterday, March 9.  He states that he has had at least 3 episodes of blood in the urine during urination.  He does not have any bleeding when he is not urinating.  He has not had any recent injury or trauma to the bladder area or to the genital area.  The patient states he has been told he had some enlargement of his prostate, but he is denying any pain in the rectal area or any new pain in the back.  He is not had any fever or chills to be reported, no nausea or vomiting noted.  It is of note that the patient is currently on aspirin and Plavix.  He says he has had a problem with some rectal bleeding in the past, but never had bleeding from the penis.  He presents now for assistance with this issue.             Past Medical History:  Diagnosis Date  . Arthritis    "fingers, toes" (07/01/2017)  . Carotid artery disease ([place])    26-71% LICA and 2-45% RICA - December 2013  . Coronary atherosclerosis of native coronary artery    a. /p CABG in 2001 b. DESx2 to SVG-OM1 and staged DES to SVG-RCA in 04/2014 c. DES to SVG-PDA in 05/2015 d. DESx2 to SVG-OM1-OM2 in 04/2016 e. 06/2017: DESx2 to mid and distal body of SVG-RCA  . Essential hypertension, benign   . GERD (gastroesophageal reflux disease)   . Gout   . High cholesterol   . History of blood transfusion 2017   "related to blood thinner"  . HOH (hard of hearing)   . Myocardial infarction [facility]) 2017   "small one and a big one before that" (07/01/2017)  . Sinus bradycardia    Asymptomatic  . Skin cancer    "burned  off; behind his ear"    Patient Active Problem List   Diagnosis Date Noted  . Unstable angina ([place]) 07/01/2017  . Coronary stent thrombosis   . Chest pain at rest 04/22/2016  . Syncope and collapse   . Mucosal abnormality of duodenum   . Blood loss anemia   . Coronary artery disease involving coronary bypass graft of native heart without angina pectoris   . IDA (iron deficiency anemia)   . Syncope 12/23/2015  . Symptomatic anemia 12/23/2015  . NSTEMI (non-ST elevated myocardial infarction) ([place])   . Angina at rest [facility]) 05/18/2015  . Abdominal pain, epigastric 04/14/2015  . Left sided abdominal pain 04/14/2015  . Melena 02/27/2015  . CAD (coronary artery disease) of artery bypass graft 05/17/2014  . Prediabetes 04/23/2014  . CKD (chronic kidney disease) stage 3, GFR 30-59 ml/min (HCC) 04/23/2014  . Anemia 04/23/2014  . HOH (hard of hearing)   . Bradycardia 04/19/2012  . Carotid artery disease without cerebral infarction ([place]) 04/07/2011  . Mixed hyperlipidemia 03/05/2009  . Essential hypertension 03/05/2009    Past Surgical History:  Procedure Laterality Date  . APPENDECTOMY     Ruptured  .  CARDIAC CATHETERIZATION N/A 05/19/2015   Procedure: Left Heart Cath and Cors/Grafts Angiography;  Surgeon: Lorretta Harp, MD;  Location: [place] CV LAB;  Service: Cardiovascular;  Laterality: N/A;  . CARDIAC CATHETERIZATION N/A 05/19/2015   Procedure: Coronary Stent Intervention;  Surgeon: Lorretta Harp, MD;  Location: [place] CV LAB;  Service: Cardiovascular;  Laterality: N/A;  . CARDIAC CATHETERIZATION N/A 04/23/2016   Procedure: LEFT HEART CATH AND CORS/GRAFTS ANGIOGRAPHY;  Surgeon: Leonie Man, MD;  Location: [place] CV LAB;  Service: Cardiovascular;  Laterality: N/A;  . CARDIAC CATHETERIZATION N/A 04/23/2016   Procedure: Coronary Stent Intervention;  Surgeon: Leonie Man, MD;  Location: [place] CV LAB;  Service: Cardiovascular;  Laterality: N/A;  .  CATARACT EXTRACTION W/PHACO Right 11/13/2012   Procedure: CATARACT EXTRACTION PHACO AND INTRAOCULAR LENS PLACEMENT (IOC);  Surgeon: Tonny Branch, MD;  Location: [facility] OR[facility];  Service: Ophthalmology;  Laterality: Right;  CDE:12.75  . CATARACT EXTRACTION W/PHACO Left 01/11/2013   Procedure: CATARACT EXTRACTION PHACO AND INTRAOCULAR LENS PLACEMENT (IOC);  Surgeon: Tonny Branch, MD;  Location: [facility] OR[facility];  Service: Ophthalmology;  Laterality: Left;  CDE: 21.13  . COLONOSCOPY  09/2013   Dr. Burke Keels: colitis descending colon and sigmoid colon.Bx ?Cdiff vs ischemic.  Marland Kitchen CORONARY ANGIOPLASTY WITH STENT PLACEMENT  07/01/2017  . CORONARY ARTERY BYPASS GRAFT  11/09/1999   LIMA to LAD, SVG to diagonal, SVG to OM1 and OM2, SVG to PDA  . CORONARY STENT INTERVENTION N/A 07/01/2017   Procedure: CORONARY STENT INTERVENTION;  Surgeon: Burnell Blanks, MD;  Location: [place] CV LAB;  Service: Cardiovascular;  Laterality: N/A;  . ENTEROSCOPY N/A 12/26/2015   Procedure: ENTEROSCOPY;  Surgeon: Daneil Dolin, MD;  [facility];  Service: Endoscopy;  Laterality: N/A;  . ESOPHAGOGASTRODUODENOSCOPY N/A 05/20/2015   Procedure: ESOPHAGOGASTRODUODENOSCOPY (EGD);  Surgeon: Wilford Corner, MD;  Location: [facility] ENDOSCOPY;  Service: Endoscopy;  Laterality: N/A;  . ESOPHAGOGASTRODUODENOSCOPY N/A 12/26/2015   Procedure: ESOPHAGOGASTRODUODENOSCOPY (EGD);  Surgeon: Daneil Dolin, MD;  [facility];  Service: Endoscopy;  Laterality: N/A;  . GIVENS CAPSULE STUDY N/A 12/24/2015   Procedure: GIVENS CAPSULE STUDY;  Surgeon: Rogene Houston, MD;  [facility];  Service: Endoscopy;  Laterality: N/A;  . LEFT HEART CATH AND CORS/GRAFTS ANGIOGRAPHY N/A 07/01/2017   Procedure: LEFT HEART CATH AND CORS/GRAFTS ANGIOGRAPHY;  Surgeon: Burnell Blanks, MD;  Location: [place] CV LAB;  Service: Cardiovascular;  Laterality: N/A;  . LEFT HEART CATHETERIZATION WITH CORONARY ANGIOGRAM N/A 04/22/2014   Procedure:  LEFT HEART CATHETERIZATION WITH CORONARY ANGIOGRAM;  Surgeon: Burnell Blanks, MD;  Location: [facility] CATH LAB;  Service: Cardiovascular;  Laterality: N/A;  . PERCUTANEOUS CORONARY STENT INTERVENTION (PCI-S) N/A 04/25/2014   Procedure: PERCUTANEOUS CORONARY STENT INTERVENTION (PCI-S);  Surgeon: Peter M Martinique, MD;  Location: [facility] CATH LAB;  Service: Cardiovascular;  Laterality: N/A;  . PERIPHERAL VASCULAR CATHETERIZATION  04/23/2016   Procedure: Thrombectomy;  Surgeon: Leonie Man, MD;  Location: [place] CV LAB;  Service: Cardiovascular;;       Home Medications    Prior to Admission medications   Medication Sig Start Date End Date Taking? Authorizing Provider  aspirin EC 81 MG EC tablet Take 1 tablet (81 mg total) by mouth daily. 07/02/17   Strader, Fransisco Hertz, PA-C  atorvastatin (LIPITOR) 10 MG tablet TAKE 1 TABLET BY MOUTH  DAILY 02/15/17   Satira Sark, MD  clopidogrel (PLAVIX) 75 MG tablet TAKE 1 TABLET BY  MOUTH  DAILY 02/15/17   Satira Sark, MD  COLCRYS 0.6 MG tablet Take 1 tablet by mouth Once daily as needed (for gout).  03/05/12   [provider]  cyclobenzaprine (FLEXERIL) 10 MG tablet Take 1 tablet (10 mg total) by mouth 3 times/day as needed-between meals & bedtime for muscle spasms. 04/24/16   Eber Jones, MD  esomeprazole (NEXIUM) 20 MG capsule Take 1 capsule (20 mg total) by mouth daily at 12 noon. 08/08/17   Satira Sark, MD  fluticasone (FLONASE) 50 MCG/ACT nasal spray Place 1 spray into both nostrils as needed for allergies or rhinitis.    [provider]  isosorbide mononitrate (IMDUR) 60 MG 24 hr tablet TAKE ONE-HALF TABLET BY  MOUTH DAILY 06/21/16   Satira Sark, MD  losartan (COZAAR) 25 MG tablet Take 1 tablet (25 mg total) by mouth daily. 07/15/17 10/13/17  Strader, Fransisco Hertz, PA-C  Magnesium 250 MG TABS Take 1 tablet by mouth daily.    [provider]  metoprolol tartrate (LOPRESSOR) 25 MG tablet Take 0.5  tablets (12.5 mg total) by mouth 2 (two) times daily. 06/29/17   Satira Sark, MD  nitroGLYCERIN (NITROSTAT) 0.4 MG SL tablet Place 1 tablet (0.4 mg total) under the tongue every 5 (five) minutes x 3 doses as needed. If no relief after 3rd dose, proceed to the ED for an evalutation 06/10/15   Satira Sark, MD  tamsulosin (FLOMAX) 0.4 MG CAPS capsule Take 0.4 mg by mouth at bedtime.    [provider]  vitamin B-12 (CYANOCOBALAMIN) 500 MCG tablet Take 500 mcg by mouth daily.    [provider]    Family History Family History  Problem Relation Age of Onset  . Cancer Father        colon, age 40s    Social History Social History   Tobacco Use  . Smoking status: Former Smoker    Packs/day: 0.50    Years: 50.00    Pack years: 25.00    Types: Cigarettes    Start date: 01/08/1947    Last attempt to quit: 06/08/1999    Years since quitting: 18.1  . Smokeless tobacco: Never Used  Substance Use Topics  . Alcohol use: No    Alcohol/week: 0.0 oz    Comment: 07/01/2017 "used to drink beer; quit in 2001"  . Drug use: No     Allergies   Protonix [pantoprazole sodium]   Review of Systems Review of Systems  Constitutional: Negative for activity change.       All ROS Neg except as noted in HPI  HENT: Negative for nosebleeds.   Eyes: Negative for photophobia and discharge.  Respiratory: Negative for cough, shortness of breath and wheezing.   Cardiovascular: Negative for chest pain and palpitations.  Gastrointestinal: Negative for abdominal pain and blood in stool.  Genitourinary: Positive for hematuria. Negative for dysuria and frequency.  Musculoskeletal: Negative for arthralgias, back pain and neck pain.  Skin: Negative.   Neurological: Negative for dizziness, seizures and speech difficulty.  Psychiatric/Behavioral: Negative for confusion and hallucinations.     Physical Exam Updated Vital Signs There were no vitals taken for this visit.  Physical Exam    Constitutional: He is oriented to person, place, and time. He appears well-developed and well-nourished.  Non-toxic appearance.  HENT:  Head: Normocephalic.  Right Ear: Tympanic membrane and external ear normal.  Left Ear: Tympanic membrane and external ear normal.  Eyes: EOM and lids are  normal. Pupils are equal, round, and reactive to light.  Neck: Normal range of motion. Neck supple. Carotid bruit is not present.  Cardiovascular: Normal rate, regular rhythm, normal heart sounds, intact distal pulses and normal pulses.  Pulmonary/Chest: Breath sounds normal. No respiratory distress.  Abdominal: Soft. Bowel sounds are normal. There is no tenderness. There is no guarding.  Genitourinary:  Genitourinary Comments: No external lesions noted.  No mass appreciated in the rectal vault no foreign body appreciated.  The prostate is enlarged, but nontender.  There is no blood on my gloved finger.  Musculoskeletal: Normal range of motion.  Lymphadenopathy:       Head (right side): No submandibular adenopathy present.       Head (left side): No submandibular adenopathy present.    He has no cervical adenopathy.  Neurological: He is alert and oriented to person, place, and time. He has normal strength. No cranial nerve deficit or sensory deficit.  Skin: Skin is warm and dry.  Psychiatric: He has a normal mood and affect. His speech is normal.  Nursing note and vitals reviewed.    ED Treatments / Results  Labs (all labs ordered are listed, but only abnormal results are displayed) Labs Reviewed - No data to display  EKG  EKG Interpretation None       Radiology No results found.  Procedures Procedures (including critical care time)  Medications Ordered in ED Medications - No data to display   Initial Impression / Assessment and Plan / ED Course  I have reviewed the triage vital signs and the nursing notes.  Pertinent labs & imaging results that were available during my care of the  patient were reviewed by me and considered in my medical decision making (see chart for details).     Case discussed with Dr. Thurnell Garbe. Final Clinical Impressions(s) / ED Diagnoses MDM Blood pressure is elevated at 169/84.  I have asked the patient to have this rechecked by his primary physician.  Vital signs otherwise within normal limits.   Pulse ox 96% on room air. WNL by my interpretation.  Hemoglobin is slightly low at 12.1, hematocrit is slightly low at 37.5, platelets are within normal limits.  Complete blood count is otherwise within normal limits.  Case discussed with urology.  The suggestion that the patient stop the Plavix for now.  An appointment will be arranged with [facility] here in the [place] area.  CT scan of the abdomen shows no renal or ureteral stones, and no hydronephrosis.  There is prostate enlargement.  There is a chronic infrarenal aortic dissection as seen on a prior CT scan.  Patient is in agreement with this plan.   Final diagnoses:  None    ED Discharge Orders    None       Lily Kocher, PA-C 08/14/17 [place], [place], DO 08/15/17 2216

## 2017-08-15 ENCOUNTER — Telehealth: Payer: Self-pay | Admitting: Cardiology

## 2017-08-15 NOTE — Telephone Encounter (Signed)
LMTCB

## 2017-08-15 NOTE — Telephone Encounter (Signed)
Patient was seen in the ER [facility]) 08-14-17 for blood in urine.  Nees to see Urologist asap. Patient needs to know how long he needs to be off of Plavix.

## 2017-08-16 DIAGNOSIS — N401 Enlarged prostate with lower urinary tract symptoms: Secondary | ICD-10-CM | POA: Diagnosis not present

## 2017-08-16 DIAGNOSIS — R310 Gross hematuria: Secondary | ICD-10-CM | POA: Diagnosis not present

## 2017-08-16 DIAGNOSIS — R3912 Poor urinary stream: Secondary | ICD-10-CM | POA: Diagnosis not present

## 2017-08-16 LAB — URINE CULTURE: CULTURE: NO GROWTH

## 2017-08-16 NOTE — Telephone Encounter (Signed)
Patient seeing urologist today. Wife will call back with report from urologist

## 2017-08-31 ENCOUNTER — Other Ambulatory Visit: Payer: Self-pay

## 2017-08-31 ENCOUNTER — Ambulatory Visit (INDEPENDENT_AMBULATORY_CARE_PROVIDER_SITE_OTHER): Payer: Medicare Other

## 2017-08-31 DIAGNOSIS — I2510 Atherosclerotic heart disease of native coronary artery without angina pectoris: Secondary | ICD-10-CM | POA: Diagnosis not present

## 2017-08-31 DIAGNOSIS — I255 Ischemic cardiomyopathy: Secondary | ICD-10-CM

## 2017-09-08 ENCOUNTER — Other Ambulatory Visit: Payer: Self-pay

## 2017-09-08 ENCOUNTER — Other Ambulatory Visit: Payer: Self-pay | Admitting: Cardiology

## 2017-09-08 MED ORDER — ISOSORBIDE MONONITRATE ER 60 MG PO TB24: 30.0000 mg | ORAL_TABLET | Freq: Every day | ORAL | 3 refills | Status: DC

## 2017-09-08 NOTE — Telephone Encounter (Signed)
Refilled to [facility] Imdur 30 mg daily #45

## 2017-09-08 NOTE — Telephone Encounter (Signed)
° °  1. Which medications need to be refilled? (please list name of each medication and dose if known)    isosorbide mononitrate (IMDUR) 60 MG 24 hr tablet [[id number]]    2. Which pharmacy/location (including street and city if local pharmacy) is medication to be sent to?[facility]   3. Do they need a 30 day or 90 day supply?   Please call Garnell Phenix in reference to patient's medication list.    [phone]

## 2017-09-15 NOTE — Progress Notes (Signed)
Cardiology Office Note    Date:  09/16/2017   ID:  Billy [name], DOB 25-Aug-1934, MRN [id number]  PCP:  Curlene Labrum, MD  Cardiologist: Rozann Lesches, MD    Chief Complaint  Patient presents with  . Follow-up    2 month visit    History of Present Illness:    ARBOR [name] is a 83 y.o. male with past medical history of CAD (s/p CABG in 2001, DESx2 to SVG-OM1 and staged DES to SVG-RCA in 04/2014, DES to SVG-PDA in 05/2015 and DESx2 to SVG-OM1-OM2 in 04/2016, cath in 06/2017 showing 99% stenosis of SVG-RCA with DESx2), ischemic cardiomyopathy (EF 40-45%), HTN, HLD, and GERD who presents to the office today for 36-month follow-up.   He was last examined by myself in 07/2017 following his recent hospitalization for unstable angina at which time he required stent placement as outlined above.  He was overall doing well at that time and denied any recurrent chest pain or dyspnea on exertion. He was continued on ASA, Plavix, beta-blocker, Imdur, and statin therapy with losartan being further titrated to 25 mg daily in the setting of his cardiomyopathy. A repeat echocardiogram was obtained on 08/31/2017 and showed his EF have mildly improved since his recent MI and was at 40-45%.  In talking with the patient today, he reports overall doing well from a cardiac perspective since his last office visit. Says he was very active and performing yard work yesterday and "felt the best he has in months". He denies any recent chest pain, dyspnea on exertion, orthopnea, PND, or lower extremity edema. Reports good compliance with his medication regimen including ASA and Plavix. Denies missing any recent doses and no evidence of active bleeding.   He has been experiencing paresthesia type sensations along his posterior neck and radiating along his shoulders bilaterally. This is worse when turning from side to side and there is no association with exertion.  He does have a history of arthritis and prior  cervical issues.   Past Medical History:  Diagnosis Date  . Arthritis    "fingers, toes" (07/01/2017)  . Carotid artery disease ([place])    01-02% LICA and 7-25% RICA - December 2013  . Coronary atherosclerosis of native coronary artery    a. /p CABG in 2001 b. DESx2 to SVG-OM1 and staged DES to SVG-RCA in 04/2014 c. DES to SVG-PDA in 05/2015 d. DESx2 to SVG-OM1-OM2 in 04/2016 e. 06/2017: DESx2 to mid and distal body of SVG-RCA  . Essential hypertension, benign   . GERD (gastroesophageal reflux disease)   . Gout   . High cholesterol   . History of blood transfusion 2017   "related to blood thinner"  . HOH (hard of hearing)   . Myocardial infarction [facility]) 2017   "small one and a big one before that" (07/01/2017)  . Sinus bradycardia    Asymptomatic  . Skin cancer    "burned off; behind his ear"    Past Surgical History:  Procedure Laterality Date  . APPENDECTOMY     Ruptured  . CARDIAC CATHETERIZATION N/A 05/19/2015   Procedure: Left Heart Cath and Cors/Grafts Angiography;  Surgeon: Lorretta Harp, MD;  Location: [place] [facility];  Service: Cardiovascular;  Laterality: N/A;  . CARDIAC CATHETERIZATION N/A 05/19/2015   Procedure: Coronary Stent Intervention;  Surgeon: Lorretta Harp, MD;  Location: [place] [facility];  Service: Cardiovascular;  Laterality: N/A;  . CARDIAC CATHETERIZATION N/A 04/23/2016   Procedure: LEFT HEART CATH  AND CORS/GRAFTS ANGIOGRAPHY;  Surgeon: Leonie Man, MD;  Location: [place] [facility];  Service: Cardiovascular;  Laterality: N/A;  . CARDIAC CATHETERIZATION N/A 04/23/2016   Procedure: Coronary Stent Intervention;  Surgeon: Leonie Man, MD;  Location: [place] [facility];  Service: Cardiovascular;  Laterality: N/A;  . CATARACT EXTRACTION W/PHACO Right 11/13/2012   Procedure: CATARACT EXTRACTION PHACO AND INTRAOCULAR LENS PLACEMENT (IOC);  Surgeon: Tonny Branch, MD;  Location: [facility];  Service: Ophthalmology;  Laterality: Right;  CDE:12.75  .  CATARACT EXTRACTION W/PHACO Left 01/11/2013   Procedure: CATARACT EXTRACTION PHACO AND INTRAOCULAR LENS PLACEMENT (IOC);  Surgeon: Tonny Branch, MD;  Location: [facility];  Service: Ophthalmology;  Laterality: Left;  CDE: 21.13  . COLONOSCOPY  09/2013   Dr. Burke Keels: colitis descending colon and sigmoid colon.Bx ?Cdiff vs ischemic.  Marland Kitchen CORONARY ANGIOPLASTY WITH STENT PLACEMENT  07/01/2017  . CORONARY ARTERY BYPASS GRAFT  11/09/1999   LIMA to LAD, SVG to diagonal, SVG to OM1 and OM2, SVG to PDA  . CORONARY STENT INTERVENTION N/A 07/01/2017   Procedure: CORONARY STENT INTERVENTION;  Surgeon: Burnell Blanks, MD;  Location: [place] [facility];  Service: Cardiovascular;  Laterality: N/A;  . ENTEROSCOPY N/A 12/26/2015   Procedure: ENTEROSCOPY;  Surgeon: Daneil Dolin, MD;  [facility];  Service: Endoscopy;  Laterality: N/A;  . ESOPHAGOGASTRODUODENOSCOPY N/A 05/20/2015   Procedure: ESOPHAGOGASTRODUODENOSCOPY (EGD);  Surgeon: Wilford Corner, MD;  Location: [facility] ENDOS[facility];  Service: Endoscopy;  Laterality: N/A;  . ESOPHAGOGASTRODUODENOSCOPY N/A 12/26/2015   Procedure: ESOPHAGOGASTRODUODENOSCOPY (EGD);  Surgeon: Daneil Dolin, MD;  [facility];  Service: Endoscopy;  Laterality: N/A;  . GIVENS CAPSULE STUDY N/A 12/24/2015   Procedure: GIVENS CAPSULE STUDY;  Surgeon: Rogene Houston, MD;  [facility];  Service: Endoscopy;  Laterality: N/A;  . LEFT HEART CATH AND CORS/GRAFTS ANGIOGRAPHY N/A 07/01/2017   Procedure: LEFT HEART CATH AND CORS/GRAFTS ANGIOGRAPHY;  Surgeon: Burnell Blanks, MD;  Location: [place] [facility];  Service: Cardiovascular;  Laterality: N/A;  . LEFT HEART CATHETERIZATION WITH CORONARY ANGIOGRAM N/A 04/22/2014   Procedure: LEFT HEART CATHETERIZATION WITH CORONARY ANGIOGRAM;  Surgeon: Burnell Blanks, MD;  Location: [facility] CATH LAB;  Service: Cardiovascular;  Laterality: N/A;  . PERCUTANEOUS CORONARY STENT INTERVENTION (PCI-S) N/A 04/25/2014    Procedure: PERCUTANEOUS CORONARY STENT INTERVENTION (PCI-S);  Surgeon: Peter M Martinique, MD;  Location: [facility] CATH LAB;  Service: Cardiovascular;  Laterality: N/A;  . PERIPHERAL VASCULAR CATHETERIZATION  04/23/2016   Procedure: Thrombectomy;  Surgeon: Leonie Man, MD;  Location: [place] [facility];  Service: Cardiovascular;;    Current Medications: Outpatient Medications Prior to Visit  Medication Sig Dispense Refill  . aspirin 81 MG chewable tablet Chew 81 mg by mouth daily.    Marland Kitchen atorvastatin (LIPITOR) 10 MG tablet TAKE 1 TABLET BY MOUTH  DAILY 90 tablet 3  . clopidogrel (PLAVIX) 75 MG tablet TAKE 1 TABLET BY MOUTH  DAILY 90 tablet 3  . COLCRYS 0.6 MG tablet Take 1 tablet by mouth Once daily as needed (for gout).     . cyclobenzaprine (FLEXERIL) 10 MG tablet Take 1 tablet (10 mg total) by mouth 3 times/day as needed-between meals & bedtime for muscle spasms. (Patient taking differently: Take 10 mg by mouth every other day. ) 30 tablet 0  . esomeprazole (NEXIUM) 20 MG capsule Take 1 capsule (20 mg total) by mouth daily at 12 noon. 90 capsule 1  . finasteride (PROSCAR) 5 MG  tablet Take 5 mg by mouth daily.    . fluticasone (FLONASE) 50 MCG/ACT nasal spray Place 1 spray into both nostrils as needed for allergies or rhinitis.    Marland Kitchen isosorbide mononitrate (IMDUR) 60 MG 24 hr tablet Take 0.5 tablets (30 mg total) by mouth daily. 45 tablet 3  . losartan (COZAAR) 25 MG tablet Take 1 tablet (25 mg total) by mouth daily. 90 tablet 3  . Magnesium 250 MG TABS Take 1 tablet by mouth daily.    . nitroGLYCERIN (NITROSTAT) 0.4 MG SL tablet Place 1 tablet (0.4 mg total) under the tongue every 5 (five) minutes x 3 doses as needed. If no relief after 3rd dose, proceed to the ED for an evalutation 75 tablet 1  . tamsulosin (FLOMAX) 0.4 MG CAPS capsule Take 0.4 mg by mouth at bedtime.    . metoprolol tartrate (LOPRESSOR) 25 MG tablet TAKE ONE-HALF TABLET BY  MOUTH TWO TIMES DAILY 30 tablet 0  . famotidine (PEPCID)  20 MG tablet Take 20 mg by mouth daily.     No facility-administered medications prior to visit.      Allergies:   Protonix [pantoprazole sodium]   Social History   Socioeconomic History  . Marital status: Married    Spouse name: Not on file  . Number of children: Not on file  . Years of education: Not on file  . Highest education level: Not on file  Occupational History  . Occupation: DISABLED    Comment: [employer]  Social Needs  . Financial resource strain: Not on file  . Food insecurity:    Worry: Not on file    Inability: Not on file  . Transportation needs:    Medical: Not on file    Non-medical: Not on file  Tobacco Use  . Smoking status: Former Smoker    Packs/day: 0.50    Years: 50.00    Pack years: 25.00    Types: Cigarettes    Start date: 01/08/1947    Last attempt to quit: 06/08/1999    Years since quitting: 18.2  . Smokeless tobacco: Never Used  Substance and Sexual Activity  . Alcohol use: No    Alcohol/week: 0.0 oz    Comment: 07/01/2017 "used to drink beer; quit in 2001"  . Drug use: No  . Sexual activity: Not on file  Lifestyle  . Physical activity:    Days per week: Not on file    Minutes per session: Not on file  . Stress: Not on file  Relationships  . Social connections:    Talks on phone: Not on file    Gets together: Not on file    Attends religious service: Not on file    Active member of club or organization: Not on file    Attends meetings of clubs or organizations: Not on file    Relationship status: Not on file  Other Topics Concern  . Not on file  Social History Narrative   He lives in [place] with his wife.     Family History:  The patient's family history includes Cancer in his father.   Review of Systems:   Please see the history of present illness.     General:  No chills, fever, night sweats or weight changes. Positive for neck pain.  Cardiovascular:  No chest pain, dyspnea on exertion, edema, orthopnea,  palpitations, paroxysmal nocturnal dyspnea. Dermatological: No rash, lesions/masses Respiratory: No cough, dyspnea Urologic: No hematuria, dysuria Abdominal:   No  nausea, vomiting, diarrhea, bright red blood per rectum, melena, or hematemesis Neurologic:  No visual changes, wkns, changes in mental status.  All other systems reviewed and are otherwise negative except as noted above.   Physical Exam:    VS:  BP 134/64 (BP Location: Right Arm)   Pulse 61   Ht 5' 7.5" (1.715 m)   Wt 141 lb (64 kg)   SpO2 99%   BMI 21.76 kg/m    General: Well developed, well nourished Caucasian male appearing in no acute distress. Head: Normocephalic, atraumatic, sclera non-icteric, no xanthomas, nares are without discharge.  Neck: No carotid bruits. JVD not elevated.  Lungs: Respirations regular and unlabored, without wheezes or rales.  Heart: Regular rate and rhythm. No S3 or S4.  No murmur, no rubs, or gallops appreciated. Abdomen: Soft, non-tender, non-distended with normoactive bowel sounds. No hepatomegaly. No rebound/guarding. No obvious abdominal masses. Msk:  Strength and tone appear normal for age. No joint deformities or effusions. Extremities: No clubbing or cyanosis. No lower extremity edema.  Distal pedal pulses are 2+ bilaterally. Neuro: Alert and oriented X 3. Moves all extremities spontaneously. No focal deficits noted. Psych:  Responds to questions appropriately with a normal affect. Skin: No rashes or lesions noted  Wt Readings from Last 3 Encounters:  09/16/17 141 lb (64 kg)  08/14/17 140 lb (63.5 kg)  07/15/17 142 lb (64.4 kg)     Studies/Labs Reviewed:   EKG:  EKG is not ordered today.  Recent Labs: 08/14/2017: ALT 26; BUN 22; Creatinine, Ser 1.26; Hemoglobin 12.1; Platelets 226; Potassium 4.3; Sodium 140   Lipid Panel    Component Value Date/Time   CHOL 121 04/23/2016 0346   TRIG 117 04/23/2016 0346   HDL 22 (L) 04/23/2016 0346   CHOLHDL 5.5 04/23/2016 0346   VLDL  23 04/23/2016 0346   LDLCALC 76 04/23/2016 0346    Additional studies/ records that were reviewed today include:   Cardiac Catheterization: 07/01/2017  Ost LM to Mid LM lesion is 50% stenosed.  Ost LAD to Prox LAD lesion is 90% stenosed.  LIMA graft was visualized by angiography and is normal in caliber.  Ost Cx to Prox Cx lesion is 100% stenosed.  SVG graft was visualized by angiography and is normal in caliber.  Mid RCA lesion is 100% stenosed.  SVG graft was visualized by angiography and is normal in caliber.  Dist Graft to Insertion lesion is 99% stenosed.  A drug-eluting stent was successfully placed using a STENT SYNERGY DES 3X16.  Post intervention, there is a 0% residual stenosis.  A drug-eluting stent was successfully placed.  Mid Graft lesion is 70% stenosed.  Post intervention, there is a 0% residual stenosis.  Prox Graft to Dist Graft lesion before 1st Mrg is 30% stenosed.  Mid Graft lesion between 1st Mrg and 2nd Mrg is 40% stenosed.  Mid Graft lesion is 40% stenosed.  Origin lesion is 30% stenosed.  There is mild to moderate left ventricular systolic dysfunction.  LV end diastolic pressure is normal.  The left ventricular ejection fraction is 35-45% by visual estimate.   1. Severe triple vessel CAD s/p 5 V CABG with 5/5 patent bypass grafts 2. The LAD has a high grade proximal stenosis, unchanged. The LIMA graft is patent to the mid LAD. The vein graft is patent to the Diagonal.  3. The Circumflex is occluded proximally. The vein graft to the OM1/OM2 is patent. The stents in this vein graft have mild restenosis.  4.  The RCA is occluded in the mid segment. The vein graft to the RCA has a moderately severe stenosis in the mid body of the graft and the distal stented segment of the graft has 99% stenosis.  5. Moderate LV systolic function, LVEF estimated at 40%.  6. Successful PTCA/DES x 2 mid and distal body of SVG to RCA. No distal protection used as  the distal lesion as within the distal stent and there was no good landing zone.   Recommendations: Continue DAPT with ASA and Plavix, continue beta blocker and statin.    Echocardiogram: 08/31/2017 Study Conclusions  - Left ventricle: The cavity size was normal. Wall thickness was   normal. Systolic function was mildly to moderately reduced. The   estimated ejection fraction was in the range of 40% to 45%. There   is akinesis of the inferolateral and inferior myocardium. Left   ventricular diastolic function parameters were normal for the   patient&'s age. - Aortic valve: Mildly to moderately calcified annulus. Trileaflet;   mildly calcified leaflets. There was trivial regurgitation. - Mitral valve: Mildly calcified annulus. Mildly thickened leaflets   . There was mild regurgitation. - Left atrium: The atrium was mildly dilated. - Right atrium: Central venous pressure (est): 3 mm Hg. - Atrial septum: No defect or patent foramen ovale was identified. - Tricuspid valve: There was trivial regurgitation. - Pulmonary arteries: PA peak pressure: 9 mm Hg (S). - Pericardium, extracardiac: There was no pericardial effusion.   Assessment:    1. Coronary artery disease involving coronary bypass graft of native heart without angina pectoris   2. Ischemic cardiomyopathy   3. Essential hypertension   4. Mixed hyperlipidemia      Plan:   In order of problems listed above:  1. CAD - s/p CABG in 2001, DESx2 to SVG-OM1 and staged DES to SVG-RCA in 04/2014, DES to SVG-PDA in 05/2015 and DESx2 to SVG-OM1-OM2 in 04/2016. Most recent catheterization in 06/2017 showed 99% stenosis of SVG-RCA ands this was treated with DESx2. - he denies any recent chest pain or dyspnea on exertion. Does describes paraesthesias along his neck which radiates to his shoulders bilaterally and this is worse with turning from side to side. Does not resemble prior anginal symptoms and seems most consistent with  cervical radiculopathy. Informed to follow-up with PCP if symptoms persist but he denies any recent symptoms over the past few weeks.  - continue ASA, Plavix, BB, Imdur, and statin therapy.    2. Ischemic cardiomyopathy - EF 40-45% by most recent echocardiogram. He denies any recent dyspnea on exertion, orthopnea, PND, or lower extremity edema and appears euvolemic by examination today. - per current guidelines, will switch from Metoprolol Tartrate to Toprol-XL 25mg  daily in the setting of his cardiomyopathy. Further titrate at his next office visit if HR and BP allow. Continue Losartan 25mg  daily.   3. HTN - BP is well controlled at 134/64 during today's visit. - Continue Imdur 30 mg daily, Losartan 25 mg daily and Toprol-XL 25mg  daily.   4. HLD - Followed by PCP. Intolerant to high-intensity statins previously and remains on Atorvastatin 10 mg daily.  Goal LDL is less than 70 in the setting of known CAD.   Medication Adjustments/Labs and Tests Ordered: Current medicines are reviewed at length with the patient today.  Concerns regarding medicines are outlined above.  Medication changes, Labs and Tests ordered today are listed in the Patient Instructions below. Patient Instructions  Medication Instructions:  STOP LOPRESSOR  START TORPOL XL 25 MG- ONE TIME DAILY   Labwork: NONE  Testing/Procedures: NONE  Follow-Up: Your physician recommends that you schedule a follow-up appointment in: 3-4 MONTHS   Any Other Special Instructions Will Be Listed Below (If Applicable).  If you need a refill on your cardiac medications before your next appointment, please call your pharmacy.    Signed, Erma Heritage, PA-C  09/16/2017 4:38 PM    [facility] [address] [place], [place] [address] Phone: [phone]

## 2017-09-16 ENCOUNTER — Ambulatory Visit: Payer: Medicare Other | Admitting: Cardiology

## 2017-09-16 ENCOUNTER — Encounter: Payer: Self-pay | Admitting: Student

## 2017-09-16 ENCOUNTER — Ambulatory Visit (INDEPENDENT_AMBULATORY_CARE_PROVIDER_SITE_OTHER): Payer: Medicare Other | Admitting: Student

## 2017-09-16 VITALS — BP 134/64 | HR 61 | Ht 67.5 in | Wt 141.0 lb

## 2017-09-16 DIAGNOSIS — I25810 Atherosclerosis of coronary artery bypass graft(s) without angina pectoris: Secondary | ICD-10-CM

## 2017-09-16 DIAGNOSIS — I255 Ischemic cardiomyopathy: Secondary | ICD-10-CM

## 2017-09-16 DIAGNOSIS — E782 Mixed hyperlipidemia: Secondary | ICD-10-CM

## 2017-09-16 DIAGNOSIS — I10 Essential (primary) hypertension: Secondary | ICD-10-CM

## 2017-09-16 MED ORDER — METOPROLOL SUCCINATE ER 25 MG PO TB24: 25.0000 mg | ORAL_TABLET | Freq: Every day | ORAL | 3 refills | Status: DC

## 2017-09-16 NOTE — Patient Instructions (Signed)
Medication Instructions:  STOP LOPRESSOR   START TORPOL XL 25 MG- ONE TIME DAILY   Labwork: NONE  Testing/Procedures: NONE  Follow-Up: Your physician recommends that you schedule a follow-up appointment in: 3-4 MONTHS    Any Other Special Instructions Will Be Listed Below (If Applicable).     If you need a refill on your cardiac medications before your next appointment, please call your pharmacy.

## 2017-09-19 ENCOUNTER — Telehealth: Payer: Self-pay | Admitting: Student

## 2017-09-19 NOTE — Telephone Encounter (Signed)
Per phone call from pt's wife, she's not sure what medication he's supposed to stop taking. States she received a message tell him to stop one of his meds and she's unsure what medication that is as he doesn't have a medicine w/ that name

## 2017-09-19 NOTE — Telephone Encounter (Signed)
Spoke with pt's wife. Let her know to stop the metoprolol (lopressor) 25 mg BID, when he gets his new RX filled for the Toprol XL 25 mg daily. She voiced understanding.

## 2017-09-23 ENCOUNTER — Ambulatory Visit: Payer: Medicare Other | Admitting: Cardiology

## 2017-10-07 DIAGNOSIS — E782 Mixed hyperlipidemia: Secondary | ICD-10-CM | POA: Diagnosis not present

## 2017-10-07 DIAGNOSIS — I10 Essential (primary) hypertension: Secondary | ICD-10-CM | POA: Diagnosis not present

## 2017-10-07 DIAGNOSIS — D500 Iron deficiency anemia secondary to blood loss (chronic): Secondary | ICD-10-CM | POA: Diagnosis not present

## 2017-10-07 DIAGNOSIS — D649 Anemia, unspecified: Secondary | ICD-10-CM | POA: Diagnosis not present

## 2017-10-07 DIAGNOSIS — J449 Chronic obstructive pulmonary disease, unspecified: Secondary | ICD-10-CM | POA: Diagnosis not present

## 2017-10-10 DIAGNOSIS — Z1331 Encounter for screening for depression: Secondary | ICD-10-CM | POA: Diagnosis not present

## 2017-10-10 DIAGNOSIS — E782 Mixed hyperlipidemia: Secondary | ICD-10-CM | POA: Diagnosis not present

## 2017-10-10 DIAGNOSIS — I10 Essential (primary) hypertension: Secondary | ICD-10-CM | POA: Diagnosis not present

## 2017-10-10 DIAGNOSIS — I77811 Abdominal aortic ectasia: Secondary | ICD-10-CM | POA: Diagnosis not present

## 2017-10-10 DIAGNOSIS — I25812 Atherosclerosis of bypass graft of coronary artery of transplanted heart without angina pectoris: Secondary | ICD-10-CM | POA: Diagnosis not present

## 2017-10-10 DIAGNOSIS — D500 Iron deficiency anemia secondary to blood loss (chronic): Secondary | ICD-10-CM | POA: Diagnosis not present

## 2017-10-10 DIAGNOSIS — Z1389 Encounter for screening for other disorder: Secondary | ICD-10-CM | POA: Diagnosis not present

## 2017-10-28 ENCOUNTER — Other Ambulatory Visit: Payer: Self-pay | Admitting: Cardiology

## 2017-12-06 ENCOUNTER — Encounter: Payer: Self-pay | Admitting: Cardiology

## 2017-12-06 NOTE — Progress Notes (Signed)
Cardiology Office Note  Date: 12/07/2017   ID: Billy [name], DOB 01/20/1935, MRN [id number]  PCP: Curlene Labrum, MD  Primary Cardiologist: Rozann Lesches, MD   Chief Complaint  Patient presents with  . Coronary Artery Disease    History of Present Illness: Billy [name] is a 83 y.o. male last seen by Ms. Strader PA-C in April.  He is here with his wife for a routine visit.  He states that he has been doing quite well, no recurrent angina in the last 3 or 4 months.  He has been working outside, also doing some painting indoors.  Still working on Special educational needs teacher.  He reports NYHA class II dyspnea, no palpitations or syncope.  I reviewed his current medications.  Present cardiac regimen includes aspirin, Lipitor, Plavix, Imdur, Cozaar, Toprol-XL, and as needed nitroglycerin.  He had a follow-up echocardiogram in March showing improvement in LVEF to the range of 40 to 45%.  Past Medical History:  Diagnosis Date  . Arthritis   . Carotid artery disease ([place])    16-01% LICA and 0-93% RICA - December 2013  . Coronary atherosclerosis of native coronary artery    a. /p CABG in 2001 b. DESx2 to SVG-OM1 and staged DES to SVG-RCA in 04/2014 c. DES to SVG-PDA in 05/2015 d. DESx2 to SVG-OM1-OM2 in 04/2016 e. 06/2017: DESx2 to mid and distal body of SVG-RCA  . Essential hypertension, benign   . GERD (gastroesophageal reflux disease)   . Gout   . High cholesterol   . History of blood transfusion 2017  . HOH (hard of hearing)   . Myocardial infarction (Billy [name]) 2017  . Sinus bradycardia    Asymptomatic  . Skin cancer     Past Surgical History:  Procedure Laterality Date  . APPENDECTOMY     Ruptured  . CARDIAC CATHETERIZATION N/A 05/19/2015   Procedure: Left Heart Cath and Cors/Grafts Angiography;  Surgeon: Lorretta Harp, MD;  Location: [place] [facility];  Service: Cardiovascular;  Laterality: N/A;  . CARDIAC CATHETERIZATION N/A 05/19/2015   Procedure: Coronary Stent  Intervention;  Surgeon: Lorretta Harp, MD;  Location: [place] [facility];  Service: Cardiovascular;  Laterality: N/A;  . CARDIAC CATHETERIZATION N/A 04/23/2016   Procedure: LEFT HEART CATH AND CORS/GRAFTS ANGIOGRAPHY;  Surgeon: Leonie Man, MD;  Location: [place] [facility];  Service: Cardiovascular;  Laterality: N/A;  . CARDIAC CATHETERIZATION N/A 04/23/2016   Procedure: Coronary Stent Intervention;  Surgeon: Leonie Man, MD;  Location: [place] [facility];  Service: Cardiovascular;  Laterality: N/A;  . CATARACT EXTRACTION W/PHACO Right 11/13/2012   Procedure: CATARACT EXTRACTION PHACO AND INTRAOCULAR LENS PLACEMENT (IOC);  Surgeon: Tonny Branch, MD;  Location: [facility];  Service: Ophthalmology;  Laterality: Right;  CDE:12.75  . CATARACT EXTRACTION W/PHACO Left 01/11/2013   Procedure: CATARACT EXTRACTION PHACO AND INTRAOCULAR LENS PLACEMENT (IOC);  Surgeon: Tonny Branch, MD;  Location: [facility];  Service: Ophthalmology;  Laterality: Left;  CDE: 21.13  . COLONOSCOPY  09/2013   Dr. Burke Keels: colitis descending colon and sigmoid colon.Bx ?Cdiff vs ischemic.  Marland Kitchen CORONARY ANGIOPLASTY WITH STENT PLACEMENT  07/01/2017  . CORONARY ARTERY BYPASS GRAFT  11/09/1999   LIMA to LAD, SVG to diagonal, SVG to OM1 and OM2, SVG to PDA  . CORONARY STENT INTERVENTION N/A 07/01/2017   Procedure: CORONARY STENT INTERVENTION;  Surgeon: Burnell Blanks, MD;  Location: [place] [facility];  Service: Cardiovascular;  Laterality: N/A;  .  ENTEROSCOPY N/A 12/26/2015   Procedure: ENTEROSCOPY;  Surgeon: Daneil Dolin, MD;  [facility];  Service: Endoscopy;  Laterality: N/A;  . ESOPHAGOGASTRODUODENOSCOPY N/A 05/20/2015   Procedure: ESOPHAGOGASTRODUODENOSCOPY (EGD);  Surgeon: Wilford Corner, MD;  Location[facility];  Service: Endoscopy;  Laterality: N/A;  . ESOPHAGOGASTRODUODENOSCOPY N/A 12/26/2015   Procedure: ESOPHAGOGASTRODUODENOSCOPY (EGD);  Surgeon: Daneil Dolin, MD;  [facility];   Service: Endoscopy;  Laterality: N/A;  . GIVENS CAPSULE STUDY N/A 12/24/2015   Procedure: GIVENS CAPSULE STUDY;  Surgeon: Rogene Houston, MD;  [facility];  Service: Endoscopy;  Laterality: N/A;  . LEFT HEART CATH AND CORS/GRAFTS ANGIOGRAPHY N/A 07/01/2017   Procedure: LEFT HEART CATH AND CORS/GRAFTS ANGIOGRAPHY;  Surgeon: Burnell Blanks, MD;  Location: [place] [facility];  Service: Cardiovascular;  Laterality: N/A;  . LEFT HEART CATHETERIZATION WITH CORONARY ANGIOGRAM N/A 04/22/2014   Procedure: LEFT HEART CATHETERIZATION WITH CORONARY ANGIOGRAM;  Surgeon: Burnell Blanks, MD;  Location: [facility];  Service: Cardiovascular;  Laterality: N/A;  . PERCUTANEOUS CORONARY STENT INTERVENTION (PCI-S) N/A 04/25/2014   Procedure: PERCUTANEOUS CORONARY STENT INTERVENTION (PCI-S);  Surgeon: Peter M Martinique, MD;  Location: [facility] CATH LAB;  Service: Cardiovascular;  Laterality: N/A;  . PERIPHERAL VASCULAR CATHETERIZATION  04/23/2016   Procedure: Thrombectomy;  Surgeon: Leonie Man, MD;  Location: [place] [facility];  Service: Cardiovascular;;    Current Outpatient Medications  Medication Sig Dispense Refill  . aspirin 81 MG chewable tablet Chew 81 mg by mouth daily.    Marland Kitchen atorvastatin (LIPITOR) 10 MG tablet TAKE 1 TABLET BY MOUTH  DAILY 90 tablet 1  . B Complex Vitamins (VITAMIN B COMPLEX PO) Take by mouth.    . clopidogrel (PLAVIX) 75 MG tablet TAKE 1 TABLET BY MOUTH  DAILY 90 tablet 1  . COLCRYS 0.6 MG tablet Take 1 tablet by mouth Once daily as needed (for gout).     . cyclobenzaprine (FLEXERIL) 10 MG tablet Take 1 tablet (10 mg total) by mouth 3 times/day as needed-between meals & bedtime for muscle spasms. (Patient taking differently: Take 10 mg by mouth every other day. ) 30 tablet 0  . esomeprazole (NEXIUM) 20 MG capsule Take 1 capsule (20 mg total) by mouth daily at 12 noon. 90 capsule 1  . ferrous sulfate 325 (65 FE) MG tablet Take 325 mg by mouth every other day.    .  finasteride (PROSCAR) 5 MG tablet Take 5 mg by mouth daily.    . fluticasone (FLONASE) 50 MCG/ACT nasal spray Place 1 spray into both nostrils as needed for allergies or rhinitis.    Marland Kitchen isosorbide mononitrate (IMDUR) 60 MG 24 hr tablet Take 0.5 tablets (30 mg total) by mouth daily. 45 tablet 3  . losartan (COZAAR) 25 MG tablet Take 1 tablet (25 mg total) by mouth daily. 90 tablet 3  . Magnesium 250 MG TABS Take 1 tablet by mouth daily.    . metoprolol succinate (TOPROL XL) 25 MG 24 hr tablet Take 1 tablet (25 mg total) by mouth daily. 90 tablet 3  . nitroGLYCERIN (NITROSTAT) 0.4 MG SL tablet Place 1 tablet (0.4 mg total) under the tongue every 5 (five) minutes x 3 doses as needed. If no relief after 3rd dose, proceed to the ED for an evalutation 75 tablet 1  . tamsulosin (FLOMAX) 0.4 MG CAPS capsule Take 0.4 mg by mouth at bedtime.     No current facility-administered medications for this visit.  Allergies:  Protonix [pantoprazole sodium]   Social History: The patient  reports that he quit smoking about 18 years ago. His smoking use included cigarettes. He started smoking about 70 years ago. He has a 25.00 pack-year smoking history. He has never used smokeless tobacco. He reports that he does not drink alcohol or use drugs.   ROS:  Please see the history of present illness. Otherwise, complete review of systems is positive for hearing loss.  All other systems are reviewed and negative.   Physical Exam: VS:  BP 136/66   Pulse 68   Ht 5\' 7"  (1.702 m)   Wt 139 lb 9.6 oz (63.3 kg)   SpO2 95%   BMI 21.86 kg/m , BMI Body mass index is 21.86 kg/m.  Wt Readings from Last 3 Encounters:  12/07/17 139 lb 9.6 oz (63.3 kg)  09/16/17 141 lb (64 kg)  08/14/17 140 lb (63.5 kg)    General: Elderly male, appears comfortable at rest. HEENT: Conjunctiva and lids normal, oropharynx clear. Neck: Supple, no elevated JVP or carotid bruits, no thyromegaly. Lungs: Clear to auscultation, nonlabored  breathing at rest. Cardiac: Regular rate and rhythm, no S3 or significant systolic murmur, no pericardial rub. Abdomen: Soft, nontender, bowel sounds present. Extremities: No pitting edema, distal pulses 2+. Skin: Warm and dry. Musculoskeletal: No kyphosis. Neuropsychiatric: Alert and oriented x3, affect grossly appropriate.  ECG: I personally reviewed the tracing from 07/15/2017 which showed sinus rhythm with old anterior infarct pattern and nonspecific ST-T changes.  Recent Labwork: 08/14/2017: ALT 26; AST 21; BUN 22; Creatinine, Ser 1.26; Hemoglobin 12.1; Platelets 226; Potassium 4.3; Sodium 140     Component Value Date/Time   CHOL 121 04/23/2016 0346   TRIG 117 04/23/2016 0346   HDL 22 (L) 04/23/2016 0346   CHOLHDL 5.5 04/23/2016 0346   VLDL 23 04/23/2016 0346   LDLCALC 76 04/23/2016 0346    Other Studies Reviewed Today:  Echocardiogram 08/31/2017: Study Conclusions  - Left ventricle: The cavity size was normal. Wall thickness was   normal. Systolic function was mildly to moderately reduced. The   estimated ejection fraction was in the range of 40% to 45%. There   is akinesis of the inferolateral and inferior myocardium. Left   ventricular diastolic function parameters were normal for the   patient&'s age. - Aortic valve: Mildly to moderately calcified annulus. Trileaflet;   mildly calcified leaflets. There was trivial regurgitation. - Mitral valve: Mildly calcified annulus. Mildly thickened leaflets   . There was mild regurgitation. - Left atrium: The atrium was mildly dilated. - Right atrium: Central venous pressure (est): 3 mm Hg. - Atrial septum: No defect or patent foramen ovale was identified. - Tricuspid valve: There was trivial regurgitation. - Pulmonary arteries: PA peak pressure: 9 mm Hg (S). - Pericardium, extracardiac: There was no pericardial effusion.  Cardiac catheterization 07/02/2017:  Ost LM to Mid LM lesion is 50% stenosed.  Ost LAD to Prox LAD  lesion is 90% stenosed.  LIMA graft was visualized by angiography and is normal in caliber.  Ost Cx to Prox Cx lesion is 100% stenosed.  SVG graft was visualized by angiography and is normal in caliber.  Mid RCA lesion is 100% stenosed.  SVG graft was visualized by angiography and is normal in caliber.  Dist Graft to Insertion lesion is 99% stenosed.  A drug-eluting stent was successfully placed using a STENT SYNERGY DES 3X16.  Post intervention, there is a 0% residual stenosis.  A drug-eluting stent was successfully  placed.  Mid Graft lesion is 70% stenosed.  Post intervention, there is a 0% residual stenosis.  Prox Graft to Dist Graft lesion before 1st Mrg is 30% stenosed.  Mid Graft lesion between 1st Mrg and 2nd Mrg is 40% stenosed.  Mid Graft lesion is 40% stenosed.  Origin lesion is 30% stenosed.  There is mild to moderate left ventricular systolic dysfunction.  LV end diastolic pressure is normal.  The left ventricular ejection fraction is 35-45% by visual estimate.   1. Severe triple vessel CAD s/p 5 V CABG with 5/5 patent bypass grafts 2. The LAD has a high grade proximal stenosis, unchanged. The LIMA graft is patent to the mid LAD. The vein graft is patent to the Diagonal.  3. The Circumflex is occluded proximally. The vein graft to the OM1/OM2 is patent. The stents in this vein graft have mild restenosis.  4. The RCA is occluded in the mid segment. The vein graft to the RCA has a moderately severe stenosis in the mid body of the graft and the distal stented segment of the graft has 99% stenosis.  5. Moderate LV systolic function, LVEF estimated at 40%.  6. Successful PTCA/DES x 2 mid and distal body of SVG to RCA. No distal protection used as the distal lesion as within the distal stent and there was no good landing zone.   Recommendations: Continue DAPT with ASA and Plavix, continue beta blocker and statin.   Assessment and Plan:  1.  Multivessel CAD  status post CABG.  Cardiac catheterization in January showed patent bypass grafts with severe stenosis involving the SVG to RCA requiring stent intervention, DES x2 to the mid and distal body of the graft.  He is doing well at this time without active angina on medical therapy, continues on dual antiplatelet regimen without obvious bleeding problems.  Would continue with observation at this time.  2.  Ischemic cardiomyopathy, LVEF up to 40 to 45% range by echocardiogram in March.  He is tolerating current medical regimen well and has no evidence of fluid overload on examination.  No changes were made today.  3.  Mixed hyperlipidemia, he continues on statin therapy.  Keep follow-up with Dr. Pleas Koch.  4.  Essential hypertension, no changes made to current regimen.  Current medicines were reviewed with the patient today.  Disposition: Follow-up in 6 months.  Signed, Satira Sark, MD, [facility] 12/07/2017 11:13 AM    [place] [facility] [address], [place], [place] [address] Phone: [phone]; Fax: [phone]

## 2017-12-07 ENCOUNTER — Encounter: Payer: Self-pay | Admitting: Cardiology

## 2017-12-07 ENCOUNTER — Ambulatory Visit (INDEPENDENT_AMBULATORY_CARE_PROVIDER_SITE_OTHER): Payer: Medicare Other | Admitting: Cardiology

## 2017-12-07 VITALS — BP 136/66 | HR 68 | Ht 67.0 in | Wt 139.6 lb

## 2017-12-07 DIAGNOSIS — I25119 Atherosclerotic heart disease of native coronary artery with unspecified angina pectoris: Secondary | ICD-10-CM | POA: Diagnosis not present

## 2017-12-07 DIAGNOSIS — E782 Mixed hyperlipidemia: Secondary | ICD-10-CM

## 2017-12-07 DIAGNOSIS — I10 Essential (primary) hypertension: Secondary | ICD-10-CM

## 2017-12-07 DIAGNOSIS — I255 Ischemic cardiomyopathy: Secondary | ICD-10-CM

## 2017-12-07 NOTE — Patient Instructions (Addendum)
Medication Instructions:   Your physician recommends that you continue on your current medications as directed. Please refer to the Current Medication list given to you today.  Labwork:  NONE  Testing/Procedures:  NONE  Follow-Up:  Your physician recommends that you schedule a follow-up appointment in: 6 months with Dr. Domenic Polite at the [facility] office. You will receive a reminder letter in the mail in about 4 months reminding you to call and schedule your appointment. If you don't receive this letter, please contact our office.  Any Other Special Instructions Will Be Listed Below (If Applicable).  If you need a refill on your cardiac medications before your next appointment, please call your pharmacy.

## 2017-12-12 DIAGNOSIS — I77811 Abdominal aortic ectasia: Secondary | ICD-10-CM | POA: Diagnosis not present

## 2017-12-12 DIAGNOSIS — I10 Essential (primary) hypertension: Secondary | ICD-10-CM | POA: Diagnosis not present

## 2017-12-12 DIAGNOSIS — N189 Chronic kidney disease, unspecified: Secondary | ICD-10-CM | POA: Diagnosis not present

## 2017-12-12 DIAGNOSIS — D649 Anemia, unspecified: Secondary | ICD-10-CM | POA: Diagnosis not present

## 2017-12-12 DIAGNOSIS — M109 Gout, unspecified: Secondary | ICD-10-CM | POA: Diagnosis not present

## 2017-12-12 DIAGNOSIS — I25812 Atherosclerosis of bypass graft of coronary artery of transplanted heart without angina pectoris: Secondary | ICD-10-CM | POA: Diagnosis not present

## 2017-12-12 DIAGNOSIS — M1A0721 Idiopathic chronic gout, left ankle and foot, with tophus (tophi): Secondary | ICD-10-CM | POA: Diagnosis not present

## 2017-12-12 DIAGNOSIS — D500 Iron deficiency anemia secondary to blood loss (chronic): Secondary | ICD-10-CM | POA: Diagnosis not present

## 2017-12-26 ENCOUNTER — Other Ambulatory Visit: Payer: Self-pay | Admitting: Cardiology

## 2018-03-03 ENCOUNTER — Other Ambulatory Visit: Payer: Self-pay | Admitting: Cardiology

## 2018-03-13 ENCOUNTER — Telehealth: Payer: Self-pay | Admitting: Cardiology

## 2018-03-13 NOTE — Telephone Encounter (Signed)
Needs to verify if he is still suppose to be taking Plavix

## 2018-03-13 NOTE — Telephone Encounter (Signed)
Wife informed that pt is to continue taking Plavix. She voiced understanding.

## 2018-03-16 DIAGNOSIS — I25812 Atherosclerosis of bypass graft of coronary artery of transplanted heart without angina pectoris: Secondary | ICD-10-CM | POA: Diagnosis not present

## 2018-03-16 DIAGNOSIS — I10 Essential (primary) hypertension: Secondary | ICD-10-CM | POA: Diagnosis not present

## 2018-03-16 DIAGNOSIS — D500 Iron deficiency anemia secondary to blood loss (chronic): Secondary | ICD-10-CM | POA: Diagnosis not present

## 2018-03-16 DIAGNOSIS — M1A0721 Idiopathic chronic gout, left ankle and foot, with tophus (tophi): Secondary | ICD-10-CM | POA: Diagnosis not present

## 2018-03-16 DIAGNOSIS — I77811 Abdominal aortic ectasia: Secondary | ICD-10-CM | POA: Diagnosis not present

## 2018-03-16 DIAGNOSIS — Z23 Encounter for immunization: Secondary | ICD-10-CM | POA: Diagnosis not present

## 2018-04-01 ENCOUNTER — Other Ambulatory Visit: Payer: Self-pay | Admitting: Student

## 2018-05-01 ENCOUNTER — Other Ambulatory Visit: Payer: Self-pay | Admitting: Cardiology

## 2018-06-18 ENCOUNTER — Other Ambulatory Visit: Payer: Self-pay | Admitting: Cardiology

## 2018-06-19 DIAGNOSIS — R5383 Other fatigue: Secondary | ICD-10-CM | POA: Diagnosis not present

## 2018-06-19 DIAGNOSIS — E782 Mixed hyperlipidemia: Secondary | ICD-10-CM | POA: Diagnosis not present

## 2018-06-19 DIAGNOSIS — R319 Hematuria, unspecified: Secondary | ICD-10-CM | POA: Diagnosis not present

## 2018-06-19 DIAGNOSIS — I10 Essential (primary) hypertension: Secondary | ICD-10-CM | POA: Diagnosis not present

## 2018-06-19 DIAGNOSIS — D500 Iron deficiency anemia secondary to blood loss (chronic): Secondary | ICD-10-CM | POA: Diagnosis not present

## 2018-06-20 DIAGNOSIS — H6121 Impacted cerumen, right ear: Secondary | ICD-10-CM | POA: Diagnosis not present

## 2018-06-20 DIAGNOSIS — I10 Essential (primary) hypertension: Secondary | ICD-10-CM | POA: Diagnosis not present

## 2018-06-20 DIAGNOSIS — Z6822 Body mass index (BMI) 22.0-22.9, adult: Secondary | ICD-10-CM | POA: Diagnosis not present

## 2018-06-20 DIAGNOSIS — Z0001 Encounter for general adult medical examination with abnormal findings: Secondary | ICD-10-CM | POA: Diagnosis not present

## 2018-07-03 DIAGNOSIS — C44612 Basal cell carcinoma of skin of right upper limb, including shoulder: Secondary | ICD-10-CM | POA: Diagnosis not present

## 2018-07-03 DIAGNOSIS — B078 Other viral warts: Secondary | ICD-10-CM | POA: Diagnosis not present

## 2018-07-10 ENCOUNTER — Telehealth: Payer: Self-pay | Admitting: Cardiology

## 2018-07-10 DIAGNOSIS — R42 Dizziness and giddiness: Secondary | ICD-10-CM | POA: Diagnosis not present

## 2018-07-10 DIAGNOSIS — W19XXXA Unspecified fall, initial encounter: Secondary | ICD-10-CM | POA: Diagnosis not present

## 2018-07-10 NOTE — Telephone Encounter (Signed)
Wife called stating that patient fell in the floor approximately an hour ago. EMS was called they did an EKG on patient. Stated to wife that he was dehydrated.

## 2018-07-10 NOTE — Telephone Encounter (Signed)
Thank you for update.  We can certainly try to get him on the add-on schedule.  Alternatively, if the situation is dehydration and not specifically cardiac, he could always see his PCP Dr. Pleas Koch for a basic work-up.  I would say however that if his symptoms continue to recur, he should be seen in the ER more urgently.

## 2018-07-10 NOTE — Telephone Encounter (Signed)
Pt wife says pt fell after walking from kitchen to living room says he was weak/pale color and said"I don't think im going to make it" then fell - wife says that he did not pass out - wife called EMS who did EKG and vitals (wife says they were normal)  and EMS did not recommend pt be transported to ED - says EMS suggested pt was dehydrated - wife wanting pt to be seen sooner however we don't have any appts before 2/20 when pt is scheduled to see Dr Domenic Polite - will add to wait list and forward to provider

## 2018-07-12 NOTE — Telephone Encounter (Signed)
F/u with pt and says he has been feeling well - no symptoms since EMS visit - pt will f/u if they re occur

## 2018-07-16 ENCOUNTER — Other Ambulatory Visit: Payer: Self-pay | Admitting: Cardiology

## 2018-07-26 NOTE — Progress Notes (Signed)
Cardiology Office Note  Date: 07/27/2018   ID: Billy [name], DOB August 02, 1934, MRN [id number]  PCP: Curlene Labrum, MD  Primary Cardiologist: Rozann Lesches, MD   Chief Complaint  Patient presents with  . Coronary Artery Disease    History of Present Illness:  Billy [name] is an 84 y.o. male last seen in July 2019.  He is here with today with his wife for a follow-up visit.  Overall states that he has been doing well without obvious angina symptoms.  He did have an episode a few weeks ago where he had near syncope while standing.  He was working in his shop on a model [employer] and felt lightheaded after being on his feet for several minutes.  EMS was summoned but he did not require transport by report.  He has had no recurrent symptoms since that time.  Reviewed his medications which are outlined below.  He reports compliance.  I talked with him about getting a home blood pressure cuff to make sure he is not having lower blood pressures or orthostasis that might require reduction in medication doses.  Last echocardiogram was in March 2019 at which time LVEF was 40 to 45%.  We discussed obtaining a follow-up study.  Past Medical History:  Diagnosis Date  . Arthritis   . Carotid artery disease ([place])    89-38% LICA and 1-01% RICA - December 2013  . Coronary atherosclerosis of native coronary artery    a. /p CABG in 2001 b. DESx2 to SVG-OM1 and staged DES to SVG-RCA in 04/2014 c. DES to SVG-PDA in 05/2015 d. DESx2 to SVG-OM1-OM2 in 04/2016 e. 06/2017: DESx2 to mid and distal body of SVG-RCA  . Essential hypertension, benign   . GERD (gastroesophageal reflux disease)   . Gout   . High cholesterol   . History of blood transfusion 2017  . HOH (hard of hearing)   . Myocardial infarction ([place]) 2017  . Sinus bradycardia    Asymptomatic  . Skin cancer     Past Surgical History:  Procedure Laterality Date  . APPENDECTOMY     Ruptured  . CARDIAC CATHETERIZATION N/A 05/19/2015   Procedure: Left Heart Cath and Cors/Grafts Angiography;  Surgeon: Lorretta Harp, MD;  Location: [place] [facility];  Service: Cardiovascular;  Laterality: N/A;  . CARDIAC CATHETERIZATION N/A 05/19/2015   Procedure: Coronary Stent Intervention;  Surgeon: Lorretta Harp, MD;  Location: [place] [facility];  Service: Cardiovascular;  Laterality: N/A;  . CARDIAC CATHETERIZATION N/A 04/23/2016   Procedure: LEFT HEART CATH AND CORS/GRAFTS ANGIOGRAPHY;  Surgeon: Leonie Man, MD;  Location: [place] [facility];  Service: Cardiovascular;  Laterality: N/A;  . CARDIAC CATHETERIZATION N/A 04/23/2016   Procedure: Coronary Stent Intervention;  Surgeon: Leonie Man, MD;  Location: [place] [facility];  Service: Cardiovascular;  Laterality: N/A;  . CATARACT EXTRACTION W/PHACO Right 11/13/2012   Procedure: CATARACT EXTRACTION PHACO AND INTRAOCULAR LENS PLACEMENT (IOC);  Surgeon: Tonny Branch, MD;  Location: [facility];  Service: Ophthalmology;  Laterality: Right;  CDE:12.75  . CATARACT EXTRACTION W/PHACO Left 01/11/2013   Procedure: CATARACT EXTRACTION PHACO AND INTRAOCULAR LENS PLACEMENT (IOC);  Surgeon: Tonny Branch, MD;  Location: [facility];  Service: Ophthalmology;  Laterality: Left;  CDE: 21.13  . COLONOSCOPY  09/2013   Dr. Burke Keels: colitis descending colon and sigmoid colon.Bx ?Cdiff vs ischemic.  Marland Kitchen CORONARY ANGIOPLASTY WITH STENT PLACEMENT  07/01/2017  . CORONARY ARTERY BYPASS GRAFT  11/09/1999  LIMA to LAD, SVG to diagonal, SVG to OM1 and OM2, SVG to PDA  . CORONARY STENT INTERVENTION N/A 07/01/2017   Procedure: CORONARY STENT INTERVENTION;  Surgeon: Burnell Blanks, MD;  Location: [place];  Service: Cardiovascular;  Laterality: N/A;  . ENTEROSCOPY N/A 12/26/2015   Procedure: ENTEROSCOPY;  Surgeon: Daneil Dolin, MD;  [facility];  Service: Endoscopy;  Laterality: N/A;  . ESOPHAGOGASTRODUODENOSCOPY N/A 05/20/2015   Procedure: ESOPHAGOGASTRODUODENOSCOPY (EGD);  Surgeon: Wilford Corner, MD;  Location: [facility] ENDOS[facility];  Service: Endoscopy;  Laterality: N/A;  . ESOPHAGOGASTRODUODENOSCOPY N/A 12/26/2015   Procedure: ESOPHAGOGASTRODUODENOSCOPY (EGD);  Surgeon: Daneil Dolin, MD;  [facility];  Service: Endoscopy;  Laterality: N/A;  . GIVENS CAPSULE STUDY N/A 12/24/2015   Procedure: GIVENS CAPSULE STUDY;  Surgeon: Rogene Houston, MD;  [facility];  Service: Endoscopy;  Laterality: N/A;  . LEFT HEART CATH AND CORS/GRAFTS ANGIOGRAPHY N/A 07/01/2017   Procedure: LEFT HEART CATH AND CORS/GRAFTS ANGIOGRAPHY;  Surgeon: Burnell Blanks, MD;  Location: [place] [facility];  Service: Cardiovascular;  Laterality: N/A;  . LEFT HEART CATHETERIZATION WITH CORONARY ANGIOGRAM N/A 04/22/2014   Procedure: LEFT HEART CATHETERIZATION WITH CORONARY ANGIOGRAM;  Surgeon: Burnell Blanks, MD;  Location: [facility] CATH LAB;  Service: Cardiovascular;  Laterality: N/A;  . PERCUTANEOUS CORONARY STENT INTERVENTION (PCI-S) N/A 04/25/2014   Procedure: PERCUTANEOUS CORONARY STENT INTERVENTION (PCI-S);  Surgeon: Peter M Martinique, MD;  Location: [facility] C[facility] LAB;  Service: Cardiovascular;  Laterality: N/A;  . PERIPHERAL VASCULAR CATHETERIZATION  04/23/2016   Procedure: Thrombectomy;  Surgeon: Leonie Man, MD;  Location: [place] [facility];  Service: Cardiovascular;;    Current Outpatient Medications  Medication Sig Dispense Refill  . aspirin 81 MG chewable tablet Chew 81 mg by mouth daily.    Marland Kitchen atorvastatin (LIPITOR) 10 MG tablet TAKE 1 TABLET BY MOUTH  DAILY 90 tablet 3  . B Complex Vitamins (VITAMIN B COMPLEX PO) Take by mouth.    . clopidogrel (PLAVIX) 75 MG tablet TAKE 1 TABLET BY MOUTH  DAILY 90 tablet 1  . COLCRYS 0.6 MG tablet Take 1 tablet by mouth Once daily as needed (for gout).     . cyclobenzaprine (FLEXERIL) 10 MG tablet Take 1 tablet (10 mg total) by mouth 3 times/day as needed-between meals & bedtime for muscle spasms. (Patient taking differently: Take 10 mg by  mouth every other day. ) 30 tablet 0  . esomeprazole (NEXIUM) 20 MG capsule TAKE 1 CAPSULE BY MOUTH  DAILY AT NOON. 90 capsule 3  . ferrous sulfate 325 (65 FE) MG tablet Take 325 mg by mouth every other day.    . finasteride (PROSCAR) 5 MG tablet Take 5 mg by mouth daily.    . fluticasone (FLONASE) 50 MCG/ACT nasal spray Place 1 spray into both nostrils as needed for allergies or rhinitis.    Marland Kitchen isosorbide mononitrate (IMDUR) 60 MG 24 hr tablet TAKE ONE-HALF TABLET BY  MOUTH DAILY 45 tablet 3  . losartan (COZAAR) 25 MG tablet TAKE 1 TABLET BY MOUTH  DAILY 90 tablet 3  . Magnesium 250 MG TABS Take 1 tablet by mouth daily.    . metoprolol succinate (TOPROL-XL) 25 MG 24 hr tablet TAKE 1 TABLET BY MOUTH  DAILY 90 tablet 3  . nitroGLYCERIN (NITROSTAT) 0.4 MG SL tablet Place 1 tablet (0.4 mg total) under the tongue every 5 (five) minutes x 3 doses as needed. If no relief after 3rd dose,  proceed to the ED for an evalutation 75 tablet 1  . tamsulosin (FLOMAX) 0.4 MG CAPS capsule Take 0.4 mg by mouth at bedtime.     No current facility-administered medications for this visit.    Allergies:  Protonix [pantoprazole sodium]   Social History: The patient  reports that he quit smoking about 19 years ago. His smoking use included cigarettes. He started smoking about 71 years ago. He has a 25.00 pack-year smoking history. He has never used smokeless tobacco. He reports that he does not drink alcohol or use drugs.   ROS:  Please see the history of present illness. Otherwise, complete review of systems is positive for hearing loss.  All other systems are reviewed and negative.   Physical Exam: VS:  BP 137/65   Pulse 66   Ht 5\' 7"  (1.702 m)   Wt 143 lb 3.2 oz (65 kg)   SpO2 95%   BMI 22.43 kg/m , BMI Body mass index is 22.43 kg/m.  Wt Readings from Last 3 Encounters:  07/27/18 143 lb 3.2 oz (65 kg)  12/07/17 139 lb 9.6 oz (63.3 kg)  09/16/17 141 lb (64 kg)    General: Elderly male, appears comfortable  at rest. HEENT: Conjunctiva and lids normal, oropharynx clear. Neck: Supple, no elevated JVP or carotid bruits, no thyromegaly. Lungs: Clear to auscultation, nonlabored breathing at rest. Cardiac: Regular rate and rhythm, no S3 or significant systolic murmur. Abdomen: Soft, nontender, bowel sounds present. Extremities: No pitting edema, distal pulses 2+. Skin: Warm and dry. Musculoskeletal: No kyphosis. Neuropsychiatric: Alert and oriented x3, affect grossly appropriate.  ECG: I personally reviewed the tracing from 07/15/2017 which showed sinus rhythm with old anterior infarct pattern and nonspecific ST-T changes.  Recent Labwork: 08/14/2017: ALT 26; AST 21; BUN 22; Creatinine, Ser 1.26; Hemoglobin 12.1; Platelets 226; Potassium 4.3; Sodium 140     Component Value Date/Time   CHOL 121 04/23/2016 0346   TRIG 117 04/23/2016 0346   HDL 22 (L) 04/23/2016 0346   CHOLHDL 5.5 04/23/2016 0346   VLDL 23 04/23/2016 0346   LDLCALC 76 04/23/2016 0346    Other Studies Reviewed Today:  Echocardiogram 08/31/2017: Study Conclusions  - Left ventricle: The cavity size was normal. Wall thickness was normal. Systolic function was mildly to moderately reduced. The estimated ejection fraction was in the range of 40% to 45%. There is akinesis of the inferolateral and inferior myocardium. Left ventricular diastolic function parameters were normal for the patient&'s age. - Aortic valve: Mildly to moderately calcified annulus. Trileaflet; mildly calcified leaflets. There was trivial regurgitation. - Mitral valve: Mildly calcified annulus. Mildly thickened leaflets . There was mild regurgitation. - Left atrium: The atrium was mildly dilated. - Right atrium: Central venous pressure (est): 3 mm Hg. - Atrial septum: No defect or patent foramen ovale was identified. - Tricuspid valve: There was trivial regurgitation. - Pulmonary arteries: PA peak pressure: 9 mm Hg (S). - Pericardium,  extracardiac: There was no pericardial effusion.  Cardiac catheterization 07/02/2017:  Ost LM to Mid LM lesion is 50% stenosed.  Ost LAD to Prox LAD lesion is 90% stenosed.  LIMA graft was visualized by angiography and is normal in caliber.  Ost Cx to Prox Cx lesion is 100% stenosed.  SVG graft was visualized by angiography and is normal in caliber.  Mid RCA lesion is 100% stenosed.  SVG graft was visualized by angiography and is normal in caliber.  Dist Graft to Insertion lesion is 99% stenosed.  A drug-eluting stent  was successfully placed using a STENT SYNERGY DES 3X16.  Post intervention, there is a 0% residual stenosis.  A drug-eluting stent was successfully placed.  Mid Graft lesion is 70% stenosed.  Post intervention, there is a 0% residual stenosis.  Prox Graft to Dist Graft lesion before 1st Mrg is 30% stenosed.  Mid Graft lesion between 1st Mrg and 2nd Mrg is 40% stenosed.  Mid Graft lesion is 40% stenosed.  Origin lesion is 30% stenosed.  There is mild to moderate left ventricular systolic dysfunction.  LV end diastolic pressure is normal.  The left ventricular ejection fraction is 35-45% by visual estimate.  1. Severe triple vessel CAD s/p 5 V CABG with 5/5 patent bypass grafts 2. The LAD has a high grade proximal stenosis, unchanged. The LIMA graft is patent to the mid LAD. The vein graft is patent to the Diagonal.  3. The Circumflex is occluded proximally. The vein graft to the OM1/OM2 is patent. The stents in this vein graft have mild restenosis.  4. The RCA is occluded in the mid segment. The vein graft to the RCA has a moderately severe stenosis in the mid body of the graft and the distal stented segment of the graft has 99% stenosis.  5. Moderate LV systolic function, LVEF estimated at 40%.  6. Successful PTCA/DES x 2 mid and distal body of SVG to RCA. No distal protection used as the distal lesion as within the distal stent and there was no good  landing zone.   Recommendations: Continue DAPT with ASA and Plavix, continue beta blocker and statin.   Assessment and Plan:  1.  Multivessel CAD status post CABG with subsequent graft disease and most recently intervention to the SVG to RCA with DES x2 in January 2019.  He reports no progressive angina symptoms and we will plan to continue medical therapy and observation.  He remains on dual antiplatelet regimen.  2.  Recent reported episode of near syncope while standing.  States that he occasionally gets dizzy when he stands up.  I asked him to get a home blood pressure cuff to record blood pressure intermittently in case we need to make any down titration and medications.  Otherwise, his blood pressure is reasonable today and medications will continue at present dose for now.  3.  Ischemic cardiomyopathy with LVEF 40 to 45% by echocardiogram in March of last year.  Follow-up study will be obtained.  4.  Mixed hyperlipidemia, continues on Lipitor.  Current medicines were reviewed with the patient today.   Orders Placed This Encounter  Procedures  . EKG 12-Lead  . ECHOCARDIOGRAM COMPLETE    Disposition: Follow-up in 6 months.  Signed, Satira Sark, MD, [facility] 07/27/2018 11:01 AM    [facility] [place], [place], [place] [address] Phone: [phone]; Fax: [phone]

## 2018-07-27 ENCOUNTER — Encounter: Payer: Self-pay | Admitting: Cardiology

## 2018-07-27 ENCOUNTER — Ambulatory Visit (INDEPENDENT_AMBULATORY_CARE_PROVIDER_SITE_OTHER): Payer: Medicare Other | Admitting: Cardiology

## 2018-07-27 ENCOUNTER — Encounter

## 2018-07-27 VITALS — BP 137/65 | HR 66 | Ht 67.0 in | Wt 143.2 lb

## 2018-07-27 DIAGNOSIS — E782 Mixed hyperlipidemia: Secondary | ICD-10-CM | POA: Diagnosis not present

## 2018-07-27 DIAGNOSIS — I255 Ischemic cardiomyopathy: Secondary | ICD-10-CM | POA: Diagnosis not present

## 2018-07-27 DIAGNOSIS — R42 Dizziness and giddiness: Secondary | ICD-10-CM | POA: Diagnosis not present

## 2018-07-27 DIAGNOSIS — I25119 Atherosclerotic heart disease of native coronary artery with unspecified angina pectoris: Secondary | ICD-10-CM

## 2018-07-27 NOTE — Patient Instructions (Addendum)

## 2018-08-10 ENCOUNTER — Ambulatory Visit (INDEPENDENT_AMBULATORY_CARE_PROVIDER_SITE_OTHER): Payer: Medicare Other

## 2018-08-10 DIAGNOSIS — I255 Ischemic cardiomyopathy: Secondary | ICD-10-CM | POA: Diagnosis not present

## 2018-08-11 ENCOUNTER — Telehealth: Payer: Self-pay | Admitting: *Deleted

## 2018-08-11 NOTE — Telephone Encounter (Signed)
Wife informed. Copy sent to PCP 

## 2018-08-11 NOTE — Telephone Encounter (Signed)
-----   Message from Satira Sark, MD sent at 08/10/2018  4:10 PM EST ----- Results reviewed.  LVEF stable at 45% consistent with ischemic cardiomyopathy and his history.  Continue with current medications and follow-up plan. A copy of this test should be forwarded to [place], Virgina Evener, MD.

## 2018-09-10 ENCOUNTER — Other Ambulatory Visit: Payer: Self-pay | Admitting: Cardiology

## 2018-09-18 ENCOUNTER — Other Ambulatory Visit: Payer: Self-pay

## 2018-09-18 MED ORDER — NITROGLYCERIN 0.4 MG SL SUBL: 0.4000 mg | SUBLINGUAL_TABLET | SUBLINGUAL | 3 refills | Status: DC | PRN

## 2018-09-18 NOTE — Telephone Encounter (Signed)
refilled NTG [facility]

## 2018-09-20 ENCOUNTER — Telehealth: Payer: Self-pay | Admitting: Cardiology

## 2018-09-20 NOTE — Telephone Encounter (Signed)
Noted. Agree with hydration efforts. Would be useful to try and get some blood pressure and heart rate measurements around the times that he feels dizzy. We may need to consider cutting back on some of his medications, but otherwise he has done generally well and I would not want to just cut back unless he is actually having hypotension.

## 2018-09-20 NOTE — Telephone Encounter (Signed)
[name] (wife) called stating that patient was outside yesterday working and became dizzy. States that he continues to have these dizzy spells.

## 2018-09-20 NOTE — Telephone Encounter (Signed)
Pt wife says over the last month pt has became dizzy and lightheaded on exertion lasting about 5-10 mins at a time and happening at least every other day. Says he has been increasing fluid intake with the warmer weather since he has gotten dehydrated in the past. Hasn't check BP/HR. Denies SOB/swelling/weight gain/chest pain.

## 2018-09-21 NOTE — Telephone Encounter (Signed)
Pt wife voiced understanding and will check blood pressure and HR during dizzy spells (has home blood pressure monitor) and call us back with readings next week

## 2018-09-29 ENCOUNTER — Telehealth: Payer: Self-pay | Admitting: Cardiology

## 2018-09-29 NOTE — Telephone Encounter (Signed)
PT'S BP IS RUNNING LOW AND HE'S DIZZY  97/46 was his reading this afternoon.

## 2018-09-29 NOTE — Telephone Encounter (Signed)
Per wife, patient c/o dizziness when standing up and sometimes when moving around. Per wife, patient takes his time when changing positions. Also says on Sunday while patient reported being dizzy, BP was 102/51 & HR 79 & today while reporting dizziness, BP was 97/46 & HR 80. Reports h/o sob for the past year and is unchanged. Denies chest pain. Reports eating and drinking okay. Medication reconciled. BP taken while on phone and was 106/48 & HR 71. Denies dizziness at this time. Advised to break losartan in half daily, continue monitoring BP daily and when dizzy, continue to change positions slowly, and if symptoms got worse, to go to the ED for an evaluation. Verbalized understanding of plan.

## 2018-10-01 NOTE — Telephone Encounter (Signed)
Agree.  Reduce losartan to 12.5 mg daily and take it in the evening.

## 2018-10-02 MED ORDER — LOSARTAN POTASSIUM 25 MG PO TABS: 12.5000 mg | ORAL_TABLET | Freq: Every evening | ORAL | 3 refills | Status: DC

## 2018-10-02 NOTE — Telephone Encounter (Signed)
Wife informed and verbalized understanding of plan. 

## 2018-10-20 ENCOUNTER — Telehealth: Payer: Self-pay | Admitting: Cardiology

## 2018-10-20 NOTE — Telephone Encounter (Signed)
Patients wife informed and verbalized understanding

## 2018-10-20 NOTE — Telephone Encounter (Signed)
Patient would like to discuss BP med changes with nurse/tg

## 2018-10-20 NOTE — Telephone Encounter (Signed)
Per wife, patient c/o being dizzy and she took vitals at that time. BP today was 102/56 & HR 68. Per wife, she does not check BP daily but only when patient report symptoms. 09/30/2018 126/64 & HR 71; 10/06/2018 129/62 & HR 65; 10/12/2018 109/57 & HR 71. Per wife, BP monitor is relative new and has fresh batteries. Advised that message would be sent to provider for advice.

## 2018-10-20 NOTE — Telephone Encounter (Signed)
Let's have him completely stop Cozaar for the time being and see if this helps him to feel better.

## 2018-10-27 ENCOUNTER — Telehealth: Payer: Self-pay | Admitting: Cardiology

## 2018-10-27 NOTE — Telephone Encounter (Signed)
Left detailed message.   

## 2018-10-27 NOTE — Telephone Encounter (Signed)
We recently stopped Cozaar, would stay off for now.

## 2018-10-27 NOTE — Telephone Encounter (Signed)
Blood pressure readings as per 5/15 phone note  16TH 118/62 HR 76 17TH 136/63 HR 79 18TH 146/76 HR 70  20TH 133/70 HR 65 21ST 129/63 HR 70  22ND 105/75 HR 78

## 2018-10-27 NOTE — Telephone Encounter (Signed)
Calling in week of BP

## 2018-12-14 DIAGNOSIS — Z23 Encounter for immunization: Secondary | ICD-10-CM | POA: Diagnosis not present

## 2018-12-14 DIAGNOSIS — M7022 Olecranon bursitis, left elbow: Secondary | ICD-10-CM | POA: Diagnosis not present

## 2018-12-14 DIAGNOSIS — I255 Ischemic cardiomyopathy: Secondary | ICD-10-CM | POA: Diagnosis not present

## 2018-12-14 DIAGNOSIS — I10 Essential (primary) hypertension: Secondary | ICD-10-CM | POA: Diagnosis not present

## 2018-12-14 DIAGNOSIS — D500 Iron deficiency anemia secondary to blood loss (chronic): Secondary | ICD-10-CM | POA: Diagnosis not present

## 2018-12-14 DIAGNOSIS — Z1389 Encounter for screening for other disorder: Secondary | ICD-10-CM | POA: Diagnosis not present

## 2018-12-14 DIAGNOSIS — J449 Chronic obstructive pulmonary disease, unspecified: Secondary | ICD-10-CM | POA: Diagnosis not present

## 2018-12-14 DIAGNOSIS — I25812 Atherosclerosis of bypass graft of coronary artery of transplanted heart without angina pectoris: Secondary | ICD-10-CM | POA: Diagnosis not present

## 2018-12-15 DIAGNOSIS — N189 Chronic kidney disease, unspecified: Secondary | ICD-10-CM | POA: Diagnosis not present

## 2018-12-15 DIAGNOSIS — D649 Anemia, unspecified: Secondary | ICD-10-CM | POA: Diagnosis not present

## 2018-12-15 DIAGNOSIS — M109 Gout, unspecified: Secondary | ICD-10-CM | POA: Diagnosis not present

## 2018-12-15 DIAGNOSIS — I2510 Atherosclerotic heart disease of native coronary artery without angina pectoris: Secondary | ICD-10-CM | POA: Diagnosis not present

## 2018-12-27 ENCOUNTER — Other Ambulatory Visit: Payer: Self-pay

## 2018-12-27 NOTE — Patient Outreach (Signed)
[facility]) Care Management  12/27/2018  Billy [name] [birth year] [id number]   Medication Adherence call to [name] spoke with patient wife she explain patient is taking Atorvastatin 10 mg once daily and has received it from [facility] and has enough for three month. [name] is showing past due under [place].    [place] Management Direct Dial [phone]  Fax [phone] [email or web address]

## 2019-01-09 ENCOUNTER — Other Ambulatory Visit: Payer: Self-pay

## 2019-01-09 NOTE — Patient Outreach (Signed)
[facility]) Care Management  01/09/2019  BROWN DUNLAP Aug 22, 1934 [id number]   Medication Adherence call to Mr. Abdirahim Wins[facility] [employer] [facility] spoke with patients wife she explain patient is hard of hearing patient is past due on Losartan 25 mg and Atorvastatin 10 mg patient is no longer taking Losartan 25 mg doctor took him off on Atorvastatin he received an order from [facility] and has enough for three month. Mr. Ang is showing past due under [place].   [place] Management Direct Dial [phone]  Fax [phone] [email or web address]

## 2019-01-23 ENCOUNTER — Ambulatory Visit (INDEPENDENT_AMBULATORY_CARE_PROVIDER_SITE_OTHER): Payer: Medicare Other | Admitting: Urology

## 2019-01-23 DIAGNOSIS — N401 Enlarged prostate with lower urinary tract symptoms: Secondary | ICD-10-CM | POA: Diagnosis not present

## 2019-01-23 DIAGNOSIS — R351 Nocturia: Secondary | ICD-10-CM

## 2019-01-23 DIAGNOSIS — R310 Gross hematuria: Secondary | ICD-10-CM

## 2019-01-24 ENCOUNTER — Ambulatory Visit: Payer: Medicare Other | Admitting: Cardiology

## 2019-02-15 ENCOUNTER — Ambulatory Visit: Payer: Medicare Other | Admitting: Cardiology

## 2019-03-07 DIAGNOSIS — I10 Essential (primary) hypertension: Secondary | ICD-10-CM | POA: Diagnosis not present

## 2019-03-07 DIAGNOSIS — E782 Mixed hyperlipidemia: Secondary | ICD-10-CM | POA: Diagnosis not present

## 2019-03-09 ENCOUNTER — Other Ambulatory Visit: Payer: Self-pay

## 2019-03-09 ENCOUNTER — Encounter: Payer: Self-pay | Admitting: Cardiology

## 2019-03-09 ENCOUNTER — Ambulatory Visit (INDEPENDENT_AMBULATORY_CARE_PROVIDER_SITE_OTHER): Payer: Medicare Other | Admitting: Cardiology

## 2019-03-09 VITALS — BP 142/82 | HR 87 | Ht 65.5 in | Wt 140.0 lb

## 2019-03-09 DIAGNOSIS — R42 Dizziness and giddiness: Secondary | ICD-10-CM | POA: Diagnosis not present

## 2019-03-09 DIAGNOSIS — E782 Mixed hyperlipidemia: Secondary | ICD-10-CM | POA: Diagnosis not present

## 2019-03-09 DIAGNOSIS — I25119 Atherosclerotic heart disease of native coronary artery with unspecified angina pectoris: Secondary | ICD-10-CM | POA: Diagnosis not present

## 2019-03-09 DIAGNOSIS — Z23 Encounter for immunization: Secondary | ICD-10-CM

## 2019-03-09 DIAGNOSIS — I255 Ischemic cardiomyopathy: Secondary | ICD-10-CM | POA: Diagnosis not present

## 2019-03-09 NOTE — Patient Instructions (Signed)

## 2019-03-09 NOTE — Progress Notes (Signed)
Cardiology Office Note  Date: 03/09/2019   ID: Billy [name], DOB 11/14/1934, MRN [id number]  PCP:  Billy Labrum, MD  Cardiologist:  Rozann Lesches, MD Electrophysiologist:  None   Chief Complaint  Patient presents with  . Cardiac follow-up    History of Present Illness: Billy [name] is an 84 y.o. male last seen in February.  He is here today with his wife for a follow-up visit.  He reports no progressive angina symptoms or nitroglycerin use.  States that he enjoys working on [employer], also outdoors.  Since last encounter he was taken off Cozaar due to relatively low blood pressures.  He reports that his orthostatic dizziness has improved since that time, systolic is in the 124P today.  I personally reviewed an ECG obtained back in July per PCP which shows sinus rhythm with nonspecific ST-T changes.  We went over his medications today which are stable from a cardiac perspective and outlined below.  Follow-up echocardiogram in March showed LVEF 45%, moderate mitral regurgitation, normal RV contraction.  Past Medical History:  Diagnosis Date  . Arthritis   . Carotid artery disease ([place])    80-99% LICA and 8-33% RICA - December 2013  . Coronary atherosclerosis of native coronary artery    a. /p CABG in 2001 b. DESx2 to SVG-OM1 and staged DES to SVG-RCA in 04/2014 c. DES to SVG-PDA in 05/2015 d. DESx2 to SVG-OM1-OM2 in 04/2016 e. 06/2017: DESx2 to mid and distal body of SVG-RCA  . Essential hypertension, benign   . GERD (gastroesophageal reflux disease)   . Gout   . High cholesterol   . History of blood transfusion 2017  . HOH (hard of hearing)   . Myocardial infarction ([place]) 2017  . Sinus bradycardia    Asymptomatic  . Skin cancer     Past Surgical History:  Procedure Laterality Date  . APPENDECTOMY     Ruptured  . CARDIAC CATHETERIZATION N/A 05/19/2015   Procedure: Left Heart Cath and Cors/Grafts Angiography;  Surgeon: Lorretta Harp, MD;  Location: [place] [facility];  Service: Cardiovascular;  Laterality: N/A;  . CARDIAC CATHETERIZATION N/A 05/19/2015   Procedure: Coronary Stent Intervention;  Surgeon: Lorretta Harp, MD;  Location: [place] [facility];  Service: Cardiovascular;  Laterality: N/A;  . CARDIAC CATHETERIZATION N/A 04/23/2016   Procedure: LEFT HEART CATH AND CORS/GRAFTS ANGIOGRAPHY;  Surgeon: Leonie Man, MD;  Location: [place] [facility];  Service: Cardiovascular;  Laterality: N/A;  . CARDIAC CATHETERIZATION N/A 04/23/2016   Procedure: Coronary Stent Intervention;  Surgeon: Leonie Man, MD;  Location: [place] [facility];  Service: Cardiovascular;  Laterality: N/A;  . CATARACT EXTRACTION W/PHACO Right 11/13/2012   Procedure: CATARACT EXTRACTION PHACO AND INTRAOCULAR LENS PLACEMENT (IOC);  Surgeon: Tonny Branch, MD;  Location: [facility];  Service: Ophthalmology;  Laterality: Right;  CDE:12.75  . CATARACT EXTRACTION W/PHACO Left 01/11/2013   Procedure: CATARACT EXTRACTION PHACO AND INTRAOCULAR LENS PLACEMENT (IOC);  Surgeon: Tonny Branch, MD;  Location: [facility];  Service: Ophthalmology;  Laterality: Left;  CDE: 21.13  . COLONOSCOPY  09/2013   Dr. Burke Keels: colitis descending colon and sigmoid colon.Bx ?Cdiff vs ischemic.  Marland Kitchen CORONARY ANGIOPLASTY WITH STENT PLACEMENT  07/01/2017  . CORONARY ARTERY BYPASS GRAFT  11/09/1999   LIMA to LAD, SVG to diagonal, SVG to OM1 and OM2, SVG to PDA  . CORONARY STENT INTERVENTION N/A 07/01/2017   Procedure: CORONARY STENT INTERVENTION;  Surgeon: Burnell Blanks,  MD;  Location: [place];  Service: Cardiovascular;  Laterality: N/A;  . ENTEROSCOPY N/A 12/26/2015   Procedure: ENTEROSCOPY;  Surgeon: Daneil Dolin, MD;  [facility];  Service: Endoscopy;  Laterality: N/A;  . ESOPHAGOGASTRODUODENOSCOPY N/A 05/20/2015   Procedure: ESOPHAGOGASTRODUODENOSCOPY (EGD);  Surgeon: Wilford Corner, MD;  Location: [facility];  Service: Endoscopy;  Laterality: N/A;  .  ESOPHAGOGASTRODUODENOSCOPY N/A 12/26/2015   Procedure: ESOPHAGOGASTRODUODENOSCOPY (EGD);  Surgeon: Daneil Dolin, MD;  [facility];  Service: Endoscopy;  Laterality: N/A;  . GIVENS CAPSULE STUDY N/A 12/24/2015   Procedure: GIVENS CAPSULE STUDY;  Surgeon: Rogene Houston, MD;  [facility];  Service: Endoscopy;  Laterality: N/A;  . LEFT HEART CATH AND CORS/GRAFTS ANGIOGRAPHY N/A 07/01/2017   Procedure: LEFT HEART CATH AND CORS/GRAFTS ANGIOGRAPHY;  Surgeon: Burnell Blanks, MD;  Location: [place] [facility];  Service: Cardiovascular;  Laterality: N/A;  . LEFT HEART CATHETERIZATION WITH CORONARY ANGIOGRAM N/A 04/22/2014   Procedure: LEFT HEART CATHETERIZATION WITH CORONARY ANGIOGRAM;  Surgeon: Burnell Blanks, MD;  Location: [facility] C[facility] LAB;  Service: Cardiovascular;  Laterality: N/A;  . PERCUTANEOUS CORONARY STENT INTERVENTION (PCI-S) N/A 04/25/2014   Procedure: PERCUTANEOUS CORONARY STENT INTERVENTION (PCI-S);  Surgeon: Peter M Martinique, MD;  Location: [facility] CATH LAB;  Service: Cardiovascular;  Laterality: N/A;  . PERIPHERAL VASCULAR CATHETERIZATION  04/23/2016   Procedure: Thrombectomy;  Surgeon: Leonie Man, MD;  Location: [place] [facility];  Service: Cardiovascular;;    Current Outpatient Medications  Medication Sig Dispense Refill  . aspirin 81 MG chewable tablet Chew 81 mg by mouth daily.    Marland Kitchen atorvastatin (LIPITOR) 10 MG tablet TAKE 1 TABLET BY MOUTH  DAILY 90 tablet 3  . clopidogrel (PLAVIX) 75 MG tablet TAKE 1 TABLET BY MOUTH  DAILY 90 tablet 1  . COLCRYS 0.6 MG tablet Take 1 tablet by mouth Once daily as needed (for gout).     . cyclobenzaprine (FLEXERIL) 10 MG tablet Take 1 tablet (10 mg total) by mouth 3 times/day as needed-between meals & bedtime for muscle spasms. (Patient taking differently: Take 10 mg by mouth every other day. ) 30 tablet 0  . docusate sodium (COLACE) 100 MG capsule Take 100 mg by mouth daily.    Marland Kitchen esomeprazole (NEXIUM) 20 MG  capsule Take 20 mg by mouth daily at 12 noon.    . ferrous sulfate 325 (65 FE) MG tablet Take 325 mg by mouth every other day.    . finasteride (PROSCAR) 5 MG tablet Take 5 mg by mouth daily.    . fluticasone (FLONASE) 50 MCG/ACT nasal spray Place 1 spray into both nostrils as needed for allergies or rhinitis.    Marland Kitchen isosorbide mononitrate (IMDUR) 60 MG 24 hr tablet TAKE ONE-HALF TABLET BY  MOUTH DAILY 45 tablet 3  . Magnesium 250 MG TABS Take 0.5 tablets by mouth daily.     . metoprolol succinate (TOPROL-XL) 25 MG 24 hr tablet TAKE 1 TABLET BY MOUTH  DAILY 90 tablet 3  . Multiple Vitamin (MULTIVITAMIN) tablet Take 1 tablet by mouth daily.    . nitroGLYCERIN (NITROSTAT) 0.4 MG SL tablet Place 1 tablet (0.4 mg total) under the tongue every 5 (five) minutes x 3 doses as needed. 75 tablet 3  . tamsulosin (FLOMAX) 0.4 MG CAPS capsule Take 0.4 mg by mouth at bedtime.     No current facility-administered medications for this visit.    Allergies:  Protonix [pantoprazole sodium]  Social History: The patient  reports that he quit smoking about 19 years ago. His smoking use included cigarettes. He started smoking about 72 years ago. He has a 25.00 pack-year smoking history. He has never used smokeless tobacco. He reports that he does not drink alcohol or use drugs.   ROS:  Please see the history of present illness. Otherwise, complete review of systems is positive for intermittent left knee pain and swelling.  All other systems are reviewed and negative.   Physical Exam: VS:  BP (!) 142/82   Pulse 87   Ht 5' 5.5" (1.664 m)   Wt 140 lb (63.5 kg)   SpO2 98%   BMI 22.94 kg/m , BMI Body mass index is 22.94 kg/m.  Wt Readings from Last 3 Encounters:  03/09/19 140 lb (63.5 kg)  07/27/18 143 lb 3.2 oz (65 kg)  12/07/17 139 lb 9.6 oz (63.3 kg)    General: Elderly male, appears comfortable at rest. HEENT: Conjunctiva and lids normal, wearing a mask Neck: Supple, no elevated JVP or carotid bruits, no  thyromegaly. Lungs: Clear to auscultation, nonlabored breathing at rest. Cardiac: Regular rate and rhythm, no S3 or significant systolic murmur, no pericardial rub. Abdomen: Soft, nontender, no hepatomegaly, bowel sounds present, no guarding or rebound. Extremities: No pitting edema, distal pulses 2+. Skin: Warm and dry. Musculoskeletal: No kyphosis. Neuropsychiatric: Alert and oriented x3, affect grossly appropriate.  ECG:  An ECG dated 07/27/2018 was personally reviewed today and demonstrated:  Sinus rhythm with IVCD and diffuse ST-T wave abnormalities.  Recent Labwork:  08/14/2017: ALT 26; AST 21; BUN 22; Creatinine, Ser 1.26; Hemoglobin 12.1; Platelets 226; Potassium 4.3; Sodium 140   Other Studies Reviewed Today:  Echocardiogram 08/10/2018:  1. The left ventricle has a visually estimated ejection fraction of of 45%. The inferior and inferolateral walls are akinetic.  2. The mitral valve is myxomatous. Mild thickening of the mitral valve leaflet. Mild calcification of the mitral valve leaflet. There is mild mitral annular calcification present. Mitral valve regurgitation is moderate by color flow Doppler.  3. The aortic valve is tricuspid Mild thickening of the aortic valve Mild calcification of the aortic valve. Aortic valve regurgitation is mild by color flow Doppler.  4. The aortic root is normal in size and structure.  5. The right ventricle has normal systolic function. The cavity was normal. There is no increase in right ventricular wall thickness.  6. The tricuspid valve is normal in structure.  7. Left atrial size was mildly dilated.  Cardiac catheterization 07/02/2017:  Ost LM to Mid LM lesion is 50% stenosed.  Ost LAD to Prox LAD lesion is 90% stenosed.  LIMA graft was visualized by angiography and is normal in caliber.  Ost Cx to Prox Cx lesion is 100% stenosed.  SVG graft was visualized by angiography and is normal in caliber.  Mid RCA lesion is 100% stenosed.  SVG  graft was visualized by angiography and is normal in caliber.  Dist Graft to Insertion lesion is 99% stenosed.  A drug-eluting stent was successfully placed using a STENT SYNERGY DES 3X16.  Post intervention, there is a 0% residual stenosis.  A drug-eluting stent was successfully placed.  Mid Graft lesion is 70% stenosed.  Post intervention, there is a 0% residual stenosis.  Prox Graft to Dist Graft lesion before 1st Mrg is 30% stenosed.  Mid Graft lesion between 1st Mrg and 2nd Mrg is 40% stenosed.  Mid Graft lesion is 40% stenosed.  Origin lesion is  30% stenosed.  There is mild to moderate left ventricular systolic dysfunction.  LV end diastolic pressure is normal.  The left ventricular ejection fraction is 35-45% by visual estimate.  1. Severe triple vessel CAD s/p 5 V CABG with 5/5 patent bypass grafts 2. The LAD has a high grade proximal stenosis, unchanged. The LIMA graft is patent to the mid LAD. The vein graft is patent to the Diagonal.  3. The Circumflex is occluded proximally. The vein graft to the OM1/OM2 is patent. The stents in this vein graft have mild restenosis.  4. The RCA is occluded in the mid segment. The vein graft to the RCA has a moderately severe stenosis in the mid body of the graft and the distal stented segment of the graft has 99% stenosis.  5. Moderate LV systolic function, LVEF estimated at 40%.  6. Successful PTCA/DES x 2 mid and distal body of SVG to RCA. No distal protection used as the distal lesion as within the distal stent and there was no good landing zone.   Recommendations: Continue DAPT with ASA and Plavix, continue beta blocker and statin.  Assessment and Plan:  1.  Multivessel CAD status post CABG with graft disease and most recently intervention to the SVG to RCA with DES x2 in December 2019.  Fortunately, he remains clinically stable without progressive angina symptoms on medical therapy.  No changes made to present regimen.  I  reviewed his ECG from July.  2.  Ischemic cardiomyopathy with LVEF 45% by follow-up echocardiogram.  3.  Moderate mitral regurgitation.  4.  Orthostatic dizziness, improved after discontinuation of Cozaar.  5.  Mixed hyperlipidemia, continues on Lipitor with follow-up by Dr. Pleas Koch.  Medication Adjustments/Labs and Tests Ordered: Current medicines are reviewed at length with the patient today.  Concerns regarding medicines are outlined above.   Tests Ordered: No orders of the defined types were placed in this encounter.   Medication Changes: No orders of the defined types were placed in this encounter.   Disposition:  Follow up 6 months in the [place] office.  Signed, Satira Sark, MD, [facility] 03/09/2019 2:16 PM    [facility] [place], [place], [place] [address] Phone: [phone]; Fax: [phone]

## 2019-03-12 ENCOUNTER — Telehealth: Payer: Self-pay | Admitting: Cardiology

## 2019-03-12 NOTE — Telephone Encounter (Signed)
Pt wife was questioning medication list from [facility] - after reviewing medications and what they were for pt wife confirmed medication list was correct and matched home medications. Appreciative of explanation

## 2019-03-12 NOTE — Telephone Encounter (Signed)
[name] ( wife) called requesting to speak with someone in regards to Mr. Beavers medication.

## 2019-03-16 DIAGNOSIS — I77811 Abdominal aortic ectasia: Secondary | ICD-10-CM | POA: Diagnosis not present

## 2019-03-16 DIAGNOSIS — I25812 Atherosclerosis of bypass graft of coronary artery of transplanted heart without angina pectoris: Secondary | ICD-10-CM | POA: Diagnosis not present

## 2019-03-16 DIAGNOSIS — Z6821 Body mass index (BMI) 21.0-21.9, adult: Secondary | ICD-10-CM | POA: Diagnosis not present

## 2019-03-16 DIAGNOSIS — Z23 Encounter for immunization: Secondary | ICD-10-CM | POA: Diagnosis not present

## 2019-03-16 DIAGNOSIS — D500 Iron deficiency anemia secondary to blood loss (chronic): Secondary | ICD-10-CM | POA: Diagnosis not present

## 2019-03-16 DIAGNOSIS — I10 Essential (primary) hypertension: Secondary | ICD-10-CM | POA: Diagnosis not present

## 2019-03-16 DIAGNOSIS — I255 Ischemic cardiomyopathy: Secondary | ICD-10-CM | POA: Diagnosis not present

## 2019-03-29 ENCOUNTER — Other Ambulatory Visit: Payer: Self-pay | Admitting: Cardiology

## 2019-04-03 ENCOUNTER — Ambulatory Visit: Payer: Medicare Other | Admitting: Cardiology

## 2019-04-05 ENCOUNTER — Other Ambulatory Visit: Payer: Self-pay | Admitting: Cardiology

## 2019-04-23 DIAGNOSIS — M109 Gout, unspecified: Secondary | ICD-10-CM | POA: Diagnosis not present

## 2019-04-23 DIAGNOSIS — Z6821 Body mass index (BMI) 21.0-21.9, adult: Secondary | ICD-10-CM | POA: Diagnosis not present

## 2019-05-07 DIAGNOSIS — E782 Mixed hyperlipidemia: Secondary | ICD-10-CM | POA: Diagnosis not present

## 2019-05-07 DIAGNOSIS — I10 Essential (primary) hypertension: Secondary | ICD-10-CM | POA: Diagnosis not present

## 2019-05-18 ENCOUNTER — Other Ambulatory Visit: Payer: Self-pay

## 2019-05-18 NOTE — Patient Outreach (Signed)
[facility]) Care Management  05/18/2019  Billy [name] 1935/01/21 [id number]   Medication Adherence call to Billy [name] [facility] Identifiers Verify spoke with patient wife patient is past due on Losartan 25 mg,wife explain patient is no longer taking this medication doctor took him off. Billy [name] is showing past due under [facility].  [facility] Management Direct Dial [phone]  Fax [phone] [email or web address]

## 2019-06-06 ENCOUNTER — Telehealth: Payer: Self-pay | Admitting: Cardiology

## 2019-06-06 NOTE — Telephone Encounter (Signed)
[name] (spouse) called wanting to check with Dr. Domenic Polite to see if it is ok for patient to take the COVID injection.

## 2019-06-07 DIAGNOSIS — J449 Chronic obstructive pulmonary disease, unspecified: Secondary | ICD-10-CM | POA: Diagnosis not present

## 2019-06-07 DIAGNOSIS — E782 Mixed hyperlipidemia: Secondary | ICD-10-CM | POA: Diagnosis not present

## 2019-06-11 NOTE — Telephone Encounter (Signed)
Pt wife aware that we do not know if/when we would administer COVID vaccine for pts - aware to discuss with pcp

## 2019-06-13 DIAGNOSIS — I10 Essential (primary) hypertension: Secondary | ICD-10-CM | POA: Diagnosis not present

## 2019-06-13 DIAGNOSIS — Z0001 Encounter for general adult medical examination with abnormal findings: Secondary | ICD-10-CM | POA: Diagnosis not present

## 2019-06-13 DIAGNOSIS — J449 Chronic obstructive pulmonary disease, unspecified: Secondary | ICD-10-CM | POA: Diagnosis not present

## 2019-06-13 DIAGNOSIS — I77811 Abdominal aortic ectasia: Secondary | ICD-10-CM | POA: Diagnosis not present

## 2019-06-13 DIAGNOSIS — I25812 Atherosclerosis of bypass graft of coronary artery of transplanted heart without angina pectoris: Secondary | ICD-10-CM | POA: Diagnosis not present

## 2019-06-13 DIAGNOSIS — D500 Iron deficiency anemia secondary to blood loss (chronic): Secondary | ICD-10-CM | POA: Diagnosis not present

## 2019-06-13 DIAGNOSIS — Z6823 Body mass index (BMI) 23.0-23.9, adult: Secondary | ICD-10-CM | POA: Diagnosis not present

## 2019-08-03 DIAGNOSIS — E7849 Other hyperlipidemia: Secondary | ICD-10-CM | POA: Diagnosis not present

## 2019-08-03 DIAGNOSIS — I10 Essential (primary) hypertension: Secondary | ICD-10-CM | POA: Diagnosis not present

## 2019-08-07 ENCOUNTER — Other Ambulatory Visit: Payer: Self-pay | Admitting: Cardiology

## 2019-09-05 DIAGNOSIS — E7849 Other hyperlipidemia: Secondary | ICD-10-CM | POA: Diagnosis not present

## 2019-09-05 DIAGNOSIS — I10 Essential (primary) hypertension: Secondary | ICD-10-CM | POA: Diagnosis not present

## 2019-09-26 DIAGNOSIS — Z85828 Personal history of other malignant neoplasm of skin: Secondary | ICD-10-CM | POA: Diagnosis not present

## 2019-09-26 DIAGNOSIS — Z08 Encounter for follow-up examination after completed treatment for malignant neoplasm: Secondary | ICD-10-CM | POA: Diagnosis not present

## 2019-09-26 DIAGNOSIS — X32XXXD Exposure to sunlight, subsequent encounter: Secondary | ICD-10-CM | POA: Diagnosis not present

## 2019-09-26 DIAGNOSIS — L570 Actinic keratosis: Secondary | ICD-10-CM | POA: Diagnosis not present

## 2019-09-26 DIAGNOSIS — L821 Other seborrheic keratosis: Secondary | ICD-10-CM | POA: Diagnosis not present

## 2019-10-05 ENCOUNTER — Encounter: Payer: Self-pay | Admitting: Cardiology

## 2019-10-05 ENCOUNTER — Ambulatory Visit (INDEPENDENT_AMBULATORY_CARE_PROVIDER_SITE_OTHER): Payer: Medicare Other | Admitting: Cardiology

## 2019-10-05 ENCOUNTER — Other Ambulatory Visit: Payer: Self-pay

## 2019-10-05 VITALS — BP 140/62 | HR 72 | Temp 98.7°F | Ht 67.0 in | Wt 142.0 lb

## 2019-10-05 DIAGNOSIS — I25812 Atherosclerosis of bypass graft of coronary artery of transplanted heart without angina pectoris: Secondary | ICD-10-CM | POA: Diagnosis not present

## 2019-10-05 DIAGNOSIS — I6523 Occlusion and stenosis of bilateral carotid arteries: Secondary | ICD-10-CM

## 2019-10-05 DIAGNOSIS — I25119 Atherosclerotic heart disease of native coronary artery with unspecified angina pectoris: Secondary | ICD-10-CM

## 2019-10-05 DIAGNOSIS — E782 Mixed hyperlipidemia: Secondary | ICD-10-CM | POA: Diagnosis not present

## 2019-10-05 DIAGNOSIS — M1A9XX1 Chronic gout, unspecified, with tophus (tophi): Secondary | ICD-10-CM | POA: Diagnosis not present

## 2019-10-05 NOTE — Patient Instructions (Signed)
Medication Instructions: Your physician recommends that you continue on your current medications as directed. Please refer to the Current Medication list given to you today.   Labwork: None today   Procedures/Testing: Your physician has requested that you have a carotid duplex. This test is an ultrasound of the carotid arteries in your neck. It looks at blood flow through these arteries that supply the brain with blood. Allow one hour for this exam. There are no restrictions or special instructions.    Follow-Up: 6 months office visit with Dr.McDowell in the [place] office  Any Additional Special Instructions Will Be Listed Below (If Applicable).     If you need a refill on your cardiac medications before your next appointment, please call your pharmacy.       Thank you for choosing [facility] !

## 2019-10-05 NOTE — Progress Notes (Signed)
Cardiology Office Note  Date: 10/05/2019   ID: Billy [name], DOB Jan 22, 1935, MRN [id number]  PCP:  Curlene Labrum, MD  Cardiologist:  Rozann Lesches, MD Electrophysiologist:  None   Chief Complaint  Patient presents with  . Cardiac follow-up    History of Present Illness: Billy [name] is an 85 y.o. male last seen in October 2020.  He presents for a routine visit.  He does not report any major change in angina symptoms or increasing nitroglycerin use on current medical regimen.  Still enjoys working on [employer] and doing outside work recently with the change in weather.  I reviewed his medications which are stable from a cardiac perspective and listed below.  He does not report any bleeding problems on dual antiplatelet therapy.  We did discuss obtaining follow-up carotid Dopplers.  Last carotid Dopplers in 2017 demonstrated mild bilateral ICA stenoses.  Past Medical History:  Diagnosis Date  . Arthritis   . Carotid artery disease ([place])    51-76% LICA and 1-60% RICA - December 2013  . Coronary atherosclerosis of native coronary artery    a. /p CABG in 2001 b. DESx2 to SVG-OM1 and staged DES to SVG-RCA in 04/2014 c. DES to SVG-PDA in 05/2015 d. DESx2 to SVG-OM1-OM2 in 04/2016 e. 06/2017: DESx2 to mid and distal body of SVG-RCA  . Essential hypertension   . GERD (gastroesophageal reflux disease)   . Gout   . History of blood transfusion 2017  . HOH (hard of hearing)   . Mixed hyperlipidemia   . Myocardial infarction ([place]) 2017  . Sinus bradycardia    Asymptomatic  . Skin cancer     Past Surgical History:  Procedure Laterality Date  . APPENDECTOMY     Ruptured  . CARDIAC CATHETERIZATION N/A 05/19/2015   Procedure: Left Heart Cath and Cors/Grafts Angiography;  Surgeon: Lorretta Harp, MD;  Location: [place] [facility];  Service: Cardiovascular;  Laterality: N/A;  . CARDIAC CATHETERIZATION N/A 05/19/2015   Procedure: Coronary Stent Intervention;  Surgeon:  Lorretta Harp, MD;  Location: [place] [facility];  Service: Cardiovascular;  Laterality: N/A;  . CARDIAC CATHETERIZATION N/A 04/23/2016   Procedure: LEFT HEART CATH AND CORS/GRAFTS ANGIOGRAPHY;  Surgeon: Leonie Man, MD;  Location: [place] [facility];  Service: Cardiovascular;  Laterality: N/A;  . CARDIAC CATHETERIZATION N/A 04/23/2016   Procedure: Coronary Stent Intervention;  Surgeon: Leonie Man, MD;  Location: [place] [facility];  Service: Cardiovascular;  Laterality: N/A;  . CATARACT EXTRACTION W/PHACO Right 11/13/2012   Procedure: CATARACT EXTRACTION PHACO AND INTRAOCULAR LENS PLACEMENT (IOC);  Surgeon: Tonny Branch, MD;  Location: [facility];  Service: Ophthalmology;  Laterality: Right;  CDE:12.75  . CATARACT EXTRACTION W/PHACO Left 01/11/2013   Procedure: CATARACT EXTRACTION PHACO AND INTRAOCULAR LENS PLACEMENT (IOC);  Surgeon: Tonny Branch, MD;  Location: [facility];  Service: Ophthalmology;  Laterality: Left;  CDE: 21.13  . COLONOSCOPY  09/2013   Dr. Burke Keels: colitis descending colon and sigmoid colon.Bx ?Cdiff vs ischemic.  Marland Kitchen CORONARY ANGIOPLASTY WITH STENT PLACEMENT  07/01/2017  . CORONARY ARTERY BYPASS GRAFT  11/09/1999   LIMA to LAD, SVG to diagonal, SVG to OM1 and OM2, SVG to PDA  . CORONARY STENT INTERVENTION N/A 07/01/2017   Procedure: CORONARY STENT INTERVENTION;  Surgeon: Burnell Blanks, MD;  Location: [place] [facility];  Service: Cardiovascular;  Laterality: N/A;  . ENTEROSCOPY N/A 12/26/2015   Procedure: ENTEROSCOPY;  Surgeon: Daneil Dolin, MD;  [facility];  Service: Endoscopy;  Laterality: N/A;  . ESOPHAGOGASTRODUODENOSCOPY N/A 05/20/2015   Procedure: ESOPHAGOGASTRODUODENOSCOPY (EGD);  Surgeon: Wilford Corner, MD;  Location[facility];  Service: Endoscopy;  Laterality: N/A;  . ESOPHAGOGASTRODUODENOSCOPY N/A 12/26/2015   Procedure: ESOPHAGOGASTRODUODENOSCOPY (EGD);  Surgeon: Daneil Dolin, MD;  [facility];  Service: Endoscopy;   Laterality: N/A;  . GIVENS CAPSULE STUDY N/A 12/24/2015   Procedure: GIVENS CAPSULE STUDY;  Surgeon: Rogene Houston, MD;  [facility];  Service: Endoscopy;  Laterality: N/A;  . LEFT HEART CATH AND CORS/GRAFTS ANGIOGRAPHY N/A 07/01/2017   Procedure: LEFT HEART CATH AND CORS/GRAFTS ANGIOGRAPHY;  Surgeon: Burnell Blanks, MD;  Location: [place] [facility];  Service: Cardiovascular;  Laterality: N/A;  . LEFT HEART CATHETERIZATION WITH CORONARY ANGIOGRAM N/A 04/22/2014   Procedure: LEFT HEART CATHETERIZATION WITH CORONARY ANGIOGRAM;  Surgeon: Burnell Blanks, MD;  Location: [facility] C[facility] LAB;  Service: Cardiovascular;  Laterality: N/A;  . PERCUTANEOUS CORONARY STENT INTERVENTION (PCI-S) N/A 04/25/2014   Procedure: PERCUTANEOUS CORONARY STENT INTERVENTION (PCI-S);  Surgeon: Peter M Martinique, MD;  Location: [facility] CATH LAB;  Service: Cardiovascular;  Laterality: N/A;  . PERIPHERAL VASCULAR CATHETERIZATION  04/23/2016   Procedure: Thrombectomy;  Surgeon: Leonie Man, MD;  Location: [place] [facility];  Service: Cardiovascular;;    Current Outpatient Medications  Medication Sig Dispense Refill  . aspirin 81 MG chewable tablet Chew 81 mg by mouth daily.    Marland Kitchen atorvastatin (LIPITOR) 10 MG tablet TAKE 1 TABLET BY MOUTH  DAILY 90 tablet 3  . clopidogrel (PLAVIX) 75 MG tablet TAKE 1 TABLET BY MOUTH  DAILY 90 tablet 3  . COLCRYS 0.6 MG tablet Take 1 tablet by mouth Once daily as needed (for gout).     . cyclobenzaprine (FLEXERIL) 10 MG tablet Take 1 tablet (10 mg total) by mouth 3 times/day as needed-between meals & bedtime for muscle spasms. (Patient taking differently: Take 10 mg by mouth every other day. ) 30 tablet 0  . docusate sodium (COLACE) 100 MG capsule Take 100 mg by mouth daily.    Marland Kitchen esomeprazole (NEXIUM) 20 MG capsule TAKE 1 CAPSULE BY MOUTH  DAILY AT NOON. 90 capsule 3  . ferrous sulfate 325 (65 FE) MG tablet Take 325 mg by mouth every other day.    . finasteride (PROSCAR) 5 MG  tablet Take 5 mg by mouth daily.    . fluticasone (FLONASE) 50 MCG/ACT nasal spray Place 1 spray into both nostrils as needed for allergies or rhinitis.    Marland Kitchen isosorbide mononitrate (IMDUR) 60 MG 24 hr tablet TAKE ONE-HALF TABLET BY  MOUTH DAILY 45 tablet 3  . Magnesium 250 MG TABS Take 0.5 tablets by mouth daily.     . metoprolol succinate (TOPROL-XL) 25 MG 24 hr tablet TAKE 1 TABLET BY MOUTH  DAILY 90 tablet 3  . Multiple Vitamin (MULTIVITAMIN) tablet Take 1 tablet by mouth daily.    . nitroGLYCERIN (NITROSTAT) 0.4 MG SL tablet Place 1 tablet (0.4 mg total) under the tongue every 5 (five) minutes x 3 doses as needed. 75 tablet 3  . tamsulosin (FLOMAX) 0.4 MG CAPS capsule Take 0.4 mg by mouth at bedtime.     No current facility-administered medications for this visit.   Allergies:  Protonix [pantoprazole sodium]   ROS:  Hearing loss.  Physical Exam: VS:  BP 140/62   Pulse 72   Temp 98.7 F (37.1 C)   Ht 5\' 7"  (1.702 m)  Wt 142 lb (64.4 kg)   SpO2 97%   BMI 22.24 kg/m , BMI Body mass index is 22.24 kg/m.  Wt Readings from Last 3 Encounters:  10/05/19 142 lb (64.4 kg)  03/09/19 140 lb (63.5 kg)  07/27/18 143 lb 3.2 oz (65 kg)    General: Elderly male, appears comfortable at rest. HEENT: Conjunctiva and lids normal, wearing a mask. Neck: Supple, no elevated JVP, right carotid bruit, no thyromegaly. Lungs: Clear to auscultation, nonlabored breathing at rest. Cardiac: Regular rate and rhythm, no S3, soft systolic murmur, no pericardial rub. Extremities: No pitting edema, distal pulses 2+. Skin: Warm and dry.  ECG:  An ECG dated 12/14/2018 was personally reviewed today and demonstrated:  Sinus rhythm with nonspecific ST-T changes.  Recent Labwork:  No interval lab work for review today.  Other Studies Reviewed Today:  Echocardiogram 08/10/2018: 1. The left ventricle has a visually estimated ejection fraction of of 45%. The inferior and inferolateral walls are akinetic. 2.  The mitral valve is myxomatous. Mild thickening of the mitral valve leaflet. Mild calcification of the mitral valve leaflet. There is mild mitral annular calcification present. Mitral valve regurgitation is moderate by color flow Doppler. 3. The aortic valve is tricuspid Mild thickening of the aortic valve Mild calcification of the aortic valve. Aortic valve regurgitation is mild by color flow Doppler. 4. The aortic root is normal in size and structure. 5. The right ventricle has normal systolic function. The cavity was normal. There is no increase in right ventricular wall thickness. 6. The tricuspid valve is normal in structure. 7. Left atrial size was mildly dilated.  Cardiac catheterization 07/02/2017:  Ost LM to Mid LM lesion is 50% stenosed.  Ost LAD to Prox LAD lesion is 90% stenosed.  LIMA graft was visualized by angiography and is normal in caliber.  Ost Cx to Prox Cx lesion is 100% stenosed.  SVG graft was visualized by angiography and is normal in caliber.  Mid RCA lesion is 100% stenosed.  SVG graft was visualized by angiography and is normal in caliber.  Dist Graft to Insertion lesion is 99% stenosed.  A drug-eluting stent was successfully placed using a STENT SYNERGY DES 3X16.  Post intervention, there is a 0% residual stenosis.  A drug-eluting stent was successfully placed.  Mid Graft lesion is 70% stenosed.  Post intervention, there is a 0% residual stenosis.  Prox Graft to Dist Graft lesion before 1st Mrg is 30% stenosed.  Mid Graft lesion between 1st Mrg and 2nd Mrg is 40% stenosed.  Mid Graft lesion is 40% stenosed.  Origin lesion is 30% stenosed.  There is mild to moderate left ventricular systolic dysfunction.  LV end diastolic pressure is normal.  The left ventricular ejection fraction is 35-45% by visual estimate.  1. Severe triple vessel CAD s/p 5 V CABG with 5/5 patent bypass grafts 2. The LAD has a high grade proximal stenosis,  unchanged. The LIMA graft is patent to the mid LAD. The vein graft is patent to the Diagonal.  3. The Circumflex is occluded proximally. The vein graft to the OM1/OM2 is patent. The stents in this vein graft have mild restenosis.  4. The RCA is occluded in the mid segment. The vein graft to the RCA has a moderately severe stenosis in the mid body of the graft and the distal stented segment of the graft has 99% stenosis.  5. Moderate LV systolic function, LVEF estimated at 40%.  6. Successful PTCA/DES x 2 mid and  distal body of SVG to RCA. No distal protection used as the distal lesion as within the distal stent and there was no good landing zone.   Recommendations: Continue DAPT with ASA and Plavix, continue beta blocker and statin.  Assessment and Plan:  1.  CAD status post CABG with graft disease and subsequent percutaneous coronary intervention, most recently DES x2 to the SVG to RCA in December 2019.  He remains on dual antiplatelet therapy and has been clinically stable without progressive angina.  Continue aspirin, Plavix, Lipitor, Imdur, Toprol-XL, and as needed nitroglycerin.  2.  Ischemic cardiomyopathy, LVEF approximately 45% by echocardiogram last year.  Shows no evidence of fluid overload on examination.  Continue with current regimen.  3.  Mild carotid artery disease as of 2017, right carotid bruit evident on examination.  Follow-up carotid Dopplers will be obtained.  Medication Adjustments/Labs and Tests Ordered: Current medicines are reviewed at length with the patient today.  Concerns regarding medicines are outlined above.   Tests Ordered: Orders Placed This Encounter  Procedures  . VAS US CAROTID    Medication Changes: No orders of the defined types were placed in this encounter.   Disposition:  Follow up 6 months in the [place] office.  Signed, Satira Sark, MD, [facility] 10/05/2019 3:02 PM    [facility] [address]  [place], [place] [address] Phone: [phone]; Fax: [phone]

## 2019-10-12 ENCOUNTER — Other Ambulatory Visit: Payer: Self-pay | Admitting: Cardiology

## 2019-10-22 ENCOUNTER — Ambulatory Visit (INDEPENDENT_AMBULATORY_CARE_PROVIDER_SITE_OTHER): Payer: Medicare Other

## 2019-10-22 ENCOUNTER — Other Ambulatory Visit: Payer: Self-pay

## 2019-10-22 DIAGNOSIS — I6523 Occlusion and stenosis of bilateral carotid arteries: Secondary | ICD-10-CM

## 2019-10-26 ENCOUNTER — Telehealth: Payer: Self-pay | Admitting: *Deleted

## 2019-10-26 NOTE — Telephone Encounter (Signed)
Patient's wife informed. Copy sent to PCP 

## 2019-10-26 NOTE — Telephone Encounter (Signed)
-----   Message from Satira Sark, MD sent at 10/23/2019  7:54 AM EDT ----- Results reviewed.  Mild atherosclerotic plaque within the carotid arteries, 1 to 39%.  Continue with current medications and follow-up plan.

## 2019-12-05 DIAGNOSIS — I10 Essential (primary) hypertension: Secondary | ICD-10-CM | POA: Diagnosis not present

## 2019-12-05 DIAGNOSIS — E7849 Other hyperlipidemia: Secondary | ICD-10-CM | POA: Diagnosis not present

## 2019-12-05 DIAGNOSIS — I25812 Atherosclerosis of bypass graft of coronary artery of transplanted heart without angina pectoris: Secondary | ICD-10-CM | POA: Diagnosis not present

## 2019-12-05 DIAGNOSIS — Z87891 Personal history of nicotine dependence: Secondary | ICD-10-CM | POA: Diagnosis not present

## 2019-12-10 DIAGNOSIS — I77811 Abdominal aortic ectasia: Secondary | ICD-10-CM | POA: Diagnosis not present

## 2019-12-10 DIAGNOSIS — D500 Iron deficiency anemia secondary to blood loss (chronic): Secondary | ICD-10-CM | POA: Diagnosis not present

## 2019-12-10 DIAGNOSIS — I10 Essential (primary) hypertension: Secondary | ICD-10-CM | POA: Diagnosis not present

## 2019-12-10 DIAGNOSIS — M1A0721 Idiopathic chronic gout, left ankle and foot, with tophus (tophi): Secondary | ICD-10-CM | POA: Diagnosis not present

## 2019-12-10 DIAGNOSIS — I25812 Atherosclerosis of bypass graft of coronary artery of transplanted heart without angina pectoris: Secondary | ICD-10-CM | POA: Diagnosis not present

## 2019-12-10 DIAGNOSIS — Z6823 Body mass index (BMI) 23.0-23.9, adult: Secondary | ICD-10-CM | POA: Diagnosis not present

## 2019-12-10 DIAGNOSIS — E782 Mixed hyperlipidemia: Secondary | ICD-10-CM | POA: Diagnosis not present

## 2020-02-05 DIAGNOSIS — Z7984 Long term (current) use of oral hypoglycemic drugs: Secondary | ICD-10-CM | POA: Diagnosis not present

## 2020-02-05 DIAGNOSIS — I10 Essential (primary) hypertension: Secondary | ICD-10-CM | POA: Diagnosis not present

## 2020-02-05 DIAGNOSIS — I25812 Atherosclerosis of bypass graft of coronary artery of transplanted heart without angina pectoris: Secondary | ICD-10-CM | POA: Diagnosis not present

## 2020-02-05 DIAGNOSIS — E7849 Other hyperlipidemia: Secondary | ICD-10-CM | POA: Diagnosis not present

## 2020-02-18 DIAGNOSIS — H43393 Other vitreous opacities, bilateral: Secondary | ICD-10-CM | POA: Diagnosis not present

## 2020-03-06 DIAGNOSIS — E7849 Other hyperlipidemia: Secondary | ICD-10-CM | POA: Diagnosis not present

## 2020-03-06 DIAGNOSIS — I25812 Atherosclerosis of bypass graft of coronary artery of transplanted heart without angina pectoris: Secondary | ICD-10-CM | POA: Diagnosis not present

## 2020-03-06 DIAGNOSIS — I10 Essential (primary) hypertension: Secondary | ICD-10-CM | POA: Diagnosis not present

## 2020-03-06 DIAGNOSIS — Z7984 Long term (current) use of oral hypoglycemic drugs: Secondary | ICD-10-CM | POA: Diagnosis not present

## 2020-03-28 ENCOUNTER — Other Ambulatory Visit: Payer: Self-pay | Admitting: Cardiology

## 2020-04-05 DIAGNOSIS — I25812 Atherosclerosis of bypass graft of coronary artery of transplanted heart without angina pectoris: Secondary | ICD-10-CM | POA: Diagnosis not present

## 2020-04-05 DIAGNOSIS — Z7984 Long term (current) use of oral hypoglycemic drugs: Secondary | ICD-10-CM | POA: Diagnosis not present

## 2020-04-05 DIAGNOSIS — I10 Essential (primary) hypertension: Secondary | ICD-10-CM | POA: Diagnosis not present

## 2020-04-05 DIAGNOSIS — E7849 Other hyperlipidemia: Secondary | ICD-10-CM | POA: Diagnosis not present

## 2020-04-10 ENCOUNTER — Ambulatory Visit (INDEPENDENT_AMBULATORY_CARE_PROVIDER_SITE_OTHER): Payer: Medicare Other | Admitting: Cardiology

## 2020-04-10 ENCOUNTER — Other Ambulatory Visit: Payer: Self-pay

## 2020-04-10 ENCOUNTER — Encounter: Payer: Self-pay | Admitting: *Deleted

## 2020-04-10 ENCOUNTER — Encounter: Payer: Self-pay | Admitting: Cardiology

## 2020-04-10 VITALS — BP 140/80 | HR 72 | Ht 67.0 in | Wt 140.6 lb

## 2020-04-10 DIAGNOSIS — I255 Ischemic cardiomyopathy: Secondary | ICD-10-CM

## 2020-04-10 DIAGNOSIS — I25119 Atherosclerotic heart disease of native coronary artery with unspecified angina pectoris: Secondary | ICD-10-CM | POA: Diagnosis not present

## 2020-04-10 DIAGNOSIS — I6523 Occlusion and stenosis of bilateral carotid arteries: Secondary | ICD-10-CM

## 2020-04-10 MED ORDER — NITROGLYCERIN 0.4 MG SL SUBL: 0.4000 mg | SUBLINGUAL_TABLET | SUBLINGUAL | 3 refills | Status: DC | PRN

## 2020-04-10 NOTE — Progress Notes (Signed)
Cardiology Office Note  Date: 04/10/2020   ID: Billy [name], DOB February 10, 1935, MRN [id number]  PCP:  Curlene Labrum, MD  Cardiologist:  Rozann Lesches, MD Electrophysiologist:  None   Chief Complaint  Patient presents with  . Cardiac follow-up    History of Present Illness: Billy [name] is an 85 y.o. male last seen in April.  He is here today with his wife for a follow-up visit.  He does not report any active angina or nitroglycerin use.  Still doing chores around the house without major limitation.  I reviewed his medications which are outlined below and stable from a cardiac perspective.  We discussed getting a refill for fresh bottle of nitroglycerin.  Also requesting interval lab work from Dr. Pleas Koch.  I personally reviewed his ECG today which shows normal sinus rhythm with PVCs, right bundle branch block, left anterior fascicular block.  Past Medical History:  Diagnosis Date  . Arthritis   . Carotid artery disease ([place])    15-40% LICA and 0-86% RICA - December 2013  . Coronary atherosclerosis of native coronary artery    a. /p CABG in 2001 b. DESx2 to SVG-OM1 and staged DES to SVG-RCA in 04/2014 c. DES to SVG-PDA in 05/2015 d. DESx2 to SVG-OM1-OM2 in 04/2016 e. 06/2017: DESx2 to mid and distal body of SVG-RCA  . Essential hypertension   . GERD (gastroesophageal reflux disease)   . Gout   . History of blood transfusion 2017  . HOH (hard of hearing)   . Mixed hyperlipidemia   . Myocardial infarction ([place]) 2017  . Sinus bradycardia    Asymptomatic  . Skin cancer     Past Surgical History:  Procedure Laterality Date  . APPENDECTOMY     Ruptured  . CARDIAC CATHETERIZATION N/A 05/19/2015   Procedure: Left Heart Cath and Cors/Grafts Angiography;  Surgeon: Lorretta Harp, MD;  Location: [facility];  Service: Cardiovascular;  Laterality: N/A;  . CARDIAC CATHETERIZATION N/A 05/19/2015   Procedure: Coronary Stent Intervention;  Surgeon: Lorretta Harp, MD;   Location: [facility];  Service: Cardiovascular;  Laterality: N/A;  . CARDIAC CATHETERIZATION N/A 04/23/2016   Procedure: LEFT HEART CATH AND CORS/GRAFTS ANGIOGRAPHY;  Surgeon: Leonie Man, MD;  Location: [facility] [facility];  Service: Cardiovascular;  Laterality: N/A;  . CARDIAC CATHETERIZATION N/A 04/23/2016   Procedure: Coronary Stent Intervention;  Surgeon: Leonie Man, MD;  Location: [place] [facility];  Service: Cardiovascular;  Laterality: N/A;  . CATARACT EXTRACTION W/PHACO Right 11/13/2012   Procedure: CATARACT EXTRACTION PHACO AND INTRAOCULAR LENS PLACEMENT (IOC);  Surgeon: Tonny Branch, MD;  Location: [facility];  Service: Ophthalmology;  Laterality: Right;  CDE:12.75  . CATARACT EXTRACTION W/PHACO Left 01/11/2013   Procedure: CATARACT EXTRACTION PHACO AND INTRAOCULAR LENS PLACEMENT (IOC);  Surgeon: Tonny Branch, MD;  Location: [facility];  Service: Ophthalmology;  Laterality: Left;  CDE: 21.13  . COLONOSCOPY  09/2013   Dr. Burke Keels: colitis descending colon and sigmoid colon.Bx ?Cdiff vs ischemic.  Marland Kitchen CORONARY ANGIOPLASTY WITH STENT PLACEMENT  07/01/2017  . CORONARY ARTERY BYPASS GRAFT  11/09/1999   LIMA to LAD, SVG to diagonal, SVG to OM1 and OM2, SVG to PDA  . CORONARY STENT INTERVENTION N/A 07/01/2017   Procedure: CORONARY STENT INTERVENTION;  Surgeon: Burnell Blanks, MD;  Location: [facility];  Service: Cardiovascular;  Laterality: N/A;  . ENTEROSCOPY N/A 12/26/2015   Procedure: ENTEROSCOPY;  Surgeon: Daneil Dolin, MD;  [facility];  Service: Endoscopy;  Laterality: N/A;  . ESOPHAGOGASTRODUODENOSCOPY N/A 05/20/2015   Procedure: ESOPHAGOGASTRODUODENOSCOPY (EGD);  Surgeon: Wilford Corner, MD;  Location: [facility];  Service: Endoscopy;  Laterality: N/A;  . ESOPHAGOGASTRODUODENOSCOPY N/A 12/26/2015   Procedure: ESOPHAGOGASTRODUODENOSCOPY (EGD);  Surgeon: Daneil Dolin, MD;  [facility];  Service: Endoscopy;  Laterality: N/A;  . GIVENS  CAPSULE STUDY N/A 12/24/2015   Procedure: GIVENS CAPSULE STUDY;  Surgeon: Rogene Houston, MD;  [facility];  Service: Endoscopy;  Laterality: N/A;  . LEFT HEART CATH AND CORS/GRAFTS ANGIOGRAPHY N/A 07/01/2017   Procedure: LEFT HEART CATH AND CORS/GRAFTS ANGIOGRAPHY;  Surgeon: Burnell Blanks, MD;  Location: [place] [facility];  Service: Cardiovascular;  Laterality: N/A;  . LEFT HEART CATHETERIZATION WITH CORONARY ANGIOGRAM N/A 04/22/2014   Procedure: LEFT HEART CATHETERIZATION WITH CORONARY ANGIOGRAM;  Surgeon: Burnell Blanks, MD;  Location: [facility] C[facility] LAB;  Service: Cardiovascular;  Laterality: N/A;  . PERCUTANEOUS CORONARY STENT INTERVENTION (PCI-S) N/A 04/25/2014   Procedure: PERCUTANEOUS CORONARY STENT INTERVENTION (PCI-S);  Surgeon: Peter M Martinique, MD;  Location: [facility] CATH LAB;  Service: Cardiovascular;  Laterality: N/A;  . PERIPHERAL VASCULAR CATHETERIZATION  04/23/2016   Procedure: Thrombectomy;  Surgeon: Leonie Man, MD;  Location: [place] [facility];  Service: Cardiovascular;;    Current Outpatient Medications  Medication Sig Dispense Refill  . aspirin 81 MG chewable tablet Chew 81 mg by mouth daily.    Marland Kitchen atorvastatin (LIPITOR) 10 MG tablet TAKE 1 TABLET BY MOUTH  DAILY 90 tablet 3  . clopidogrel (PLAVIX) 75 MG tablet TAKE 1 TABLET BY MOUTH  DAILY 90 tablet 3  . COLCRYS 0.6 MG tablet Take 1 tablet by mouth Once daily as needed (for gout).     . cyclobenzaprine (FLEXERIL) 10 MG tablet Take 1 tablet (10 mg total) by mouth 3 times/day as needed-between meals & bedtime for muscle spasms. (Patient taking differently: Take 10 mg by mouth every other day. ) 30 tablet 0  . docusate sodium (COLACE) 100 MG capsule Take 100 mg by mouth daily.    Marland Kitchen esomeprazole (NEXIUM) 20 MG capsule TAKE 1 CAPSULE BY MOUTH  DAILY AT NOON. 90 capsule 3  . ferrous sulfate 325 (65 FE) MG tablet Take 325 mg by mouth every other day.    . finasteride (PROSCAR) 5 MG tablet Take 5 mg by mouth  daily.    . fluticasone (FLONASE) 50 MCG/ACT nasal spray Place 1 spray into both nostrils as needed for allergies or rhinitis.    Marland Kitchen isosorbide mononitrate (IMDUR) 60 MG 24 hr tablet TAKE ONE-HALF TABLET BY  MOUTH DAILY 45 tablet 3  . Magnesium 250 MG TABS Take 0.5 tablets by mouth daily.     . metoprolol succinate (TOPROL-XL) 25 MG 24 hr tablet TAKE 1 TABLET BY MOUTH  DAILY 90 tablet 3  . Multiple Vitamin (MULTIVITAMIN) tablet Take 1 tablet by mouth daily.    . nitroGLYCERIN (NITROSTAT) 0.4 MG SL tablet Place 1 tablet (0.4 mg total) under the tongue every 5 (five) minutes x 3 doses as needed. 25 tablet 3  . tamsulosin (FLOMAX) 0.4 MG CAPS capsule Take 0.4 mg by mouth at bedtime.     No current facility-administered medications for this visit.   Allergies:  Protonix [pantoprazole sodium]   ROS: No palpitations or syncope.  Physical Exam: VS:  BP 140/80   Pulse 72   Ht 5\' 7"  (1.702 m)   Wt 140 lb 9.6  oz (63.8 kg)   SpO2 97%   BMI 22.02 kg/m , BMI Body mass index is 22.02 kg/m.  Wt Readings from Last 3 Encounters:  04/10/20 140 lb 9.6 oz (63.8 kg)  10/05/19 142 lb (64.4 kg)  03/09/19 140 lb (63.5 kg)    General: Patient appears comfortable at rest. HEENT: Conjunctiva and lids normal, wearing a mask. Neck: Supple, no elevated JVP or carotid bruits, no thyromegaly. Lungs: Clear to auscultation, nonlabored breathing at rest. Cardiac: Regular rate and rhythm, no S3, soft systolic murmur, no pericardial rub.Marland Kitchen Extremities: No pitting edema, distal pulses 2+.  ECG:  An ECG dated 12/14/2018 was personally reviewed today and demonstrated:  Sinus rhythm with nonspecific ST-T changes.  Recent Labwork:  No interval lab work for review today.  Other Studies Reviewed Today:  Echocardiogram 08/10/2018: 1. The left ventricle has a visually estimated ejection fraction of of 45%. The inferior and inferolateral walls are akinetic. 2. The mitral valve is myxomatous. Mild thickening of the  mitral valve leaflet. Mild calcification of the mitral valve leaflet. There is mild mitral annular calcification present. Mitral valve regurgitation is moderate by color flow Doppler. 3. The aortic valve is tricuspid Mild thickening of the aortic valve Mild calcification of the aortic valve. Aortic valve regurgitation is mild by color flow Doppler. 4. The aortic root is normal in size and structure. 5. The right ventricle has normal systolic function. The cavity was normal. There is no increase in right ventricular wall thickness. 6. The tricuspid valve is normal in structure. 7. Left atrial size was mildly dilated.  Cardiac catheterization 07/02/2017:  Ost LM to Mid LM lesion is 50% stenosed.  Ost LAD to Prox LAD lesion is 90% stenosed.  LIMA graft was visualized by angiography and is normal in caliber.  Ost Cx to Prox Cx lesion is 100% stenosed.  SVG graft was visualized by angiography and is normal in caliber.  Mid RCA lesion is 100% stenosed.  SVG graft was visualized by angiography and is normal in caliber.  Dist Graft to Insertion lesion is 99% stenosed.  A drug-eluting stent was successfully placed using a STENT SYNERGY DES 3X16.  Post intervention, there is a 0% residual stenosis.  A drug-eluting stent was successfully placed.  Mid Graft lesion is 70% stenosed.  Post intervention, there is a 0% residual stenosis.  Prox Graft to Dist Graft lesion before 1st Mrg is 30% stenosed.  Mid Graft lesion between 1st Mrg and 2nd Mrg is 40% stenosed.  Mid Graft lesion is 40% stenosed.  Origin lesion is 30% stenosed.  There is mild to moderate left ventricular systolic dysfunction.  LV end diastolic pressure is normal.  The left ventricular ejection fraction is 35-45% by visual estimate.  1. Severe triple vessel CAD s/p 5 V CABG with 5/5 patent bypass grafts 2. The LAD has a high grade proximal stenosis, unchanged. The LIMA graft is patent to the mid LAD. The vein  graft is patent to the Diagonal.  3. The Circumflex is occluded proximally. The vein graft to the OM1/OM2 is patent. The stents in this vein graft have mild restenosis.  4. The RCA is occluded in the mid segment. The vein graft to the RCA has a moderately severe stenosis in the mid body of the graft and the distal stented segment of the graft has 99% stenosis.  5. Moderate LV systolic function, LVEF estimated at 40%.  6. Successful PTCA/DES x 2 mid and distal body of SVG to RCA. No  distal protection used as the distal lesion as within the distal stent and there was no good landing zone.   Recommendations: Continue DAPT with ASA and Plavix, continue beta blocker and statin.  Carotid Dopplers 10/22/2019: Summary:  Right Carotid: Velocities in the right ICA are consistent with a 1-39%  stenosis.         The ECA appears >50% stenosed.   Left Carotid: Velocities in the left ICA are consistent with a 1-39%  stenosis.        The ECA appears >50% stenosed.   Vertebrals: Left vertebral artery demonstrates antegrade flow. Right  vertebral        artery is antegrade with an atypical waveform.  Subclavians: Normal flow hemodynamics were seen in bilateral subclavian        arteries. There is a >20 mm Hg difference in systolic blood        pressure in arms.   Assessment and Plan:  1.  CAD status post CABG with graft disease and subsequent PCI most recently including DES x2 to the SVG to RCA in December 2019. He remained stable on medical therapy without angina symptoms.  Refill provided for fresh bottle of nitroglycerin.  ECG reviewed.  Continue aspirin, Plavix, Lipitor, Imdur, and Toprol-XL.  2.  Mild, asymptomatic carotid artery disease with recent Dopplers in May.  Continue antiplatelet regimen and statin.  3.  Ischemic cardiomyopathy with LVEF 45%.  No evidence of fluid overload.  Medication Adjustments/Labs and Tests Ordered: Current medicines are  reviewed at length with the patient today.  Concerns regarding medicines are outlined above.   Tests Ordered: Orders Placed This Encounter  Procedures  . EKG 12-Lead    Medication Changes: Meds ordered this encounter  Medications  . nitroGLYCERIN (NITROSTAT) 0.4 MG SL tablet    Sig: Place 1 tablet (0.4 mg total) under the tongue every 5 (five) minutes x 3 doses as needed.    Dispense:  25 tablet    Refill:  3    Disposition:  Follow up 6 months in the [place] office.  Signed, Satira Sark, MD, [facility] 04/10/2020 3:53 PM    [facility] [place], [place],  [address] Phone: [phone]; Fax: [phone]

## 2020-04-10 NOTE — Patient Instructions (Signed)

## 2020-05-06 DIAGNOSIS — E7849 Other hyperlipidemia: Secondary | ICD-10-CM | POA: Diagnosis not present

## 2020-05-06 DIAGNOSIS — I10 Essential (primary) hypertension: Secondary | ICD-10-CM | POA: Diagnosis not present

## 2020-05-06 DIAGNOSIS — I25812 Atherosclerosis of bypass graft of coronary artery of transplanted heart without angina pectoris: Secondary | ICD-10-CM | POA: Diagnosis not present

## 2020-05-18 ENCOUNTER — Other Ambulatory Visit: Payer: Self-pay | Admitting: Cardiology

## 2020-05-26 DIAGNOSIS — H26493 Other secondary cataract, bilateral: Secondary | ICD-10-CM | POA: Diagnosis not present

## 2020-05-26 DIAGNOSIS — H01002 Unspecified blepharitis right lower eyelid: Secondary | ICD-10-CM | POA: Diagnosis not present

## 2020-05-26 DIAGNOSIS — H01001 Unspecified blepharitis right upper eyelid: Secondary | ICD-10-CM | POA: Diagnosis not present

## 2020-05-26 DIAGNOSIS — H01004 Unspecified blepharitis left upper eyelid: Secondary | ICD-10-CM | POA: Diagnosis not present

## 2020-06-06 DIAGNOSIS — Z7984 Long term (current) use of oral hypoglycemic drugs: Secondary | ICD-10-CM | POA: Diagnosis not present

## 2020-06-06 DIAGNOSIS — E7849 Other hyperlipidemia: Secondary | ICD-10-CM | POA: Diagnosis not present

## 2020-06-06 DIAGNOSIS — I25812 Atherosclerosis of bypass graft of coronary artery of transplanted heart without angina pectoris: Secondary | ICD-10-CM | POA: Diagnosis not present

## 2020-06-06 DIAGNOSIS — I10 Essential (primary) hypertension: Secondary | ICD-10-CM | POA: Diagnosis not present

## 2020-06-12 ENCOUNTER — Other Ambulatory Visit: Payer: Self-pay | Admitting: Cardiology

## 2020-06-12 DIAGNOSIS — Z961 Presence of intraocular lens: Secondary | ICD-10-CM | POA: Diagnosis not present

## 2020-07-05 DIAGNOSIS — I10 Essential (primary) hypertension: Secondary | ICD-10-CM | POA: Diagnosis not present

## 2020-07-05 DIAGNOSIS — E7849 Other hyperlipidemia: Secondary | ICD-10-CM | POA: Diagnosis not present

## 2020-07-05 DIAGNOSIS — Z7984 Long term (current) use of oral hypoglycemic drugs: Secondary | ICD-10-CM | POA: Diagnosis not present

## 2020-07-05 DIAGNOSIS — I25812 Atherosclerosis of bypass graft of coronary artery of transplanted heart without angina pectoris: Secondary | ICD-10-CM | POA: Diagnosis not present

## 2020-07-07 ENCOUNTER — Telehealth: Payer: Self-pay | Admitting: Cardiology

## 2020-07-07 DIAGNOSIS — I10 Essential (primary) hypertension: Secondary | ICD-10-CM | POA: Diagnosis not present

## 2020-07-07 DIAGNOSIS — Z0001 Encounter for general adult medical examination with abnormal findings: Secondary | ICD-10-CM | POA: Diagnosis not present

## 2020-07-07 DIAGNOSIS — D649 Anemia, unspecified: Secondary | ICD-10-CM | POA: Diagnosis not present

## 2020-07-07 DIAGNOSIS — E7849 Other hyperlipidemia: Secondary | ICD-10-CM | POA: Diagnosis not present

## 2020-07-07 DIAGNOSIS — I77811 Abdominal aortic ectasia: Secondary | ICD-10-CM | POA: Diagnosis not present

## 2020-07-07 DIAGNOSIS — Z1389 Encounter for screening for other disorder: Secondary | ICD-10-CM | POA: Diagnosis not present

## 2020-07-07 DIAGNOSIS — D500 Iron deficiency anemia secondary to blood loss (chronic): Secondary | ICD-10-CM | POA: Diagnosis not present

## 2020-07-07 DIAGNOSIS — I25812 Atherosclerosis of bypass graft of coronary artery of transplanted heart without angina pectoris: Secondary | ICD-10-CM | POA: Diagnosis not present

## 2020-07-07 NOTE — Telephone Encounter (Signed)
I am not entirely sure that I understand the question.  I reviewed the ECG sent over which is actually similar to the last one we have from November 2021.  Right bundle branch block is not new.  He also has a left anterior fascicular block and PVCs which were both seen on the last tracing.  Unless he is having acute symptoms, would anticipate continued medical therapy and follow-up.

## 2020-07-07 NOTE — Telephone Encounter (Signed)
I spoke with Dr.Burdine and read Dr.McDowells note.He is satisfied. Patient has follow up with Dr.McDowell 10/03/20    Wife informed of f/u apt in [place] on 4/29 at 140 pm

## 2020-07-07 NOTE — Telephone Encounter (Signed)
EKG has been scanned, I will FYI Dr.McDowell

## 2020-07-07 NOTE — Telephone Encounter (Signed)
New message    [facility] called they are gonna fax over a new EKG on patient that is show RBBB and would like to know how Dr Domenic Polite would like to proceed

## 2020-08-03 ENCOUNTER — Other Ambulatory Visit: Payer: Self-pay | Admitting: Cardiology

## 2020-08-04 DIAGNOSIS — E7849 Other hyperlipidemia: Secondary | ICD-10-CM | POA: Diagnosis not present

## 2020-08-04 DIAGNOSIS — I25812 Atherosclerosis of bypass graft of coronary artery of transplanted heart without angina pectoris: Secondary | ICD-10-CM | POA: Diagnosis not present

## 2020-08-04 DIAGNOSIS — Z7984 Long term (current) use of oral hypoglycemic drugs: Secondary | ICD-10-CM | POA: Diagnosis not present

## 2020-08-04 DIAGNOSIS — I10 Essential (primary) hypertension: Secondary | ICD-10-CM | POA: Diagnosis not present

## 2020-09-03 DIAGNOSIS — I10 Essential (primary) hypertension: Secondary | ICD-10-CM | POA: Diagnosis not present

## 2020-09-03 DIAGNOSIS — Z7984 Long term (current) use of oral hypoglycemic drugs: Secondary | ICD-10-CM | POA: Diagnosis not present

## 2020-09-03 DIAGNOSIS — E7849 Other hyperlipidemia: Secondary | ICD-10-CM | POA: Diagnosis not present

## 2020-09-03 DIAGNOSIS — I25812 Atherosclerosis of bypass graft of coronary artery of transplanted heart without angina pectoris: Secondary | ICD-10-CM | POA: Diagnosis not present

## 2020-10-01 DIAGNOSIS — J449 Chronic obstructive pulmonary disease, unspecified: Secondary | ICD-10-CM | POA: Diagnosis not present

## 2020-10-01 DIAGNOSIS — R5383 Other fatigue: Secondary | ICD-10-CM | POA: Diagnosis not present

## 2020-10-01 DIAGNOSIS — E781 Pure hyperglyceridemia: Secondary | ICD-10-CM | POA: Diagnosis not present

## 2020-10-01 DIAGNOSIS — I10 Essential (primary) hypertension: Secondary | ICD-10-CM | POA: Diagnosis not present

## 2020-10-01 DIAGNOSIS — D649 Anemia, unspecified: Secondary | ICD-10-CM | POA: Diagnosis not present

## 2020-10-01 DIAGNOSIS — D500 Iron deficiency anemia secondary to blood loss (chronic): Secondary | ICD-10-CM | POA: Diagnosis not present

## 2020-10-02 NOTE — Progress Notes (Signed)
Cardiology Office Note  Date: 10/03/2020   ID: Billy [name], DOB [birth year], MRN [id number]  PCP:  Curlene Labrum, MD  Cardiologist:  Rozann Lesches, MD Electrophysiologist:  None   Chief Complaint  Patient presents with  . Cardiac follow-up    History of Present Illness: Billy [name] is an 86 y.o. male last seen in November 2021.  He is here today with his wife for a follow-up visit.  He has been planting a garden this past week, states that he feels well overall, stable dyspnea on exertion, but no angina symptoms or nitroglycerin use.  He has had no palpitations, sudden dizziness, or syncope.  He continues to follow with Dr. Pleas Koch, reports having recent blood work drawn for physical.  I reviewed his medications which are outlined below, he reports compliance with therapy, no spontaneous bleeding problems.  Past Medical History:  Diagnosis Date  . Arthritis   . Carotid artery disease ([place])    58-85% LICA and 0-27% RICA - December 2013  . Coronary atherosclerosis of native coronary artery    a. /p CABG in 2001 b. DESx2 to SVG-OM1 and staged DES to SVG-RCA in 04/2014 c. DES to SVG-PDA in 05/2015 d. DESx2 to SVG-OM1-OM2 in 04/2016 e. 06/2017: DESx2 to mid and distal body of SVG-RCA  . Essential hypertension   . GERD (gastroesophageal reflux disease)   . Gout   . History of blood transfusion 2017  . HOH (hard of hearing)   . Mixed hyperlipidemia   . Myocardial infarction ([place]) 2017  . Sinus bradycardia    Asymptomatic  . Skin cancer     Past Surgical History:  Procedure Laterality Date  . APPENDECTOMY     Ruptured  . CARDIAC CATHETERIZATION N/A 05/19/2015   Procedure: Left Heart Cath and Cors/Grafts Angiography;  Surgeon: Lorretta Harp, MD;  Location: [facility];  Service: Cardiovascular;  Laterality: N/A;  . CARDIAC CATHETERIZATION N/A 05/19/2015   Procedure: Coronary Stent Intervention;  Surgeon: Lorretta Harp, MD;  Location: [facility];   Service: Cardiovascular;  Laterality: N/A;  . CARDIAC CATHETERIZATION N/A 04/23/2016   Procedure: LEFT HEART CATH AND CORS/GRAFTS ANGIOGRAPHY;  Surgeon: Leonie Man, MD;  Location: [facility];  Service: Cardiovascular;  Laterality: N/A;  . CARDIAC CATHETERIZATION N/A 04/23/2016   Procedure: Coronary Stent Intervention;  Surgeon: Leonie Man, MD;  Location: [facility];  Service: Cardiovascular;  Laterality: N/A;  . CATARACT EXTRACTION W/PHACO Right 11/13/2012   Procedure: CATARACT EXTRACTION PHACO AND INTRAOCULAR LENS PLACEMENT (IOC);  Surgeon: Tonny Branch, MD;  Location: [facility];  Service: Ophthalmology;  Laterality: Right;  CDE:12.75  . CATARACT EXTRACTION W/PHACO Left 01/11/2013   Procedure: CATARACT EXTRACTION PHACO AND INTRAOCULAR LENS PLACEMENT (IOC);  Surgeon: Tonny Branch, MD;  Location: [facility];  Service: Ophthalmology;  Laterality: Left;  CDE: 21.13  . COLONOSCOPY  09/2013   Dr. Burke Keels: colitis descending colon and sigmoid colon.Bx ?Cdiff vs ischemic.  Marland Kitchen CORONARY ANGIOPLASTY WITH STENT PLACEMENT  07/01/2017  . CORONARY ARTERY BYPASS GRAFT  11/09/1999   LIMA to LAD, SVG to diagonal, SVG to OM1 and OM2, SVG to PDA  . CORONARY STENT INTERVENTION N/A 07/01/2017   Procedure: CORONARY STENT INTERVENTION;  Surgeon: Burnell Blanks, MD;  Location: [facility];  Service: Cardiovascular;  Laterality: N/A;  . ENTEROSCOPY N/A 12/26/2015   Procedure: ENTEROSCOPY;  Surgeon: Daneil Dolin, MD;  [facility];  Service: Endoscopy;  Laterality: N/A;  . ESOPHAGOGASTRODUODENOSCOPY N/A 05/20/2015   Procedure: ESOPHAGOGASTRODUODENOSCOPY (EGD);  Surgeon: Wilford Corner, MD;  Location: [facility];  Service: Endoscopy;  Laterality: N/A;  . ESOPHAGOGASTRODUODENOSCOPY N/A 12/26/2015   Procedure: ESOPHAGOGASTRODUODENOSCOPY (EGD);  Surgeon: Daneil Dolin, MD;  [facility];  Service: Endoscopy;  Laterality: N/A;  . GIVENS CAPSULE STUDY N/A 12/24/2015    Procedure: GIVENS CAPSULE STUDY;  Surgeon: Rogene Houston, MD;  [facility];  Service: Endoscopy;  Laterality: N/A;  . LEFT HEART CATH AND CORS/GRAFTS ANGIOGRAPHY N/A 07/01/2017   Procedure: LEFT HEART CATH AND CORS/GRAFTS ANGIOGRAPHY;  Surgeon: Burnell Blanks, MD;  Location: [facility];  Service: Cardiovascular;  Laterality: N/A;  . LEFT HEART CATHETERIZATION WITH CORONARY ANGIOGRAM N/A 04/22/2014   Procedure: LEFT HEART CATHETERIZATION WITH CORONARY ANGIOGRAM;  Surgeon: Burnell Blanks, MD;  Location: [facility];  Service: Cardiovascular;  Laterality: N/A;  . PERCUTANEOUS CORONARY STENT INTERVENTION (PCI-S) N/A 04/25/2014   Procedure: PERCUTANEOUS CORONARY STENT INTERVENTION (PCI-S);  Surgeon: Peter M Martinique, MD;  Location: [facility] C[facility] LAB;  Service: Cardiovascular;  Laterality: N/A;  . PERIPHERAL VASCULAR CATHETERIZATION  04/23/2016   Procedure: Thrombectomy;  Surgeon: Leonie Man, MD;  Location: [facility];  Service: Cardiovascular;;    Current Outpatient Medications  Medication Sig Dispense Refill  . aspirin 81 MG chewable tablet Chew 81 mg by mouth daily.    Marland Kitchen atorvastatin (LIPITOR) 10 MG tablet TAKE 1 TABLET BY MOUTH  DAILY 90 tablet 3  . clopidogrel (PLAVIX) 75 MG tablet TAKE 1 TABLET BY MOUTH  DAILY 90 tablet 3  . COLCRYS 0.6 MG tablet Take 1 tablet by mouth Once daily as needed (for gout).     . cyclobenzaprine (FLEXERIL) 10 MG tablet Take 1 tablet (10 mg total) by mouth 3 times/day as needed-between meals & bedtime for muscle spasms. (Patient taking differently: Take 10 mg by mouth every other day.) 30 tablet 0  . docusate sodium (COLACE) 100 MG capsule Take 100 mg by mouth daily.    Marland Kitchen esomeprazole (NEXIUM) 20 MG capsule TAKE 1 CAPSULE BY MOUTH  DAILY AT NOON. 90 capsule 3  . finasteride (PROSCAR) 5 MG tablet Take 5 mg by mouth daily.    . fluticasone (FLONASE) 50 MCG/ACT nasal spray Place 1 spray into both nostrils as needed for allergies  or rhinitis.    Marland Kitchen isosorbide mononitrate (IMDUR) 60 MG 24 hr tablet TAKE ONE-HALF TABLET BY  MOUTH DAILY 45 tablet 3  . Magnesium 250 MG TABS Take 0.5 tablets by mouth daily.     . metoprolol succinate (TOPROL-XL) 25 MG 24 hr tablet TAKE 1 TABLET BY MOUTH  DAILY 90 tablet 3  . nitroGLYCERIN (NITROSTAT) 0.4 MG SL tablet DISSOLVE 1 TABLET UNDER THE TONGUE EVERY 5 MINUTES AS  NEEDED FOR CHEST PAIN. MAX  OF 3 TABLETS IN 15 MINUTES. CALL 911 IF PAIN PERSISTS. 100 tablet 3  . tamsulosin (FLOMAX) 0.4 MG CAPS capsule Take 0.4 mg by mouth at bedtime.     No current facility-administered medications for this visit.   Allergies:  Protonix [pantoprazole sodium]   ROS: No orthopnea or PND.  Physical Exam: VS:  BP 128/76   Pulse 68   Ht 5\' 7"  (1.702 m)   Wt 140 lb (63.5 kg)   SpO2 97%   BMI 21.93 kg/m , BMI Body mass index is 21.93 kg/m.  Wt Readings from Last 3 Encounters:  10/03/20 140 lb (  63.5 kg)  04/10/20 140 lb 9.6 oz (63.8 kg)  10/05/19 142 lb (64.4 kg)    General: Patient appears comfortable at rest. HEENT: Conjunctiva and lids normal, wearing a mask. Neck: Supple, no elevated JVP or carotid bruits, no thyromegaly. Lungs: Clear to auscultation, nonlabored breathing at rest. Cardiac: Regular rate and rhythm, no S3, soft systolic murmur, no pericardial rub. Extremities: No pitting edema.  ECG:  An ECG dated January 2022 was personally reviewed today and demonstrated:  Sinus rhythm with right bundle branch block, left anterior fascicular block, frequent PVCs with burst of NSVT.  Recent Labwork:  July 2021: BUN 28, creatinine 1.38, potassium 5.5, AST 23, ALT 34, hemoglobin 13.3, platelets 276, TSH 4.1, cholesterol 149, triglycerides 211, HDL 26, LDL 87  Other Studies Reviewed Today:  Echocardiogram 08/10/2018: 1. The left ventricle has a visually estimated ejection fraction of of 45%. The inferior and inferolateral walls are akinetic. 2. The mitral valve is myxomatous. Mild  thickening of the mitral valve leaflet. Mild calcification of the mitral valve leaflet. There is mild mitral annular calcification present. Mitral valve regurgitation is moderate by color flow Doppler. 3. The aortic valve is tricuspid Mild thickening of the aortic valve Mild calcification of the aortic valve. Aortic valve regurgitation is mild by color flow Doppler. 4. The aortic root is normal in size and structure. 5. The right ventricle has normal systolic function. The cavity was normal. There is no increase in right ventricular wall thickness. 6. The tricuspid valve is normal in structure. 7. Left atrial size was mildly dilated.  Cardiac catheterization 07/02/2017:  Ost LM to Mid LM lesion is 50% stenosed.  Ost LAD to Prox LAD lesion is 90% stenosed.  LIMA graft was visualized by angiography and is normal in caliber.  Ost Cx to Prox Cx lesion is 100% stenosed.  SVG graft was visualized by angiography and is normal in caliber.  Mid RCA lesion is 100% stenosed.  SVG graft was visualized by angiography and is normal in caliber.  Dist Graft to Insertion lesion is 99% stenosed.  A drug-eluting stent was successfully placed using a STENT SYNERGY DES 3X16.  Post intervention, there is a 0% residual stenosis.  A drug-eluting stent was successfully placed.  Mid Graft lesion is 70% stenosed.  Post intervention, there is a 0% residual stenosis.  Prox Graft to Dist Graft lesion before 1st Mrg is 30% stenosed.  Mid Graft lesion between 1st Mrg and 2nd Mrg is 40% stenosed.  Mid Graft lesion is 40% stenosed.  Origin lesion is 30% stenosed.  There is mild to moderate left ventricular systolic dysfunction.  LV end diastolic pressure is normal.  The left ventricular ejection fraction is 35-45% by visual estimate.  1. Severe triple vessel CAD s/p 5 V CABG with 5/5 patent bypass grafts 2. The LAD has a high grade proximal stenosis, unchanged. The LIMA graft is patent to the  mid LAD. The vein graft is patent to the Diagonal.  3. The Circumflex is occluded proximally. The vein graft to the OM1/OM2 is patent. The stents in this vein graft have mild restenosis.  4. The RCA is occluded in the mid segment. The vein graft to the RCA has a moderately severe stenosis in the mid body of the graft and the distal stented segment of the graft has 99% stenosis.  5. Moderate LV systolic function, LVEF estimated at 40%.  6. Successful PTCA/DES x 2 mid and distal body of SVG to RCA. No distal protection used  as the distal lesion as within the distal stent and there was no good landing zone.   Recommendations: Continue DAPT with ASA and Plavix, continue beta blocker and statin.  Carotid Dopplers 10/22/2019: Summary:  Right Carotid: Velocities in the right ICA are consistent with a 1-39%  stenosis.         The ECA appears >50% stenosed.   Left Carotid: Velocities in the left ICA are consistent with a 1-39%  stenosis.        The ECA appears >50% stenosed.   Vertebrals: Left vertebral artery demonstrates antegrade flow. Right  vertebral        artery is antegrade with an atypical waveform.  Subclavians: Normal flow hemodynamics were seen in bilateral subclavian        arteries. There is a >20 mm Hg difference in systolic blood        pressure in arms.   Assessment and Plan:  1.  Multivessel CAD status post CABG with subsequently documented graft disease and PCI including DES x2 to the SVG to RCA in December 2019.  He does not describe any progressive angina symptoms and tolerating medical therapy at this point.  Continue aspirin, Plavix, Lipitor, Toprol-XL, Imdur, and as needed nitroglycerin.  2.  Ischemic cardiomyopathy, LVEF 45%.  Reports NYHA class II dyspnea, weight has been stable without evidence of fluid overload.  Continue with current regimen.  Medication Adjustments/Labs and Tests Ordered: Current medicines are reviewed at  length with the patient today.  Concerns regarding medicines are outlined above.   Tests Ordered: No orders of the defined types were placed in this encounter.   Medication Changes: No orders of the defined types were placed in this encounter.   Disposition:  Follow up 6 months.  Signed, Satira Sark, MD, [facility] 10/03/2020 2:13 PM    [facility] [address] [place],  [address] Phone: [phone]; Fax: [phone]

## 2020-10-03 ENCOUNTER — Ambulatory Visit (INDEPENDENT_AMBULATORY_CARE_PROVIDER_SITE_OTHER): Payer: Medicare Other | Admitting: Cardiology

## 2020-10-03 ENCOUNTER — Encounter: Payer: Self-pay | Admitting: Cardiology

## 2020-10-03 ENCOUNTER — Other Ambulatory Visit: Payer: Self-pay

## 2020-10-03 VITALS — BP 128/76 | HR 68 | Ht 67.0 in | Wt 140.0 lb

## 2020-10-03 DIAGNOSIS — I255 Ischemic cardiomyopathy: Secondary | ICD-10-CM

## 2020-10-03 DIAGNOSIS — I25119 Atherosclerotic heart disease of native coronary artery with unspecified angina pectoris: Secondary | ICD-10-CM | POA: Diagnosis not present

## 2020-10-03 NOTE — Patient Instructions (Signed)

## 2020-10-13 DIAGNOSIS — I77811 Abdominal aortic ectasia: Secondary | ICD-10-CM | POA: Diagnosis not present

## 2020-10-13 DIAGNOSIS — Z955 Presence of coronary angioplasty implant and graft: Secondary | ICD-10-CM | POA: Diagnosis not present

## 2020-10-13 DIAGNOSIS — E7849 Other hyperlipidemia: Secondary | ICD-10-CM | POA: Diagnosis not present

## 2020-10-13 DIAGNOSIS — I255 Ischemic cardiomyopathy: Secondary | ICD-10-CM | POA: Diagnosis not present

## 2020-10-13 DIAGNOSIS — I25812 Atherosclerosis of bypass graft of coronary artery of transplanted heart without angina pectoris: Secondary | ICD-10-CM | POA: Diagnosis not present

## 2020-10-13 DIAGNOSIS — D500 Iron deficiency anemia secondary to blood loss (chronic): Secondary | ICD-10-CM | POA: Diagnosis not present

## 2020-10-13 DIAGNOSIS — I10 Essential (primary) hypertension: Secondary | ICD-10-CM | POA: Diagnosis not present

## 2020-10-13 DIAGNOSIS — M1A0721 Idiopathic chronic gout, left ankle and foot, with tophus (tophi): Secondary | ICD-10-CM | POA: Diagnosis not present

## 2020-10-19 ENCOUNTER — Other Ambulatory Visit: Payer: Self-pay

## 2020-10-19 ENCOUNTER — Encounter (HOSPITAL_COMMUNITY): Payer: Self-pay | Admitting: *Deleted

## 2020-10-19 ENCOUNTER — Emergency Department (HOSPITAL_COMMUNITY): Payer: Medicare Other

## 2020-10-19 ENCOUNTER — Emergency Department (HOSPITAL_COMMUNITY)
Admission: EM | Admit: 2020-10-19 | Discharge: 2020-10-19 | Discharge status: Against Medical Advice | Payer: Medicare Other | Attending: Emergency Medicine | Admitting: Emergency Medicine

## 2020-10-19 DIAGNOSIS — Z87891 Personal history of nicotine dependence: Secondary | ICD-10-CM | POA: Diagnosis not present

## 2020-10-19 DIAGNOSIS — Z7982 Long term (current) use of aspirin: Secondary | ICD-10-CM | POA: Insufficient documentation

## 2020-10-19 DIAGNOSIS — Z951 Presence of aortocoronary bypass graft: Secondary | ICD-10-CM | POA: Diagnosis not present

## 2020-10-19 DIAGNOSIS — Z79899 Other long term (current) drug therapy: Secondary | ICD-10-CM | POA: Diagnosis not present

## 2020-10-19 DIAGNOSIS — Y93H2 Activity, gardening and landscaping: Secondary | ICD-10-CM | POA: Insufficient documentation

## 2020-10-19 DIAGNOSIS — I1310 Hypertensive heart and chronic kidney disease without heart failure, with stage 1 through stage 4 chronic kidney disease, or unspecified chronic kidney disease: Secondary | ICD-10-CM | POA: Insufficient documentation

## 2020-10-19 DIAGNOSIS — W010XXA Fall on same level from slipping, tripping and stumbling without subsequent striking against object, initial encounter: Secondary | ICD-10-CM | POA: Insufficient documentation

## 2020-10-19 DIAGNOSIS — S6991XA Unspecified injury of right wrist, hand and finger(s), initial encounter: Secondary | ICD-10-CM | POA: Insufficient documentation

## 2020-10-19 DIAGNOSIS — Z7902 Long term (current) use of antithrombotics/antiplatelets: Secondary | ICD-10-CM | POA: Diagnosis not present

## 2020-10-19 DIAGNOSIS — S4991XA Unspecified injury of right shoulder and upper arm, initial encounter: Secondary | ICD-10-CM | POA: Insufficient documentation

## 2020-10-19 DIAGNOSIS — N183 Chronic kidney disease, stage 3 unspecified: Secondary | ICD-10-CM | POA: Diagnosis not present

## 2020-10-19 DIAGNOSIS — M25521 Pain in right elbow: Secondary | ICD-10-CM | POA: Insufficient documentation

## 2020-10-19 DIAGNOSIS — M79644 Pain in right finger(s): Secondary | ICD-10-CM | POA: Diagnosis not present

## 2020-10-19 DIAGNOSIS — W19XXXA Unspecified fall, initial encounter: Secondary | ICD-10-CM

## 2020-10-19 DIAGNOSIS — M19041 Primary osteoarthritis, right hand: Secondary | ICD-10-CM | POA: Diagnosis not present

## 2020-10-19 DIAGNOSIS — I2510 Atherosclerotic heart disease of native coronary artery without angina pectoris: Secondary | ICD-10-CM | POA: Insufficient documentation

## 2020-10-19 NOTE — ED Notes (Signed)
Left without notifying anyone or d/c papers

## 2020-10-19 NOTE — ED Provider Notes (Signed)
[facility] EMERGENCY DEPARTMENT Provider Note   CSN: [id number] Arrival date & time: 10/19/20  1716     History Chief Complaint  Patient presents with  . Arm Injury    Right arm and right index finger    Billy [name] is a 86 y.o. male.  HPI   Patient with significant medical history of CAD, CABG in 2001, gout, presents to the emergency department with complaints of second right digit pain and right elbow pain.  Patient states his right second digit pain started on Monday, he was working on [employer] and one of them was falling and he went to grab it and hit his PIP joint against a piece of wood, states he has been having pain in that joint since then.  He has difficulty bending due to the pain, denies paresthesias in his finger, denies alleviating factors.  Patient also endorses that he hurt his right elbow on Friday he was out in the garden and had a mechanical fall, landing onto his right side, he states he did hit his head, but denies losing conscious, is not on anticoagulant, denies paresthesia or weakness the upper or lower extremities, he denies neck, back  or chest pain.  Patient states elbow pain pain is worsened with movement, describes it as a throbbing sensation, denies paresthesia or weakness in the arm.  Denies alleviating factors.  Patient denies headaches, fevers, chills, shortness of breath, chest pain, abdominal pain, nausea, vomiting, diarrhea, or pedal edema.  Past Medical History:  Diagnosis Date  . Arthritis   . Carotid artery disease ([place])    96-75% LICA and 9-16% RICA - December 2013  . Coronary atherosclerosis of native coronary artery    a. /p CABG in 2001 b. DESx2 to SVG-OM1 and staged DES to SVG-RCA in 04/2014 c. DES to SVG-PDA in 05/2015 d. DESx2 to SVG-OM1-OM2 in 04/2016 e. 06/2017: DESx2 to mid and distal body of SVG-RCA  . Essential hypertension   . GERD (gastroesophageal reflux disease)   . Gout   . History of blood transfusion 2017  . HOH (hard of  hearing)   . Mixed hyperlipidemia   . Myocardial infarction ([place]) 2017  . Sinus bradycardia    Asymptomatic  . Skin cancer     Patient Active Problem List   Diagnosis Date Noted  . Unstable angina ([place]) 07/01/2017  . Coronary stent thrombosis   . Chest pain at rest 04/22/2016  . Syncope and collapse   . Mucosal abnormality of duodenum   . Blood loss anemia   . Coronary artery disease involving coronary bypass graft of native heart without angina pectoris   . IDA (iron deficiency anemia)   . Syncope 12/23/2015  . Symptomatic anemia 12/23/2015  . NSTEMI (non-ST elevated myocardial infarction) ([place])   . Angina at rest [facility]) 05/18/2015  . Abdominal pain, epigastric 04/14/2015  . Left sided abdominal pain 04/14/2015  . Melena 02/27/2015  . CAD (coronary artery disease) of artery bypass graft 05/17/2014  . Prediabetes 04/23/2014  . CKD (chronic kidney disease) stage 3, GFR 30-59 ml/min (HCC) 04/23/2014  . Anemia 04/23/2014  . HOH (hard of hearing)   . Bradycardia 04/19/2012  . Carotid artery disease without cerebral infarction ([place]) 04/07/2011  . Mixed hyperlipidemia 03/05/2009  . Essential hypertension 03/05/2009    Past Surgical History:  Procedure Laterality Date  . APPENDECTOMY     Ruptured  . CARDIAC CATHETERIZATION N/A 05/19/2015   Procedure: Left Heart Cath and Cors/Grafts Angiography;  Surgeon:  Lorretta Harp, MD;  Location: [place] CV LAB;  Service: Cardiovascular;  Laterality: N/A;  . CARDIAC CATHETERIZATION N/A 05/19/2015   Procedure: Coronary Stent Intervention;  Surgeon: Lorretta Harp, MD;  Location: [place] CV LAB;  Service: Cardiovascular;  Laterality: N/A;  . CARDIAC CATHETERIZATION N/A 04/23/2016   Procedure: LEFT HEART CATH AND CORS/GRAFTS ANGIOGRAPHY;  Surgeon: Leonie Man, MD;  Location: [place] CV LAB;  Service: Cardiovascular;  Laterality: N/A;  . CARDIAC CATHETERIZATION N/A 04/23/2016   Procedure: Coronary Stent Intervention;   Surgeon: Leonie Man, MD;  Location: [place] CV LAB;  Service: Cardiovascular;  Laterality: N/A;  . CATARACT EXTRACTION W/PHACO Right 11/13/2012   Procedure: CATARACT EXTRACTION PHACO AND INTRAOCULAR LENS PLACEMENT (IOC);  Surgeon: Tonny Branch, MD;  Location: [facility] ORS;  Service: Ophthalmology;  Laterality: Right;  CDE:12.75  . CATARACT EXTRACTION W/PHACO Left 01/11/2013   Procedure: CATARACT EXTRACTION PHACO AND INTRAOCULAR LENS PLACEMENT (IOC);  Surgeon: Tonny Branch, MD;  Location: [facility] ORS;  Service: Ophthalmology;  Laterality: Left;  CDE: 21.13  . COLONOSCOPY  09/2013   Dr. [place] Keels: colitis descending colon and sigmoid colon.Bx ?Cdiff vs ischemic.  Marland Kitchen CORONARY ANGIOPLASTY WITH STENT PLACEMENT  07/01/2017  . CORONARY ARTERY BYPASS GRAFT  11/09/1999   LIMA to LAD, SVG to diagonal, SVG to OM1 and OM2, SVG to PDA  . CORONARY STENT INTERVENTION N/A 07/01/2017   Procedure: CORONARY STENT INTERVENTION;  Surgeon: Burnell Blanks, MD;  Location: [place] CV LAB;  Service: Cardiovascular;  Laterality: N/A;  . ENTEROSCOPY N/A 12/26/2015   Procedure: ENTEROSCOPY;  Surgeon: Daneil Dolin, MD;  [facility];  Service: Endoscopy;  Laterality: N/A;  . ESOPHAGOGASTRODUODENOSCOPY N/A 05/20/2015   Procedure: ESOPHAGOGASTRODUODENOSCOPY (EGD);  Surgeon: Wilford Corner, MD;  Location: [facility] ENDOSCOPY;  Service: Endoscopy;  Laterality: N/A;  . ESOPHAGOGASTRODUODENOSCOPY N/A 12/26/2015   Procedure: ESOPHAGOGASTRODUODENOSCOPY (EGD);  Surgeon: Daneil Dolin, MD;  [facility];  Service: Endoscopy;  Laterality: N/A;  . GIVENS CAPSULE STUDY N/A 12/24/2015   Procedure: GIVENS CAPSULE STUDY;  Surgeon: Rogene Houston, MD;  [facility];  Service: Endoscopy;  Laterality: N/A;  . LEFT HEART CATH AND CORS/GRAFTS ANGIOGRAPHY N/A 07/01/2017   Procedure: LEFT HEART CATH AND CORS/GRAFTS ANGIOGRAPHY;  Surgeon: Burnell Blanks, MD;  Location: [place] CV LAB;  Service:  Cardiovascular;  Laterality: N/A;  . LEFT HEART CATHETERIZATION WITH CORONARY ANGIOGRAM N/A 04/22/2014   Procedure: LEFT HEART CATHETERIZATION WITH CORONARY ANGIOGRAM;  Surgeon: Burnell Blanks, MD;  Location: [facility] CATH LAB;  Service: Cardiovascular;  Laterality: N/A;  . PERCUTANEOUS CORONARY STENT INTERVENTION (PCI-S) N/A 04/25/2014   Procedure: PERCUTANEOUS CORONARY STENT INTERVENTION (PCI-S);  Surgeon: Peter M Martinique, MD;  Location: [facility] CATH LAB;  Service: Cardiovascular;  Laterality: N/A;  . PERIPHERAL VASCULAR CATHETERIZATION  04/23/2016   Procedure: Thrombectomy;  Surgeon: Leonie Man, MD;  Location: [place] CV LAB;  Service: Cardiovascular;;       Family History  Problem Relation Age of Onset  . Cancer Father        colon, age 27s    Social History   Tobacco Use  . Smoking status: Former Smoker    Packs/day: 0.50    Years: 50.00    Pack years: 25.00    Types: Cigarettes    Start date: 01/08/1947    Quit date: 06/08/1999    Years since quitting: 21.3  . Smokeless tobacco: Never Used  Vaping Use  .  Vaping Use: Never used  Substance Use Topics  . Alcohol use: No    Alcohol/week: 0.0 standard drinks    Comment: 07/01/2017 "used to drink beer; quit in 2001"  . Drug use: No    Home Medications Prior to Admission medications   Medication Sig Start Date End Date Taking? Authorizing Provider  aspirin 81 MG chewable tablet Chew 81 mg by mouth daily.    [provider]  atorvastatin (LIPITOR) 10 MG tablet TAKE 1 TABLET BY MOUTH  DAILY 08/04/20   Satira Sark, MD  clopidogrel (PLAVIX) 75 MG tablet TAKE 1 TABLET BY MOUTH  DAILY 03/28/20   Satira Sark, MD  COLCRYS 0.6 MG tablet Take 1 tablet by mouth Once daily as needed (for gout).  03/05/12   [provider]  cyclobenzaprine (FLEXERIL) 10 MG tablet Take 1 tablet (10 mg total) by mouth 3 times/day as needed-between meals & bedtime for muscle spasms. Patient taking differently: Take 10 mg by  mouth every other day. 04/24/16   Laqueta Linden, MD  docusate sodium (COLACE) 100 MG capsule Take 100 mg by mouth daily.    [provider]  esomeprazole (NEXIUM) 20 MG capsule TAKE 1 CAPSULE BY MOUTH  DAILY AT NOON. 08/07/19   Satira Sark, MD  finasteride (PROSCAR) 5 MG tablet Take 5 mg by mouth daily.    [provider]  fluticasone (FLONASE) 50 MCG/ACT nasal spray Place 1 spray into both nostrils as needed for allergies or rhinitis.    [provider]  isosorbide mononitrate (IMDUR) 60 MG 24 hr tablet TAKE ONE-HALF TABLET BY  MOUTH DAILY 08/04/20   Satira Sark, MD  Magnesium 250 MG TABS Take 0.5 tablets by mouth daily.     [provider]  metoprolol succinate (TOPROL-XL) 25 MG 24 hr tablet TAKE 1 TABLET BY MOUTH  DAILY 05/19/20   Satira Sark, MD  nitroGLYCERIN (NITROSTAT) 0.4 MG SL tablet DISSOLVE 1 TABLET UNDER THE TONGUE EVERY 5 MINUTES AS  NEEDED FOR CHEST PAIN. MAX  OF 3 TABLETS IN 15 MINUTES. CALL 911 IF PAIN PERSISTS. 06/13/20   Satira Sark, MD  tamsulosin (FLOMAX) 0.4 MG CAPS capsule Take 0.4 mg by mouth at bedtime.    [provider]    Allergies    Protonix [pantoprazole sodium]  Review of Systems   Review of Systems  Constitutional: Negative for chills and fever.  HENT: Negative for congestion.   Respiratory: Negative for shortness of breath.   Cardiovascular: Negative for chest pain.  Gastrointestinal: Negative for abdominal pain.  Genitourinary: Negative for enuresis.  Musculoskeletal: Negative for back pain.       Right second digit, right elbow pain  Skin: Negative for rash.  Neurological: Negative for dizziness.  Hematological: Does not bruise/bleed easily.    Physical Exam Updated Vital Signs BP (!) 167/85   Pulse 72   Temp 98.2 F (36.8 C) (Oral)   Resp 16   Ht 5\' 7"  (1.702 m)   Wt 63.5 kg   SpO2 95%   BMI 21.93 kg/m   Physical Exam Vitals and nursing note reviewed.   Constitutional:      General: He is not in acute distress.    Appearance: He is not ill-appearing.  HENT:     Head: Normocephalic and atraumatic.     Comments: Head was palpated was nontender to palpation, no deformities present.  No raccoon eyes or battle sign    Nose: No congestion.  Eyes:     Extraocular Movements: Extraocular movements intact.     Conjunctiva/sclera: Conjunctivae normal.  Cardiovascular:     Rate and Rhythm: Normal rate and regular rhythm.     Pulses: Normal pulses.     Heart sounds: No murmur heard. No friction rub. No gallop.   Pulmonary:     Effort: No respiratory distress.     Breath sounds: No wheezing, rhonchi or rales.  Musculoskeletal:        General: Tenderness present.     Comments: Patient's right hand was visualized his second digit was slightly swollen at the PIP joint, he had full range of motion at at the DIP joint, decreased range of motion at the PIP joint, full range of motion of the MCP joint, he was slightly tender to palpation, no deformities or crepitus present, neurovascularly intact.    Patient's right elbow was visualized he had slight edema on the medial aspect of the epicondyle, he was slightly tender to palpation, no deformities present, he has full range of motion.  Spine was palpated was nontender to palpation, no step-off or deformities present.  Chest was palpated was nontender to palpation.  Skin:    General: Skin is warm and dry.  Neurological:     Mental Status: He is alert.     GCS: GCS eye subscore is 4. GCS verbal subscore is 5. GCS motor subscore is 6.     Comments: Current nerves II through XII grossly intact,  Patient have no difficulty word finding.  Psychiatric:        Mood and Affect: Mood normal.     ED Results / Procedures / Treatments   Labs (all labs ordered are listed, but only abnormal results are displayed) Labs Reviewed - No data to display  EKG None  Radiology No results  found.  Procedures Procedures   Medications Ordered in ED Medications - No data to display  ED Course  I have reviewed the triage vital signs and the nursing notes.  Pertinent labs & imaging results that were available during my care of the patient were reviewed by me and considered in my medical decision making (see chart for details).    MDM Rules/Calculators/A&P                         Initial impression-patient presents with right second digit pain of the hand as well as right elbow pain.  He is alert, does not appear in acute distress, vital signs reassuring.  Will obtain imaging and reassess.  Work-up-imaging is pending at this time.  Rule out-low suspicion for intracranial head bleed and fracture of the skull as head was palpated it is nontender to palpation, there is no focal deficits present on exam.  Low suspicion for spinal cord abnormality or spinal fracture as spine was palpated was nontender to palpation, patient is moving all extremities without difficulty.  Low suspicion for rib fracture or pneumothorax as ribs are nontender to palpation, lung sounds are clear bilaterally.  Low suspicion for orthopedic injury as there is no gross deformities present my exam, patient still has full range of motion in his second right digit as well as his right elbow.  Plan-  Due to shift change patient will be handed off to coutri couture PAC HPI, current work-up, likely disposition was provided.  Suspect patient suffering from a muscular strain, will recommend over-the-counter pain medications, follow-up with orthopedic surgery as long as imaging is  reassuring.     Final Clinical Impression(s) / ED Diagnoses Final diagnoses:  Fall, initial encounter    Rx / DC Orders ED Discharge Orders    None       Aron Baba 10/19/20 1915    Fredia Sorrow, MD 10/22/20 [phone]

## 2020-10-19 NOTE — ED Triage Notes (Signed)
Pt states he fell while gardening x 2 days ago after losing his balance; pt has pain and limited mobility to right arm

## 2020-10-19 NOTE — Discharge Instructions (Signed)
Your x-rays did not show any evidence of any fractures or dislocation.  You can use over-the-counter pain medications to help with your symptoms.  Please follow-up with your regular doctor in 1 week for reevaluation return to the emergency department for any new or worsening symptoms.

## 2020-10-19 NOTE — ED Provider Notes (Addendum)
Care assumed from Deno Etienne, PA-C at shift change pending results of x-ray.  Briefly, patient had a fall few days ago and is complaining of pain to the right upper extremity.  X-rays are negative for any evidence of fracture or dislocation.    Went to recheck pt and he was not in room. He appears to have eloped prior to receiving results   Rodney Booze, PA-C 10/19/20 [address] 10/19/20 2013    Fredia Sorrow, MD 10/22/20 [phone]

## 2020-10-19 NOTE — ED Notes (Signed)
Called into room. Pt and family upset that xr has not been resulted, was told previously "it will only take a few minutes." Stated if it will no result soon then they will leave .

## 2020-10-20 DIAGNOSIS — M25421 Effusion, right elbow: Secondary | ICD-10-CM | POA: Diagnosis not present

## 2020-10-20 DIAGNOSIS — M7989 Other specified soft tissue disorders: Secondary | ICD-10-CM | POA: Diagnosis not present

## 2020-10-20 DIAGNOSIS — Z6822 Body mass index (BMI) 22.0-22.9, adult: Secondary | ICD-10-CM | POA: Diagnosis not present

## 2020-10-28 DIAGNOSIS — M7989 Other specified soft tissue disorders: Secondary | ICD-10-CM | POA: Diagnosis not present

## 2020-10-28 DIAGNOSIS — Z6822 Body mass index (BMI) 22.0-22.9, adult: Secondary | ICD-10-CM | POA: Diagnosis not present

## 2020-10-28 DIAGNOSIS — M25421 Effusion, right elbow: Secondary | ICD-10-CM | POA: Diagnosis not present

## 2020-11-03 DIAGNOSIS — I10 Essential (primary) hypertension: Secondary | ICD-10-CM | POA: Diagnosis not present

## 2020-11-03 DIAGNOSIS — Z7984 Long term (current) use of oral hypoglycemic drugs: Secondary | ICD-10-CM | POA: Diagnosis not present

## 2020-11-03 DIAGNOSIS — I25812 Atherosclerosis of bypass graft of coronary artery of transplanted heart without angina pectoris: Secondary | ICD-10-CM | POA: Diagnosis not present

## 2020-11-03 DIAGNOSIS — E7849 Other hyperlipidemia: Secondary | ICD-10-CM | POA: Diagnosis not present

## 2020-11-04 ENCOUNTER — Telehealth: Payer: Self-pay | Admitting: *Deleted

## 2020-11-04 MED ORDER — ATORVASTATIN CALCIUM 20 MG PO TABS: 20.0000 mg | ORAL_TABLET | Freq: Every day | ORAL | 3 refills | Status: DC

## 2020-11-04 NOTE — Telephone Encounter (Signed)
Last LDL was 87.  Agree with increasing Lasix to 20 mg daily as long as he has tolerated it.

## 2020-11-04 NOTE — Telephone Encounter (Signed)
Spoke with wife who states that she was told to increase Lipitor to 20 mg daily at last visit. Reviewed note and did not see the stated. Please advise.

## 2020-11-04 NOTE — Telephone Encounter (Signed)
Order placed for Lipitor 20 mg Daily to mail order and local pharmacies

## 2020-11-11 DIAGNOSIS — M25421 Effusion, right elbow: Secondary | ICD-10-CM | POA: Diagnosis not present

## 2020-11-11 DIAGNOSIS — Z6822 Body mass index (BMI) 22.0-22.9, adult: Secondary | ICD-10-CM | POA: Diagnosis not present

## 2020-11-11 DIAGNOSIS — M7989 Other specified soft tissue disorders: Secondary | ICD-10-CM | POA: Diagnosis not present

## 2020-12-26 ENCOUNTER — Telehealth: Payer: Self-pay | Admitting: Cardiology

## 2020-12-26 NOTE — Telephone Encounter (Signed)
Seville Downs called wants to know what meds her husband is taking. Please call her [phone].

## 2020-12-26 NOTE — Telephone Encounter (Signed)
Med list reviewed with pt's wife and all questions were answered ./cy

## 2021-01-04 DIAGNOSIS — E7849 Other hyperlipidemia: Secondary | ICD-10-CM | POA: Diagnosis not present

## 2021-01-04 DIAGNOSIS — I10 Essential (primary) hypertension: Secondary | ICD-10-CM | POA: Diagnosis not present

## 2021-01-04 DIAGNOSIS — Z7984 Long term (current) use of oral hypoglycemic drugs: Secondary | ICD-10-CM | POA: Diagnosis not present

## 2021-01-04 DIAGNOSIS — I25812 Atherosclerosis of bypass graft of coronary artery of transplanted heart without angina pectoris: Secondary | ICD-10-CM | POA: Diagnosis not present

## 2021-01-28 DIAGNOSIS — I10 Essential (primary) hypertension: Secondary | ICD-10-CM | POA: Diagnosis not present

## 2021-01-28 DIAGNOSIS — Z1329 Encounter for screening for other suspected endocrine disorder: Secondary | ICD-10-CM | POA: Diagnosis not present

## 2021-01-28 DIAGNOSIS — E781 Pure hyperglyceridemia: Secondary | ICD-10-CM | POA: Diagnosis not present

## 2021-01-28 DIAGNOSIS — R5383 Other fatigue: Secondary | ICD-10-CM | POA: Diagnosis not present

## 2021-02-02 DIAGNOSIS — D500 Iron deficiency anemia secondary to blood loss (chronic): Secondary | ICD-10-CM | POA: Diagnosis not present

## 2021-02-02 DIAGNOSIS — I25812 Atherosclerosis of bypass graft of coronary artery of transplanted heart without angina pectoris: Secondary | ICD-10-CM | POA: Diagnosis not present

## 2021-02-02 DIAGNOSIS — I255 Ischemic cardiomyopathy: Secondary | ICD-10-CM | POA: Diagnosis not present

## 2021-02-02 DIAGNOSIS — R1032 Left lower quadrant pain: Secondary | ICD-10-CM | POA: Diagnosis not present

## 2021-02-02 DIAGNOSIS — I10 Essential (primary) hypertension: Secondary | ICD-10-CM | POA: Diagnosis not present

## 2021-02-02 DIAGNOSIS — Z955 Presence of coronary angioplasty implant and graft: Secondary | ICD-10-CM | POA: Diagnosis not present

## 2021-02-02 DIAGNOSIS — M1A0721 Idiopathic chronic gout, left ankle and foot, with tophus (tophi): Secondary | ICD-10-CM | POA: Diagnosis not present

## 2021-02-02 DIAGNOSIS — I252 Old myocardial infarction: Secondary | ICD-10-CM | POA: Diagnosis not present

## 2021-04-03 ENCOUNTER — Other Ambulatory Visit: Payer: Self-pay | Admitting: Cardiology

## 2021-04-26 ENCOUNTER — Other Ambulatory Visit: Payer: Self-pay | Admitting: Cardiology

## 2021-05-04 DIAGNOSIS — J449 Chronic obstructive pulmonary disease, unspecified: Secondary | ICD-10-CM | POA: Diagnosis not present

## 2021-05-04 DIAGNOSIS — E7849 Other hyperlipidemia: Secondary | ICD-10-CM | POA: Diagnosis not present

## 2021-05-04 DIAGNOSIS — E782 Mixed hyperlipidemia: Secondary | ICD-10-CM | POA: Diagnosis not present

## 2021-05-04 DIAGNOSIS — Z1329 Encounter for screening for other suspected endocrine disorder: Secondary | ICD-10-CM | POA: Diagnosis not present

## 2021-05-04 DIAGNOSIS — I10 Essential (primary) hypertension: Secondary | ICD-10-CM | POA: Diagnosis not present

## 2021-05-06 DIAGNOSIS — E7849 Other hyperlipidemia: Secondary | ICD-10-CM | POA: Diagnosis not present

## 2021-05-06 DIAGNOSIS — M19032 Primary osteoarthritis, left wrist: Secondary | ICD-10-CM | POA: Diagnosis not present

## 2021-05-06 DIAGNOSIS — I10 Essential (primary) hypertension: Secondary | ICD-10-CM | POA: Diagnosis not present

## 2021-05-06 DIAGNOSIS — I77811 Abdominal aortic ectasia: Secondary | ICD-10-CM | POA: Diagnosis not present

## 2021-05-06 DIAGNOSIS — Z955 Presence of coronary angioplasty implant and graft: Secondary | ICD-10-CM | POA: Diagnosis not present

## 2021-05-06 DIAGNOSIS — I25812 Atherosclerosis of bypass graft of coronary artery of transplanted heart without angina pectoris: Secondary | ICD-10-CM | POA: Diagnosis not present

## 2021-05-06 DIAGNOSIS — M1A0721 Idiopathic chronic gout, left ankle and foot, with tophus (tophi): Secondary | ICD-10-CM | POA: Diagnosis not present

## 2021-05-06 DIAGNOSIS — D500 Iron deficiency anemia secondary to blood loss (chronic): Secondary | ICD-10-CM | POA: Diagnosis not present

## 2021-05-10 ENCOUNTER — Other Ambulatory Visit: Payer: Self-pay | Admitting: Cardiology

## 2021-06-07 ENCOUNTER — Other Ambulatory Visit: Payer: Self-pay | Admitting: Cardiology

## 2021-06-24 ENCOUNTER — Telehealth: Payer: Self-pay

## 2021-06-24 MED ORDER — ATORVASTATIN CALCIUM 20 MG PO TABS: 20.0000 mg | ORAL_TABLET | Freq: Every day | ORAL | 0 refills | Status: DC

## 2021-06-24 NOTE — Telephone Encounter (Signed)
Refill request approved for Lipitor. Refill sent to pharmacy per pt request. 30 day 0 refills. Pt need f/u appt.

## 2021-07-05 ENCOUNTER — Other Ambulatory Visit: Payer: Self-pay | Admitting: Cardiology

## 2021-07-14 ENCOUNTER — Emergency Department (EMERGENCY_DEPARTMENT_HOSPITAL): Payer: Medicare Other | Admitting: Certified Registered Nurse Anesthetist

## 2021-07-14 ENCOUNTER — Encounter (HOSPITAL_COMMUNITY): Payer: Self-pay | Admitting: Orthopedic Surgery

## 2021-07-14 ENCOUNTER — Emergency Department (HOSPITAL_COMMUNITY): Payer: Medicare Other

## 2021-07-14 ENCOUNTER — Other Ambulatory Visit: Payer: Self-pay

## 2021-07-14 ENCOUNTER — Emergency Department (HOSPITAL_COMMUNITY): Payer: Medicare Other | Admitting: Certified Registered Nurse Anesthetist

## 2021-07-14 ENCOUNTER — Encounter (HOSPITAL_COMMUNITY)
Admission: EM | Disposition: A | Discharge status: Home / Self Care | Payer: Self-pay | Source: Home / Self Care | Attending: Emergency Medicine

## 2021-07-14 ENCOUNTER — Ambulatory Visit (HOSPITAL_COMMUNITY)
Admission: EM | Admit: 2021-07-14 | Discharge: 2021-07-14 | Disposition: A | Discharge status: Home / Self Care | Payer: Medicare Other | Attending: Emergency Medicine | Admitting: Emergency Medicine

## 2021-07-14 DIAGNOSIS — Z951 Presence of aortocoronary bypass graft: Secondary | ICD-10-CM | POA: Diagnosis not present

## 2021-07-14 DIAGNOSIS — S61214A Laceration without foreign body of right ring finger without damage to nail, initial encounter: Secondary | ICD-10-CM | POA: Insufficient documentation

## 2021-07-14 DIAGNOSIS — Z87891 Personal history of nicotine dependence: Secondary | ICD-10-CM | POA: Insufficient documentation

## 2021-07-14 DIAGNOSIS — N186 End stage renal disease: Secondary | ICD-10-CM | POA: Diagnosis not present

## 2021-07-14 DIAGNOSIS — S68114A Complete traumatic metacarpophalangeal amputation of right ring finger, initial encounter: Secondary | ICD-10-CM | POA: Diagnosis not present

## 2021-07-14 DIAGNOSIS — S61316A Laceration without foreign body of right little finger with damage to nail, initial encounter: Secondary | ICD-10-CM | POA: Insufficient documentation

## 2021-07-14 DIAGNOSIS — I080 Rheumatic disorders of both mitral and aortic valves: Secondary | ICD-10-CM | POA: Diagnosis not present

## 2021-07-14 DIAGNOSIS — K219 Gastro-esophageal reflux disease without esophagitis: Secondary | ICD-10-CM | POA: Diagnosis not present

## 2021-07-14 DIAGNOSIS — S62634A Displaced fracture of distal phalanx of right ring finger, initial encounter for closed fracture: Secondary | ICD-10-CM | POA: Diagnosis not present

## 2021-07-14 DIAGNOSIS — R6889 Other general symptoms and signs: Secondary | ICD-10-CM | POA: Diagnosis not present

## 2021-07-14 DIAGNOSIS — M7989 Other specified soft tissue disorders: Secondary | ICD-10-CM | POA: Diagnosis not present

## 2021-07-14 DIAGNOSIS — S61206A Unspecified open wound of right little finger without damage to nail, initial encounter: Secondary | ICD-10-CM | POA: Diagnosis not present

## 2021-07-14 DIAGNOSIS — Z20822 Contact with and (suspected) exposure to covid-19: Secondary | ICD-10-CM | POA: Diagnosis not present

## 2021-07-14 DIAGNOSIS — S61306A Unspecified open wound of right little finger with damage to nail, initial encounter: Secondary | ICD-10-CM | POA: Diagnosis not present

## 2021-07-14 DIAGNOSIS — Z23 Encounter for immunization: Secondary | ICD-10-CM | POA: Diagnosis not present

## 2021-07-14 DIAGNOSIS — I110 Hypertensive heart disease with heart failure: Secondary | ICD-10-CM | POA: Diagnosis not present

## 2021-07-14 DIAGNOSIS — I132 Hypertensive heart and chronic kidney disease with heart failure and with stage 5 chronic kidney disease, or end stage renal disease: Secondary | ICD-10-CM | POA: Diagnosis not present

## 2021-07-14 DIAGNOSIS — Z79899 Other long term (current) drug therapy: Secondary | ICD-10-CM | POA: Diagnosis not present

## 2021-07-14 DIAGNOSIS — I2510 Atherosclerotic heart disease of native coronary artery without angina pectoris: Secondary | ICD-10-CM | POA: Insufficient documentation

## 2021-07-14 DIAGNOSIS — I509 Heart failure, unspecified: Secondary | ICD-10-CM | POA: Insufficient documentation

## 2021-07-14 DIAGNOSIS — Z955 Presence of coronary angioplasty implant and graft: Secondary | ICD-10-CM | POA: Insufficient documentation

## 2021-07-14 DIAGNOSIS — I252 Old myocardial infarction: Secondary | ICD-10-CM | POA: Diagnosis not present

## 2021-07-14 DIAGNOSIS — S62664A Nondisplaced fracture of distal phalanx of right ring finger, initial encounter for closed fracture: Secondary | ICD-10-CM | POA: Diagnosis not present

## 2021-07-14 DIAGNOSIS — S68124A Partial traumatic metacarpophalangeal amputation of right ring finger, initial encounter: Secondary | ICD-10-CM | POA: Diagnosis not present

## 2021-07-14 DIAGNOSIS — S62624A Displaced fracture of medial phalanx of right ring finger, initial encounter for closed fracture: Secondary | ICD-10-CM | POA: Diagnosis not present

## 2021-07-14 DIAGNOSIS — S61226A Laceration with foreign body of right little finger without damage to nail, initial encounter: Secondary | ICD-10-CM | POA: Diagnosis not present

## 2021-07-14 DIAGNOSIS — Z743 Need for continuous supervision: Secondary | ICD-10-CM | POA: Diagnosis not present

## 2021-07-14 DIAGNOSIS — M19041 Primary osteoarthritis, right hand: Secondary | ICD-10-CM | POA: Diagnosis not present

## 2021-07-14 DIAGNOSIS — S62664B Nondisplaced fracture of distal phalanx of right ring finger, initial encounter for open fracture: Secondary | ICD-10-CM

## 2021-07-14 DIAGNOSIS — R5381 Other malaise: Secondary | ICD-10-CM | POA: Diagnosis not present

## 2021-07-14 DIAGNOSIS — S6990XA Unspecified injury of unspecified wrist, hand and finger(s), initial encounter: Secondary | ICD-10-CM

## 2021-07-14 DIAGNOSIS — W312XXA Contact with powered woodworking and forming machines, initial encounter: Secondary | ICD-10-CM | POA: Diagnosis not present

## 2021-07-14 HISTORY — PX: AMPUTATION: SHX166

## 2021-07-14 LAB — CBC WITH DIFFERENTIAL/PLATELET
Abs Immature Granulocytes: 0.05 10*3/uL (ref 0.00–0.07)
Basophils Absolute: 0 10*3/uL (ref 0.0–0.1)
Basophils Relative: 1 %
Eosinophils Absolute: 0.1 10*3/uL (ref 0.0–0.5)
Eosinophils Relative: 1 %
HCT: 40.6 % (ref 39.0–52.0)
Hemoglobin: 13.5 g/dL (ref 13.0–17.0)
Immature Granulocytes: 1 %
Lymphocytes Relative: 9 %
Lymphs Abs: 0.7 10*3/uL (ref 0.7–4.0)
MCH: 29.9 pg (ref 26.0–34.0)
MCHC: 33.3 g/dL (ref 30.0–36.0)
MCV: 89.8 fL (ref 80.0–100.0)
Monocytes Absolute: 0.5 10*3/uL (ref 0.1–1.0)
Monocytes Relative: 6 %
Neutro Abs: 6.7 10*3/uL (ref 1.7–7.7)
Neutrophils Relative %: 82 %
Platelets: 242 10*3/uL (ref 150–400)
RBC: 4.52 MIL/uL (ref 4.22–5.81)
RDW: 13.2 % (ref 11.5–15.5)
WBC: 8 10*3/uL (ref 4.0–10.5)
nRBC: 0 % (ref 0.0–0.2)

## 2021-07-14 LAB — RESP PANEL BY RT-PCR (FLU A&B, COVID) ARPGX2
Influenza A by PCR: NEGATIVE
Influenza B by PCR: NEGATIVE
SARS Coronavirus 2 by RT PCR: NEGATIVE

## 2021-07-14 LAB — BASIC METABOLIC PANEL
Anion gap: 9 (ref 5–15)
BUN: 26 mg/dL — ABNORMAL HIGH (ref 8–23)
CO2: 23 mmol/L (ref 22–32)
Calcium: 9.5 mg/dL (ref 8.9–10.3)
Chloride: 107 mmol/L (ref 98–111)
Creatinine, Ser: 1.53 mg/dL — ABNORMAL HIGH (ref 0.61–1.24)
GFR, Estimated: 45 mL/min — ABNORMAL LOW (ref >60)
Glucose, Bld: 127 mg/dL — ABNORMAL HIGH (ref 70–99)
Potassium: 4.8 mmol/L (ref 3.5–5.1)
Sodium: 139 mmol/L (ref 135–145)

## 2021-07-14 SURGERY — AMPUTATION DIGIT
Anesthesia: Monitor Anesthesia Care | Laterality: Right

## 2021-07-14 MED ORDER — FENTANYL CITRATE PF 50 MCG/ML IJ SOSY
50.0000 ug | PREFILLED_SYRINGE | Freq: Once | INTRAMUSCULAR | Status: AC
  Administered 2021-07-14: 50 ug via INTRAVENOUS
  Filled 2021-07-14: qty 1

## 2021-07-14 MED ORDER — ONDANSETRON HCL 4 MG/2ML IJ SOLN
INTRAMUSCULAR | Status: AC
  Filled 2021-07-14: qty 2

## 2021-07-14 MED ORDER — LIDOCAINE 2% (20 MG/ML) 5 ML SYRINGE
INTRAMUSCULAR | Status: DC | PRN
Start: 2021-07-14 — End: 2021-07-14
  Administered 2021-07-14: 40 mg via INTRAVENOUS

## 2021-07-14 MED ORDER — PHENYLEPHRINE 40 MCG/ML (10ML) SYRINGE FOR IV PUSH (FOR BLOOD PRESSURE SUPPORT)
PREFILLED_SYRINGE | INTRAVENOUS | Status: DC | PRN
Start: 2021-07-14 — End: 2021-07-14
  Administered 2021-07-14 (×2): 80 ug via INTRAVENOUS

## 2021-07-14 MED ORDER — CHLORHEXIDINE GLUCONATE 0.12 % MT SOLN
15.0000 mL | Freq: Once | OROMUCOSAL | Status: AC
  Administered 2021-07-14: 15 mL via OROMUCOSAL
  Filled 2021-07-14 (×2): qty 15

## 2021-07-14 MED ORDER — OXYCODONE HCL 5 MG/5ML PO SOLN: 5.0000 mg | Freq: Once | ORAL | Status: DC | PRN

## 2021-07-14 MED ORDER — BUPIVACAINE-EPINEPHRINE 0.5% -1:200000 IJ SOLN
INTRAMUSCULAR | Status: DC | PRN
  Administered 2021-07-14: 5 mL

## 2021-07-14 MED ORDER — ACETAMINOPHEN 325 MG PO TABS: 650.0000 mg | ORAL_TABLET | Freq: Four times a day (QID) | ORAL | Status: DC

## 2021-07-14 MED ORDER — TETANUS-DIPHTH-ACELL PERTUSSIS 5-2.5-18.5 LF-MCG/0.5 IM SUSY
0.5000 mL | PREFILLED_SYRINGE | Freq: Once | INTRAMUSCULAR | Status: AC
  Administered 2021-07-14: 0.5 mL via INTRAMUSCULAR
  Filled 2021-07-14: qty 0.5

## 2021-07-14 MED ORDER — CEPHALEXIN 500 MG PO CAPS: 500.0000 mg | ORAL_CAPSULE | Freq: Four times a day (QID) | ORAL | 0 refills | Status: AC

## 2021-07-14 MED ORDER — PHENYLEPHRINE 40 MCG/ML (10ML) SYRINGE FOR IV PUSH (FOR BLOOD PRESSURE SUPPORT)
PREFILLED_SYRINGE | INTRAVENOUS | Status: AC
  Filled 2021-07-14: qty 10

## 2021-07-14 MED ORDER — LIDOCAINE HCL 1 % IJ SOLN
INTRAMUSCULAR | Status: DC | PRN
  Administered 2021-07-14: 5 mL

## 2021-07-14 MED ORDER — ONDANSETRON HCL 4 MG/2ML IJ SOLN
INTRAMUSCULAR | Status: DC | PRN
  Administered 2021-07-14: 4 mg via INTRAVENOUS

## 2021-07-14 MED ORDER — OXYCODONE HCL 5 MG PO TABS: 5.0000 mg | ORAL_TABLET | Freq: Once | ORAL | Status: DC | PRN

## 2021-07-14 MED ORDER — LACTATED RINGERS IV SOLN: INTRAVENOUS | Status: DC

## 2021-07-14 MED ORDER — CEFAZOLIN SODIUM-DEXTROSE 1-4 GM/50ML-% IV SOLN
INTRAVENOUS | Status: AC
  Filled 2021-07-14: qty 50

## 2021-07-14 MED ORDER — OXYCODONE HCL 5 MG PO TABS
5.0000 mg | ORAL_TABLET | Freq: Four times a day (QID) | ORAL | 0 refills | Status: DC | PRN
Start: 2021-07-14 — End: 2021-10-08

## 2021-07-14 MED ORDER — BUPIVACAINE HCL (PF) 0.5 % IJ SOLN
INTRAMUSCULAR | Status: AC
  Filled 2021-07-14: qty 10

## 2021-07-14 MED ORDER — FENTANYL CITRATE (PF) 250 MCG/5ML IJ SOLN
INTRAMUSCULAR | Status: AC
  Filled 2021-07-14: qty 5

## 2021-07-14 MED ORDER — ACETAMINOPHEN 500 MG PO TABS: 1000.0000 mg | ORAL_TABLET | Freq: Once | ORAL | Status: DC | PRN

## 2021-07-14 MED ORDER — ACETAMINOPHEN 10 MG/ML IV SOLN: 1000.0000 mg | Freq: Once | INTRAVENOUS | Status: DC | PRN

## 2021-07-14 MED ORDER — PROPOFOL 10 MG/ML IV BOLUS
INTRAVENOUS | Status: AC
  Filled 2021-07-14: qty 20

## 2021-07-14 MED ORDER — CEFAZOLIN SODIUM-DEXTROSE 1-4 GM/50ML-% IV SOLN
1.0000 g | Freq: Once | INTRAVENOUS | Status: AC
  Administered 2021-07-14: 1 g via INTRAVENOUS
  Filled 2021-07-14: qty 50

## 2021-07-14 MED ORDER — ACETAMINOPHEN 160 MG/5ML PO SOLN: 1000.0000 mg | Freq: Once | ORAL | Status: DC | PRN

## 2021-07-14 MED ORDER — ORAL CARE MOUTH RINSE: 15.0000 mL | Freq: Once | OROMUCOSAL | Status: AC

## 2021-07-14 MED ORDER — FENTANYL CITRATE (PF) 100 MCG/2ML IJ SOLN: 25.0000 ug | INTRAMUSCULAR | Status: DC | PRN

## 2021-07-14 MED ORDER — PROPOFOL 10 MG/ML IV BOLUS
INTRAVENOUS | Status: DC | PRN
  Administered 2021-07-14: 20 mg via INTRAVENOUS

## 2021-07-14 MED ORDER — LIDOCAINE HCL 1 % IJ SOLN
INTRAMUSCULAR | Status: AC
  Filled 2021-07-14: qty 20

## 2021-07-14 MED ORDER — PROPOFOL 500 MG/50ML IV EMUL
INTRAVENOUS | Status: DC | PRN
  Administered 2021-07-14: 100 ug/kg/min via INTRAVENOUS

## 2021-07-14 MED ORDER — CEFAZOLIN SODIUM-DEXTROSE 1-4 GM/50ML-% IV SOLN
INTRAVENOUS | Status: DC | PRN
  Administered 2021-07-14: 1 g via INTRAVENOUS

## 2021-07-14 MED ORDER — LIDOCAINE 2% (20 MG/ML) 5 ML SYRINGE
INTRAMUSCULAR | Status: AC
  Filled 2021-07-14: qty 5

## 2021-07-14 SURGICAL SUPPLY — 42 items
APL PRP STRL LF DISP 70% ISPRP (MISCELLANEOUS) ×1
BAG COUNTER SPONGE SURGICOUNT (BAG) ×2 IMPLANT
BAG SPNG CNTER NS LX DISP (BAG) ×1
BLADE AVERAGE 25X9 (BLADE) IMPLANT
BNDG CMPR 9X4 STRL LF SNTH (GAUZE/BANDAGES/DRESSINGS)
BNDG COHESIVE 2X5 TAN STRL LF (GAUZE/BANDAGES/DRESSINGS) IMPLANT
BNDG COHESIVE 4X5 TAN ST LF (GAUZE/BANDAGES/DRESSINGS) ×1 IMPLANT
BNDG COHESIVE 4X5 TAN STRL (GAUZE/BANDAGES/DRESSINGS) IMPLANT
BNDG CONFORM 2 STRL LF (GAUZE/BANDAGES/DRESSINGS) IMPLANT
BNDG CONFORM 3 STRL LF (GAUZE/BANDAGES/DRESSINGS) ×2 IMPLANT
BNDG ELASTIC 2X5.8 VLCR STR LF (GAUZE/BANDAGES/DRESSINGS) IMPLANT
BNDG ESMARK 4X9 LF (GAUZE/BANDAGES/DRESSINGS) IMPLANT
BNDG GAUZE ELAST 4 BULKY (GAUZE/BANDAGES/DRESSINGS) ×3 IMPLANT
CHLORAPREP W/TINT 26 (MISCELLANEOUS) ×2 IMPLANT
CORD BIPOLAR FORCEPS 12FT (ELECTRODE) ×2 IMPLANT
DRAPE SURG 17X23 STRL (DRAPES) ×2 IMPLANT
DRSG ADAPTIC 3X8 NADH LF (GAUZE/BANDAGES/DRESSINGS) ×1 IMPLANT
DRSG EMULSION OIL 3X3 NADH (GAUZE/BANDAGES/DRESSINGS) ×2 IMPLANT
ELECT REM PT RETURN 9FT ADLT (ELECTROSURGICAL) ×2
ELECTRODE REM PT RTRN 9FT ADLT (ELECTROSURGICAL) ×1 IMPLANT
GAUZE SPONGE 4X4 12PLY STRL (GAUZE/BANDAGES/DRESSINGS) ×2 IMPLANT
GAUZE XEROFORM 1X8 LF (GAUZE/BANDAGES/DRESSINGS) ×2 IMPLANT
GLOVE SRG 8 PF TXTR STRL LF DI (GLOVE) ×1 IMPLANT
GLOVE SURG ENC MOIS LTX SZ7.5 (GLOVE) ×2 IMPLANT
GLOVE SURG UNDER POLY LF SZ8 (GLOVE) ×2
GOWN STRL REUS W/ TWL XL LVL3 (GOWN DISPOSABLE) ×1 IMPLANT
GOWN STRL REUS W/TWL XL LVL3 (GOWN DISPOSABLE) ×2
KIT BASIN OR (CUSTOM PROCEDURE TRAY) ×2 IMPLANT
NDL HYPO 25X1 1.5 SAFETY (NEEDLE) IMPLANT
NEEDLE HYPO 25X1 1.5 SAFETY (NEEDLE) IMPLANT
PACK ORTHO EXTREMITY (CUSTOM PROCEDURE TRAY) ×2 IMPLANT
PAD CAST 4YDX4 CTTN HI CHSV (CAST SUPPLIES) IMPLANT
PADDING CAST ABS 4INX4YD NS (CAST SUPPLIES) ×1
PADDING CAST ABS COTTON 4X4 ST (CAST SUPPLIES) ×1 IMPLANT
PADDING CAST COTTON 4X4 STRL (CAST SUPPLIES) ×2
PENCIL BUTTON HOLSTER BLD 10FT (ELECTRODE) IMPLANT
SUT ETHILON 4 0 PS 2 18 (SUTURE) IMPLANT
SUT MNCRL AB 4-0 PS2 18 (SUTURE) IMPLANT
SUT VICRYL RAPIDE 4/0 PS 2 (SUTURE) IMPLANT
SYR 10ML LL (SYRINGE) IMPLANT
TOWEL GREEN STERILE FF (TOWEL DISPOSABLE) ×2 IMPLANT
UNDERPAD 30X36 HEAVY ABSORB (UNDERPADS AND DIAPERS) ×2 IMPLANT

## 2021-07-14 NOTE — Progress Notes (Signed)
Pts daughter in law returned call, states she will update wife about pt being in hospital.

## 2021-07-14 NOTE — ED Triage Notes (Signed)
BIB [facility] after pt called to report that he cut his right ring finger using a saw. Per EMS, pt sustained partial amputation of the right ring finger and deep lac to right pinkie

## 2021-07-14 NOTE — Transfer of Care (Signed)
Immediate Anesthesia Transfer of Care Note  Patient: Billy [name]  Procedure(s) Performed: REVISION AMPUTATION RIGHT RING FINGER NAILBED REPAIR RIGHT LITTLE FINGER (Right)  Patient Location: PACU  Anesthesia Type:MAC  Level of Consciousness: awake and alert   Airway & Oxygen Therapy: Patient Spontanous Breathing and Patient connected to face mask oxygen  Post-op Assessment: Report given to RN and Post -op Vital signs reviewed and stable  Post vital signs: Reviewed and stable  Last Vitals:  Vitals Value Taken Time  BP 135/62 07/14/21 1830  Temp    Pulse 75 07/14/21 1831  Resp 17 07/14/21 1831  SpO2 100 % 07/14/21 1831  Vitals shown include unvalidated device data.  Last Pain:  Vitals:   07/14/21 1705  TempSrc: Oral  PainSc: 2       Patients Stated Pain Goal: 2 (19/62/22 9798)  Complications: No notable events documented.

## 2021-07-14 NOTE — Discharge Instructions (Addendum)
Discharge Instructions   You have a light dressing on your hand.  You may begin gentle motion of your fingers and hand immediately, but you should not do any heavy lifting or gripping.  Elevate your hand to reduce pain & swelling of the digits.  Ice over the operative site may be helpful to reduce pain & swelling.  DO NOT USE HEAT.  Pain medicine has been prescribed for you. Use the tylenol primarily, and add the oxycodone for pain beyond what the tylenol handles.   The Cephalexin is to try to prevent infection.  Take it as prescribed until gone.  Leave the dressing in place until you return to the office, keeping it clean and dry  You may drive a car when you are off of prescription pain medications and can safely control your vehicle with both hands. We will address whether therapy will be required or not when you return to the office. You may have already made your follow-up appointment when we completed your preop visit.  If not, please call our office today or the next business day to make your return appointment for 10-15 days after surgery.   Please call [phone] during normal business hours or [phone] after hours for any problems. Including the following:  - excessive redness of the incisions - drainage for more than 4 days - fever of more than 101.5 F  *Please note that pain medications will not be refilled after hours or on weekends.

## 2021-07-14 NOTE — Anesthesia Postprocedure Evaluation (Signed)
Anesthesia Post Note  Patient: Billy [name]  Procedure(s) Performed: REVISION AMPUTATION RIGHT RING FINGER NAILBED REPAIR RIGHT LITTLE FINGER (Right)     Patient location during evaluation: PACU Anesthesia Type: MAC Level of consciousness: awake Pain management: pain level controlled Vital Signs Assessment: post-procedure vital signs reviewed and stable Respiratory status: spontaneous breathing Cardiovascular status: stable Postop Assessment: no apparent nausea or vomiting Anesthetic complications: no   No notable events documented.  Last Vitals:  Vitals:   07/14/21 1830 07/14/21 1845  BP: 135/62 (!) 155/76  Pulse: 77 95  Resp: 12 13  Temp: 36.7 C 36.9 C  SpO2: 100% 98%    Last Pain:  Vitals:   07/14/21 1845  TempSrc:   PainSc: 0-No pain                 Lorne Winkels

## 2021-07-14 NOTE — Op Note (Signed)
07/14/2021  5:37 PM  PATIENT:  Billy [name]  87 y.o. male  PRE-OPERATIVE DIAGNOSIS: Complex tablesaw injury to right ring and small fingers  POST-OPERATIVE DIAGNOSIS:  Same  PROCEDURE:   Right small finger: 1.  Removal of distal half of the nail plate       2.  Nailbed repair       3.  Simple repair of skin, 1.5 cm    Right ring finger: 1.  Revision of traumatic amputation with direct closure through the distal aspect of P2  SURGEON: Rayvon Char. Grandville Silos, MD  PHYSICIAN ASSISTANT: None  ANESTHESIA: Digital block/MAC  SPECIMENS:  None  DRAINS:   None  EBL: Less than 10 mL  PREOPERATIVE INDICATIONS:  [name] is a  87 y.o. male with a complex tablesaw injury, primarily to the ring finger, but also the tip of the small finger  The risks benefits and alternatives were discussed with the patient preoperatively including but not limited to the risks of infection, bleeding, nerve injury, cardiopulmonary complications, the need for revision surgery, among others, and the patient verbalized understanding and consented to proceed.  OPERATIVE IMPLANTS: None  OPERATIVE PROCEDURE:  After receiving prophylactic antibiotics, the patient was escorted to the operative theatre and placed in a supine position.  A surgical time-out was performed during which the planned procedure, proposed operative site, and the correct patient identity were compared to the operative consent and agreement confirmed by the circulating nurse according to current facility policy.  I performed a digital block of the ring and small fingers with a mixture of lidocaine and Marcaine.  Following application of a tourniquet to the operative extremity, the exposed skin was prescrubbed with a Hibiclens scrub brush before being formally prepped with Chloraprep and draped in the usual sterile fashion.  The tourniquet was not inflated.  The injuries were irrigated copiously with bulb irrigation.  The right small finger was  addressed first.  The injury was a dorsal oblique, with an intact volar and radial skin bridge.  The distal half of the exposed nail plate was removed, revealing the underlying nailbed injury.  This was repaired with Vicryl Rapide suture, which included repairing the nailbed and extending for a centimeter into the radial distal pulp tissue and glabrous skin.  Attention was shifted to the ring finger  The ring finger was injured such that there was already created a very large dorsal and palmar flap, with much of the distal phalanx and DIP joint skeleton no longer present.  The remainder of the bone fragments were excised, and the distal aspect of the remaining middle phalanx was smoothed with a ronjure.  The dorsal flap was divided transversely, proximal to the nail matrix and the palmar flap was divided to allow for a typical fishmouth closure with a longer palmar than dorsal flap.  The skin was then inset with 4-0 Vicryl Rapide interrupted sutures with some additional flap trimming and insetting as required.  The skin was not under tension.  A bulky dressing was applied that incorporated the ring and small fingers, leaving the thumb, index and long fingers free.  It was forearm-based.  There was no plaster.  He was taken to recovery in stable condition  DISPOSITION: He will be discharged home today with typical instructions, plan for analgesia and oral antibiotics, with initial reevaluation in approximately a week.

## 2021-07-14 NOTE — Anesthesia Postprocedure Evaluation (Signed)
Anesthesia Post Note ° °Patient: Billy [name] ° °Procedure(s) Performed: REVISION AMPUTATION RIGHT RING FINGER NAILBED REPAIR RIGHT LITTLE FINGER (Right) ° °  ° °Patient location during evaluation: PACU °Anesthesia Type: MAC °Level of consciousness: awake °Pain management: pain level controlled °Vital Signs Assessment: post-procedure vital signs reviewed and stable °Respiratory status: spontaneous breathing °Cardiovascular status: stable °Postop Assessment: no apparent nausea or vomiting °Anesthetic complications: no ° ° °No notable events documented. ° °Last Vitals:  °Vitals:  ° 07/14/21 1830 07/14/21 1845  °BP: 135/62 (!) 155/76  °Pulse: 77 95  °Resp: 12 13  °Temp: 36.7 °C 36.9 °C  °SpO2: 100% 98%  °  °Last Pain:  °Vitals:  ° 07/14/21 1845  °TempSrc:   °PainSc: 0-No pain  ° ° °  °  °  °  °  °  ° °Pranavi Aure ° ° ° ° °

## 2021-07-14 NOTE — ED Provider Notes (Signed)
[facility] EMERGENCY DEPARTMENT Provider Note   CSN: [id number] Arrival date & time: 07/14/21  1417     History  Chief Complaint  Patient presents with   Finger Injury    Billy [name] is a 87 y.o. male.  HPI He was working with a table saw today, had made a cut, reached over to pick up a board when his fingers got entangled in the sawblade.  He arrives by EMS for evaluation.  Unknown prior tetanus timing.    Home Medications Prior to Admission medications   Medication Sig Start Date End Date Taking? Authorizing Provider  acetaminophen (TYLENOL) 325 MG tablet Take 2 tablets (650 mg total) by mouth every 6 (six) hours. 07/14/21  Yes Milly Jakob, MD  aspirin 81 MG chewable tablet Chew 81 mg by mouth daily.   Yes [provider]  atorvastatin (LIPITOR) 20 MG tablet Take 1 tablet (20 mg total) by mouth daily. 06/24/21 09/22/21 Yes Satira Sark, MD  cephALEXin (KEFLEX) 500 MG capsule Take 1 capsule (500 mg total) by mouth 4 (four) times daily for 7 days. 07/14/21 07/21/21 Yes Milly Jakob, MD  clopidogrel (PLAVIX) 75 MG tablet TAKE 1 TABLET BY MOUTH  DAILY 04/27/21  Yes Satira Sark, MD  oxyCODONE (ROXICODONE) 5 MG immediate release tablet Take 1 tablet (5 mg total) by mouth every 6 (six) hours as needed for severe pain. 07/14/21  Yes Milly Jakob, MD  COLCRYS 0.6 MG tablet Take 1 tablet by mouth Once daily as needed (for gout).  03/05/12   [provider]  cyclobenzaprine (FLEXERIL) 10 MG tablet Take 1 tablet (10 mg total) by mouth 3 times/day as needed-between meals & bedtime for muscle spasms. Patient taking differently: Take 10 mg by mouth every other day. 04/24/16   Laqueta Linden, MD  docusate sodium (COLACE) 100 MG capsule Take 100 mg by mouth daily.    [provider]  esomeprazole (NEXIUM) 20 MG capsule TAKE 1 CAPSULE BY MOUTH  DAILY AT NOON. 08/07/19   Satira Sark, MD  finasteride (PROSCAR) 5 MG tablet Take 5  mg by mouth daily.    [provider]  fluticasone (FLONASE) 50 MCG/ACT nasal spray Place 1 spray into both nostrils as needed for allergies or rhinitis.    [provider]  isosorbide mononitrate (IMDUR) 60 MG 24 hr tablet TAKE ONE-HALF TABLET BY  MOUTH DAILY 08/04/20   Satira Sark, MD  Magnesium 250 MG TABS Take 0.5 tablets by mouth daily.     [provider]  metoprolol succinate (TOPROL-XL) 25 MG 24 hr tablet TAKE 1 TABLET BY MOUTH ONCE  DAILY 07/06/21   Satira Sark, MD  nitroGLYCERIN (NITROSTAT) 0.4 MG SL tablet DISSOLVE 1 TABLET UNDER THE TONGUE EVERY 5 MINUTES AS  NEEDED FOR CHEST PAIN. MAX  OF 3 TABLETS IN 15 MINUTES. CALL 911 IF PAIN PERSISTS. 06/13/20   Satira Sark, MD  tamsulosin (FLOMAX) 0.4 MG CAPS capsule Take 0.4 mg by mouth at bedtime.    [provider]      Allergies    Protonix [pantoprazole sodium]    Review of Systems   Review of Systems  Physical Exam Updated Vital Signs BP (!) 155/76    Pulse 95    Temp 98.4 F (36.9 C)    Resp 13    Ht 5\' 7"  (1.702 m)    Wt 64.4 kg    SpO2 98%    BMI 22.24  kg/m  Physical Exam Vitals and nursing note reviewed.  Constitutional:      General: He is not in acute distress.    Appearance: He is well-developed. He is not ill-appearing, toxic-appearing or diaphoretic.  HENT:     Head: Normocephalic and atraumatic.     Right Ear: External ear normal.     Left Ear: External ear normal.  Eyes:     Conjunctiva/sclera: Conjunctivae normal.     Pupils: Pupils are equal, round, and reactive to light.  Neck:     Trachea: Phonation normal.  Cardiovascular:     Rate and Rhythm: Normal rate.  Pulmonary:     Effort: Pulmonary effort is normal.  Abdominal:     General: There is no distension.     Tenderness: There is no abdominal tenderness.  Musculoskeletal:     Cervical back: Normal range of motion and neck supple.     Comments: Complicated laceration, right fourth finger, multiple  legs, beginning at roughly the middle part of the middle phalanx, ischemia noted of the associated nailbed.  Distal fourth finger is unstable.  Right fifth finger with distal tip laceration, partially involving the nail.  Finger function appears normal.  Skin:    General: Skin is warm and dry.  Neurological:     Mental Status: He is alert and oriented to person, place, and time.     Cranial Nerves: No cranial nerve deficit.     Sensory: No sensory deficit.     Motor: No abnormal muscle tone.     Coordination: Coordination normal.  Psychiatric:        Mood and Affect: Mood normal.        Behavior: Behavior normal.        Thought Content: Thought content normal.        Judgment: Judgment normal.    ED Results / Procedures / Treatments   Labs (all labs ordered are listed, but only abnormal results are displayed) Labs Reviewed  BASIC METABOLIC PANEL - Abnormal; Notable for the following components:      Result Value   Glucose, Bld 127 (*)    BUN 26 (*)    Creatinine, Ser 1.53 (*)    GFR, Estimated 45 (*)    All other components within normal limits  RESP PANEL BY RT-PCR (FLU A&B, COVID) ARPGX2  CBC WITH DIFFERENTIAL/PLATELET    EKG None  Radiology DG Hand Complete Right  Result Date: 07/14/2021 CLINICAL DATA:  Right hand laceration from table saw EXAM: RIGHT HAND - COMPLETE 3+ VIEW COMPARISON:  10/19/2020 FINDINGS: Acute comminuted and displaced fracture through the distal phalanx of the right ring finger. Fracture extends intra-articularly to the distal interphalangeal joint. The head of the ring finger middle phalanx is also fractured. Overlying soft tissue swelling and irregularity compatible with open fracture. Minimally comminuted and displaced fracture involving the distal tuft of the small finger distal phalanx with overlying soft tissue irregularity. Similar changes of advanced distal-predominant arthropathy of the right hand. No retained radiopaque foreign bodies are seen  IMPRESSION: 1. Acute comminuted and displaced open fractures involving the distal phalanx and middle phalanx of the right ring finger with intra-articular extension to the DIP joint. 2. Acute minimally displaced open fracture of the distal tuft of the small finger distal phalanx. Electronically Signed   By: Davina Poke D.O.   On: 07/14/2021 15:33    Procedures Procedures    Medications Ordered in ED Medications  Tdap (BOOSTRIX) injection 0.5 mL (0.5 mLs  Intramuscular Given 07/14/21 1450)  ceFAZolin (ANCEF) IVPB 1 g/50 mL premix (0 g Intravenous Stopped 07/14/21 1525)  fentaNYL (SUBLIMAZE) injection 50 mcg (50 mcg Intravenous Given 07/14/21 1500)  chlorhexidine (PERIDEX) 0.12 % solution 15 mL (15 mLs Mouth/Throat Given 07/14/21 1656)    Or  MEDLINE mouth rinse ( Mouth Rinse See Alternative 07/14/21 1656)  ceFAZolin (ANCEF) 1-4 GM/50ML-% IVPB (  Override pull for Anesthesia 07/14/21 1805)    ED Course/ Medical Decision Making/ A&P                           Medical Decision Making Patient presenting with tablesaw injury, running blade, gets fingers 4 and 5 right hand.  Complicated laceration right fourth finger, likely involving nerve artery and vein, from the middle aspect of the middle phalanx.  Fifth finger laceration, uncomplicated, other than distal nail involvement, distal tip.  Problems Addressed: Laceration of right ring finger, foreign body presence unspecified, nail damage status unspecified, initial encounter: undiagnosed new problem with uncertain prognosis    Details: Complicated fracture, nerve artery and vein, possible requirement for amputation.  Amount and/or Complexity of Data Reviewed Independent Historian:     Details: Patient gives cogent history External Data Reviewed: notes.    Details: Office visit for surveillance of chronic conditions; hypertension, hyperglycemia and hyperlipidemia-7 months ago. Labs: ordered.    Details: CBC, metabolic panel, viral panel-normal  except glucose high, BUN high, creatinine high, GFR low Radiology: ordered and independent interpretation performed.    Details: Right hand-complicated distal phalanx fracture, open, displaced.  Likely tuft fracture right finger 5.  No other fracture or foreign body appreciated. Discussion of management or test interpretation with external provider(s): Consultation hand surgery regarding case findings and need for likely operative management.  Patient transferred to the OR, after their evaluation.  Risk Prescription drug management. Minor surgery with no identified risk factors. Risk Details: Complicated laceration right fourth finger, and laceration right fifth finger.  Tetanus booster required to prevent tetanus infection.  Hand surgery consult following x-ray, to consider proper treatment for complicated fracture with laceration.  Anticipate operative management and likely shortening of the right ring finger, with laceration repairs.           Final Clinical Impression(s) / ED Diagnoses Final diagnoses:  Laceration of right ring finger, foreign body presence unspecified, nail damage status unspecified, initial encounter  Open nondisplaced fracture of distal phalanx of right ring finger, initial encounter  Laceration of right little finger without foreign body with damage to nail, initial encounter    Rx / DC Orders ED Discharge Orders          Ordered    acetaminophen (TYLENOL) 325 MG tablet  Every 6 hours        07/14/21 1739    oxyCODONE (ROXICODONE) 5 MG immediate release tablet  Every 6 hours PRN        07/14/21 1739    cephALEXin (KEFLEX) 500 MG capsule  4 times daily        07/14/21 1739              Daleen Bo, MD 07/15/21 1214

## 2021-07-14 NOTE — Anesthesia Preprocedure Evaluation (Addendum)
Anesthesia Evaluation  Patient identified by MRN, date of birth, ID band Patient awake    Reviewed: Allergy & Precautions, NPO status , Patient's Chart, lab work & pertinent test results  History of Anesthesia Complications Negative for: history of anesthetic complications  Airway Mallampati: II  TM Distance: >3 FB Neck ROM: Full    Dental  (+) Edentulous Upper, Edentulous Lower, Lower Dentures, Upper Dentures, Dental Advisory Given   Pulmonary former smoker,    breath sounds clear to auscultation       Cardiovascular hypertension, (-) angina+ CAD, + Past MI, + Cardiac Stents, + CABG and +CHF   Rhythm:Regular  a. /p CABG in 2001 b. DESx2 to SVG-OM1 and staged DES to SVG-RCA in 04/2014 c. DES to SVG-PDA in 05/2015 d. DESx2 to SVG-OM1-OM2 in 04/2016 e. 06/2017: DESx2 to mid and distal body of SVG-RCA  1. The left ventricle has a visually estimated ejection fraction of of  45%. The inferior and inferolateral walls are akinetic.  2. The mitral valve is myxomatous. Mild thickening of the mitral valve  leaflet. Mild calcification of the mitral valve leaflet. There is mild  mitral annular calcification present. Mitral valve regurgitation is  moderate by color flow Doppler.  3. The aortic valve is tricuspid Mild thickening of the aortic valve Mild  calcification of the aortic valve. Aortic valve regurgitation is mild by  color flow Doppler.  4. The aortic root is normal in size and structure.  5. The right ventricle has normal systolic function. The cavity was  normal. There is no increase in right ventricular wall thickness.  6. The tricuspid valve is normal in structure.  7. Left atrial size was mildly dilated.    Neuro/Psych negative neurological ROS  negative psych ROS   GI/Hepatic Neg liver ROS, GERD  Medicated,  Endo/Other  negative endocrine ROS  Renal/GU ESRFRenal disease     Musculoskeletal   Abdominal    Peds  Hematology  (+) Blood dyscrasia, , Lab Results      Component                Value               Date                      WBC                      8.0                 07/14/2021                HGB                      13.5                07/14/2021                HCT                      40.6                07/14/2021                MCV                      89.8  07/14/2021                PLT                      242                 07/14/2021             plavix   Anesthesia Other Findings   Reproductive/Obstetrics                             Anesthesia Physical Anesthesia Plan  ASA: 3  Anesthesia Plan: MAC   Post-op Pain Management:    Induction: Intravenous  PONV Risk Score and Plan: 1 and Ondansetron and Propofol infusion  Airway Management Planned: Nasal Cannula  Additional Equipment: None  Intra-op Plan:   Post-operative Plan:   Informed Consent: I have reviewed the patients History and Physical, chart, labs and discussed the procedure including the risks, benefits and alternatives for the proposed anesthesia with the patient or authorized representative who has indicated his/her understanding and acceptance.     Dental advisory given  Plan Discussed with: CRNA and Anesthesiologist  Anesthesia Plan Comments:        Anesthesia Quick Evaluation

## 2021-07-14 NOTE — Consult Note (Addendum)
Reason for Consult:Right hand injury Referring Physician: Daleen Bo Time called: 1448 Time at bedside: Billy [name] is an 87 y.o. male.  HPI: Billy [name] was working with a table saw when a board kicked back and struck him on the right hand. He doesn't think he touched the blade but can't be sure. At first he didn't have pain but quickly saw he had a significant injury and came to the ED for evaluation and hand surgery was consulted. He is RHD.  Past Medical History:  Diagnosis Date   Arthritis    Carotid artery disease ([place])    18-56% LICA and 3-14% RICA - December 2013   Coronary atherosclerosis of native coronary artery    a. /p CABG in 2001 b. DESx2 to SVG-OM1 and staged DES to SVG-RCA in 04/2014 c. DES to SVG-PDA in 05/2015 d. DESx2 to SVG-OM1-OM2 in 04/2016 e. 06/2017: DESx2 to mid and distal body of SVG-RCA   Essential hypertension    GERD (gastroesophageal reflux disease)    Gout    History of blood transfusion 2017   HOH (hard of hearing)    Mixed hyperlipidemia    Myocardial infarction ([place]) 2017   Sinus bradycardia    Asymptomatic   Skin cancer     Past Surgical History:  Procedure Laterality Date   APPENDECTOMY     Ruptured   CARDIAC CATHETERIZATION N/A 05/19/2015   Procedure: Left Heart Cath and Cors/Grafts Angiography;  Surgeon: Lorretta Harp, MD;  Location: [facility];  Service: Cardiovascular;  Laterality: N/A;   CARDIAC CATHETERIZATION N/A 05/19/2015   Procedure: Coronary Stent Intervention;  Surgeon: Lorretta Harp, MD;  Location: [facility];  Service: Cardiovascular;  Laterality: N/A;   CARDIAC CATHETERIZATION N/A 04/23/2016   Procedure: LEFT HEART CATH AND CORS/GRAFTS ANGIOGRAPHY;  Surgeon: Leonie Man, MD;  Location: [facility] [facility] [facility] LAB;  Service: Cardiovascular;  Laterality: N/A;   CARDIAC CATHETERIZATION N/A 04/23/2016   Procedure: Coronary Stent Intervention;  Surgeon: Leonie Man, MD;  Location: [facility] LAB;   Service: Cardiovascular;  Laterality: N/A;   CATARACT EXTRACTION W/PHACO Right 11/13/2012   Procedure: CATARACT EXTRACTION PHACO AND INTRAOCULAR LENS PLACEMENT (IOC);  Surgeon: Tonny Branch, MD;  Location: [facility];  Service: Ophthalmology;  Laterality: Right;  CDE:12.75   CATARACT EXTRACTION W/PHACO Left 01/11/2013   Procedure: CATARACT EXTRACTION PHACO AND INTRAOCULAR LENS PLACEMENT (IOC);  Surgeon: Tonny Branch, MD;  Location: [facility];  Service: Ophthalmology;  Laterality: Left;  CDE: 21.13   COLONOSCOPY  09/2013   Dr. Burke Keels: colitis descending colon and sigmoid colon.Bx ?Cdiff vs ischemic.   CORONARY ANGIOPLASTY WITH STENT PLACEMENT  07/01/2017   CORONARY ARTERY BYPASS GRAFT  11/09/1999   LIMA to LAD, SVG to diagonal, SVG to OM1 and OM2, SVG to PDA   CORONARY STENT INTERVENTION N/A 07/01/2017   Procedure: CORONARY STENT INTERVENTION;  Surgeon: Burnell Blanks, MD;  Location: [place] [facility];  Service: Cardiovascular;  Laterality: N/A;   ENTEROSCOPY N/A 12/26/2015   Procedure: ENTEROSCOPY;  Surgeon: Daneil Dolin, MD;  [facility];  Service: Endoscopy;  Laterality: N/A;   ESOPHAGOGASTRODUODENOSCOPY N/A 05/20/2015   Procedure: ESOPHAGOGASTRODUODENOSCOPY (EGD);  Surgeon: Wilford Corner, MD;  Location: [facility] ENDOSCOPY;  Service: Endoscopy;  Laterality: N/A;   ESOPHAGOGASTRODUODENOSCOPY N/A 12/26/2015   Procedure: ESOPHAGOGASTRODUODENOSCOPY (EGD);  Surgeon: Daneil Dolin, MD;  [facility];  Service: Endoscopy;  Laterality: N/A;   GIVENS CAPSULE STUDY  N/A 12/24/2015   Procedure: GIVENS CAPSULE STUDY;  Surgeon: Rogene Houston, MD;  [facility];  Service: Endoscopy;  Laterality: N/A;   LEFT HEART CATH AND CORS/GRAFTS ANGIOGRAPHY N/A 07/01/2017   Procedure: LEFT HEART CATH AND CORS/GRAFTS ANGIOGRAPHY;  Surgeon: Burnell Blanks, MD;  Location: [facility] [facility];  Service: Cardiovascular;  Laterality: N/A;   LEFT HEART CATHETERIZATION WITH CORONARY  ANGIOGRAM N/A 04/22/2014   Procedure: LEFT HEART CATHETERIZATION WITH CORONARY ANGIOGRAM;  Surgeon: Burnell Blanks, MD;  Location: [facility] CATH LAB;  Service: Cardiovascular;  Laterality: N/A;   PERCUTANEOUS CORONARY STENT INTERVENTION (PCI-S) N/A 04/25/2014   Procedure: PERCUTANEOUS CORONARY STENT INTERVENTION (PCI-S);  Surgeon: Peter M Martinique, MD;  Location: [facility] CATH LAB;  Service: Cardiovascular;  Laterality: N/A;   PERIPHERAL VASCULAR CATHETERIZATION  04/23/2016   Procedure: Thrombectomy;  Surgeon: Leonie Man, MD;  Location: [place] CV LAB;  Service: Cardiovascular;;    Family History  Problem Relation Age of Onset   Cancer Father        colon, age 54s    Social History:  reports that he quit smoking about 22 years ago. His smoking use included cigarettes. He started smoking about 74 years ago. He has a 25.00 pack-year smoking history. He has never used smokeless tobacco. He reports that he does not drink alcohol and does not use drugs.  Allergies:  Allergies  Allergen Reactions   Protonix [Pantoprazole Sodium] Itching and Rash    Medications: I have reviewed the patient's current medications.  Results for orders placed or performed during the hospital encounter of 07/14/21 (from the past 48 hour(s))  CBC with Differential     Status: None   Collection Time: 07/14/21  2:44 PM  Result Value Ref Range   WBC 8.0 4.0 - 10.5 K/uL   RBC 4.52 4.22 - 5.81 MIL/uL   Hemoglobin 13.5 13.0 - 17.0 g/dL   HCT 40.6 39.0 - 52.0 %   MCV 89.8 80.0 - 100.0 fL   MCH 29.9 26.0 - 34.0 pg   MCHC 33.3 30.0 - 36.0 g/dL   RDW 13.2 11.5 - 15.5 %   Platelets 242 150 - 400 K/uL   nRBC 0.0 0.0 - 0.2 %   Neutrophils Relative % 82 %   Neutro Abs 6.7 1.7 - 7.7 K/uL   Lymphocytes Relative 9 %   Lymphs Abs 0.7 0.7 - 4.0 K/uL   Monocytes Relative 6 %   Monocytes Absolute 0.5 0.1 - 1.0 K/uL   Eosinophils Relative 1 %   Eosinophils Absolute 0.1 0.0 - 0.5 K/uL   Basophils Relative 1 %    Basophils Absolute 0.0 0.0 - 0.1 K/uL   Immature Granulocytes 1 %   Abs Immature Granulocytes 0.05 0.00 - 0.07 K/uL    Comment: Performed at [facility], [address], [place],  [address]    DG Hand Complete Right  Result Date: 07/14/2021 CLINICAL DATA:  Right hand laceration from table saw EXAM: RIGHT HAND - COMPLETE 3+ VIEW COMPARISON:  10/19/2020 FINDINGS: Acute comminuted and displaced fracture through the distal phalanx of the right ring finger. Fracture extends intra-articularly to the distal interphalangeal joint. The head of the ring finger middle phalanx is also fractured. Overlying soft tissue swelling and irregularity compatible with open fracture. Minimally comminuted and displaced fracture involving the distal tuft of the small finger distal phalanx with overlying soft tissue irregularity. Similar changes of advanced distal-predominant arthropathy of the right hand. No retained  radiopaque foreign bodies are seen IMPRESSION: 1. Acute comminuted and displaced open fractures involving the distal phalanx and middle phalanx of the right ring finger with intra-articular extension to the DIP joint. 2. Acute minimally displaced open fracture of the distal tuft of the small finger distal phalanx. Electronically Signed   By: Davina Poke D.O.   On: 07/14/2021 15:33    Review of Systems  HENT:  Negative for ear discharge, ear pain, hearing loss and tinnitus.   Eyes:  Negative for photophobia and pain.  Respiratory:  Negative for cough and shortness of breath.   Cardiovascular:  Negative for chest pain.  Gastrointestinal:  Negative for abdominal pain, nausea and vomiting.  Genitourinary:  Negative for dysuria, flank pain, frequency and urgency.  Musculoskeletal:  Positive for arthralgias (Right ring/little fingers). Negative for back pain, myalgias and neck pain.  Neurological:  Negative for dizziness and headaches.  Hematological:  Does not bruise/bleed easily.   Psychiatric/Behavioral:  The patient is not nervous/anxious.   Blood pressure (!) 158/80, pulse 71, temperature 98.1 F (36.7 C), temperature source Oral, resp. rate 16, height 5\' 7"  (1.702 m), weight 63.5 kg, SpO2 99 %. Physical Exam Constitutional:      General: He is not in acute distress.    Appearance: He is well-developed. He is not diaphoretic.  HENT:     Head: Normocephalic and atraumatic.  Eyes:     General: No scleral icterus.       Right eye: No discharge.        Left eye: No discharge.     Conjunctiva/sclera: Conjunctivae normal.  Cardiovascular:     Rate and Rhythm: Normal rate and regular rhythm.  Pulmonary:     Effort: Pulmonary effort is normal. No respiratory distress.  Musculoskeletal:     Cervical back: Normal range of motion.     Comments: Right shoulder, elbow, wrist, digits- Longitudinal lacerations radial/ulnar aspects P2/3 of ring finger, no cap refill, minimal sensation, flex/ext 1/5, Transverse laceration little finger through nail bed, no instability, no blocks to motion  Sens  Ax/R/M/U intact  Mot   Ax/ R/ PIN/ M/ AIN/ U intact  Rad 2+  Skin:    General: Skin is warm and dry.  Neurological:     Mental Status: He is alert.  Psychiatric:        Mood and Affect: Mood normal.        Behavior: Behavior normal.    Assessment/Plan: Right hand injury -- Will need revision amputation right ring finger and nail bed repair little finger. Plan this evening by Dr. Grandville Silos. Please keep NPO. Anticipate discharge after surgery.    Lisette Abu, PA-C Orthopedic Surgery [phone] 07/14/2021, 4:07 PM    History and exam as above.  87 year old male with tablesaw injury to right ring and small fingers.  Small finger is more minor in degree, ring finger involve significant traumatic destruction of the DIP joint and bone loss.  I reviewed with him the options of revision amputation of the ring finger versus attempts at reconstruction, likely with skeletal  shortening and an attempt at primary osteosynthesis between the remaining portions of the distal phalanx and the middle phalanx.  I reviewed the pros and cons of each approach, and after careful consideration deliberation he indicated he like to proceed with revision amputation of the ring finger at the level of the neck of P2, with intention to increase the certainty of uneventful postoperative healing without the need for additional intervention.  We  will plan to proceed to the operating room with repair of the small finger and revision amputation of the ring finger through P2.  Consent was obtained and document executed

## 2021-07-14 NOTE — Progress Notes (Signed)
Unable to reach patient's wife via phone x multiple attempts. Pt states wife sits with a lady at a nursing facility in [place], multiple nursing facilities called. Unable to find where wife works. Pt states neighbor called EMS and wife is unable pt is in the hospital. Attempted to call patients children via white page searches. Unable to reach any family members.

## 2021-07-14 NOTE — Anesthesia Procedure Notes (Signed)
Procedure Name: MAC Date/Time: 07/14/2021 5:54 PM Performed by: Reece Agar, CRNA Pre-anesthesia Checklist: Patient identified, Emergency Drugs available, Suction available and Patient being monitored Patient Re-evaluated:Patient Re-evaluated prior to induction Oxygen Delivery Method: Simple face mask

## 2021-07-14 NOTE — ED Notes (Signed)
Pressure dressing applied to 4th and 5th fingers to right hand. Pt reports 3/10 pain scale. Instructed by RN to notify RN when pt increases. Pt is comfortable at this time. Will continue to monitor.

## 2021-07-15 ENCOUNTER — Other Ambulatory Visit: Payer: Self-pay | Admitting: Cardiology

## 2021-07-15 ENCOUNTER — Encounter (HOSPITAL_COMMUNITY): Payer: Self-pay | Admitting: Orthopedic Surgery

## 2021-07-22 ENCOUNTER — Encounter (HOSPITAL_COMMUNITY): Payer: Self-pay | Admitting: Orthopedic Surgery

## 2021-07-22 ENCOUNTER — Telehealth: Payer: Self-pay | Admitting: Cardiology

## 2021-07-22 MED ORDER — ESOMEPRAZOLE MAGNESIUM 20 MG PO CPDR: DELAYED_RELEASE_CAPSULE | ORAL | 0 refills | Status: DC

## 2021-07-22 NOTE — Telephone Encounter (Signed)
Pt c/o medication issue:  1. Name of Medication: esomeprazole (NEXIUM) 20 MG capsule  2. How are you currently taking this medication (dosage and times per day)? TAKE 1 CAPSULE BY MOUTH DAILY AT NOON.  3. Are you having a reaction (difficulty breathing--STAT)? No   4. What is your medication issue? Patient needs a new prescription sent to [facility] ([place], [place]

## 2021-07-31 ENCOUNTER — Other Ambulatory Visit: Payer: Self-pay | Admitting: Cardiology

## 2021-07-31 ENCOUNTER — Other Ambulatory Visit: Payer: Self-pay

## 2021-07-31 MED ORDER — ATORVASTATIN CALCIUM 20 MG PO TABS: 20.0000 mg | ORAL_TABLET | Freq: Every day | ORAL | 0 refills | Status: DC

## 2021-08-06 ENCOUNTER — Telehealth: Payer: Self-pay

## 2021-08-06 MED ORDER — ATORVASTATIN CALCIUM 20 MG PO TABS: 20.0000 mg | ORAL_TABLET | Freq: Every day | ORAL | 0 refills | Status: DC

## 2021-08-06 NOTE — Telephone Encounter (Signed)
Medication refill request for Lipitor 20 mg tablets approved and sent to [facility] per pt request. Pt has appt with Myles Gip, MD on 10/08/21 in the [place] office.  ?

## 2021-08-12 ENCOUNTER — Other Ambulatory Visit: Payer: Self-pay | Admitting: Cardiology

## 2021-08-12 ENCOUNTER — Telehealth: Payer: Self-pay | Admitting: Cardiology

## 2021-08-12 NOTE — Telephone Encounter (Signed)
Left message for [name] (pt's wife) to call office. ?

## 2021-08-12 NOTE — Telephone Encounter (Signed)
? ?  Pt c/o medication issue: ? ?1. Name of Medication: atorvastatin (LIPITOR) 20 MG tablet ? ?2. How are you currently taking this medication (dosage and times per day)? Take 1 tablet (20 mg total) by mouth daily. ? ?3. Are you having a reaction (difficulty breathing--STAT)?  ? ?4. What is your medication issue? Pt's wife calling, she said, Dr. Domenic Polite changed his lipitor from 20 mg to 40 mg. They need [facility] ?

## 2021-08-14 NOTE — Telephone Encounter (Signed)
Wife Billy [name]) states that Lipitor was increased to '40mg'$  daily by Dr. Domenic Polite at last [place].  I do not see this documented in that note.  Will fwd to provider for clarification. ?

## 2021-08-20 MED ORDER — ATORVASTATIN CALCIUM 40 MG PO TABS: 40.0000 mg | ORAL_TABLET | Freq: Every day | ORAL | Status: DC

## 2021-08-20 NOTE — Telephone Encounter (Signed)
Informed wife Billy [name]) that Dr. Pleas [name] increased his Lipitor to '40mg'$  on 05/06/2021.  Was sent to [facility] for 90 day supply with refills.  Confirmed with pcp office.  Local pharm is getting this rx ready for them.  Informed her to have [facility] send request to pcp office if she wants to get from mail order as pcp made this change.  She verbalized understanding.  ?

## 2021-09-04 DIAGNOSIS — H353130 Nonexudative age-related macular degeneration, bilateral, stage unspecified: Secondary | ICD-10-CM | POA: Diagnosis not present

## 2021-09-17 ENCOUNTER — Other Ambulatory Visit: Payer: Self-pay | Admitting: Cardiology

## 2021-09-22 ENCOUNTER — Other Ambulatory Visit: Payer: Self-pay | Admitting: Cardiology

## 2021-10-07 DIAGNOSIS — H35372 Puckering of macula, left eye: Secondary | ICD-10-CM | POA: Diagnosis not present

## 2021-10-07 DIAGNOSIS — H43813 Vitreous degeneration, bilateral: Secondary | ICD-10-CM | POA: Diagnosis not present

## 2021-10-07 DIAGNOSIS — H353132 Nonexudative age-related macular degeneration, bilateral, intermediate dry stage: Secondary | ICD-10-CM | POA: Diagnosis not present

## 2021-10-08 ENCOUNTER — Ambulatory Visit (INDEPENDENT_AMBULATORY_CARE_PROVIDER_SITE_OTHER): Payer: Medicare Other | Admitting: Cardiology

## 2021-10-08 ENCOUNTER — Encounter: Payer: Self-pay | Admitting: Cardiology

## 2021-10-08 VITALS — BP 138/72 | HR 75 | Ht 67.0 in | Wt 139.6 lb

## 2021-10-08 DIAGNOSIS — I25119 Atherosclerotic heart disease of native coronary artery with unspecified angina pectoris: Secondary | ICD-10-CM

## 2021-10-08 DIAGNOSIS — E782 Mixed hyperlipidemia: Secondary | ICD-10-CM

## 2021-10-08 NOTE — Patient Instructions (Signed)

## 2021-10-08 NOTE — Progress Notes (Signed)
? ? ?Cardiology Office Note ? ?Date: 10/08/2021  ? ?ID: Billy [name], DOB 1934-09-09, MRN [id number] ? ?PCP:  Curlene Labrum, MD  ?Cardiologist:  Rozann Lesches, MD ?Electrophysiologist:  None  ? ?Chief Complaint  ?Patient presents with  ? Cardiac follow-up  ? ? ?History of Present Illness: ?Billy [name] is an 87 y.o. male last seen in April 2022.  He is here with his wife for a routine visit.  Reports no accelerating angina, uses nitroglycerin occasionally with good results.  Still active with activities around the house and yard.  He planted a garden with tomatoes and peppers recently. ? ?I reviewed his current regimen.  He reports compliance with therapy.  Still following with Dr. Pleas Koch. ? ?I personally reviewed his ECG today which shows sinus rhythm with right bundle branch block and left anterior fascicular block. ? ?Past Medical History:  ?Diagnosis Date  ? Arthritis   ? Carotid artery disease ([place])   ? 97-98% LICA and 9-21% RICA - December 2013  ? Coronary atherosclerosis of native coronary artery   ? a. /p CABG in 2001 b. DESx2 to SVG-OM1 and staged DES to SVG-RCA in 04/2014 c. DES to SVG-PDA in 05/2015 d. DESx2 to SVG-OM1-OM2 in 04/2016 e. 06/2017: DESx2 to mid and distal body of SVG-RCA  ? Essential hypertension   ? GERD (gastroesophageal reflux disease)   ? Gout   ? History of blood transfusion 2017  ? HOH (hard of hearing)   ? Mixed hyperlipidemia   ? Myocardial infarction [facility]) 2017  ? Sinus bradycardia   ? Asymptomatic  ? Skin cancer   ? ? ?Past Surgical History:  ?Procedure Laterality Date  ? AMPUTATION Right 07/14/2021  ? Procedure: REVISION AMPUTATION RIGHT RING FINGER NAILBED REPAIR RIGHT LITTLE FINGER;  Surgeon: Milly Jakob, MD;  Location: [place];  Service: Orthopedics;  Laterality: Right;  ? APPENDECTOMY    ? Ruptured  ? CARDIAC CATHETERIZATION N/A 05/19/2015  ? Procedure: Left Heart Cath and Cors/Grafts Angiography;  Surgeon: Lorretta Harp, MD;  Location: [place] CV [facility];  Service:  Cardiovascular;  Laterality: N/A;  ? CARDIAC CATHETERIZATION N/A 05/19/2015  ? Procedure: Coronary Stent Intervention;  Surgeon: Lorretta Harp, MD;  Location: [place] [facility];  Service: Cardiovascular;  Laterality: N/A;  ? CARDIAC CATHETERIZATION N/A 04/23/2016  ? Procedure: LEFT HEART CATH AND CORS/GRAFTS ANGIOGRAPHY;  Surgeon: Leonie Man, MD;  Location: [place] [facility];  Service: Cardiovascular;  Laterality: N/A;  ? CARDIAC CATHETERIZATION N/A 04/23/2016  ? Procedure: Coronary Stent Intervention;  Surgeon: Leonie Man, MD;  Location: [place] [facility];  Service: Cardiovascular;  Laterality: N/A;  ? CATARACT EXTRACTION W/PHACO Right 11/13/2012  ? Procedure: CATARACT EXTRACTION PHACO AND INTRAOCULAR LENS PLACEMENT (IOC);  Surgeon: Tonny Branch, MD;  Location: [facility];  Service: Ophthalmology;  Laterality: Right;  CDE:12.75  ? CATARACT EXTRACTION W/PHACO Left 01/11/2013  ? Procedure: CATARACT EXTRACTION PHACO AND INTRAOCULAR LENS PLACEMENT (IOC);  Surgeon: Tonny Branch, MD;  Location: [facility];  Service: Ophthalmology;  Laterality: Left;  CDE: 21.13  ? COLONOSCOPY  09/2013  ? Dr. Burke Keels: colitis descending colon and sigmoid colon.Bx ?Cdiff vs ischemic.  ? CORONARY ANGIOPLASTY WITH STENT PLACEMENT  07/01/2017  ? CORONARY ARTERY BYPASS GRAFT  11/09/1999  ? LIMA to LAD, SVG to diagonal, SVG to OM1 and OM2, SVG to PDA  ? CORONARY STENT INTERVENTION N/A 07/01/2017  ? Procedure: CORONARY STENT INTERVENTION;  Surgeon: Burnell Blanks, MD;  Location:  [place] [facility];  Service: Cardiovascular;  Laterality: N/A;  ? ENTEROSCOPY N/A 12/26/2015  ? Procedure: ENTEROSCOPY;  Surgeon: Daneil Dolin, MD;  [facility];  Service: Endoscopy;  Laterality: N/A;  ? ESOPHAGOGASTRODUODENOSCOPY N/A 05/20/2015  ? Procedure: ESOPHAGOGASTRODUODENOSCOPY (EGD);  Surgeon: Wilford Corner, MD;  Location: [facility] ENDOSCOPY;  Service: Endoscopy;  Laterality: N/A;  ? ESOPHAGOGASTRODUODENOSCOPY N/A 12/26/2015  ? Procedure:  ESOPHAGOGASTRODUODENOSCOPY (EGD);  Surgeon: Daneil Dolin, MD;  [facility];  Service: Endoscopy;  Laterality: N/A;  ? GIVENS CAPSULE STUDY N/A 12/24/2015  ? Procedure: GIVENS CAPSULE STUDY;  Surgeon: Rogene Houston, MD;  [facility];  Service: Endoscopy;  Laterality: N/A;  ? LEFT HEART CATH AND CORS/GRAFTS ANGIOGRAPHY N/A 07/01/2017  ? Procedure: LEFT HEART CATH AND CORS/GRAFTS ANGIOGRAPHY;  Surgeon: Burnell Blanks, MD;  Location: [place] [facility];  Service: Cardiovascular;  Laterality: N/A;  ? LEFT HEART CATHETERIZATION WITH CORONARY ANGIOGRAM N/A 04/22/2014  ? Procedure: LEFT HEART CATHETERIZATION WITH CORONARY ANGIOGRAM;  Surgeon: Burnell Blanks, MD;  Location: [facility] C[facility] LAB;  Service: Cardiovascular;  Laterality: N/A;  ? PERCUTANEOUS CORONARY STENT INTERVENTION (PCI-S) N/A 04/25/2014  ? Procedure: PERCUTANEOUS CORONARY STENT INTERVENTION (PCI-S);  Surgeon: Peter M Martinique, MD;  Location: [facility] CATH LAB;  Service: Cardiovascular;  Laterality: N/A;  ? PERIPHERAL VASCULAR CATHETERIZATION  04/23/2016  ? Procedure: Thrombectomy;  Surgeon: Leonie Man, MD;  Location: [place] CV LAB;  Service: Cardiovascular;;  ? ? ?Current Outpatient Medications  ?Medication Sig Dispense Refill  ? acetaminophen (TYLENOL) 325 MG tablet Take 2 tablets (650 mg total) by mouth every 6 (six) hours.    ? aspirin 81 MG chewable tablet Chew 81 mg by mouth daily.    ? atorvastatin (LIPITOR) 40 MG tablet Take 1 tablet (40 mg total) by mouth daily.    ? clopidogrel (PLAVIX) 75 MG tablet TAKE 1 TABLET BY MOUTH  DAILY 90 tablet 3  ? COLCRYS 0.6 MG tablet Take 1 tablet by mouth Once daily as needed (for gout).     ? cyclobenzaprine (FLEXERIL) 10 MG tablet Take 1 tablet (10 mg total) by mouth 3 times/day as needed-between meals & bedtime for muscle spasms. (Patient taking differently: Take 10 mg by mouth every other day.) 30 tablet 0  ? esomeprazole (NEXIUM) 20 MG capsule TAKE 1 CAPSULE BY MOUTH DAILY AT  NOON 90 capsule 3  ? finasteride (PROSCAR) 5 MG tablet Take 5 mg by mouth daily.    ? fluticasone (FLONASE) 50 MCG/ACT nasal spray Place 1 spray into both nostrils as needed for allergies or rhinitis.    ? isosorbide mononitrate (IMDUR) 60 MG 24 hr tablet TAKE ONE-HALF TABLET BY  MOUTH DAILY 45 tablet 3  ? Magnesium 250 MG TABS Take 0.5 tablets by mouth daily.     ? metoprolol succinate (TOPROL-XL) 25 MG 24 hr tablet TAKE 1 TABLET BY MOUTH ONCE  DAILY 30 tablet 6  ? Multiple Vitamins-Minerals (PRESERVISION AREDS 2 PO) Take 1 tablet by mouth daily.    ? nitroGLYCERIN (NITROSTAT) 0.4 MG SL tablet DISSOLVE 1 TABLET UNDER THE TONGUE EVERY 5 MINUTES AS  NEEDED FOR CHEST PAIN. MAX  OF 3 TABLETS IN 15 MINUTES. CALL 911 IF PAIN PERSISTS. 100 tablet 3  ? Omega-3 Fatty Acids (FISH OIL) 1000 MG CAPS Take 1 capsule by mouth daily.    ? sennosides-docusate sodium (SENOKOT-S) 8.6-50 MG tablet Take 1 tablet by mouth daily.    ? tamsulosin (FLOMAX) 0.4 MG  CAPS capsule Take 0.4 mg by mouth at bedtime.    ? ?No current facility-administered medications for this visit.  ? ?Allergies:  Protonix [pantoprazole sodium]  ? ?ROS: No palpitations or syncope.  Chronic hearing loss. ? ?Physical Exam: ?VS:  BP 138/72   Pulse 75   Ht '5\' 7"'$  (1.702 m)   Wt 139 lb 9.6 oz (63.3 kg)   SpO2 96%   BMI 21.86 kg/m? , BMI Body mass index is 21.86 kg/m?. ? ?Wt Readings from Last 3 Encounters:  ?10/08/21 139 lb 9.6 oz (63.3 kg)  ?07/14/21 142 lb (64.4 kg)  ?10/19/20 139 lb 15.9 oz (63.5 kg)  ?  ?General: Patient appears comfortable at rest. ?HEENT: Conjunctiva and lids normal. ?Neck: Supple, no elevated JVP or carotid bruits, no thyromegaly. ?Lungs: Clear to auscultation, nonlabored breathing at rest. ?Cardiac: Regular rate and rhythm, no S3, 1/6 systolic murmur. ?Extremities: No pitting edema. ? ?ECG:  An ECG dated January 2022 was personally reviewed today and demonstrated:  Sinus rhythm with right bundle branch block, left anterior fascicular block,  frequent PVCs with burst of NSVT. ? ?Recent Labwork: ?07/14/2021: BUN 26; Creatinine, Ser 1.53; Hemoglobin 13.5; Platelets 242; Potassium 4.8; Sodium 139  ? ?Other Studies Reviewed Today: ? ?Echocardiogram 3/5/

## 2021-10-13 DIAGNOSIS — S68114A Complete traumatic metacarpophalangeal amputation of right ring finger, initial encounter: Secondary | ICD-10-CM | POA: Diagnosis not present

## 2021-10-13 DIAGNOSIS — S61206A Unspecified open wound of right little finger without damage to nail, initial encounter: Secondary | ICD-10-CM | POA: Diagnosis not present

## 2021-11-11 ENCOUNTER — Other Ambulatory Visit: Payer: Self-pay | Admitting: Cardiology

## 2021-11-14 ENCOUNTER — Other Ambulatory Visit: Payer: Self-pay | Admitting: Cardiology

## 2021-12-03 DIAGNOSIS — Z1329 Encounter for screening for other suspected endocrine disorder: Secondary | ICD-10-CM | POA: Diagnosis not present

## 2021-12-03 DIAGNOSIS — I214 Non-ST elevation (NSTEMI) myocardial infarction: Secondary | ICD-10-CM | POA: Diagnosis not present

## 2021-12-03 DIAGNOSIS — J449 Chronic obstructive pulmonary disease, unspecified: Secondary | ICD-10-CM | POA: Diagnosis not present

## 2021-12-03 DIAGNOSIS — I10 Essential (primary) hypertension: Secondary | ICD-10-CM | POA: Diagnosis not present

## 2021-12-03 DIAGNOSIS — D649 Anemia, unspecified: Secondary | ICD-10-CM | POA: Diagnosis not present

## 2021-12-03 DIAGNOSIS — E7849 Other hyperlipidemia: Secondary | ICD-10-CM | POA: Diagnosis not present

## 2021-12-03 DIAGNOSIS — E782 Mixed hyperlipidemia: Secondary | ICD-10-CM | POA: Diagnosis not present

## 2021-12-03 DIAGNOSIS — E559 Vitamin D deficiency, unspecified: Secondary | ICD-10-CM | POA: Diagnosis not present

## 2021-12-21 DIAGNOSIS — Z89021 Acquired absence of right finger(s): Secondary | ICD-10-CM | POA: Diagnosis not present

## 2021-12-21 DIAGNOSIS — I255 Ischemic cardiomyopathy: Secondary | ICD-10-CM | POA: Diagnosis not present

## 2021-12-21 DIAGNOSIS — D500 Iron deficiency anemia secondary to blood loss (chronic): Secondary | ICD-10-CM | POA: Diagnosis not present

## 2021-12-21 DIAGNOSIS — Z0001 Encounter for general adult medical examination with abnormal findings: Secondary | ICD-10-CM | POA: Diagnosis not present

## 2021-12-21 DIAGNOSIS — Z955 Presence of coronary angioplasty implant and graft: Secondary | ICD-10-CM | POA: Diagnosis not present

## 2021-12-21 DIAGNOSIS — M1A0721 Idiopathic chronic gout, left ankle and foot, with tophus (tophi): Secondary | ICD-10-CM | POA: Diagnosis not present

## 2021-12-21 DIAGNOSIS — I252 Old myocardial infarction: Secondary | ICD-10-CM | POA: Diagnosis not present

## 2021-12-21 DIAGNOSIS — I10 Essential (primary) hypertension: Secondary | ICD-10-CM | POA: Diagnosis not present

## 2021-12-21 DIAGNOSIS — I77811 Abdominal aortic ectasia: Secondary | ICD-10-CM | POA: Diagnosis not present

## 2021-12-21 DIAGNOSIS — Z23 Encounter for immunization: Secondary | ICD-10-CM | POA: Diagnosis not present

## 2021-12-21 DIAGNOSIS — I25812 Atherosclerosis of bypass graft of coronary artery of transplanted heart without angina pectoris: Secondary | ICD-10-CM | POA: Diagnosis not present

## 2021-12-21 DIAGNOSIS — E7849 Other hyperlipidemia: Secondary | ICD-10-CM | POA: Diagnosis not present

## 2021-12-29 ENCOUNTER — Other Ambulatory Visit: Payer: Self-pay | Admitting: Cardiology

## 2022-02-01 ENCOUNTER — Other Ambulatory Visit: Payer: Self-pay | Admitting: *Deleted

## 2022-02-01 ENCOUNTER — Encounter: Payer: Self-pay | Admitting: *Deleted

## 2022-02-01 NOTE — Patient Outreach (Signed)
  Care Coordination   Initial Visit Note   02/01/2022 Name: Billy [name] MRN: [id number] DOB: 02-Jan-1935  Billy [name] is a 87 y.o. year old male who sees Burdine, Virgina Evener, MD for primary care. I spoke with Wife of  Billy [name] by phone today.  What matters to the patients health and wellness today?  Report all chronic conditions are stable at this time, denies any need for further education.     Goals Addressed             This Visit's Progress    COMPLETED: Care Coordination Activities - No follow up needed       Care Coordination Interventions: Provided education on importance of blood pressure control in management of CAD Provided education on Importance of limiting foods high in cholesterol Reviewed Importance of taking all medications as prescribed Reviewed Importance of attending all scheduled provider appointments Advised patient to discuss need for AWV with provider Assessed social determinant of health barriers         SDOH assessments and interventions completed:  Yes  SDOH Interventions Today    Flowsheet Row Most Recent Value  SDOH Interventions   Food Insecurity Interventions Intervention Not Indicated  Housing Interventions Intervention Not Indicated  Transportation Interventions Intervention Not Indicated        Care Coordination Interventions Activated:  Yes  Care Coordination Interventions:  Yes, provided   Follow up plan: No further intervention required.   Encounter Outcome:  Pt. Visit Completed   Valente David, RN, MSN, [place] Care Management Care Management Coordinator [phone]

## 2022-02-01 NOTE — Patient Instructions (Signed)
Visit Information  Thank you for taking time to visit with me today. Please don't hesitate to contact me if I can be of assistance to you   Following are the goals we discussed today:  Schedule AWV with PCP  Please call the Suicide and Crisis Lifeline: 988 call the [place] National Suicide Prevention Lifeline: 204-186-2293 or TTY: 747-533-3755 TTY (732)789-0272) to talk to a trained counselor call 1-800-273-TALK (toll free, 24 hour hotline) call the [facility]: (812)207-3798 call 911 if you are experiencing a Mental Health or [place] or need someone to talk to.  The patient verbalized understanding of instructions, educational materials, and care plan provided today and agreed to receive a mailed copy of patient instructions, educational materials, and care plan.   The patient has been provided with contact information for the care management team and has been advised to call with any health related questions or concerns.   Valente David, RN, MSN, [place] Care Management Care Management Coordinator [phone]

## 2022-03-19 DIAGNOSIS — H353122 Nonexudative age-related macular degeneration, left eye, intermediate dry stage: Secondary | ICD-10-CM | POA: Diagnosis not present

## 2022-03-30 ENCOUNTER — Other Ambulatory Visit: Payer: Self-pay | Admitting: Cardiology

## 2022-03-31 DIAGNOSIS — H905 Unspecified sensorineural hearing loss: Secondary | ICD-10-CM | POA: Diagnosis not present

## 2022-04-20 ENCOUNTER — Ambulatory Visit: Payer: Medicare Other | Admitting: Cardiology

## 2022-04-26 ENCOUNTER — Telehealth: Payer: Self-pay | Admitting: Cardiology

## 2022-04-26 DIAGNOSIS — K558 Other vascular disorders of intestine: Secondary | ICD-10-CM | POA: Diagnosis not present

## 2022-04-26 DIAGNOSIS — I2489 Other forms of acute ischemic heart disease: Secondary | ICD-10-CM | POA: Diagnosis not present

## 2022-04-26 DIAGNOSIS — Z9049 Acquired absence of other specified parts of digestive tract: Secondary | ICD-10-CM | POA: Diagnosis not present

## 2022-04-26 DIAGNOSIS — K921 Melena: Secondary | ICD-10-CM | POA: Diagnosis not present

## 2022-04-26 DIAGNOSIS — K5289 Other specified noninfective gastroenteritis and colitis: Secondary | ICD-10-CM | POA: Diagnosis not present

## 2022-04-26 DIAGNOSIS — K922 Gastrointestinal hemorrhage, unspecified: Secondary | ICD-10-CM | POA: Diagnosis not present

## 2022-04-26 DIAGNOSIS — Z955 Presence of coronary angioplasty implant and graft: Secondary | ICD-10-CM | POA: Diagnosis not present

## 2022-04-26 DIAGNOSIS — D62 Acute posthemorrhagic anemia: Secondary | ICD-10-CM | POA: Diagnosis not present

## 2022-04-26 DIAGNOSIS — I10 Essential (primary) hypertension: Secondary | ICD-10-CM | POA: Diagnosis not present

## 2022-04-26 DIAGNOSIS — Z7902 Long term (current) use of antithrombotics/antiplatelets: Secondary | ICD-10-CM | POA: Diagnosis not present

## 2022-04-26 DIAGNOSIS — K529 Noninfective gastroenteritis and colitis, unspecified: Secondary | ICD-10-CM | POA: Diagnosis not present

## 2022-04-26 DIAGNOSIS — I2510 Atherosclerotic heart disease of native coronary artery without angina pectoris: Secondary | ICD-10-CM | POA: Diagnosis not present

## 2022-04-26 DIAGNOSIS — I4510 Unspecified right bundle-branch block: Secondary | ICD-10-CM | POA: Diagnosis not present

## 2022-04-26 DIAGNOSIS — I7102 Dissection of abdominal aorta: Secondary | ICD-10-CM | POA: Diagnosis not present

## 2022-04-26 DIAGNOSIS — K5900 Constipation, unspecified: Secondary | ICD-10-CM | POA: Diagnosis not present

## 2022-04-26 DIAGNOSIS — K625 Hemorrhage of anus and rectum: Secondary | ICD-10-CM | POA: Diagnosis not present

## 2022-04-26 DIAGNOSIS — M25522 Pain in left elbow: Secondary | ICD-10-CM | POA: Diagnosis not present

## 2022-04-26 DIAGNOSIS — Z7982 Long term (current) use of aspirin: Secondary | ICD-10-CM | POA: Diagnosis not present

## 2022-04-26 DIAGNOSIS — M7989 Other specified soft tissue disorders: Secondary | ICD-10-CM | POA: Diagnosis not present

## 2022-04-26 DIAGNOSIS — I700 Atherosclerosis of aorta: Secondary | ICD-10-CM | POA: Diagnosis not present

## 2022-04-26 DIAGNOSIS — R55 Syncope and collapse: Secondary | ICD-10-CM | POA: Diagnosis not present

## 2022-04-26 DIAGNOSIS — K559 Vascular disorder of intestine, unspecified: Secondary | ICD-10-CM | POA: Diagnosis not present

## 2022-04-26 DIAGNOSIS — K6389 Other specified diseases of intestine: Secondary | ICD-10-CM | POA: Diagnosis not present

## 2022-04-26 DIAGNOSIS — Z951 Presence of aortocoronary bypass graft: Secondary | ICD-10-CM | POA: Diagnosis not present

## 2022-04-26 DIAGNOSIS — R0602 Shortness of breath: Secondary | ICD-10-CM | POA: Diagnosis not present

## 2022-04-26 DIAGNOSIS — K51511 Left sided colitis with rectal bleeding: Secondary | ICD-10-CM | POA: Diagnosis not present

## 2022-04-26 DIAGNOSIS — Z79899 Other long term (current) drug therapy: Secondary | ICD-10-CM | POA: Diagnosis not present

## 2022-04-26 DIAGNOSIS — D649 Anemia, unspecified: Secondary | ICD-10-CM | POA: Diagnosis not present

## 2022-04-26 NOTE — Telephone Encounter (Signed)
Pt c/o Syncope: STAT if syncope occurred within 30 minutes and pt complains of lightheadedness High Priority if episode of passing out, completely, today or in last 24 hours   Did you pass out today?  No  When is the last time you passed out?  Yesterday around 4 P.M.   Has this occurred multiple times? No    Did you have any symptoms prior to passing out? No, he was trying to go to the bathroom. Requesting call back as soon as possible.

## 2022-04-26 NOTE — Telephone Encounter (Signed)
Spoke with wife who says patient almost passed out twice after straining while trying to have a BM on yesterday. EMS contacted yesterday and all vitals were fine and EKG was okay except skipping a beat. BP or HR has not been checked today. Reports this morning while having BM x's 2 had a lot of blood in toilet. Denies dizziness, chest pain or SOB. Denies blurred vision, fatigue or lightheadedness. Advised to contact PCP office for an evaluation. Wife says she called PCP first and she was directed to contact cardiologist. Advised that she needed to contact PCP again for a visit or go to the ED. Verbalized understanding of plan.

## 2022-04-27 DIAGNOSIS — Z9049 Acquired absence of other specified parts of digestive tract: Secondary | ICD-10-CM | POA: Diagnosis not present

## 2022-04-27 DIAGNOSIS — K51511 Left sided colitis with rectal bleeding: Secondary | ICD-10-CM | POA: Diagnosis not present

## 2022-04-27 DIAGNOSIS — K558 Other vascular disorders of intestine: Secondary | ICD-10-CM | POA: Diagnosis not present

## 2022-04-27 DIAGNOSIS — Z955 Presence of coronary angioplasty implant and graft: Secondary | ICD-10-CM | POA: Diagnosis not present

## 2022-04-27 DIAGNOSIS — K5900 Constipation, unspecified: Secondary | ICD-10-CM | POA: Diagnosis not present

## 2022-04-27 DIAGNOSIS — Z951 Presence of aortocoronary bypass graft: Secondary | ICD-10-CM | POA: Diagnosis not present

## 2022-04-27 DIAGNOSIS — I4510 Unspecified right bundle-branch block: Secondary | ICD-10-CM | POA: Diagnosis not present

## 2022-04-27 DIAGNOSIS — D62 Acute posthemorrhagic anemia: Secondary | ICD-10-CM | POA: Diagnosis not present

## 2022-04-27 DIAGNOSIS — K625 Hemorrhage of anus and rectum: Secondary | ICD-10-CM | POA: Diagnosis not present

## 2022-04-27 DIAGNOSIS — I10 Essential (primary) hypertension: Secondary | ICD-10-CM | POA: Diagnosis not present

## 2022-04-27 DIAGNOSIS — Z7902 Long term (current) use of antithrombotics/antiplatelets: Secondary | ICD-10-CM | POA: Diagnosis not present

## 2022-04-27 DIAGNOSIS — Z7982 Long term (current) use of aspirin: Secondary | ICD-10-CM | POA: Diagnosis not present

## 2022-04-27 DIAGNOSIS — I2510 Atherosclerotic heart disease of native coronary artery without angina pectoris: Secondary | ICD-10-CM | POA: Diagnosis not present

## 2022-04-27 DIAGNOSIS — I700 Atherosclerosis of aorta: Secondary | ICD-10-CM | POA: Diagnosis not present

## 2022-04-27 DIAGNOSIS — Z79899 Other long term (current) drug therapy: Secondary | ICD-10-CM | POA: Diagnosis not present

## 2022-04-27 DIAGNOSIS — M25522 Pain in left elbow: Secondary | ICD-10-CM | POA: Diagnosis not present

## 2022-04-28 DIAGNOSIS — K625 Hemorrhage of anus and rectum: Secondary | ICD-10-CM | POA: Diagnosis not present

## 2022-04-28 DIAGNOSIS — Z7982 Long term (current) use of aspirin: Secondary | ICD-10-CM | POA: Diagnosis not present

## 2022-04-28 DIAGNOSIS — Z955 Presence of coronary angioplasty implant and graft: Secondary | ICD-10-CM | POA: Diagnosis not present

## 2022-04-28 DIAGNOSIS — Z79899 Other long term (current) drug therapy: Secondary | ICD-10-CM | POA: Diagnosis not present

## 2022-04-28 DIAGNOSIS — Z951 Presence of aortocoronary bypass graft: Secondary | ICD-10-CM | POA: Diagnosis not present

## 2022-04-28 DIAGNOSIS — K51511 Left sided colitis with rectal bleeding: Secondary | ICD-10-CM | POA: Diagnosis not present

## 2022-04-28 DIAGNOSIS — I10 Essential (primary) hypertension: Secondary | ICD-10-CM | POA: Diagnosis not present

## 2022-04-28 DIAGNOSIS — K558 Other vascular disorders of intestine: Secondary | ICD-10-CM | POA: Diagnosis not present

## 2022-04-28 DIAGNOSIS — Z9049 Acquired absence of other specified parts of digestive tract: Secondary | ICD-10-CM | POA: Diagnosis not present

## 2022-04-28 DIAGNOSIS — I700 Atherosclerosis of aorta: Secondary | ICD-10-CM | POA: Diagnosis not present

## 2022-04-28 DIAGNOSIS — K559 Vascular disorder of intestine, unspecified: Secondary | ICD-10-CM | POA: Diagnosis not present

## 2022-04-28 DIAGNOSIS — I2510 Atherosclerotic heart disease of native coronary artery without angina pectoris: Secondary | ICD-10-CM | POA: Diagnosis not present

## 2022-04-28 DIAGNOSIS — D62 Acute posthemorrhagic anemia: Secondary | ICD-10-CM | POA: Diagnosis not present

## 2022-04-28 DIAGNOSIS — I4510 Unspecified right bundle-branch block: Secondary | ICD-10-CM | POA: Diagnosis not present

## 2022-04-28 DIAGNOSIS — Z7902 Long term (current) use of antithrombotics/antiplatelets: Secondary | ICD-10-CM | POA: Diagnosis not present

## 2022-04-28 DIAGNOSIS — M25522 Pain in left elbow: Secondary | ICD-10-CM | POA: Diagnosis not present

## 2022-04-29 DIAGNOSIS — I4510 Unspecified right bundle-branch block: Secondary | ICD-10-CM | POA: Diagnosis not present

## 2022-04-29 DIAGNOSIS — Z955 Presence of coronary angioplasty implant and graft: Secondary | ICD-10-CM | POA: Diagnosis not present

## 2022-04-29 DIAGNOSIS — I700 Atherosclerosis of aorta: Secondary | ICD-10-CM | POA: Diagnosis not present

## 2022-04-29 DIAGNOSIS — D62 Acute posthemorrhagic anemia: Secondary | ICD-10-CM | POA: Diagnosis not present

## 2022-04-29 DIAGNOSIS — Z79899 Other long term (current) drug therapy: Secondary | ICD-10-CM | POA: Diagnosis not present

## 2022-04-29 DIAGNOSIS — Z9049 Acquired absence of other specified parts of digestive tract: Secondary | ICD-10-CM | POA: Diagnosis not present

## 2022-04-29 DIAGNOSIS — K558 Other vascular disorders of intestine: Secondary | ICD-10-CM | POA: Diagnosis not present

## 2022-04-29 DIAGNOSIS — I2510 Atherosclerotic heart disease of native coronary artery without angina pectoris: Secondary | ICD-10-CM | POA: Diagnosis not present

## 2022-04-29 DIAGNOSIS — Z951 Presence of aortocoronary bypass graft: Secondary | ICD-10-CM | POA: Diagnosis not present

## 2022-04-29 DIAGNOSIS — Z7982 Long term (current) use of aspirin: Secondary | ICD-10-CM | POA: Diagnosis not present

## 2022-04-29 DIAGNOSIS — Z7902 Long term (current) use of antithrombotics/antiplatelets: Secondary | ICD-10-CM | POA: Diagnosis not present

## 2022-04-29 DIAGNOSIS — M25522 Pain in left elbow: Secondary | ICD-10-CM | POA: Diagnosis not present

## 2022-04-29 DIAGNOSIS — I10 Essential (primary) hypertension: Secondary | ICD-10-CM | POA: Diagnosis not present

## 2022-05-06 DIAGNOSIS — K559 Vascular disorder of intestine, unspecified: Secondary | ICD-10-CM | POA: Diagnosis not present

## 2022-05-06 DIAGNOSIS — M7022 Olecranon bursitis, left elbow: Secondary | ICD-10-CM | POA: Diagnosis not present

## 2022-05-06 DIAGNOSIS — I255 Ischemic cardiomyopathy: Secondary | ICD-10-CM | POA: Diagnosis not present

## 2022-05-06 DIAGNOSIS — I25812 Atherosclerosis of bypass graft of coronary artery of transplanted heart without angina pectoris: Secondary | ICD-10-CM | POA: Diagnosis not present

## 2022-05-06 DIAGNOSIS — M19032 Primary osteoarthritis, left wrist: Secondary | ICD-10-CM | POA: Diagnosis not present

## 2022-05-06 DIAGNOSIS — D62 Acute posthemorrhagic anemia: Secondary | ICD-10-CM | POA: Diagnosis not present

## 2022-05-06 DIAGNOSIS — M25432 Effusion, left wrist: Secondary | ICD-10-CM | POA: Diagnosis not present

## 2022-05-06 DIAGNOSIS — R55 Syncope and collapse: Secondary | ICD-10-CM | POA: Diagnosis not present

## 2022-05-11 ENCOUNTER — Telehealth: Payer: Self-pay | Admitting: Cardiology

## 2022-05-11 NOTE — Telephone Encounter (Signed)
Pt's wife is returning call. Transferred to [place], Therapist, sports.

## 2022-05-11 NOTE — Telephone Encounter (Signed)
  Pt c/o medication issue:  1. Name of Medication:   2. How are you currently taking this medication (dosage and times per day)?   3. Are you having a reaction (difficulty breathing--STAT)?   4. What is your medication issue? Pt recently admitted at [facility] for lower GI bleeding, ischemic colitis - he still on ASA/plavix for CAD, stent. Does he need to stay on DAPT DATT at this point or would it be appropriate to de-escalate that treatment regimen? [phone]

## 2022-05-11 NOTE — Telephone Encounter (Signed)
Patient's wife Shyhiem Beeney informed that patient can stop plavix and continue aspirin 81 mg daily.Verbalized understanding of plan. Copy sent to PCP

## 2022-05-26 ENCOUNTER — Telehealth: Payer: Self-pay | Admitting: Cardiology

## 2022-05-26 NOTE — Telephone Encounter (Signed)
Wife states that the patient heart is messing up, that the patient been burping a lot lately. She took him to the ER but there was 2 hr wait. Wife looking to get patient seen today, explain there is no availability till tomorrow. Please advise

## 2022-05-27 NOTE — Telephone Encounter (Signed)
Looks like OV has been scheduled for tomorrow with Annita Brod, Np

## 2022-05-28 ENCOUNTER — Encounter: Payer: Self-pay | Admitting: Nurse Practitioner

## 2022-05-28 ENCOUNTER — Ambulatory Visit: Payer: Medicare Other | Attending: Nurse Practitioner | Admitting: Nurse Practitioner

## 2022-05-28 VITALS — BP 132/70 | HR 69 | Ht 67.5 in | Wt 138.0 lb

## 2022-05-28 DIAGNOSIS — I2510 Atherosclerotic heart disease of native coronary artery without angina pectoris: Secondary | ICD-10-CM

## 2022-05-28 DIAGNOSIS — R011 Cardiac murmur, unspecified: Secondary | ICD-10-CM | POA: Diagnosis not present

## 2022-05-28 DIAGNOSIS — I779 Disorder of arteries and arterioles, unspecified: Secondary | ICD-10-CM | POA: Diagnosis not present

## 2022-05-28 DIAGNOSIS — E785 Hyperlipidemia, unspecified: Secondary | ICD-10-CM

## 2022-05-28 DIAGNOSIS — I10 Essential (primary) hypertension: Secondary | ICD-10-CM | POA: Diagnosis not present

## 2022-05-28 DIAGNOSIS — I255 Ischemic cardiomyopathy: Secondary | ICD-10-CM | POA: Diagnosis not present

## 2022-05-28 DIAGNOSIS — R55 Syncope and collapse: Secondary | ICD-10-CM

## 2022-05-28 DIAGNOSIS — I25119 Atherosclerotic heart disease of native coronary artery with unspecified angina pectoris: Secondary | ICD-10-CM

## 2022-05-28 DIAGNOSIS — I38 Endocarditis, valve unspecified: Secondary | ICD-10-CM

## 2022-05-28 NOTE — Patient Instructions (Addendum)
Medication Instructions:  Continue all current medications.  Labwork: none  Testing/Procedures: Your physician has requested that you have a carotid duplex. This test is an ultrasound of the carotid arteries in your neck. It looks at blood flow through these arteries that supply the brain with blood. Allow one hour for this exam. There are no restrictions or special instructions. Your physician has requested that you have an echocardiogram. Echocardiography is a painless test that uses sound waves to create images of your heart. It provides your doctor with information about the size and shape of your heart and how well your heart's chambers and valves are working. This procedure takes approximately one hour. There are no restrictions for this procedure. Please do NOT wear cologne, perfume, aftershave, or lotions (deodorant is allowed). Please arrive 15 minutes prior to your appointment time. Office will contact with results via phone, letter or mychart.     Follow-Up: 1 month   Any Other Special Instructions Will Be Listed Below (If Applicable). Your physician has requested that you regularly monitor and record your blood pressure readings at home. Please use the same machine at the same time of day to check your readings and record them to bring to your follow-up visit.  If you need a refill on your cardiac medications before your next appointment, please call your pharmacy.

## 2022-05-28 NOTE — Progress Notes (Unsigned)
Cardiology Office Note:    Date:  05/28/2022  ID:  Billy [name], DOB 1935/05/23, MRN [id number]  PCP:  Curlene Labrum, MD   [facility] Prov[facility] Cardiologist:  Rozann Lesches, MD { Click to update primary MD,subspecialty MD or APP then REFRESH:1}    Referring MD: Curlene Labrum, MD   No chief complaint on file. ***  History of Present Illness:    Billy [name] is a 87 y.o. male with a hx of ***  Carotid artery disease Coronary artery disease, status post NSTEMI Hypertension History of melena CKD stage III Hyperlipidemia Bradycardia cardia prediabetes History of syncope , Symptomatic anemia, blood loss anemia, history of iron deficiency anemia History of chest pain  Patient is a 87 year old male with past medical history as mentioned above.  Previous cardiovascular history includes admission to [facility] in 2019 for cardiac catheterization in setting of unstable angina.  Heart catheterization revealed severe triple-vessel CAD.  Vein graft to RCA had moderately severe stenosis and mid body of graft, distal stented segment of graft had 99% stenosis, treated with PTCA/DES x 2 to mid and distal body of SVG-RCA.  EF minimally reduced to 40%.  Started on DAPT with aspirin and Plavix.  Was started on losartan due to his history of cardiomyopathy.  Repeat echocardiogram in March 2019 revealed mildly reduced EF at 40 to 45% with akinesis of inferolateral and inferior myocardium, normal diastolic function.  Echo in 2020 continue to show stable EF around 45% with inferior and inferolateral wall akinesis.  Last seen by Dr. Domenic Polite on Oct 08, 2021.  EKG in office that day revealed sinus rhythm, RBBB, LAFB.  Was following Dr. Pleas Koch.  Very active with doing housework and gardening.  Denied any chest pain.  Was overall doing very well.  Was told to follow-up in 6 months.  He contacted our office reporting a syncopal episode.  Wife who spoke with our nurse stated  that he almost passed out twice after straining while having a bowel movement in November 2023.  EMS evaluated him, all his vital signs were good, EKG was okay except showing a skipped beat.  Wife also reported that morning that he had a lot of blood in the toilet while having a bowel movement.  Denied chest pain, dizziness, shortness of breath, fatigue, blurry vision, or lightheadedness.  He was advised to contact his PCP for evaluation, however wife stated she did contact PCP who directed her to contact cardiology.  She was advised to contact PCP again or go visit the ED.  Given his GI bleed and ischemic colitis that was found after telephone call, there was a question regarding continuing Plavix.  Dr. Domenic Polite was consulted.  It was advised that patient could stop Plavix and continue aspirin on May 11, 2022.  Our office was contacted 2 days ago by patient's wife.  His wife stated that the patient heart was messing up, patient had been belching frequently.  He was taken to the ER and there was a 2-hour wait however patient would like to be seen sooner.  He was set up for office visit today.  Today he presents for follow-up evaluation.  He states.      4-5 days ago. Neck hurting. 2 minutes, neck hurting 2 days. Presyncope, sat on couch for 10 minutes. Everyback normal. Bp - [employer] to get done.   Vision changed.    Vertigo room spinning, shob with episode.  Not eating nothing that day.  Near syncope. Carotid.   Echocardiogram.    Murmur, radiation to neck  Past Medical History:  Diagnosis Date   Arthritis    Carotid artery disease ([place])    86-57% LICA and 8-46% RICA - December 2013   Coronary atherosclerosis of native coronary artery    a. /p CABG in 2001 b. DESx2 to SVG-OM1 and staged DES to SVG-RCA in 04/2014 c. DES to SVG-PDA in 05/2015 d. DESx2 to SVG-OM1-OM2 in 04/2016 e. 06/2017: DESx2 to mid and distal body of SVG-RCA   Essential hypertension    GERD (gastroesophageal  reflux disease)    Gout    History of blood transfusion 2017   HOH (hard of hearing)    Mixed hyperlipidemia    Myocardial infarction [facility]) 2017   Sinus bradycardia    Asymptomatic   Skin cancer     Past Surgical History:  Procedure Laterality Date   AMPUTATION Right 07/14/2021   Procedure: REVISION AMPUTATION RIGHT RING FINGER NAILBED REPAIR RIGHT LITTLE FINGER;  Surgeon: Milly Jakob, MD;  Location: [place];  Service: Orthopedics;  Laterality: Right;   APPENDECTOMY     Ruptured   CARDIAC CATHETERIZATION N/A 05/19/2015   Procedure: Left Heart Cath and Cors/Grafts Angiography;  Surgeon: Lorretta Harp, MD;  Location: [place] [facility];  Service: Cardiovascular;  Laterality: N/A;   CARDIAC CATHETERIZATION N/A 05/19/2015   Procedure: Coronary Stent Intervention;  Surgeon: Lorretta Harp, MD;  Location: [place] [facility];  Service: Cardiovascular;  Laterality: N/A;   CARDIAC CATHETERIZATION N/A 04/23/2016   Procedure: LEFT HEART CATH AND CORS/GRAFTS ANGIOGRAPHY;  Surgeon: Leonie Man, MD;  Location: [place] [facility];  Service: Cardiovascular;  Laterality: N/A;   CARDIAC CATHETERIZATION N/A 04/23/2016   Procedure: Coronary Stent Intervention;  Surgeon: Leonie Man, MD;  Location: [place] [facility];  Service: Cardiovascular;  Laterality: N/A;   CATARACT EXTRACTION W/PHACO Right 11/13/2012   Procedure: CATARACT EXTRACTION PHACO AND INTRAOCULAR LENS PLACEMENT (IOC);  Surgeon: Tonny Branch, MD;  Location: [facility];  Service: Ophthalmology;  Laterality: Right;  CDE:12.75   CATARACT EXTRACTION W/PHACO Left 01/11/2013   Procedure: CATARACT EXTRACTION PHACO AND INTRAOCULAR LENS PLACEMENT (IOC);  Surgeon: Tonny Branch, MD;  Location: [facility];  Service: Ophthalmology;  Laterality: Left;  CDE: 21.13   COLONOSCOPY  09/2013   Dr. Burke Keels: colitis descending colon and sigmoid colon.Bx ?Cdiff vs ischemic.   CORONARY ANGIOPLASTY WITH STENT PLACEMENT  07/01/2017   CORONARY ARTERY BYPASS GRAFT   11/09/1999   LIMA to LAD, SVG to diagonal, SVG to OM1 and OM2, SVG to PDA   CORONARY STENT INTERVENTION N/A 07/01/2017   Procedure: CORONARY STENT INTERVENTION;  Surgeon: Burnell Blanks, MD;  Location: [place] [facility];  Service: Cardiovascular;  Laterality: N/A;   ENTEROSCOPY N/A 12/26/2015   Procedure: ENTEROSCOPY;  Surgeon: Daneil Dolin, MD;  [facility];  Service: Endoscopy;  Laterality: N/A;   ESOPHAGOGASTRODUODENOSCOPY N/A 05/20/2015   Procedure: ESOPHAGOGASTRODUODENOSCOPY (EGD);  Surgeon: Wilford Corner, MD;  Location: [facility];  Service: Endoscopy;  Laterality: N/A;   ESOPHAGOGASTRODUODENOSCOPY N/A 12/26/2015   Procedure: ESOPHAGOGASTRODUODENOSCOPY (EGD);  Surgeon: Daneil Dolin, MD;  [facility];  Service: Endoscopy;  Laterality: N/A;   GIVENS CAPSULE STUDY N/A 12/24/2015   Procedure: GIVENS CAPSULE STUDY;  Surgeon: Rogene Houston, MD;  [facility];  Service: Endoscopy;  Laterality: N/A;   LEFT HEART CATH AND CORS/GRAFTS ANGIOGRAPHY N/A 07/01/2017   Procedure: LEFT HEART  CATH AND CORS/GRAFTS ANGIOGRAPHY;  Surgeon: Burnell Blanks, MD;  Location: [place] [facility];  Service: Cardiovascular;  Laterality: N/A;   LEFT HEART CATHETERIZATION WITH CORONARY ANGIOGRAM N/A 04/22/2014   Procedure: LEFT HEART CATHETERIZATION WITH CORONARY ANGIOGRAM;  Surgeon: Burnell Blanks, MD;  Location: [facility] LAB;  Service: Cardiovascular;  Laterality: N/A;   PERCUTANEOUS CORONARY STENT INTERVENTION (PCI-S) N/A 04/25/2014   Procedure: PERCUTANEOUS CORONARY STENT INTERVENTION (PCI-S);  Surgeon: Peter M Martinique, MD;  Location: [facility] C[facility] LAB;  Service: Cardiovascular;  Laterality: N/A;   PERIPHERAL VASCULAR CATHETERIZATION  04/23/2016   Procedure: Thrombectomy;  Surgeon: Leonie Man, MD;  Location: [place] [facility];  Service: Cardiovascular;;    Current Medications: No outpatient medications have been marked as taking for the 05/28/22  encounter (Appointment) with Finis Bud, NP.     Allergies:   Protonix [pantoprazole sodium]   Social History   Socioeconomic History   Marital status: Married    Spouse name: Not on file   Number of children: Not on file   Years of education: Not on file   Highest education level: Not on file  Occupational History   Occupation: DISABLED    Comment: [employer]  Tobacco Use   Smoking status: Former    Packs/day: 0.50    Years: 50.00    Total pack years: 25.00    Types: Cigarettes    Start date: 01/08/1947    Quit date: 06/08/1999    Years since quitting: 22.9   Smokeless tobacco: Never  Vaping Use   Vaping Use: Never used  Substance and Sexual Activity   Alcohol use: No    Alcohol/week: 0.0 standard drinks of alcohol    Comment: 07/01/2017 "used to drink beer; quit in 2001"   Drug use: No   Sexual activity: Not on file  Other Topics Concern   Not on file  Social History Narrative   He lives in [place] with his wife.   Social Determinants of Health   Financial Resource Strain: Not on file  Food Insecurity: No Food Insecurity (02/01/2022)   Hunger Vital Sign    Worried About Running Out of Food in the Last Year: Never true    Ran Out of Food in the Last Year: Never true  Transportation Needs: No Transportation Needs (02/01/2022)   PRAPARE - Hydrologist (Medical): No    Lack of Transportation (Non-Medical): No  Physical Activity: Not on file  Stress: Not on file  Social Connections: Not on file     Family History: The patient's ***family history includes Cancer in his father.  ROS:   Please see the history of present illness.    *** All other systems reviewed and are negative.  EKGs/Labs/Other Studies Reviewed:    The following studies were reviewed today: ***  EKG:  EKG is ordered today.  The ekg ordered today demonstrates NSR, 73 bpm, RBBB, LAFB, with nonspecific ST segment changes.   Recent  Labs: 07/14/2021: BUN 26; Creatinine, Ser 1.53; Hemoglobin 13.5; Platelets 242; Potassium 4.8; Sodium 139  Recent Lipid Panel    Component Value Date/Time   CHOL 121 04/23/2016 0346   TRIG 117 04/23/2016 0346   HDL 22 (L) 04/23/2016 0346   CHOLHDL 5.5 04/23/2016 0346   VLDL 23 04/23/2016 0346   LDLCALC 76 04/23/2016 0346     Risk Assessment/Calculations:   {Does this patient have ATRIAL FIBRILLATION?:[id number]}  No BP recorded.  {Refresh Note OR  Click here to enter BP  :1}***         Physical Exam:    VS:  There were no vitals taken for this visit.    Wt Readings from Last 3 Encounters:  10/08/21 139 lb 9.6 oz (63.3 kg)  07/14/21 142 lb (64.4 kg)  10/19/20 139 lb 15.9 oz (63.5 kg)     GEN: *** Well nourished, well developed in no acute distress HEENT: Normal NECK: No JVD; No carotid bruits LYMPHATICS: No lymphadenopathy CARDIAC: ***RRR, no murmurs, rubs, gallops RESPIRATORY:  Clear to auscultation without rales, wheezing or rhonchi  ABDOMEN: Soft, non-tender, non-distended MUSCULOSKELETAL:  No edema; No deformity  SKIN: Warm and dry NEUROLOGIC:  Alert and oriented x 3 PSYCHIATRIC:  Normal affect   ASSESSMENT:    No diagnosis found. PLAN:    In order of problems listed above:  ***      {Are you ordering a CV Procedure (e.g. stress test, cath, DCCV, TEE, etc)?   Press F2        :[id number]}    Medication Adjustments/Labs and Tests Ordered: Current medicines are reviewed at length with the patient today.  Concerns regarding medicines are outlined above.  No orders of the defined types were placed in this encounter.  No orders of the defined types were placed in this encounter.   There are no Patient Instructions on file for this visit.   Signed, Finis Bud, NP  05/28/2022 8:26 AM    [place]

## 2022-05-29 DIAGNOSIS — K559 Vascular disorder of intestine, unspecified: Secondary | ICD-10-CM | POA: Diagnosis not present

## 2022-05-29 DIAGNOSIS — R55 Syncope and collapse: Secondary | ICD-10-CM | POA: Diagnosis not present

## 2022-05-29 DIAGNOSIS — I25812 Atherosclerosis of bypass graft of coronary artery of transplanted heart without angina pectoris: Secondary | ICD-10-CM | POA: Diagnosis not present

## 2022-05-29 DIAGNOSIS — M7022 Olecranon bursitis, left elbow: Secondary | ICD-10-CM | POA: Diagnosis not present

## 2022-05-29 DIAGNOSIS — I255 Ischemic cardiomyopathy: Secondary | ICD-10-CM | POA: Diagnosis not present

## 2022-05-29 DIAGNOSIS — D62 Acute posthemorrhagic anemia: Secondary | ICD-10-CM | POA: Diagnosis not present

## 2022-05-29 DIAGNOSIS — M25432 Effusion, left wrist: Secondary | ICD-10-CM | POA: Diagnosis not present

## 2022-05-29 DIAGNOSIS — M19032 Primary osteoarthritis, left wrist: Secondary | ICD-10-CM | POA: Diagnosis not present

## 2022-06-11 ENCOUNTER — Ambulatory Visit (INDEPENDENT_AMBULATORY_CARE_PROVIDER_SITE_OTHER): Payer: Medicare Other

## 2022-06-11 ENCOUNTER — Ambulatory Visit: Payer: Medicare Other | Attending: Nurse Practitioner

## 2022-06-11 DIAGNOSIS — R55 Syncope and collapse: Secondary | ICD-10-CM | POA: Diagnosis not present

## 2022-06-11 LAB — ECHOCARDIOGRAM COMPLETE
AR max vel: 2.27 cm2
AV Peak grad: 4.4 mmHg
AV Vena cont: 0.3 cm
Ao pk vel: 1.05 m/s
Area-P 1/2: 2.1 cm2
Calc EF: 38.5 %
MV M vel: 5.11 m/s
MV Peak grad: 104.3 mmHg
P 1/2 time: 957 msec
Radius: 0.27 cm
S' Lateral: 5.3 cm
Single Plane A2C EF: 46.1 %
Single Plane A4C EF: 32.4 %

## 2022-06-28 ENCOUNTER — Encounter: Payer: Self-pay | Admitting: Nurse Practitioner

## 2022-06-28 ENCOUNTER — Ambulatory Visit: Payer: 59 | Attending: Nurse Practitioner | Admitting: Nurse Practitioner

## 2022-06-28 VITALS — BP 130/82 | HR 83 | Ht 67.5 in | Wt 138.6 lb

## 2022-06-28 DIAGNOSIS — I779 Disorder of arteries and arterioles, unspecified: Secondary | ICD-10-CM

## 2022-06-28 DIAGNOSIS — I38 Endocarditis, valve unspecified: Secondary | ICD-10-CM

## 2022-06-28 DIAGNOSIS — R42 Dizziness and giddiness: Secondary | ICD-10-CM

## 2022-06-28 DIAGNOSIS — I2510 Atherosclerotic heart disease of native coronary artery without angina pectoris: Secondary | ICD-10-CM

## 2022-06-28 DIAGNOSIS — I255 Ischemic cardiomyopathy: Secondary | ICD-10-CM

## 2022-06-28 DIAGNOSIS — R55 Syncope and collapse: Secondary | ICD-10-CM | POA: Diagnosis not present

## 2022-06-28 DIAGNOSIS — E785 Hyperlipidemia, unspecified: Secondary | ICD-10-CM

## 2022-06-28 DIAGNOSIS — R011 Cardiac murmur, unspecified: Secondary | ICD-10-CM

## 2022-06-28 DIAGNOSIS — R002 Palpitations: Secondary | ICD-10-CM

## 2022-06-28 DIAGNOSIS — I5020 Unspecified systolic (congestive) heart failure: Secondary | ICD-10-CM | POA: Diagnosis not present

## 2022-06-28 DIAGNOSIS — I10 Essential (primary) hypertension: Secondary | ICD-10-CM

## 2022-06-28 DIAGNOSIS — Z79899 Other long term (current) drug therapy: Secondary | ICD-10-CM

## 2022-06-28 NOTE — Progress Notes (Signed)
Cardiology Office Note:    Date:  05/28/2022  ID:  Billy [name], DOB August 26, 1934, MRN [id number]  PCP:  Billy Labrum, MD   [place] Providers Cardiologist:  Billy Lesches, MD     Referring MD: Billy Labrum, MD   CC: Follow-up for near syncopal episode  History of Present Illness:    Billy [name] is a 88 y.o. male with a hx of the following:  Carotid artery disease Coronary artery disease, status post NSTEMI HFmrEF Hypertension History of melena CKD stage III Hyperlipidemia Bradycardia  Prediabetes History of syncope Hx of symptomatic anemia, blood loss anemia, history of iron deficiency anemia History of chest pain  Patient is a delightful 88 year old male with past medical history as mentioned above.  Previous cardiovascular history includes admission to [facility] in 2019 for cardiac catheterization in setting of unstable angina.  Heart catheterization revealed severe triple-vessel CAD.  Vein graft to RCA had moderately severe stenosis and mid body of graft, distal stented segment of graft had 99% stenosis, treated with PTCA/DES x 2 to mid and distal body of SVG-RCA.  EF minimally reduced to 40%.  Started on DAPT with aspirin and Plavix.  Was started on losartan due to his history of cardiomyopathy.  Repeat echocardiogram in March 2019 revealed mildly reduced EF at 40 to 45% with akinesis of inferolateral and inferior myocardium, normal diastolic function.  Echo in 2020 continue to show stable EF around 45% with inferior and inferolateral wall akinesis.  Last seen by Billy [name] on Oct 08, 2021.  EKG in office that day revealed sinus rhythm, RBBB, LAFB.  Was following Billy [name].  Very active with doing housework and gardening.  Denied any chest pain.  Was overall doing very well.  Was told to follow-up in 6 months.  He contacted our office reporting a syncopal episode.  Wife who spoke with our nurse stated that he almost passed out twice  after straining while having a bowel movement in November 2023.  EMS evaluated him, all his vital signs were good, EKG was okay except showing a skipped beat.  Wife also reported that morning that he had a lot of blood in the toilet while having a bowel movement.  Denied chest pain, dizziness, shortness of breath, fatigue, blurry vision, or lightheadedness.  He was advised to contact his PCP for evaluation, however wife stated she did contact PCP who directed her to contact cardiology.  She was advised to contact PCP again or go visit the ED.  Given his GI bleed and ischemic colitis that was found after telephone call, there was a question regarding continuing Plavix.  Billy [name] was consulted.  It was advised that patient could stop Plavix and continue aspirin on May 11, 2022.  Last seen by me for evaluation was 05/28/2022. He had a near syncopal episodes, was fixing a screwdriver when his neck was hurting, felt lightheaded, and had to sit down, vision was blurry and became short of breath, episode was relieved within 10 minutes. Denied any chest pain surrounding the episode. Denied any recurrence since. Carotid dopplers revealed 1-39% stenosis along bilateral ICA's, ECA's bilaterally appeared > 50% stenosed. Echo revealed reduced EF of 30-35% from 45%, RV enlargement with mid to moderately reduced RVSF.   Today he presents for follow-up. Says he is doing good. Just dizzy when getting up in the mornings. Denies any chest pain, shortness of breath, palpitations, syncope, presyncope, orthopnea, PND, swelling or significant weight changes, acute  bleeding, or claudication. Denies any recurrent near syncopal or syncopal episodes. Said he had what he believes was some recent gout. Denies any other questions or concerns.   Past Medical History:  Diagnosis Date   Arthritis    Carotid artery disease ([place])    08-14% LICA and 4-81% RICA - December 2013   Coronary atherosclerosis of native coronary artery     a. /p CABG in 2001 b. DESx2 to SVG-OM1 and staged DES to SVG-RCA in 04/2014 c. DES to SVG-PDA in 05/2015 d. DESx2 to SVG-OM1-OM2 in 04/2016 e. 06/2017: DESx2 to mid and distal body of SVG-RCA   Essential hypertension    GERD (gastroesophageal reflux disease)    Gout    History of blood transfusion 2017   HOH (hard of hearing)    Mixed hyperlipidemia    Myocardial infarction [facility]) 2017   Sinus bradycardia    Asymptomatic   Skin cancer     Past Surgical History:  Procedure Laterality Date   AMPUTATION Right 07/14/2021   Procedure: REVISION AMPUTATION RIGHT RING FINGER NAILBED REPAIR RIGHT LITTLE FINGER;  Surgeon: Billy Jakob, MD;  Location: [place];  Service: Orthopedics;  Laterality: Right;   APPENDECTOMY     Ruptured   CARDIAC CATHETERIZATION N/A 05/19/2015   Procedure: Left Heart Cath and Cors/Grafts Angiography;  Surgeon: Lorretta Harp, MD;  Location: [place] [facility];  Service: Cardiovascular;  Laterality: N/A;   CARDIAC CATHETERIZATION N/A 05/19/2015   Procedure: Coronary Stent Intervention;  Surgeon: Lorretta Harp, MD;  Location: [place] [facility];  Service: Cardiovascular;  Laterality: N/A;   CARDIAC CATHETERIZATION N/A 04/23/2016   Procedure: LEFT HEART CATH AND CORS/GRAFTS ANGIOGRAPHY;  Surgeon: Leonie Man, MD;  Location: [place] [facility];  Service: Cardiovascular;  Laterality: N/A;   CARDIAC CATHETERIZATION N/A 04/23/2016   Procedure: Coronary Stent Intervention;  Surgeon: Leonie Man, MD;  Location: [place] [facility];  Service: Cardiovascular;  Laterality: N/A;   CATARACT EXTRACTION W/PHACO Right 11/13/2012   Procedure: CATARACT EXTRACTION PHACO AND INTRAOCULAR LENS PLACEMENT (IOC);  Surgeon: Tonny Branch, MD;  Location: [facility];  Service: Ophthalmology;  Laterality: Right;  CDE:12.75   CATARACT EXTRACTION W/PHACO Left 01/11/2013   Procedure: CATARACT EXTRACTION PHACO AND INTRAOCULAR LENS PLACEMENT (IOC);  Surgeon: Tonny Branch, MD;  Location: [facility];  Service:  Ophthalmology;  Laterality: Left;  CDE: 21.13   COLONOSCOPY  09/2013   Dr. Burke Keels: colitis descending colon and sigmoid colon.Bx ?Cdiff vs ischemic.   CORONARY ANGIOPLASTY WITH STENT PLACEMENT  07/01/2017   CORONARY ARTERY BYPASS GRAFT  11/09/1999   LIMA to LAD, SVG to diagonal, SVG to OM1 and OM2, SVG to PDA   CORONARY STENT INTERVENTION N/A 07/01/2017   Procedure: CORONARY STENT INTERVENTION;  Surgeon: Burnell Blanks, MD;  Location: [place] [facility];  Service: Cardiovascular;  Laterality: N/A;   ENTEROSCOPY N/A 12/26/2015   Procedure: ENTEROSCOPY;  Surgeon: Daneil Dolin, MD;  [facility];  Service: Endoscopy;  Laterality: N/A;   ESOPHAGOGASTRODUODENOSCOPY N/A 05/20/2015   Procedure: ESOPHAGOGASTRODUODENOSCOPY (EGD);  Surgeon: Wilford Corner, MD;  Location: [facility] ENDOSCOPY;  Service: Endoscopy;  Laterality: N/A;   ESOPHAGOGASTRODUODENOSCOPY N/A 12/26/2015   Procedure: ESOPHAGOGASTRODUODENOSCOPY (EGD);  Surgeon: Daneil Dolin, MD;  [facility];  Service: Endoscopy;  Laterality: N/A;   GIVENS CAPSULE STUDY N/A 12/24/2015   Procedure: GIVENS CAPSULE STUDY;  Surgeon: Rogene Houston, MD;  [facility];  Service: Endoscopy;  Laterality: N/A;  LEFT HEART CATH AND CORS/GRAFTS ANGIOGRAPHY N/A 07/01/2017   Procedure: LEFT HEART CATH AND CORS/GRAFTS ANGIOGRAPHY;  Surgeon: Burnell Blanks, MD;  Location: [place] [facility];  Service: Cardiovascular;  Laterality: N/A;   LEFT HEART CATHETERIZATION WITH CORONARY ANGIOGRAM N/A 04/22/2014   Procedure: LEFT HEART CATHETERIZATION WITH CORONARY ANGIOGRAM;  Surgeon: Burnell Blanks, MD;  Location: [facility] CATH LAB;  Service: Cardiovascular;  Laterality: N/A;   PERCUTANEOUS CORONARY STENT INTERVENTION (PCI-S) N/A 04/25/2014   Procedure: PERCUTANEOUS CORONARY STENT INTERVENTION (PCI-S);  Surgeon: Peter M Martinique, MD;  Location: [facility] CATH LAB;  Service: Cardiovascular;  Laterality: N/A;   PERIPHERAL VASCULAR  CATHETERIZATION  04/23/2016   Procedure: Thrombectomy;  Surgeon: Leonie Man, MD;  Location: [place] [facility];  Service: Cardiovascular;;    Current Medications: Current Meds  Medication Sig   acetaminophen (TYLENOL) 325 MG tablet Take 2 tablets (650 mg total) by mouth every 6 (six) hours.   aspirin 81 MG chewable tablet Chew 81 mg by mouth daily.   atorvastatin (LIPITOR) 40 MG tablet Take 1 tablet (40 mg total) by mouth daily.   COLCRYS 0.6 MG tablet Take 1 tablet by mouth Once daily as needed (for gout).    cyclobenzaprine (FLEXERIL) 10 MG tablet Take 1 tablet (10 mg total) by mouth 3 times/day as needed-between meals & bedtime for muscle spasms. (Patient taking differently: Take 10 mg by mouth every other day.)   esomeprazole (NEXIUM) 20 MG capsule TAKE 1 CAPSULE BY MOUTH DAILY AT NOON   finasteride (PROSCAR) 5 MG tablet Take 5 mg by mouth daily.   fluticasone (FLONASE) 50 MCG/ACT nasal spray Place 1 spray into both nostrils as needed for allergies or rhinitis.   isosorbide mononitrate (IMDUR) 60 MG 24 hr tablet TAKE ONE-HALF TABLET BY MOUTH  DAILY   Magnesium 250 MG TABS Take 0.5 tablets by mouth daily.    metoprolol succinate (TOPROL-XL) 25 MG 24 hr tablet TAKE 1 TABLET BY MOUTH ONCE  DAILY   Multiple Vitamins-Minerals (PRESERVISION AREDS 2 PO) Take 1 tablet by mouth daily.   nitroGLYCERIN (NITROSTAT) 0.4 MG SL tablet DISSOLVE 1 TABLET UNDER THE TONGUE EVERY 5 MINUTES AS  NEEDED FOR CHEST PAIN. MAX  OF 3 TABLETS IN 15 MINUTES. CALL 911 IF PAIN PERSISTS.   Omega-3 Fatty Acids (FISH OIL) 1000 MG CAPS Take 1 capsule by mouth daily.   sennosides-docusate sodium (SENOKOT-S) 8.6-50 MG tablet Take 1 tablet by mouth daily.   tamsulosin (FLOMAX) 0.4 MG CAPS capsule Take 0.4 mg by mouth at bedtime.     Allergies:   Protonix [pantoprazole sodium]   Social History   Socioeconomic History   Marital status: Married    Spouse name: Not on file   Number of children: Not on file   Years of  education: Not on file   Highest education level: Not on file  Occupational History   Occupation: DISABLED    Comment: [employer]  Tobacco Use   Smoking status: Former    Packs/day: 0.50    Years: 50.00    Total pack years: 25.00    Types: Cigarettes    Start date: 01/08/1947    Quit date: 06/08/1999    Years since quitting: 23.0   Smokeless tobacco: Never  Vaping Use   Vaping Use: Never used  Substance and Sexual Activity   Alcohol use: No    Alcohol/week: 0.0 standard drinks of alcohol    Comment: 07/01/2017 "used to drink beer; quit in 2001"  Drug use: No   Sexual activity: Not on file  Other Topics Concern   Not on file  Social History Narrative   He lives in [place] with his wife.   Social Determinants of Health   Financial Resource Strain: Not on file  Food Insecurity: No Food Insecurity (02/01/2022)   Hunger Vital Sign    Worried About Running Out of Food in the Last Year: Never true    Ran Out of Food in the Last Year: Never true  Transportation Needs: No Transportation Needs (02/01/2022)   PRAPARE - Hydrologist (Medical): No    Lack of Transportation (Non-Medical): No  Physical Activity: Not on file  Stress: Not on file  Social Connections: Not on file     Family History: The patient's family history includes Cancer in his father.  ROS:   Review of Systems  Constitutional: Negative.   HENT: Negative.    Eyes: Negative.   Respiratory: Negative.    Cardiovascular: Negative.   Gastrointestinal: Negative.   Genitourinary: Negative.   Musculoskeletal: Negative.   Skin: Negative.   Neurological:  Positive for dizziness. Negative for tingling, tremors, sensory change, speech change, focal weakness, seizures, loss of consciousness, weakness and headaches.       See HPI.   Endo/Heme/Allergies: Negative.   Psychiatric/Behavioral: Negative.       Please see the history of present illness.    All other systems  reviewed and are negative.  EKGs/Labs/Other Studies Reviewed:    The following studies were reviewed today:   EKG:  EKG is ordered today.  The ekg ordered today demonstrates NSR, with PVC's, RBBB, LVH with repolarization abnormality, with nonspecific ST/T segment changes.   Echocardiogram on June 11, 2022:  1. Left ventricular ejection fraction, by estimation, is 30 to 35%. The  left ventricle has moderately decreased function. The left ventricle  demonstrates regional wall motion abnormalities (see scoring  diagram/findings for description). The left  ventricular internal cavity size was moderately dilated. Left ventricular  diastolic parameters are consistent with Grade I diastolic dysfunction  (impaired relaxation). The average left ventricular global longitudinal  strain is -13.7 %. The global  longitudinal strain is abnormal.   2. Right ventricular systolic function not well visualized but appears to  be mild to moderately reduced. The right ventricular size is mildly  enlarged.   3. Left atrial size was mildly dilated.   4. The mitral valve is grossly normal. Mild mitral valve regurgitation.  No evidence of mitral stenosis.   5. The aortic valve is tricuspid. There is mild calcification of the  aortic valve. Aortic valve regurgitation is mild. No aortic stenosis is  present.   Comparison(s): Prior images reviewed side by side. Changes from prior  study are noted. LVEF worsened from 45% to 30-35% now. Stable mild AI.  Bilateral carotid duplex on June 11, 2022: Summary:  Right Carotid: Velocities in the right ICA are consistent with a 1-39%  stenosis.                The ECA appears >50% stenosed.   Left Carotid: Velocities in the left ICA are consistent with a 1-39%  stenosis.               The ECA appears >50% stenosed.   Vertebrals:  Right vertebral artery demonstrates antegrade flow. Atypical  right              vertebral artery waveform.  Subclavians:  Normal  flow hemodynamics were seen in bilateral subclavian               arteries.   Bilateral carotid duplex on Oct 22, 2019: Summary:  Right Carotid: Velocities in the right ICA are consistent with a 1-39%  stenosis.                The ECA appears >50% stenosed.   Left Carotid: Velocities in the left ICA are consistent with a 1-39%  stenosis.               The ECA appears >50% stenosed.   Vertebrals:  Left vertebral artery demonstrates antegrade flow. Right  vertebral              artery is antegrade with an atypical waveform.  Subclavians: Normal flow hemodynamics were seen in bilateral subclavian               arteries. There is a >20 mm Hg difference in systolic blood               pressure in arms  2D echocardiogram on August 10, 2018: 1. The left ventricle has a visually estimated ejection fraction of of  45%. The inferior and inferolateral walls are akinetic.   2. The mitral valve is myxomatous. Mild thickening of the mitral valve  leaflet. Mild calcification of the mitral valve leaflet. There is mild  mitral annular calcification present. Mitral valve regurgitation is  moderate by color flow Doppler.   3. The aortic valve is tricuspid Mild thickening of the aortic valve Mild  calcification of the aortic valve. Aortic valve regurgitation is mild by  color flow Doppler.   4. The aortic root is normal in size and structure.   5. The right ventricle has normal systolic function. The cavity was  normal. There is no increase in right ventricular wall thickness.   6. The tricuspid valve is normal in structure.   7. Left atrial size was mildly dilated.  Left heart cath and coronary angiography on July 01, 2017:  Ost LM to Mid LM lesion is 50% stenosed. Ost LAD to Prox LAD lesion is 90% stenosed. LIMA graft was visualized by angiography and is normal in caliber. Ost Cx to Prox Cx lesion is 100% stenosed. SVG graft was visualized by angiography and is normal in caliber. Mid RCA  lesion is 100% stenosed. SVG graft was visualized by angiography and is normal in caliber. Dist Graft to Insertion lesion is 99% stenosed. A drug-eluting stent was successfully placed using a STENT SYNERGY DES 3X16. Post intervention, there is a 0% residual stenosis. A drug-eluting stent was successfully placed. Mid Graft lesion is 70% stenosed. Post intervention, there is a 0% residual stenosis. Prox Graft to Dist Graft lesion before 1st Mrg is 30% stenosed. Mid Graft lesion between 1st Mrg and 2nd Mrg is 40% stenosed. Mid Graft lesion is 40% stenosed. Origin lesion is 30% stenosed. There is mild to moderate left ventricular systolic dysfunction. LV end diastolic pressure is normal. The left ventricular ejection fraction is 35-45% by visual estimate.   1. Severe triple vessel CAD s/p 5 V CABG with 5/5 patent bypass grafts 2. The LAD has a high grade proximal stenosis, unchanged. The LIMA graft is patent to the mid LAD. The vein graft is patent to the Diagonal.  3. The Circumflex is occluded proximally. The vein graft to the OM1/OM2 is patent. The stents in this vein graft have mild restenosis.  4.  The RCA is occluded in the mid segment. The vein graft to the RCA has a moderately severe stenosis in the mid body of the graft and the distal stented segment of the graft has 99% stenosis.  5. Moderate LV systolic function, LVEF estimated at 40%.  6. Successful PTCA/DES x 2 mid and distal body of SVG to RCA. No distal protection used as the distal lesion as within the distal stent and there was no good landing zone.    Recommendations: Continue DAPT with ASA and Plavix, continue beta blocker and statin.   Left heart cath coronary angiography on April 23, 2016: Native Vessels: Mid RCA to Dist RCA lesion, 100 %stenosed. Ost Cx to Prox Cx lesion, 95 %stenosed. Prox Cx to Mid Cx lesion, 100 %stenosed. Ost LAD to Prox LAD lesion, 80 %stenosed. The distal vessel fills via the LIMA graft. LV  end diastolic pressure is normal. Graft Angiography: LIMA-LAD is widely patent. SVG-RPDA: Prox Graft to Mid Graft lesion, 45 %stenosed. Mid Graft to Dist Graft stent 0 %stenosed SVG-DIAG: Prox Graft to Mid Graft lesion, 35 %stenosed. CULPRIT LESION: SVG-OM1-OM2: Prox Graft lesion, 90 %stenosed - heavy thrombosis involving the previously placed stent extending all the way to the initial anastomosis. Mid Graft to Dist Graft lesion, 100 %stenosed. A STENT SYNERGY DES 3X28 drug eluting stent was successfully placed - in the more proximal segment extending to and overlaps previously placed stent. A STENT SYNERGY DES 3X38 drug eluting stent was successfully placed just proximal to the initial anastomosis and extends proximally to overlap the previously placed stent. Post intervention, there is a 0% residual stenosis - TIMI 3 flow restored both new stents were widely patent and the previously placed stent was treated with balloon angioplasty with a 3.0 mm noncompliant balloon.   Successful but very complicated PCI restoring TIMI-3 flow in the sequential SVG-OM1-OM 2. There was extensive thrombosis from the proximal graft all the way into both graft vessels.  2 new stents were placed overlapping the prior stent both proximally and distally.   Plan: Transfer to 6 central postprocedure unit for ongoing care. Continue Angiomax until current bag completed. Continue IV nitroglycerin for blood pressure control and taper off overnight. With the extensive amount of stent in 2 grafts now, I would recommend essentially lifelong Plavix and less absolutely has to stop. Could consider stopping aspirin after 3 months. Would anticipate he would be able to discharge tomorrow if stable.   Myoview on April 18, 2014: 1. Abnormal study. Region of inferoseptal and inferolateral scar with moderate peri-infarct ischemia noted, study quality is affected by diaphragmatic attenuation.   2. Normal left ventricular wall  motion.   3. Left ventricular ejection fraction 60%   4. Intermediate-risk stress test findings*.  Recent Labs: 07/14/2021: BUN 26; Creatinine, Ser 1.53; Hemoglobin 13.5; Platelets 242; Potassium 4.8; Sodium 139  Recent Lipid Panel    Component Value Date/Time   CHOL 121 04/23/2016 0346   TRIG 117 04/23/2016 0346   HDL 22 (L) 04/23/2016 0346   CHOLHDL 5.5 04/23/2016 0346   VLDL 23 04/23/2016 0346   LDLCALC 76 04/23/2016 0346     Physical Exam:    VS:  BP 130/82   Pulse 83   Ht 5' 7.5" (1.715 m)   Wt 138 lb 9.6 oz (62.9 kg)   SpO2 99%   BMI 21.39 kg/m     Wt Readings from Last 3 Encounters:  06/28/22 138 lb 9.6 oz (62.9 kg)  05/28/22 138 lb (62.6 kg)  10/08/21 139 lb 9.6 oz (63.3 kg)    GEN: Well nourished, well developed 88 year old male in no acute distress HEENT: Normal NECK: No JVD; No carotid bruits CARDIAC: S1/S2, RRR, Grade 2/6 murmur with radiation to neck, no rubs or gallops noted; 2+ peripheral pulses  RESPIRATORY:  Clear and diminished to auscultation without rales, wheezing or rhonchi  MUSCULOSKELETAL:  No edema; No deformity  SKIN: Thin skin, warm and dry NEUROLOGIC:  Alert and oriented x 3 PSYCHIATRIC:  Normal affect   ASSESSMENT:    1. HFrEF (heart failure with reduced ejection fraction) ([place])   2. Ischemic cardiomyopathy   3. Medication management   4. Near syncope   5. Carotid artery disease without cerebral infarction ([place])   6. Orthostatic dizziness   7. Palpitations   8. Coronary artery disease involving native heart without angina pectoris, unspecified vessel or lesion type   9. Hyperlipidemia, unspecified hyperlipidemia type   10. Hypertension, unspecified type   11. Murmur   12. Valvular insufficiency    PLAN:    In order of problems listed above:  1.  HFrEF, ischemic cardiomyopathy, medication management Updated echocardiogram revealed EF at 30 to 35%, was previously 45%.  Left ventricle demonstrated regional wall motion  abnormalities, left ventricular internal cavity was mildly dilated, grade 1 DD.  Will obtain BMET and neck if kidney function permits, will initiate low-dose Entresto and recheck BMET in 2 weeks per protocol.  Continue aspirin, atorvastatin, Imdur, Toprol-XL, and nitroglycerin as needed. Low sodium diet, fluid restriction <2L, and daily weights encouraged. Educated to contact our office for weight gain of 2 lbs overnight or 5 lbs in one week. Heart healthy diet and regular cardiovascular exercise encouraged.  At next visit, we will discuss about starting Aldactone.  2. Near syncope, carotid artery disease, orthostatic dizziness Etiology uncertain.  Denies any recurrence.  Previous orthostatics negative .  Recent carotid doppler stable bilateral carotid artery stenosis, 1-39%. Continue aspirin and Lipitor. Heart healthy diet and regular cardiovascular exercise encouraged.  Encouraged regular meals during the day. ED precautions discussed.  Discussed conservative measures regarding orthostatic dizziness.  3. Multivessel CAD  He is status post CABG with subsequently documented graft disease, received drug-eluting stent x 2 to SVG to RCA in 2019.  Stable with no anginal symptoms.  No indication for ischemic evaluation.  Continue current medication regimen. Off Plavix d/t hx of previous GI bleed. Heart healthy diet and regular cardiovascular exercise encouraged.   4. HLD No recent lipid profile on file. Being managed by PCP. Continue Lipitor. Heart healthy diet and regular cardiovascular exercise encouraged.   5. HTN BP today 130/82. Discussed to monitor BP at home at least 2 hours after medications and sitting for 5-10 minutes. Care precautions and ED precautions discussed. Encouraged to stay well hydrated. Continue current medication regimen. Heart healthy diet and regular cardiovascular exercise encouraged.   6. Murmur, valvular insufficiency Grade 2/6 murmur noted on exam with radiation to neck. Last  2D echo in 2020 revealed moderate MR, mild AR, and no other significant valvular abnormalities.  Updated echo revealed mild MR, mild AR.  Will continue to monitor over time. Continue current medication regimen. Heart healthy diet and regular cardiovascular exercise encouraged.   7. Disposition: Follow-up with me in 1 month or sooner if anything changes.   Medication Adjustments/Labs and Tests Ordered: Current medicines are reviewed at length with the patient today.  Concerns regarding medicines are outlined above.  Orders Placed This Encounter  Procedures   Basic  metabolic panel   EKG [id number]   No orders of the defined types were placed in this encounter.   Patient Instructions  Medication Instructions:  Your physician recommends that you continue on your current medications as directed. Please refer to the Current Medication list given to you today.   Labwork: BMET  Testing/Procedures: None  Follow-Up: Follow up with Finis Bud, NP in 1 month.   Any Other Special Instructions Will Be Listed Below (If Applicable).     If you need a refill on your cardiac medications before your next appointment, please call your pharmacy.    Signed, Finis Bud, NP  06/29/2022 3:43 PM    [place]

## 2022-06-28 NOTE — Patient Instructions (Signed)
Medication Instructions:  Your physician recommends that you continue on your current medications as directed. Please refer to the Current Medication list given to you today.   Labwork: BMET  Testing/Procedures: None  Follow-Up: Follow up with Finis Bud, NP in 1 month.   Any Other Special Instructions Will Be Listed Below (If Applicable).     If you need a refill on your cardiac medications before your next appointment, please call your pharmacy.

## 2022-06-30 ENCOUNTER — Telehealth: Payer: Self-pay | Admitting: Cardiology

## 2022-06-30 DIAGNOSIS — I5020 Unspecified systolic (congestive) heart failure: Secondary | ICD-10-CM

## 2022-06-30 DIAGNOSIS — Z79899 Other long term (current) drug therapy: Secondary | ICD-10-CM

## 2022-06-30 NOTE — Telephone Encounter (Signed)
Pt's wife is returning call in regards to results. Requesting return call.

## 2022-07-01 ENCOUNTER — Other Ambulatory Visit: Payer: Self-pay | Admitting: *Deleted

## 2022-07-01 DIAGNOSIS — Z79899 Other long term (current) drug therapy: Secondary | ICD-10-CM

## 2022-07-01 DIAGNOSIS — I5020 Unspecified systolic (congestive) heart failure: Secondary | ICD-10-CM

## 2022-07-01 MED ORDER — ENTRESTO 24-26 MG PO TABS: 1.0000 | ORAL_TABLET | Freq: Two times a day (BID) | ORAL | 6 refills | Status: DC

## 2022-07-01 NOTE — Telephone Encounter (Signed)
Pt is returning call and is requesting return call.

## 2022-07-01 NOTE — Telephone Encounter (Signed)
Pt is returning call. Requesting return call.

## 2022-07-01 NOTE — Telephone Encounter (Signed)
-----  Message from Finis Bud, NP sent at 06/29/2022  4:05 PM EST ----- Kidney function is excellent. Please start Entresto 24/26 mg BID and repeat BMET in 2 weeks for HFrEF.   Thanks!   Finis Bud, AGNP-C

## 2022-07-01 NOTE — Telephone Encounter (Signed)
Patient's wife [name] informed and verbalized understanding of plan. Copy sent to PCP

## 2022-07-03 ENCOUNTER — Other Ambulatory Visit: Payer: Self-pay | Admitting: Cardiology

## 2022-07-14 ENCOUNTER — Ambulatory Visit: Payer: 59 | Attending: Cardiology | Admitting: Cardiology

## 2022-07-14 ENCOUNTER — Encounter: Payer: Self-pay | Admitting: Cardiology

## 2022-07-14 ENCOUNTER — Other Ambulatory Visit: Payer: Self-pay | Admitting: *Deleted

## 2022-07-14 VITALS — BP 126/58 | HR 80 | Ht 67.5 in | Wt 140.0 lb

## 2022-07-14 DIAGNOSIS — I255 Ischemic cardiomyopathy: Secondary | ICD-10-CM

## 2022-07-14 DIAGNOSIS — I5020 Unspecified systolic (congestive) heart failure: Secondary | ICD-10-CM

## 2022-07-14 DIAGNOSIS — Z79899 Other long term (current) drug therapy: Secondary | ICD-10-CM

## 2022-07-14 DIAGNOSIS — I25119 Atherosclerotic heart disease of native coronary artery with unspecified angina pectoris: Secondary | ICD-10-CM

## 2022-07-14 NOTE — Patient Instructions (Addendum)
Medication Instructions:  Your physician recommends that you continue on your current medications as directed. Please refer to the Current Medication list given to you today.  Labwork: BMET today at [facility]  Testing/Procedures: none  Follow-Up: Your physician recommends that you schedule a follow-up appointment in: 3 months  Any Other Special Instructions Will Be Listed Below (If Applicable).  If you need a refill on your cardiac medications before your next appointment, please call your pharmacy.

## 2022-07-14 NOTE — Progress Notes (Signed)
Cardiology Office Note  Date: 07/14/2022   ID: Billy [name], DOB 17-Dec-1934, MRN [id number]  PCP:  Curlene Labrum, MD  Cardiologist:  Rozann Lesches, MD Electrophysiologist:  None   Chief Complaint  Patient presents with   Cardiac follow-up    History of Present Illness: Billy [name] is an 88 y.o. male seen recently in late January by Ms. Arlington Calix NP, I reviewed the note.  He is here with his wife for a follow-up visit.  Describes NYHA class II dyspnea with most activities, no obvious fluid retention.  No progressive angina.  He does mention lower back pain that tends to bother him in the mornings when he gets up.  Still has intermittent sense of orthostatic lightheadedness, but no syncope.  Echocardiogram from early January revealed LVEF 30 to 35% range with wall motion abnormalities consistent with ischemic cardiomyopathy.  Also had reduced RV contraction, mild mitral regurgitation, and mild aortic regurgitation with sclerotic aortic valve.  He has been modified, most recently addition of Entresto.  At this point he is not on a diuretic.  Baseline lab work from January noted below, he is due for follow-up BMET.  Past Medical History:  Diagnosis Date   Arthritis    Carotid artery disease ([place])    54-62% LICA and 7-03% RICA - December 2013   Coronary atherosclerosis of native coronary artery    a. /p CABG in 2001 b. DESx2 to SVG-OM1 and staged DES to SVG-RCA in 04/2014 c. DES to SVG-PDA in 05/2015 d. DESx2 to SVG-OM1-OM2 in 04/2016 e. 06/2017: DESx2 to mid and distal body of SVG-RCA   Essential hypertension    GERD (gastroesophageal reflux disease)    Gout    History of blood transfusion 2017   HOH (hard of hearing)    Mixed hyperlipidemia    Myocardial infarction [facility]) 2017   Sinus bradycardia    Asymptomatic   Skin cancer     Current Outpatient Medications  Medication Sig Dispense Refill   acetaminophen (TYLENOL) 325 MG tablet Take 2 tablets (650 mg total) by mouth  every 6 (six) hours.     aspirin 81 MG chewable tablet Chew 81 mg by mouth daily.     atorvastatin (LIPITOR) 40 MG tablet Take 1 tablet (40 mg total) by mouth daily.     COLCRYS 0.6 MG tablet Take 1 tablet by mouth Once daily as needed (for gout).      cyclobenzaprine (FLEXERIL) 10 MG tablet Take 1 tablet (10 mg total) by mouth 3 times/day as needed-between meals & bedtime for muscle spasms. (Patient taking differently: Take 10 mg by mouth every other day.) 30 tablet 0   esomeprazole (NEXIUM) 20 MG capsule TAKE 1 CAPSULE BY MOUTH DAILY AT NOON 100 capsule 1   finasteride (PROSCAR) 5 MG tablet Take 5 mg by mouth daily.     fluticasone (FLONASE) 50 MCG/ACT nasal spray Place 1 spray into both nostrils as needed for allergies or rhinitis.     isosorbide mononitrate (IMDUR) 60 MG 24 hr tablet TAKE ONE-HALF TABLET BY MOUTH  DAILY 50 tablet 2   Magnesium 250 MG TABS Take 0.5 tablets by mouth daily.      metoprolol succinate (TOPROL-XL) 25 MG 24 hr tablet TAKE 1 TABLET BY MOUTH ONCE  DAILY 100 tablet 2   Multiple Vitamins-Minerals (PRESERVISION AREDS 2 PO) Take 1 tablet by mouth daily.     nitroGLYCERIN (NITROSTAT) 0.4 MG SL tablet DISSOLVE 1 TABLET UNDER THE TONGUE EVERY  5 MINUTES AS  NEEDED FOR CHEST PAIN. MAX  OF 3 TABLETS IN 15 MINUTES. CALL 911 IF PAIN PERSISTS. 100 tablet 3   Omega-3 Fatty Acids (FISH OIL) 1000 MG CAPS Take 1 capsule by mouth daily.     sacubitril-valsartan (ENTRESTO) 24-26 MG Take 1 tablet by mouth 2 (two) times daily. 30 day free info faxed 60 tablet 6   sennosides-docusate sodium (SENOKOT-S) 8.6-50 MG tablet Take 1 tablet by mouth daily.     tamsulosin (FLOMAX) 0.4 MG CAPS capsule Take 0.4 mg by mouth at bedtime.     No current facility-administered medications for this visit.   Allergies:  Protonix [pantoprazole sodium]   ROS: Palpitations or unexplained syncope.  No orthopnea or PND.  Physical Exam: VS:  BP (!) 126/58   Pulse 80   Ht 5' 7.5" (1.715 m)   Wt 140 lb (63.5  kg)   SpO2 96%   BMI 21.60 kg/m , BMI Body mass index is 21.6 kg/m.  Wt Readings from Last 3 Encounters:  07/14/22 140 lb (63.5 kg)  06/28/22 138 lb 9.6 oz (62.9 kg)  05/28/22 138 lb (62.6 kg)    General: Patient appears comfortable at rest. HEENT: Conjunctiva and lids normal. Neck: Supple, no elevated JVP or carotid bruits. Lungs: Clear to auscultation, nonlabored breathing at rest. Cardiac: Regular rate and rhythm, no S3, 1/6 systolic murmur. Extremities: No pitting edema.  ECG:  An ECG dated 06/28/2022 was personally reviewed today and demonstrated:  With right bundle branch block and left anterior fascicular block, PVC.  Recent Labwork: 07/14/2021: BUN 26; Creatinine, Ser 1.53; Hemoglobin 13.5; Platelets 242; Potassium 4.8; Sodium 139  June 2023: Cholesterol 132, triglycerides 114, HDL 32, LDL 79 November 2023: Hemoglobin 12.7, platelets 247 January 2024: Potassium 4.4, BUN 17, creatinine 1.14  Other Studies Reviewed Today:  Carotid Dopplers 06/11/2022: Summary:  Right Carotid: Velocities in the right ICA are consistent with a 1-39%  stenosis.                The ECA appears >50% stenosed.   Left Carotid: Velocities in the left ICA are consistent with a 1-39%  stenosis.               The ECA appears >50% stenosed.   Vertebrals:  Right vertebral artery demonstrates antegrade flow. Atypical  right              vertebral artery waveform.  Subclavians: Normal flow hemodynamics were seen in bilateral subclavian               arteries.   Echocardiogram 06/11/2022:  1. Left ventricular ejection fraction, by estimation, is 30 to 35%. The  left ventricle has moderately decreased function. The left ventricle  demonstrates regional wall motion abnormalities (see scoring  diagram/findings for description). The left  ventricular internal cavity size was moderately dilated. Left ventricular  diastolic parameters are consistent with Grade I diastolic dysfunction  (impaired  relaxation). The average left ventricular global longitudinal  strain is -13.7 %. The global  longitudinal strain is abnormal.   2. Right ventricular systolic function not well visualized but appears to  be mild to moderately reduced. The right ventricular size is mildly  enlarged.   3. Left atrial size was mildly dilated.   4. The mitral valve is grossly normal. Mild mitral valve regurgitation.  No evidence of mitral stenosis.   5. The aortic valve is tricuspid. There is mild calcification of the  aortic valve. Aortic valve regurgitation  is mild. No aortic stenosis is  present.   Assessment and Plan:  1.  HFrEF with ischemic cardiomyopathy, LVEF most recently 30 to 35% range.  Currently reporting NYHA class II dyspnea with stable volume status.  Continue Toprol-XL and Entresto, check BMET.  Might be able to add Aldactone 12.5 mg daily, but his relatively low blood pressure at baseline will limit GDMT overall.  Alternatively SGLT2 inhibitor instead of MRA would be an option.  Follow-up on lab work first.  Plan to optimize medical therapy as best we can, would not pursue defibrillator at this point however.  2.  Multivessel CAD status post CABG with subsequently documented graft disease and PCI including DES x 2 to the SVG to RCA in 2019.  No obvious angina at this time.  Continue aspirin, Lipitor, Imdur, Toprol-XL, and as needed nitroglycerin.  3.  Mixed hyperlipidemia, LDL 79 in June 2023.  Continue Lipitor for now.  Medication Adjustments/Labs and Tests Ordered: Current medicines are reviewed at length with the patient today.  Concerns regarding medicines are outlined above.   Tests Ordered: Orders Placed This Encounter  Procedures   Basic metabolic panel    Medication Changes: No orders of the defined types were placed in this encounter.   Disposition:  Follow up  3 months.  Signed, Satira Sark, MD, [facility] 07/14/2022 12:18 PM    [facility] at  [address], [place], [place] [address] Phone: [phone]; Fax: [phone]

## 2022-07-15 ENCOUNTER — Telehealth: Payer: Self-pay | Admitting: *Deleted

## 2022-07-15 MED ORDER — DAPAGLIFLOZIN PROPANEDIOL 10 MG PO TABS: 10.0000 mg | ORAL_TABLET | Freq: Every day | ORAL | 6 refills | Status: DC

## 2022-07-15 NOTE — Telephone Encounter (Signed)
Patient's wife [name] informed and verbalized understanding of plan. [place]ade free 30 day voucher faxed to [place] sent to PCP

## 2022-07-15 NOTE — Telephone Encounter (Signed)
-----   Message from Satira Sark, MD sent at 07/15/2022  8:11 AM EST ----- Results reviewed.  Please let him know that renal function and potassium remain normal range after recent medication adjustments.  In terms of adding further GDMT to optimize treatment for his cardiomyopathy, I think our next step might be the addition of SGLT2 inhibitor such as Jardiance 10 mg daily or Farxiga 10 mg daily depending on his insurance coverage.  This would be less associated with hypotension in his case.

## 2022-07-16 DIAGNOSIS — H353112 Nonexudative age-related macular degeneration, right eye, intermediate dry stage: Secondary | ICD-10-CM | POA: Diagnosis not present

## 2022-07-16 DIAGNOSIS — H524 Presbyopia: Secondary | ICD-10-CM | POA: Diagnosis not present

## 2022-08-03 ENCOUNTER — Telehealth: Payer: Self-pay | Admitting: Cardiology

## 2022-08-03 MED ORDER — DAPAGLIFLOZIN PROPANEDIOL 10 MG PO TABS: 10.0000 mg | ORAL_TABLET | Freq: Every day | ORAL | 3 refills | Status: DC

## 2022-08-03 MED ORDER — ENTRESTO 24-26 MG PO TABS: 1.0000 | ORAL_TABLET | Freq: Two times a day (BID) | ORAL | 3 refills | Status: DC

## 2022-08-03 NOTE — Telephone Encounter (Signed)
*  STAT* If patient is at the pharmacy, call can be transferred to refill team.   1. Which medications need to be refilled? (please list name of each medication and dose if known) dapagliflozin propanediol (FARXIGA) 10 MG TABS tablet   sacubitril-valsartan (ENTRESTO) 24-26 MG   2. Which pharmacy/location (including street and city if local pharmacy) is medication to be sent to?  [facility] ([place], [place]    3. Do they need a 30 day or 90 day supply? [place]

## 2022-08-10 DIAGNOSIS — Z23 Encounter for immunization: Secondary | ICD-10-CM | POA: Diagnosis not present

## 2022-08-10 DIAGNOSIS — M19032 Primary osteoarthritis, left wrist: Secondary | ICD-10-CM | POA: Diagnosis not present

## 2022-08-10 DIAGNOSIS — K559 Vascular disorder of intestine, unspecified: Secondary | ICD-10-CM | POA: Diagnosis not present

## 2022-08-10 DIAGNOSIS — R55 Syncope and collapse: Secondary | ICD-10-CM | POA: Diagnosis not present

## 2022-08-10 DIAGNOSIS — M25432 Effusion, left wrist: Secondary | ICD-10-CM | POA: Diagnosis not present

## 2022-08-10 DIAGNOSIS — I255 Ischemic cardiomyopathy: Secondary | ICD-10-CM | POA: Diagnosis not present

## 2022-08-10 DIAGNOSIS — M7022 Olecranon bursitis, left elbow: Secondary | ICD-10-CM | POA: Diagnosis not present

## 2022-08-10 DIAGNOSIS — Z6821 Body mass index (BMI) 21.0-21.9, adult: Secondary | ICD-10-CM | POA: Diagnosis not present

## 2022-08-10 DIAGNOSIS — D62 Acute posthemorrhagic anemia: Secondary | ICD-10-CM | POA: Diagnosis not present

## 2022-08-10 DIAGNOSIS — I25812 Atherosclerosis of bypass graft of coronary artery of transplanted heart without angina pectoris: Secondary | ICD-10-CM | POA: Diagnosis not present

## 2022-08-10 DIAGNOSIS — R030 Elevated blood-pressure reading, without diagnosis of hypertension: Secondary | ICD-10-CM | POA: Diagnosis not present

## 2022-08-11 IMAGING — CR DG HAND COMPLETE 3+V*R*
3 series · 3 of 3 positions shown · non-contrast
Comparison: 10/19/2020

CLINICAL DATA: Right hand laceration from table saw

EXAM:
RIGHT HAND - COMPLETE 3+ VIEW

[hand pa]
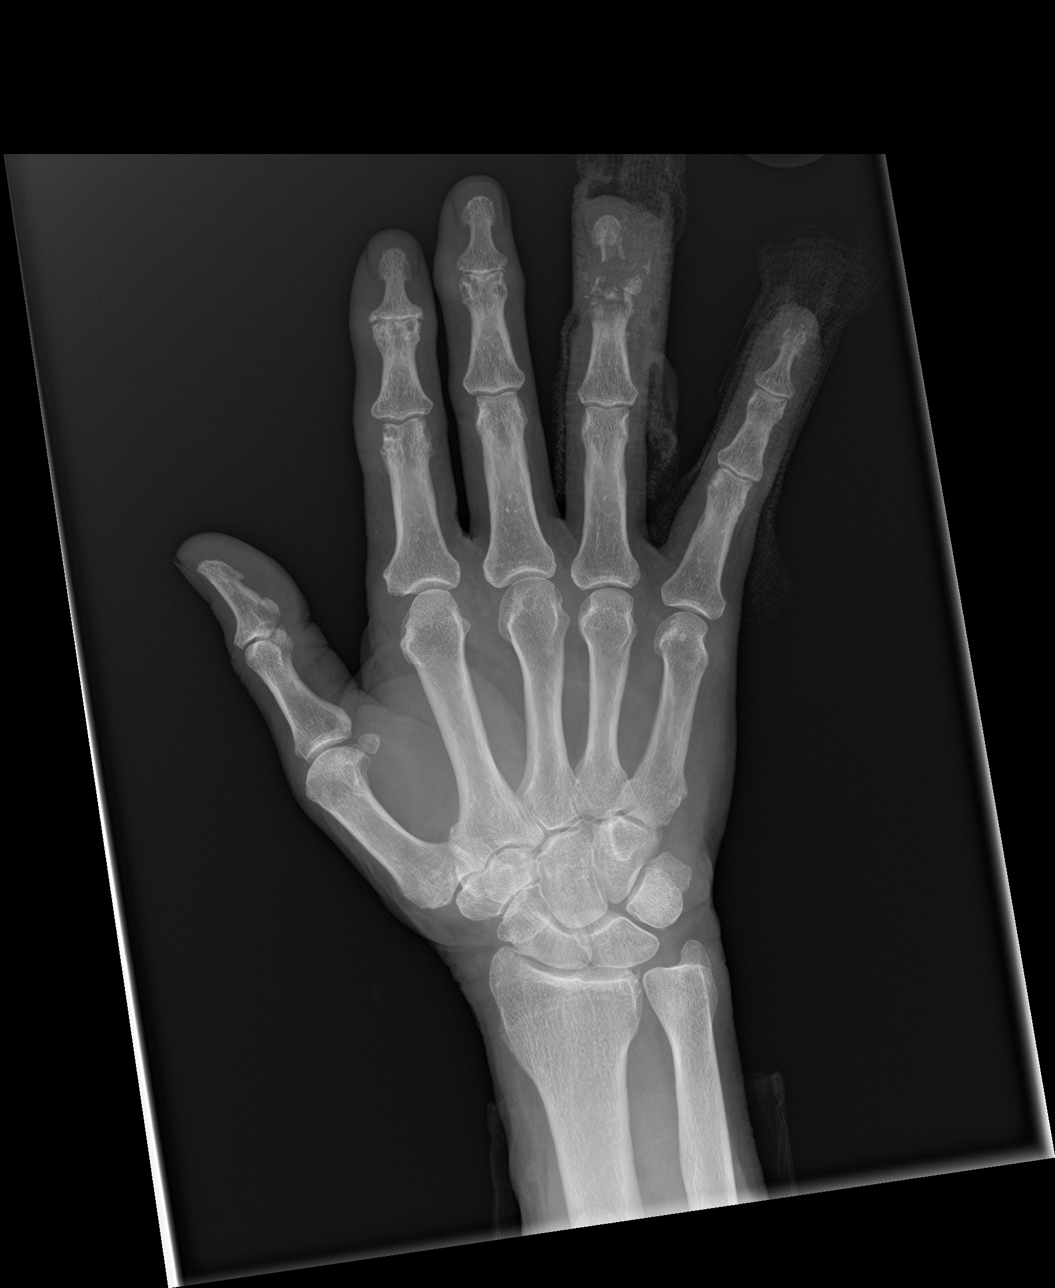

[hand obl]
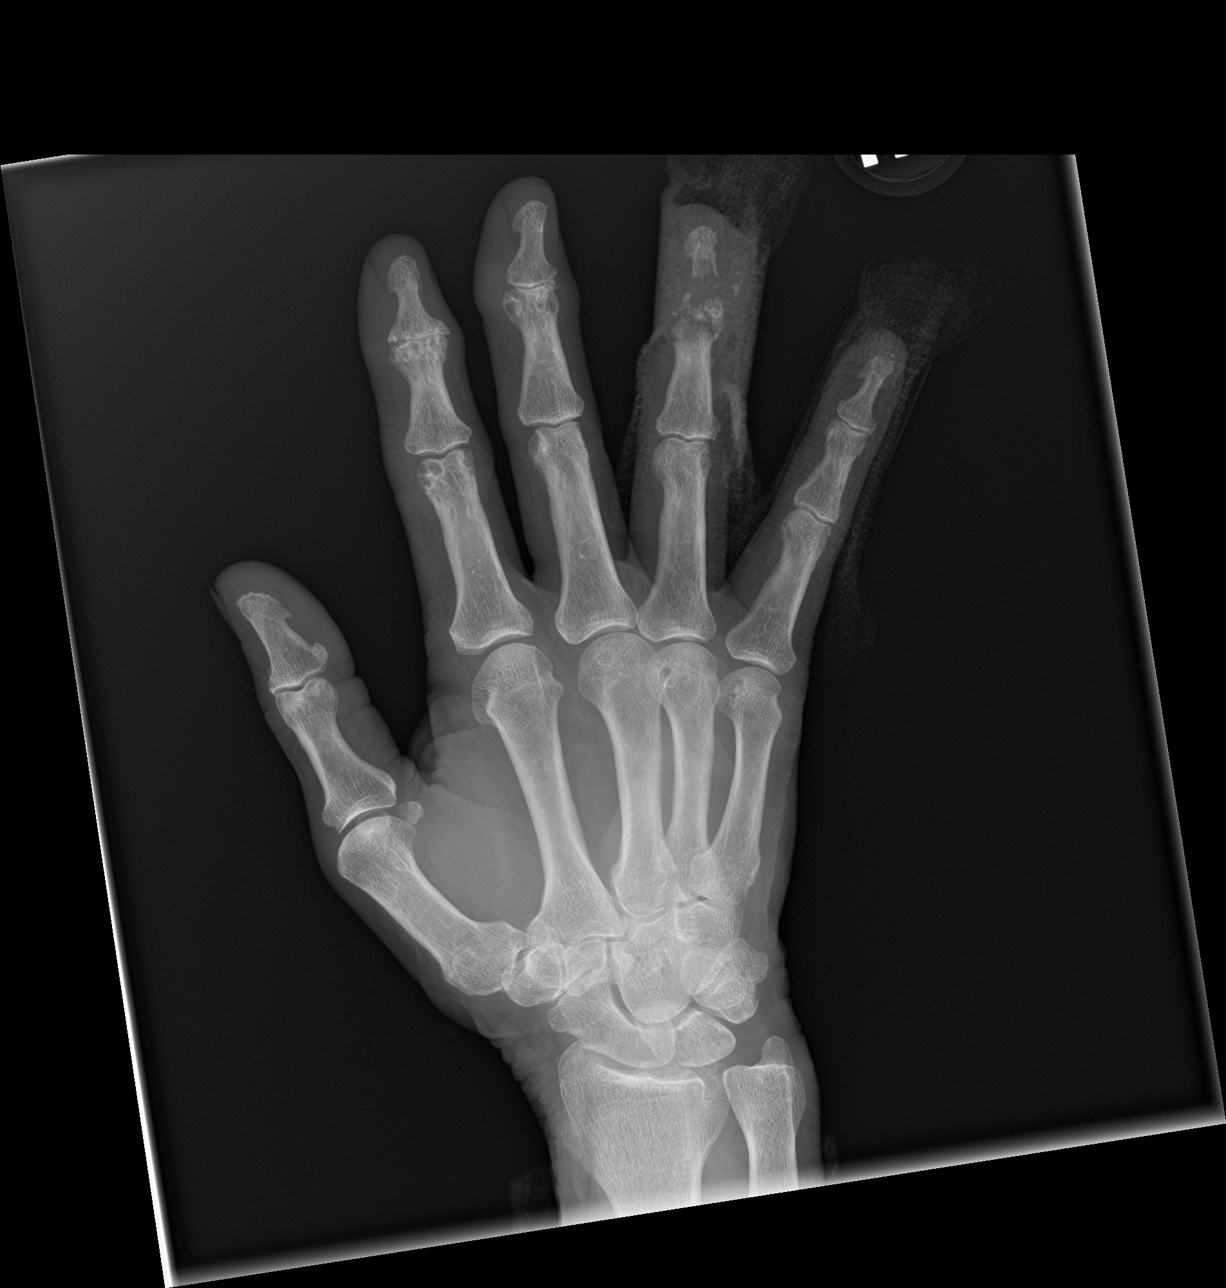

[hand lat]
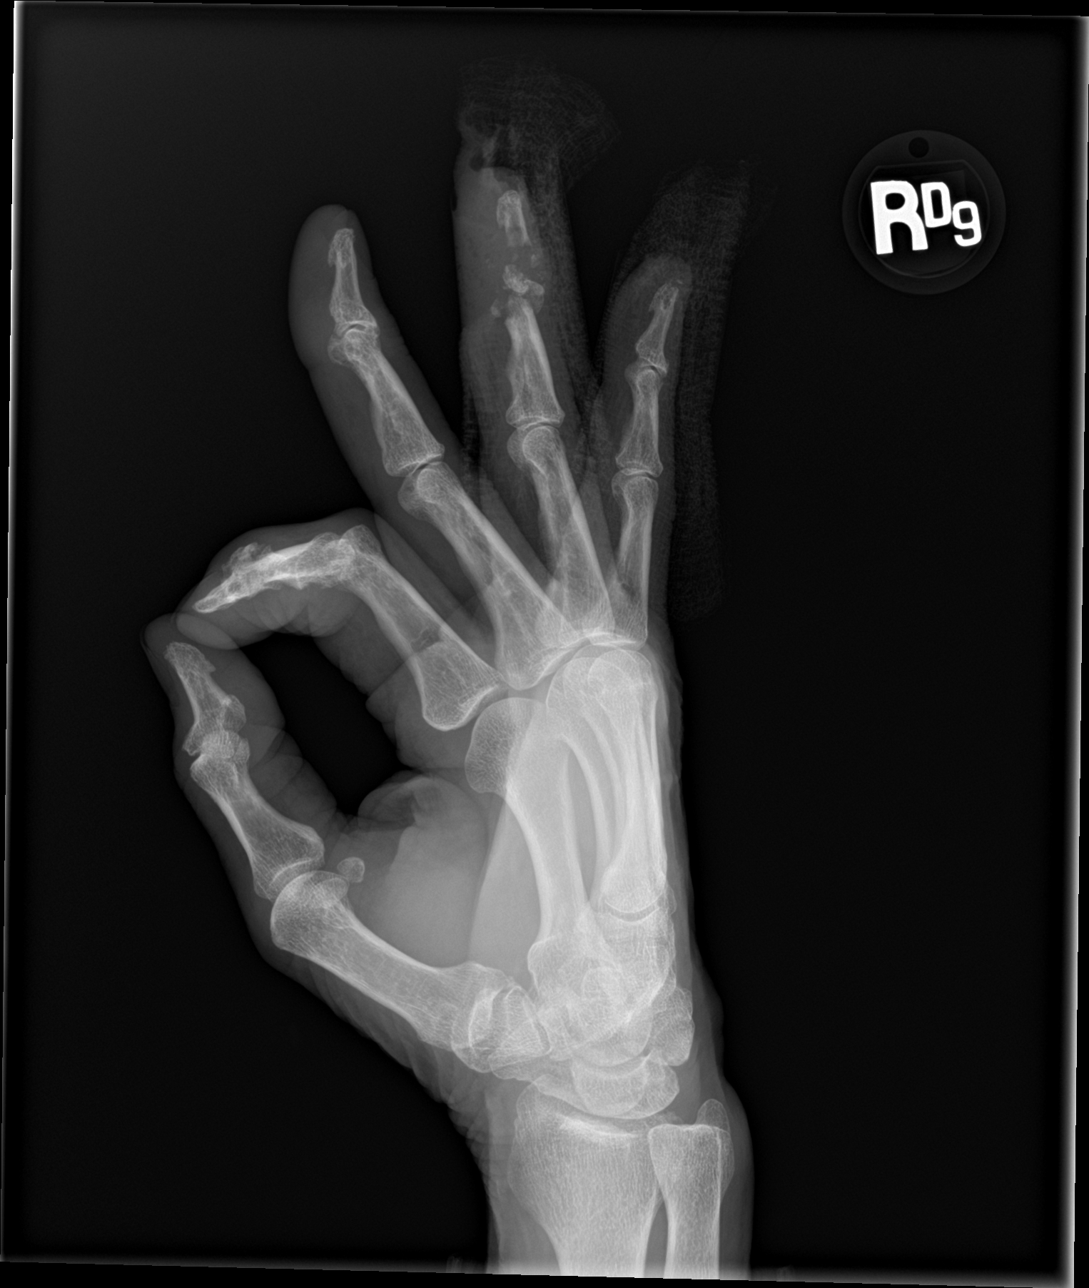

[3 of 3 positions shown; findings below may reference images not displayed]

FINDINGS: Acute comminuted and displaced fracture through the distal phalanx
of the right ring finger. Fracture extends intra-articularly to the
distal interphalangeal joint. The head of the ring finger middle
phalanx is also fractured. Overlying soft tissue swelling and
irregularity compatible with open fracture.

Minimally comminuted and displaced fracture involving the distal
tuft of the small finger distal phalanx with overlying soft tissue
irregularity.

Similar changes of advanced distal-predominant arthropathy of the
right hand. No retained radiopaque foreign bodies are seen
IMPRESSION: 1. Acute comminuted and displaced open fractures involving the
distal phalanx and middle phalanx of the right ring finger with
intra-articular extension to the DIP joint.
2. Acute minimally displaced open fracture of the distal tuft of the
small finger distal phalanx.

## 2022-08-19 ENCOUNTER — Other Ambulatory Visit: Payer: Self-pay | Admitting: *Deleted

## 2022-08-19 ENCOUNTER — Telehealth: Payer: Self-pay | Admitting: Cardiology

## 2022-08-19 NOTE — Telephone Encounter (Signed)
Pt c/o medication issue:  1. Name of Medication:  dapagliflozin propanediol (FARXIGA) 10 MG TABS tablet  2. How are you currently taking this medication (dosage and times per day)? As written  3. Are you having a reaction (difficulty breathing--STAT)? No   4. What is your medication issue? Confusion, muscle pain, dizziness, SOB

## 2022-08-19 NOTE — Telephone Encounter (Signed)
Spoke with wife Billy [name]) - thinks these symptoms are related to the [place].    132/40  64 112/50  67 116/55  63 126/54  65  No readings taken for the month of March yet.   Next visit scheduled for 10/25/2022 with SM.

## 2022-08-20 NOTE — Telephone Encounter (Signed)
Left message to return call 

## 2022-08-20 NOTE — Telephone Encounter (Signed)
Patient notified and verbalized understanding.   She will update [place] in about 7-10 days on how he is doing.

## 2022-08-29 ENCOUNTER — Other Ambulatory Visit: Payer: Self-pay | Admitting: Cardiology

## 2022-08-31 ENCOUNTER — Other Ambulatory Visit: Payer: Self-pay | Admitting: Cardiology

## 2022-09-01 ENCOUNTER — Telehealth: Payer: Self-pay | Admitting: Cardiology

## 2022-09-01 NOTE — Telephone Encounter (Signed)
Patient's wife [name] informed and verbalized understanding of plan.

## 2022-09-01 NOTE — Telephone Encounter (Signed)
Patient's wife called and said that patient is doing better since he was taken off of [place] so no dizzy spells or pain in neck

## 2022-09-03 NOTE — Progress Notes (Signed)
[facility] - Clinic Note  09/13/2022     CHIEF COMPLAINT Patient presents for Retina Evaluation   HISTORY OF PRESENT ILLNESS: Billy [name] is a 88 y.o. male who presents to the clinic today for:   HPI     Retina Evaluation   In both eyes.  This started 1 month ago.  Duration of 1 month.  Associated Symptoms Floaters.  Context:  distance vision, mid-range vision and near vision.  Response to treatment was no improvement.  I, the attending physician,  performed the HPI with the patient and updated documentation appropriately.        Comments   Retina eval per Dr Earlene Plater for AMD pt is reporting double vision when watching TV in the right eye he has been seeing wavy lines in his amsler grid pt sees floaters but denies flashes of light       Last edited by Rennis Chris, MD on 09/13/2022  5:32 PM.    Pt is here on the referral of Dr. Earlene Plater for concern of ARMD, pt states he has been monitoring his vision on an amsler grid and he has a black dot in his right eye vision, but left eye seems okay, pt states he stopped smoking 20 years ago, he is taking AREDS 2  Referring physician: Desiree Lucy, OD [address] [place],  [place] [address]  HISTORICAL INFORMATION:   Selected notes from the MEDICAL RECORD NUMBER Referred by Dr. Earlene Plater retina eval -- ERM and ARMD LEE:  Ocular Hx- PMH-    CURRENT MEDICATIONS: No current outpatient medications on file. (Ophthalmic Drugs)   No current facility-administered medications for this visit. (Ophthalmic Drugs)   Current Outpatient Medications (Other)  Medication Sig   acetaminophen (TYLENOL) 325 MG tablet Take 2 tablets (650 mg total) by mouth every 6 (six) hours.   aspirin 81 MG chewable tablet Chew 81 mg by mouth daily.   atorvastatin (LIPITOR) 40 MG tablet Take 1 tablet (40 mg total) by mouth daily.   COLCRYS 0.6 MG tablet Take 1 tablet by mouth Once daily as needed (for gout).    cyclobenzaprine (FLEXERIL) 10  MG tablet Take 1 tablet (10 mg total) by mouth 3 times/day as needed-between meals & bedtime for muscle spasms. (Patient taking differently: Take 10 mg by mouth every other day.)   esomeprazole (NEXIUM) 20 MG capsule TAKE 1 CAPSULE BY MOUTH DAILY AT NOON   finasteride (PROSCAR) 5 MG tablet Take 5 mg by mouth daily.   fluticasone (FLONASE) 50 MCG/ACT nasal spray Place 1 spray into both nostrils as needed for allergies or rhinitis.   isosorbide mononitrate (IMDUR) 60 MG 24 hr tablet TAKE ONE-HALF TABLET BY MOUTH  DAILY   Magnesium 250 MG TABS Take 0.5 tablets by mouth daily.    metoprolol succinate (TOPROL-XL) 25 MG 24 hr tablet TAKE 1 TABLET BY MOUTH ONCE  DAILY   Multiple Vitamins-Minerals (PRESERVISION AREDS 2 PO) Take 1 tablet by mouth daily.   nitroGLYCERIN (NITROSTAT) 0.4 MG SL tablet DISSOLVE 1 TABLET UNDER THE TONGUE EVERY 5 MINUTES AS  NEEDED FOR CHEST PAIN. MAX  OF 3 TABLETS IN 15 MINUTES. CALL 911 IF PAIN PERSISTS.   Omega-3 Fatty Acids (FISH OIL) 1000 MG CAPS Take 1 capsule by mouth daily.   sacubitril-valsartan (ENTRESTO) 24-26 MG Take 1 tablet by mouth 2 (two) times daily. 30 day free info faxed   sennosides-docusate sodium (SENOKOT-S) 8.6-50 MG tablet Take 1 tablet by  mouth daily.   tamsulosin (FLOMAX) 0.4 MG CAPS capsule Take 0.4 mg by mouth at bedtime.   No current facility-administered medications for this visit. (Other)   REVIEW OF SYSTEMS: ROS   Positive for: Cardiovascular, Eyes Last edited by Etheleen Mayhew, COT on 09/13/2022  1:03 PM.     ALLERGIES Allergies  Allergen Reactions   Protonix [Pantoprazole Sodium] Itching and Rash   PAST MEDICAL HISTORY Past Medical History:  Diagnosis Date   Arthritis    Carotid artery disease    40-59% LICA and 0-39% RICA - December 2013   Coronary atherosclerosis of native coronary artery    a. /p CABG in 2001 b. DESx2 to SVG-OM1 and staged DES to SVG-RCA in 04/2014 c. DES to SVG-PDA in 05/2015 d. DESx2 to SVG-OM1-OM2 in  04/2016 e. 06/2017: DESx2 to mid and distal body of SVG-RCA   Essential hypertension    GERD (gastroesophageal reflux disease)    Gout    History of blood transfusion 2017   HOH (hard of hearing)    Mixed hyperlipidemia    Myocardial infarction 2017   Sinus bradycardia    Asymptomatic   Skin cancer    Past Surgical History:  Procedure Laterality Date   AMPUTATION Right 07/14/2021   Procedure: REVISION AMPUTATION RIGHT RING FINGER NAILBED REPAIR RIGHT LITTLE FINGER;  Surgeon: Mack Hook, MD;  Location: [facility] OR;  Service: Orthopedics;  Laterality: Right;   APPENDECTOMY     Ruptured   CARDIAC CATHETERIZATION N/A 05/19/2015   Procedure: Left Heart Cath and Cors/Grafts Angiography;  Surgeon: Runell Gess, MD;  Location: [facility] INVASIVE CV LAB;  Service: Cardiovascular;  Laterality: N/A;   CARDIAC CATHETERIZATION N/A 05/19/2015   Procedure: Coronary Stent Intervention;  Surgeon: Runell Gess, MD;  Location: [facility] INVASIVE CV LAB;  Service: Cardiovascular;  Laterality: N/A;   CARDIAC CATHETERIZATION N/A 04/23/2016   Procedure: LEFT HEART CATH AND CORS/GRAFTS ANGIOGRAPHY;  Surgeon: Marykay Lex, MD;  Location: [facility] INVASIVE CV LAB;  Service: Cardiovascular;  Laterality: N/A;   CARDIAC CATHETERIZATION N/A 04/23/2016   Procedure: Coronary Stent Intervention;  Surgeon: Marykay Lex, MD;  Location: [facility] INVASIVE CV LAB;  Service: Cardiovascular;  Laterality: N/A;   CATARACT EXTRACTION W/PHACO Right 11/13/2012   Procedure: CATARACT EXTRACTION PHACO AND INTRAOCULAR LENS PLACEMENT (IOC);  Surgeon: Gemma Payor, MD;  Location: [facility] ORS;  Service: Ophthalmology;  Laterality: Right;  CDE:12.75   CATARACT EXTRACTION W/PHACO Left 01/11/2013   Procedure: CATARACT EXTRACTION PHACO AND INTRAOCULAR LENS PLACEMENT (IOC);  Surgeon: Gemma Payor, MD;  Location: [facility] ORS;  Service: Ophthalmology;  Laterality: Left;  CDE: 21.13   COLONOSCOPY  09/2013   Dr. Erskine Speed: colitis descending colon and sigmoid colon.Bx ?Cdiff  vs ischemic.   CORONARY ANGIOPLASTY WITH STENT PLACEMENT  07/01/2017   CORONARY ARTERY BYPASS GRAFT  11/09/1999   LIMA to LAD, SVG to diagonal, SVG to OM1 and OM2, SVG to PDA   CORONARY STENT INTERVENTION N/A 07/01/2017   Procedure: CORONARY STENT INTERVENTION;  Surgeon: Kathleene Hazel, MD;  Location: [facility] INVASIVE CV LAB;  Service: Cardiovascular;  Laterality: N/A;   ENTEROSCOPY N/A 12/26/2015   Procedure: ENTEROSCOPY;  Surgeon: Corbin Ade, MD;  Location: [facility] END[facility] SUITE;  Service: Endoscopy;  Laterality: N/A;   ESOPHAGOGASTRODUODENOSCOPY N/A 05/20/2015   Procedure: ESOPHAGOGASTRODUODENOSCOPY (EGD);  Surgeon: Charlott Rakes, MD;  Location: [facility] ENDOSCOPY;  Service: Endoscopy;  Laterality: N/A;   ESOPHAGOGASTRODUODENOSCOPY N/A 12/26/2015   Procedure: ESOPHAGOGASTRODUODENOSCOPY (EGD);  Surgeon: Corbin Ade, MD;  Location: [facility] END[facility];  Service: Endoscopy;  Laterality: N/A;   GIVENS CAPSULE STUDY N/A 12/24/2015   Procedure: GIVENS CAPSULE STUDY;  Surgeon: Malissa Hippo, MD;  Location: [facility] END[facility]ITE;  Service: Endoscopy;  Laterality: N/A;   LEFT HEART CATH AND CORS/GRAFTS ANGIOGRAPHY N/A 07/01/2017   Procedure: LEFT HEART CATH AND CORS/GRAFTS ANGIOGRAPHY;  Surgeon: Kathleene Hazel, MD;  Location: [facility] INVASIVE CV LAB;  Service: Cardiovascular;  Laterality: N/A;   LEFT HEART CATHETERIZATION WITH CORONARY ANGIOGRAM N/A 04/22/2014   Procedure: LEFT HEART CATHETERIZATION WITH CORONARY ANGIOGRAM;  Surgeon: Kathleene Hazel, MD;  Location: [facility] CATH LAB;  Service: Cardiovascular;  Laterality: N/A;   PERCUTANEOUS CORONARY STENT INTERVENTION (PCI-S) N/A 04/25/2014   Procedure: PERCUTANEOUS CORONARY STENT INTERVENTION (PCI-S);  Surgeon: Peter M Swaziland, MD;  Location: [facility] CATH LAB;  Service: Cardiovascular;  Laterality: N/A;   PERIPHERAL VASCULAR CATHETERIZATION  04/23/2016   Procedure: Thrombectomy;  Surgeon: Marykay Lex, MD;  Location: [facility] INVASIVE CV LAB;  Service: Cardiovascular;;    FAMILY HISTORY Family History  Problem Relation Age of Onset   Cancer Father        colon, age 7s   SOCIAL HISTORY Social History   Tobacco Use   Smoking status: Former    Packs/day: 0.50    Years: 50.00    Additional pack years: 0.00    Total pack years: 25.00    Types: Cigarettes    Start date: 01/08/1947    Quit date: 06/08/1999    Years since quitting: 23.2   Smokeless tobacco: Never  Vaping Use   Vaping Use: Never used  Substance Use Topics   Alcohol use: No    Alcohol/week: 0.0 standard drinks of alcohol    Comment: 07/01/2017 "used to drink beer; quit in 2001"   Drug use: No       OPHTHALMIC EXAM:  Base Eye Exam     Visual Acuity (Snellen - Linear)       Right Left   Dist cc 20/40 -2 20/40 -2    Correction: Glasses         Tonometry (Tonopen, 1:06 PM)       Right Left   Pressure 11 11         Pupils       Pupils Dark Light Shape React APD   Right PERRL 3 2 Round Slow None   Left PERRL 3 2 Round Slow None         Visual Fields       Left Right    Full Full         Extraocular Movement       Right Left    Full, Ortho Full, Ortho         Neuro/Psych     Oriented x3: Yes   Mood/Affect: Normal         Dilation     Both eyes: 2.5% Phenylephrine @ 1:06 PM           Slit Lamp and Fundus Exam     Slit Lamp Exam       Right Left   Lids/Lashes Dermatochalasis - upper lid, Meibomian gland dysfunction, mils Telangiectasia Dermatochalasis - upper lid, Meibomian gland dysfunction, mils Telangiectasia   Conjunctiva/Sclera nasal pingeucula nasal pingeucula   Cornea trace PEE, well healed cataract wound trace PEE, well healed cataract wound   Anterior Chamber deep and clear deep and clear   Iris Round and dilated Round and moderately dilated  Lens PC IOL in good position with open PC PC IOL in good position with open PC   Anterior Vitreous Vitreous syneresis Vitreous syneresis         Fundus Exam       Right Left    Disc Pink and Sharp mild Pallor, Sharp rim, +PPA   C/D Ratio  0.2   Macula Flat, Blunted foveal reflex central atrophy, Drusen, RPE mottling and clumping Blunted foveal reflex, Drusen, RPE mottling and clumping, mild ERM, No heme or edema   Vessels attenuated, mild tortuosity attenuated, mild tortuosity   Periphery Attached, mild reticular degeneration, mild peripheral drusen, No heme Attached, mild reticular degeneration, mild peripheral drusen, No heme           IMAGING AND PROCEDURES  Imaging and Procedures for 09/13/2022  OCT, Retina - OU - Both Eyes       Right Eye Quality was good. Central Foveal Thickness: 280. Progression has no prior data. Findings include normal foveal contour, no IRF, no SRF, retinal drusen , epiretinal membrane, outer retinal atrophy (Mild ERM, central ORA/GA).   Left Eye Quality was good. Central Foveal Thickness: 327. Progression has no prior data. Findings include normal foveal contour, no IRF, no SRF, retinal drusen , epiretinal membrane, macular pucker, pigment epithelial detachment (Mild ERM with early pucker greatest SN mac, scattered drusen).   Notes *Images captured and stored on drive  Diagnosis / Impression:  Non-exu ARMD OU OD: central ORA/GA; tr ERM OS: Mild ERM with early pucker greatest SN mac, scattered drusen  Clinical management:  See below  Abbreviations: NFP - Normal foveal profile. CME - cystoid macular edema. PED - pigment epithelial detachment. IRF - intraretinal fluid. SRF - subretinal fluid. EZ - ellipsoid zone. ERM - epiretinal membrane. ORA - outer retinal atrophy. ORT - outer retinal tubulation. SRHM - subretinal hyper-reflective material. IRHM - intraretinal hyper-reflective material            ASSESSMENT/PLAN:    ICD-10-CM   1. Intermediate stage nonexudative age-related macular degeneration of both eyes  H35.3132 OCT, Retina - OU - Both Eyes    2. Epiretinal membrane (ERM) of both eyes  H35.373     3. Essential  hypertension  I10     4. Hypertensive retinopathy of both eyes  H35.033     5. Pseudophakia, both eyes  Z96.1      Age related macular degeneration, non-exudative, both eyes  - OD with focal central ORA / GA  - OS intermediate stage  - The incidence, anatomy, and pathology of dry AMD, risk of progression, and the AREDS and AREDS 2 study including smoking risks discussed with patient.  - OCT shows OD: Mild ERM, central ORA; ZO:[id number]:scattered drusen  - Recommend amsler grid monitoring  - f/u 3-4 months, DFE, OCT  2. Epiretinal membrane, both eyes (OS>OD) - The natural history, anatomy, potential for loss of vision, and treatment options including vitrectomy techniques and the complications of endophthalmitis, retinal detachment, vitreous hemorrhage, cataract progression and permanent vision loss discussed with the patient. - mild ERM OU -- OS with early pucker - BCVA 20/40 OU - asymptomatic, no metamorphopsia - no indication for surgery at this time - monitor for now - f/u 3-4 mos -- DFE/OCT  3,4. Hypertensive retinopathy OU - discussed importance of tight BP control - monitor  5. Pseudophakia OU  - s/p CE/IOL OU  - IOLs in good position, doing well  - monitor  Ophthalmic Meds Ordered this visit:  No  orders of the defined types were placed in this encounter.    Return for f/u 3-4 months, non-exu ARMD OU, DFE, OCT.  There are no Patient Instructions on file for this visit.   Explained the diagnoses, plan, and follow up with the patient and they expressed understanding.  Patient expressed understanding of the importance of proper follow up care.   This document serves as a record of services personally performed by Karie Chimera, MD, PhD. It was created on their behalf by De Blanch, an ophthalmic technician. The creation of this record is the provider's dictation and/or activities during the visit.    Electronically signed by: De Blanch, OA, 09/13/22  5:40  PM  This document serves as a record of services personally performed by Karie Chimera, MD, PhD. It was created on their behalf by Glee Arvin. Manson Passey, OA an ophthalmic technician. The creation of this record is the provider's dictation and/or activities during the visit.    Electronically signed by: Glee Arvin. Manson Passey, [place] 04.08.2024 5:40 PM  Karie Chimera, M.D., Ph.D. Diseases & Surgery of the Retina and Vitreous [facility]  I have reviewed the above documentation for accuracy and completeness, and I agree with the above. Karie Chimera, M.D., Ph.D. 09/13/22 5:49 PM  Abbreviations: M myopia (nearsighted); A astigmatism; H hyperopia (farsighted); P presbyopia; Mrx spectacle prescription;  CTL contact lenses; OD right eye; OS left eye; OU both eyes  XT exotropia; ET esotropia; PEK punctate epithelial keratitis; PEE punctate epithelial erosions; DES dry eye syndrome; MGD meibomian gland dysfunction; ATs artificial tears; PFAT's preservative free artificial tears; NSC nuclear sclerotic cataract; PSC posterior subcapsular cataract; ERM epi-retinal membrane; PVD posterior vitreous detachment; RD retinal detachment; DM diabetes mellitus; DR diabetic retinopathy; NPDR non-proliferative diabetic retinopathy; PDR proliferative diabetic retinopathy; CSME clinically significant macular edema; DME diabetic macular edema; dbh dot blot hemorrhages; CWS cotton wool spot; POAG primary open angle glaucoma; C/D cup-to-disc ratio; HVF humphrey visual field; GVF goldmann visual field; OCT optical coherence tomography; IOP intraocular pressure; BRVO Branch retinal vein occlusion; CRVO central retinal vein occlusion; CRAO central retinal artery occlusion; BRAO branch retinal artery occlusion; RT retinal tear; SB scleral buckle; PPV pars plana vitrectomy; VH Vitreous hemorrhage; PRP panretinal laser photocoagulation; IVK intravitreal kenalog; VMT vitreomacular traction; MH Macular hole;  NVD  neovascularization of the disc; NVE neovascularization elsewhere; AREDS age related eye disease study; ARMD age related macular degeneration; POAG primary open angle glaucoma; EBMD epithelial/anterior basement membrane dystrophy; ACIOL anterior chamber intraocular lens; IOL intraocular lens; PCIOL posterior chamber intraocular lens; Phaco/IOL phacoemulsification with intraocular lens placement; PRK photorefractive keratectomy; LASIK laser assisted in situ keratomileusis; HTN hypertension; DM diabetes mellitus; COPD chronic obstructive pulmonary disease

## 2022-09-13 ENCOUNTER — Encounter (INDEPENDENT_AMBULATORY_CARE_PROVIDER_SITE_OTHER): Payer: Self-pay | Admitting: Ophthalmology

## 2022-09-13 ENCOUNTER — Ambulatory Visit (INDEPENDENT_AMBULATORY_CARE_PROVIDER_SITE_OTHER): Payer: 59 | Admitting: Ophthalmology

## 2022-09-13 DIAGNOSIS — I10 Essential (primary) hypertension: Secondary | ICD-10-CM

## 2022-09-13 DIAGNOSIS — Z961 Presence of intraocular lens: Secondary | ICD-10-CM

## 2022-09-13 DIAGNOSIS — H35033 Hypertensive retinopathy, bilateral: Secondary | ICD-10-CM | POA: Diagnosis not present

## 2022-09-13 DIAGNOSIS — H35373 Puckering of macula, bilateral: Secondary | ICD-10-CM | POA: Diagnosis not present

## 2022-09-13 DIAGNOSIS — H3581 Retinal edema: Secondary | ICD-10-CM

## 2022-09-13 DIAGNOSIS — H353132 Nonexudative age-related macular degeneration, bilateral, intermediate dry stage: Secondary | ICD-10-CM | POA: Diagnosis not present

## 2022-10-25 ENCOUNTER — Ambulatory Visit: Payer: 59 | Attending: Cardiology | Admitting: Cardiology

## 2022-10-25 ENCOUNTER — Encounter: Payer: Self-pay | Admitting: Cardiology

## 2022-10-25 VITALS — BP 122/62 | HR 82 | Ht 67.0 in | Wt 139.2 lb

## 2022-10-25 DIAGNOSIS — I5020 Unspecified systolic (congestive) heart failure: Secondary | ICD-10-CM | POA: Diagnosis not present

## 2022-10-25 DIAGNOSIS — E782 Mixed hyperlipidemia: Secondary | ICD-10-CM

## 2022-10-25 DIAGNOSIS — I25119 Atherosclerotic heart disease of native coronary artery with unspecified angina pectoris: Secondary | ICD-10-CM

## 2022-10-25 NOTE — Progress Notes (Signed)
Cardiology Office Note  Date: 10/25/2022   ID: Billy [name], DOB 10-15-1934, MRN [id number]  History of Present Illness: Billy [name] is an 88 y.o. male last seen in February.  He is here today with his wife for routine follow-up.  He does not describe any angina, stable NYHA class II dyspnea, no weight gain, no orthopnea or PND, no leg swelling.  Has had some trouble with allergy symptoms recently.  He did not tolerate the addition of Farxiga (outlined in interval telephone notes).  We went over his medications and plan to continue Toprol-XL and Entresto, he has not required a diuretic and blood pressure tends to be low normal at baseline.  He does not report any bleeding problems on aspirin and Plavix.  Physical Exam: VS:  BP 122/62   Pulse 82   Ht 5\' 7"  (1.702 m)   Wt 139 lb 3.2 oz (63.1 kg)   SpO2 98%   BMI 21.80 kg/m , BMI Body mass index is 21.8 kg/m.  Wt Readings from Last 3 Encounters:  10/25/22 139 lb 3.2 oz (63.1 kg)  07/14/22 140 lb (63.5 kg)  06/28/22 138 lb 9.6 oz (62.9 kg)    General: Patient appears comfortable at rest. HEENT: Conjunctiva and lids normal. Neck: Supple, no elevated JVP or carotid bruits. Lungs: Clear to auscultation, nonlabored breathing at rest. Cardiac: Regular rate and rhythm, no S3, 1/6 systolic murmur. Extremities: No pitting edema.  ECG:  An ECG dated 06/28/2022 was personally reviewed today and demonstrated:  Sinus rhythm with right bundle branch block, left anterior fascicular block rule out old inferior infarct pattern, PVC.  Labwork:  June 2023: Cholesterol 132, triglycerides 114, HDL 32, LDL 79 November 2023: Hemoglobin 12.7, platelets 247 February 2024: Potassium 4.4, BUN 21, creatinine 1.14  Other Studies Reviewed Today:  Echocardiogram 06/11/2022:  1. Left ventricular ejection fraction, by estimation, is 30 to 35%. The  left ventricle has moderately decreased function. The left ventricle  demonstrates regional wall motion  abnormalities (see scoring  diagram/findings for description). The left  ventricular internal cavity size was moderately dilated. Left ventricular  diastolic parameters are consistent with Grade I diastolic dysfunction  (impaired relaxation). The average left ventricular global longitudinal  strain is -13.7 %. The global  longitudinal strain is abnormal.   2. Right ventricular systolic function not well visualized but appears to  be mild to moderately reduced. The right ventricular size is mildly  enlarged.   3. Left atrial size was mildly dilated.   4. The mitral valve is grossly normal. Mild mitral valve regurgitation.  No evidence of mitral stenosis.   5. The aortic valve is tricuspid. There is mild calcification of the  aortic valve. Aortic valve regurgitation is mild. No aortic stenosis is  present.   Assessment and Plan:  1.  Multivessel CAD status post CABG in 2001 with LIMA to LAD, SVG to diagonal, SVG to OM1 and OM 2, and SVG to PDA.  He has undergone multiple subsequent PCI's including DES x 2 to the SVG to OM1 with staged DES to the SVG to RCA in 2015, DES to the SVG to PDA in 2016, DES x 2 to the SVG to OM1 and OM 2 in 2017, and DES x 2 to the mid and distal SVG to RCA in 2019.  He reports no active angina at this time on medical therapy.  No reported spontaneous bleeding problems on long-term dual antiplatelet therapy.  Continue aspirin, Plavix, Lipitor, Imdur,  and as needed nitroglycerin.  2.  HFrEF with ischemic cardiomyopathy, LVEF 30 to 35% by echocardiogram in January.  Did not tolerate Marcelline Deist as documented in telephone notes.  Continue Toprol-XL and Entresto, hold off on addition of diuretic for now.  3.  Asymptomatic carotid artery disease.  Carotid Dopplers in January revealed mild bilateral ICA stenosis.  Continue antiplatelet therapy and statin.  4.  Mixed hyperlipidemia.  LDL 79 in June 2023.  Currently on Lipitor.  5.  Essential hypertension.  Disposition:   Follow up  6 months.  Signed, Jonelle Sidle, M.D., F.A.C.C. [facility]

## 2022-10-25 NOTE — Patient Instructions (Addendum)

## 2022-10-26 ENCOUNTER — Other Ambulatory Visit: Payer: Self-pay | Admitting: Cardiology

## 2022-11-08 DIAGNOSIS — R5383 Other fatigue: Secondary | ICD-10-CM | POA: Diagnosis not present

## 2022-11-08 DIAGNOSIS — E039 Hypothyroidism, unspecified: Secondary | ICD-10-CM | POA: Diagnosis not present

## 2022-11-08 DIAGNOSIS — I10 Essential (primary) hypertension: Secondary | ICD-10-CM | POA: Diagnosis not present

## 2022-11-08 DIAGNOSIS — E781 Pure hyperglyceridemia: Secondary | ICD-10-CM | POA: Diagnosis not present

## 2022-11-15 DIAGNOSIS — R030 Elevated blood-pressure reading, without diagnosis of hypertension: Secondary | ICD-10-CM | POA: Diagnosis not present

## 2022-11-15 DIAGNOSIS — E039 Hypothyroidism, unspecified: Secondary | ICD-10-CM | POA: Diagnosis not present

## 2022-11-15 DIAGNOSIS — R7989 Other specified abnormal findings of blood chemistry: Secondary | ICD-10-CM | POA: Diagnosis not present

## 2022-11-15 DIAGNOSIS — M19032 Primary osteoarthritis, left wrist: Secondary | ICD-10-CM | POA: Diagnosis not present

## 2022-11-15 DIAGNOSIS — Z1389 Encounter for screening for other disorder: Secondary | ICD-10-CM | POA: Diagnosis not present

## 2022-11-15 DIAGNOSIS — E7849 Other hyperlipidemia: Secondary | ICD-10-CM | POA: Diagnosis not present

## 2022-11-15 DIAGNOSIS — I25812 Atherosclerosis of bypass graft of coronary artery of transplanted heart without angina pectoris: Secondary | ICD-10-CM | POA: Diagnosis not present

## 2022-11-15 DIAGNOSIS — Z6821 Body mass index (BMI) 21.0-21.9, adult: Secondary | ICD-10-CM | POA: Diagnosis not present

## 2022-11-15 DIAGNOSIS — I255 Ischemic cardiomyopathy: Secondary | ICD-10-CM | POA: Diagnosis not present

## 2022-11-15 DIAGNOSIS — K559 Vascular disorder of intestine, unspecified: Secondary | ICD-10-CM | POA: Diagnosis not present

## 2022-11-15 DIAGNOSIS — D62 Acute posthemorrhagic anemia: Secondary | ICD-10-CM | POA: Diagnosis not present

## 2022-12-19 ENCOUNTER — Other Ambulatory Visit: Payer: Self-pay | Admitting: Cardiology

## 2023-01-10 ENCOUNTER — Encounter (INDEPENDENT_AMBULATORY_CARE_PROVIDER_SITE_OTHER): Payer: 59 | Admitting: Ophthalmology

## 2023-01-10 DIAGNOSIS — H35033 Hypertensive retinopathy, bilateral: Secondary | ICD-10-CM

## 2023-01-10 DIAGNOSIS — H35373 Puckering of macula, bilateral: Secondary | ICD-10-CM

## 2023-01-10 DIAGNOSIS — I10 Essential (primary) hypertension: Secondary | ICD-10-CM

## 2023-01-10 DIAGNOSIS — Z961 Presence of intraocular lens: Secondary | ICD-10-CM

## 2023-01-10 DIAGNOSIS — H353132 Nonexudative age-related macular degeneration, bilateral, intermediate dry stage: Secondary | ICD-10-CM

## 2023-01-12 ENCOUNTER — Other Ambulatory Visit (HOSPITAL_COMMUNITY)
Admission: RE | Admit: 2023-01-12 | Discharge: 2023-01-12 | Disposition: A | Discharge status: Home / Self Care | Payer: 59 | Source: Ambulatory Visit | Attending: Student | Admitting: Student

## 2023-01-12 ENCOUNTER — Encounter: Payer: Self-pay | Admitting: *Deleted

## 2023-01-12 ENCOUNTER — Encounter: Payer: Self-pay | Admitting: Student

## 2023-01-12 ENCOUNTER — Ambulatory Visit: Payer: 59 | Attending: Student | Admitting: Student

## 2023-01-12 ENCOUNTER — Telehealth: Payer: Self-pay | Admitting: Cardiology

## 2023-01-12 VITALS — BP 140/84 | HR 85 | Ht 67.5 in | Wt 143.8 lb

## 2023-01-12 DIAGNOSIS — I25119 Atherosclerotic heart disease of native coronary artery with unspecified angina pectoris: Secondary | ICD-10-CM | POA: Diagnosis not present

## 2023-01-12 DIAGNOSIS — R0609 Other forms of dyspnea: Secondary | ICD-10-CM | POA: Insufficient documentation

## 2023-01-12 DIAGNOSIS — Z79899 Other long term (current) drug therapy: Secondary | ICD-10-CM | POA: Insufficient documentation

## 2023-01-12 DIAGNOSIS — I10 Essential (primary) hypertension: Secondary | ICD-10-CM | POA: Diagnosis not present

## 2023-01-12 DIAGNOSIS — E785 Hyperlipidemia, unspecified: Secondary | ICD-10-CM | POA: Diagnosis not present

## 2023-01-12 DIAGNOSIS — I5020 Unspecified systolic (congestive) heart failure: Secondary | ICD-10-CM

## 2023-01-12 LAB — BASIC METABOLIC PANEL
Anion gap: 9 (ref 5–15)
BUN: 26 mg/dL — ABNORMAL HIGH (ref 8–23)
CO2: 25 mmol/L (ref 22–32)
Calcium: 9.5 mg/dL (ref 8.9–10.3)
Chloride: 101 mmol/L (ref 98–111)
Creatinine, Ser: 1.24 mg/dL (ref 0.61–1.24)
GFR, Estimated: 57 mL/min — ABNORMAL LOW (ref >60)
Glucose, Bld: 116 mg/dL — ABNORMAL HIGH (ref 70–99)
Potassium: 4.3 mmol/L (ref 3.5–5.1)
Sodium: 135 mmol/L (ref 135–145)

## 2023-01-12 LAB — CBC
HCT: 35.4 % — ABNORMAL LOW (ref 39.0–52.0)
Hemoglobin: 11.6 g/dL — ABNORMAL LOW (ref 13.0–17.0)
MCH: 29.7 pg (ref 26.0–34.0)
MCHC: 32.8 g/dL (ref 30.0–36.0)
MCV: 90.5 fL (ref 80.0–100.0)
Platelets: 245 10*3/uL (ref 150–400)
RBC: 3.91 MIL/uL — ABNORMAL LOW (ref 4.22–5.81)
RDW: 12.9 % (ref 11.5–15.5)
WBC: 6.1 10*3/uL (ref 4.0–10.5)
nRBC: 0 % (ref 0.0–0.2)

## 2023-01-12 LAB — BRAIN NATRIURETIC PEPTIDE: B Natriuretic Peptide: 1738 pg/mL — ABNORMAL HIGH (ref 0.0–100.0)

## 2023-01-12 MED ORDER — FUROSEMIDE 20 MG PO TABS: 20.0000 mg | ORAL_TABLET | Freq: Every day | ORAL | 3 refills | Status: DC

## 2023-01-12 MED ORDER — ISOSORBIDE MONONITRATE ER 60 MG PO TB24: 60.0000 mg | ORAL_TABLET | Freq: Every day | ORAL | 3 refills | Status: DC

## 2023-01-12 NOTE — Progress Notes (Signed)
Cardiology Office Note    Date:  01/12/2023  ID:  Billy [name], DOB 15-Aug-1934, MRN [id number] Cardiologist: Nona Dell, MD    History of Present Illness:    Billy [name] is a 88 y.o. male with past medical history of CAD (s/p CABG in 2001, DESx2 to SVG-OM1 and staged DES to SVG-RCA in 04/2014, DES to SVG-PDA in 05/2015 and DESx2 to SVG-OM1-OM2 in 04/2016, cath in 06/2017 showing 99% stenosis of SVG-RCA and treated with DESx2), HFrEF/ischemic cardiomyopathy (EF 40-45% in 08/2017 and 08/2018, at 30-35% by echo in 06/2022), HTN, HLD, and GERD who presents to the office today as an add-on visit for evaluation of shortness of breath and shoulder pain.  He was examined by Dr. Diona Browner in 10/2022 and reported NYHA class II dyspnea but no specific orthopnea, PND or pitting edema. He did not tolerate Farxiga due to worsening confusion, dizziness and muscle pain. Therefore, he was continued on his current medical therapy with ASA 81 mg daily, Plavix 75 mg daily, Imdur 30 mg daily, Toprol-XL 25 mg daily and Entresto 24-26 mg twice daily.  He called the office today reporting worsening shortness of breath with exertion for the past week with no chest pain but did have discomfort in his neck and arms. He did report stopping Entresto on his own approximately 2 weeks ago because he felt dizzy with the medication but did not check his blood pressure at that time. A follow-up visit was therefore arranged.  In talking with the patient and his wife today, he reports he did stop Entresto approximately 2 weeks ago due to dizziness but did not take blood pressure at that time.  Reports his dizziness resolved with stopping this. Over the past few weeks, he has noted bilateral shoulder pain which radiates into his neck and jaw. Reports this resembles his prior angina and typically occurs with activity and improves with rest. He has only had to utilize nitroglycerin on one occasion and says this helped with symptoms. He  reports dyspnea on exertion, orthopnea and abdominal bloating as well. No specific pitting edema along his lower extremities.   Studies Reviewed:   EKG: EKG is ordered today and demonstrates:   EKG Interpretation Date/Time:  Wednesday January 12 2023 13:09:22 EDT Ventricular Rate:  85 PR Interval:  162 QRS Duration:  160 QT Interval:  432 QTC Calculation: 514 R Axis:   232  Text Interpretation: Sinus rhythm with frequent Premature ventricular complexes Right bundle branch block Inferior infarct pattern Confirmed by Randall An ([id number]) on 01/12/2023 3:22:38 PM       Cardiac Catheterization: 06/2017 Ost LM to Mid LM lesion is 50% stenosed. Ost LAD to Prox LAD lesion is 90% stenosed. LIMA graft was visualized by angiography and is normal in caliber. Ost Cx to Prox Cx lesion is 100% stenosed. SVG graft was visualized by angiography and is normal in caliber. Mid RCA lesion is 100% stenosed. SVG graft was visualized by angiography and is normal in caliber. Dist Graft to Insertion lesion is 99% stenosed. A drug-eluting stent was successfully placed using a STENT SYNERGY DES 3X16. Post intervention, there is a 0% residual stenosis. A drug-eluting stent was successfully placed. Mid Graft lesion is 70% stenosed. Post intervention, there is a 0% residual stenosis. Prox Graft to Dist Graft lesion before 1st Mrg is 30% stenosed. Mid Graft lesion between 1st Mrg and 2nd Mrg is 40% stenosed. Mid Graft lesion is 40% stenosed. Origin lesion is 30% stenosed. There is mild  to moderate left ventricular systolic dysfunction. LV end diastolic pressure is normal. The left ventricular ejection fraction is 35-45% by visual estimate.   1. Severe triple vessel CAD s/p 5 V CABG with 5/5 patent bypass grafts 2. The LAD has a high grade proximal stenosis, unchanged. The LIMA graft is patent to the mid LAD. The vein graft is patent to the Diagonal.  3. The Circumflex is occluded proximally. The vein  graft to the OM1/OM2 is patent. The stents in this vein graft have mild restenosis.  4. The RCA is occluded in the mid segment. The vein graft to the RCA has a moderately severe stenosis in the mid body of the graft and the distal stented segment of the graft has 99% stenosis.  5. Moderate LV systolic function, LVEF estimated at 40%.  6. Successful PTCA/DES x 2 mid and distal body of SVG to RCA. No distal protection used as the distal lesion as within the distal stent and there was no good landing zone.    Recommendations: Continue DAPT with ASA and Plavix, continue beta blocker and statin.   Echocardiogram: 06/2022 IMPRESSIONS     1. Left ventricular ejection fraction, by estimation, is 30 to 35%. The  left ventricle has moderately decreased function. The left ventricle  demonstrates regional wall motion abnormalities (see scoring  diagram/findings for description). The left  ventricular internal cavity size was moderately dilated. Left ventricular  diastolic parameters are consistent with Grade I diastolic dysfunction  (impaired relaxation). The average left ventricular global longitudinal  strain is -13.7 %. The global  longitudinal strain is abnormal.   2. Right ventricular systolic function not well visualized but appears to  be mild to moderately reduced. The right ventricular size is mildly  enlarged.   3. Left atrial size was mildly dilated.   4. The mitral valve is grossly normal. Mild mitral valve regurgitation.  No evidence of mitral stenosis.   5. The aortic valve is tricuspid. There is mild calcification of the  aortic valve. Aortic valve regurgitation is mild. No aortic stenosis is  present.   Comparison(s): Prior images reviewed side by side. Changes from prior  study are noted. LVEF worsened from 45% to 30-35% now. Stable mild AI.     Physical Exam:   VS:  BP (!) 140/84   Pulse 85   Ht 5' 7.5" (1.715 m)   Wt 143 lb 12.8 oz (65.2 kg)   SpO2 98%   BMI 22.19  kg/m    Wt Readings from Last 3 Encounters:  01/12/23 143 lb 12.8 oz (65.2 kg)  10/25/22 139 lb 3.2 oz (63.1 kg)  07/14/22 140 lb (63.5 kg)     GEN: Pleasant, elderly male appearing in no acute distress NECK: No JVD; No carotid bruits CARDIAC: RRR with frequent ectopic beats. rubs, gallops RESPIRATORY: Rales along bases bilaterally, more prominent along right-side.  ABDOMEN: Appears distended. No obvious abdominal masses. EXTREMITIES: No clubbing or cyanosis. Trace ankle edema bilaterally.  Distal pedal pulses are 2+ bilaterally.   Assessment and Plan:   1. Dyspnea on Exertion/Bilateral Shoulder Pain - His symptoms are concerning for accelerating angina as he reports exertional bilateral shoulder pain which radiates into his neck and jaws and improves with rest which resembles his prior angina. We discussed options today including a cardiac catheterization for definitive evaluation or medical management and he wishes to avoid a catheterization if able due to his age. Given that he does appear volume overloaded, will start Lasix 20 mg  daily and also increase Imdur to 60 mg daily. Recheck labs including CBC, BMET and BNP. They will call back to report on his symptoms next week and if this has not improved, he would consider a catheterization at that time. We did review precautions which would warrant Emergency Department evaluation in the interim.  2. Chronic HFrEF - Most recent echocardiogram showed his EF was reduced at 30 to 35% and he appears volume overloaded by examination today and his weight has increased by over 5 pounds. Will check labs today including a CBC, BMET and BNP.   - Will go ahead and start Lasix 20 mg daily and continue Toprol-XL 25 mg daily for now. He was previously intolerant to [place] as discussed above. At the time of follow-up, would recommend trying low-dose Losartan or Spironolactone if he is in agreement for medication adjustments.  3. CAD - He is  s/p CABG in 2001 with multiple interventions as discussed above with the most recent being in 06/2017 with DESx2 to SVG-RCA.  - As discussed above, his symptoms are concerning for angina. Will attempt medication titration initially as discussed but if symptoms do not improve, would recommend a cardiac catheterization for definitive evaluation as discussed with the patient and his wife today. For now, continue ASA 81 mg daily, Plavix 75 mg daily, Atorvastatin 40 mg daily, PRN SL NTG and Toprol-XL 25 mg daily. Will titrate Imdur from 30 mg daily to 60 mg daily.  4. HTN - BP is at 140/84 during today's visit. He did recently stop Entresto and does not wish to restart this given the dizziness he experienced with the medication. Will titrate Imdur as discussed above and start Lasix 20 mg daily. He also remains on Toprol-XL 25 mg daily.  5. HLD - Followed by PCP. LDL was at 79 in 2023. Will request a copy of his most recent labs.  He remains on Atorvastatin 40 mg daily and this can be further titrated if LDL is not at goal.   Signed, Ellsworth Lennox, PA-C

## 2023-01-12 NOTE — Telephone Encounter (Signed)
Spoke with wife Burna Mortimer) - states he has c/o SOB with exertion x 1 wk + No c/o chest pain but does mention pain in his neck & arms.  No dizziness.   Has not checked any weights or BP's. Stated that he stopped his Sherryll Burger own his own about 2 weeks ago because it was making him feel dizzy (did not check BP during that time).  No dizziness since stopping.    OV scheduled today with Randall An, PA in [place] at 1:00.  Informed to bring all medications to visit.

## 2023-01-12 NOTE — Telephone Encounter (Signed)
Pt c/o Shortness Of Breath: STAT if SOB developed within the last 24 hours or pt is noticeably SOB on the phone  1. Are you currently SOB (can you hear that pt is SOB on the phone)? No  2. How long have you been experiencing SOB? 3-4 days   3. Are you SOB when sitting or when up moving around? Moving around   4. Are you currently experiencing any other symptoms? Pain in arms and neck   Requesting an appt for today.

## 2023-01-12 NOTE — Patient Instructions (Signed)
Medication Instructions:   Start Lasix 20 mg Daily  Increase Imdur 60 mg Daily   Call and update our office on Monday or Tuesday with symptoms and weights.   *If you need a refill on your cardiac medications before your next appointment, please call your pharmacy*   Lab Work: Your physician recommends that you return for lab work in: Today   If you have labs (blood work) drawn today and your tests are completely normal, you will receive your results only by: MyChart Message (if you have MyChart) OR A paper copy in the mail If you have any lab test that is abnormal or we need to change your treatment, we will call you to review the results.   Testing/Procedures: NONE    Follow-Up: At [facility], you and your health needs are our priority.  As part of our continuing mission to provide you with exceptional heart care, we have created designated Provider Care Teams.  These Care Teams include your primary Cardiologist (physician) and Advanced Practice Providers (APPs -  Physician Assistants and Nurse Practitioners) who all work together to provide you with the care you need, when you need it.  We recommend signing up for the patient portal called "MyChart".  Sign up information is provided on this After Visit Summary.  MyChart is used to connect with patients for Virtual Visits (Telemedicine).  Patients are able to view lab/test results, encounter notes, upcoming appointments, etc.  Non-urgent messages can be sent to your provider as well.   To learn more about what you can do with MyChart, go to [email or web address].    Your next appointment:   4 week(s)  Provider:   Nona Dell, MD or Randall An, PA-C    Other Instructions Thank you for choosing [facility]!

## 2023-01-13 ENCOUNTER — Encounter: Payer: Self-pay | Admitting: Family Medicine

## 2023-01-18 ENCOUNTER — Telehealth: Payer: Self-pay | Admitting: Cardiology

## 2023-01-18 DIAGNOSIS — Z79899 Other long term (current) drug therapy: Secondary | ICD-10-CM

## 2023-01-18 NOTE — Telephone Encounter (Signed)
I will forward to B.Ahmed Prima, PA-C for review.

## 2023-01-18 NOTE — Telephone Encounter (Signed)
   If he is feeling better and no longer having chest pain, we can continue medical therapy for now. If he has recurrent pain, would need to consider a cardiac catheterization as discussed during his visit. Given the recent initiation of Lasix, I would recommend a follow-up BMET within the next week for reassessment of his kidney function and electrolytes.   Signed, Ellsworth Lennox, PA-C 01/18/2023, 6:56 PM Pager: [phone]

## 2023-01-18 NOTE — Telephone Encounter (Signed)
Patient's wife called to provide weight readings for the past week: 139 lbs,138 lbs, 138 lbs, 138 lbs, 140 lbs, 138 lbs  Patient's wife states [place], [place] requested this update during 8/07 appointment. She states patient is no longer in pain and she would like to review his medications.

## 2023-01-19 ENCOUNTER — Other Ambulatory Visit (HOSPITAL_COMMUNITY)
Admission: RE | Admit: 2023-01-19 | Discharge: 2023-01-19 | Disposition: A | Discharge status: Home / Self Care | Payer: 59 | Source: Ambulatory Visit | Attending: Student | Admitting: Student

## 2023-01-19 DIAGNOSIS — Z79899 Other long term (current) drug therapy: Secondary | ICD-10-CM | POA: Diagnosis not present

## 2023-01-19 LAB — BASIC METABOLIC PANEL
Anion gap: 9 (ref 5–15)
BUN: 27 mg/dL — ABNORMAL HIGH (ref 8–23)
CO2: 26 mmol/L (ref 22–32)
Calcium: 9.1 mg/dL (ref 8.9–10.3)
Chloride: 99 mmol/L (ref 98–111)
Creatinine, Ser: 1.38 mg/dL — ABNORMAL HIGH (ref 0.61–1.24)
GFR, Estimated: 50 mL/min — ABNORMAL LOW (ref >60)
Glucose, Bld: 119 mg/dL — ABNORMAL HIGH (ref 70–99)
Potassium: 4.3 mmol/L (ref 3.5–5.1)
Sodium: 134 mmol/L — ABNORMAL LOW (ref 135–145)

## 2023-01-19 NOTE — Telephone Encounter (Signed)
Spoke to patients wife who stated that patient was not currently having cp. Pt will continue Lasix as prescribed by provider. Pt will have lab work completed at [facility]. Pts wife had no further questions or concerns at this time.

## 2023-01-20 ENCOUNTER — Telehealth: Payer: Self-pay | Admitting: *Deleted

## 2023-01-20 DIAGNOSIS — Z79899 Other long term (current) drug therapy: Secondary | ICD-10-CM

## 2023-01-20 NOTE — Telephone Encounter (Signed)
-----   Message from Ellsworth Lennox sent at 01/19/2023  4:13 PM EDT ----- Please let the patient know that his potassium remains within a normal range. Sodium slightly low at 134. Kidney function has increased slightly with creatinine going from 1.24 to 1.38 but this was at 1.30 when checked in 11/2022 so not significantly above his baseline. Would recheck BNP and BMET again in 2-3 weeks for reassessment as we may need to reduce Lasix to every other day dosing.

## 2023-01-20 NOTE — Telephone Encounter (Signed)
Pt wife notified of test results and to have labs done the last week in August or the first week in September.

## 2023-01-22 DIAGNOSIS — R404 Transient alteration of awareness: Secondary | ICD-10-CM | POA: Diagnosis not present

## 2023-01-22 DIAGNOSIS — I499 Cardiac arrhythmia, unspecified: Secondary | ICD-10-CM | POA: Diagnosis not present

## 2023-01-22 DIAGNOSIS — I469 Cardiac arrest, cause unspecified: Secondary | ICD-10-CM | POA: Diagnosis not present

## 2023-01-22 DIAGNOSIS — I4901 Ventricular fibrillation: Secondary | ICD-10-CM | POA: Diagnosis not present

## 2023-01-22 DIAGNOSIS — R0689 Other abnormalities of breathing: Secondary | ICD-10-CM | POA: Diagnosis not present

## 2023-01-22 DIAGNOSIS — Z743 Need for continuous supervision: Secondary | ICD-10-CM | POA: Diagnosis not present

## 2023-02-06 DEATH — deceased

## 2023-03-11 ENCOUNTER — Ambulatory Visit: Payer: 59 | Admitting: Student

## 2023-05-03 ENCOUNTER — Ambulatory Visit: Payer: 59 | Admitting: Cardiology
